# Patient Record
Sex: Female | Born: 1990 | Smoking: Current every day smoker
Health system: Southern US, Academic
[De-identification: ages and names within clinical notes are randomized; demographics above are authoritative.]

## PROBLEM LIST (undated history)

## (undated) DIAGNOSIS — K219 Gastro-esophageal reflux disease without esophagitis: Secondary | ICD-10-CM

## (undated) DIAGNOSIS — F1110 Opioid abuse, uncomplicated: Secondary | ICD-10-CM

## (undated) DIAGNOSIS — D509 Iron deficiency anemia, unspecified: Secondary | ICD-10-CM

## (undated) DIAGNOSIS — F429 Obsessive-compulsive disorder, unspecified: Secondary | ICD-10-CM

## (undated) DIAGNOSIS — F32A Depression, unspecified: Secondary | ICD-10-CM

## (undated) DIAGNOSIS — F1990 Other psychoactive substance use, unspecified, uncomplicated: Secondary | ICD-10-CM

## (undated) DIAGNOSIS — F329 Major depressive disorder, single episode, unspecified: Secondary | ICD-10-CM

## (undated) DIAGNOSIS — B1920 Unspecified viral hepatitis C without hepatic coma: Secondary | ICD-10-CM

## (undated) DIAGNOSIS — E109 Type 1 diabetes mellitus without complications: Secondary | ICD-10-CM

## (undated) DIAGNOSIS — R011 Cardiac murmur, unspecified: Secondary | ICD-10-CM

## (undated) HISTORY — DX: Type 1 diabetes mellitus without complications (CMS HCC): E10.9

## (undated) HISTORY — DX: Cardiac murmur, unspecified: R01.1

---
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
## 2008-01-05 ENCOUNTER — Ambulatory Visit: Payer: Self-pay | Admitting: "Endocrinology

## 2008-04-11 ENCOUNTER — Ambulatory Visit: Payer: Self-pay | Admitting: "Endocrinology

## 2008-08-11 ENCOUNTER — Ambulatory Visit: Payer: Self-pay | Admitting: "Endocrinology

## 2008-08-15 ENCOUNTER — Emergency Department (HOSPITAL_COMMUNITY): Admission: EM | Admit: 2008-08-15 | Discharge: 2008-08-15 | Payer: Self-pay | Admitting: Emergency Medicine

## 2009-03-14 ENCOUNTER — Ambulatory Visit: Payer: Self-pay | Admitting: Occupational Medicine

## 2009-03-14 DIAGNOSIS — E1065 Type 1 diabetes mellitus with hyperglycemia: Secondary | ICD-10-CM | POA: Insufficient documentation

## 2009-03-14 DIAGNOSIS — E1069 Type 1 diabetes mellitus with other specified complication: Secondary | ICD-10-CM

## 2009-03-14 DIAGNOSIS — F32A Depression, unspecified: Secondary | ICD-10-CM | POA: Insufficient documentation

## 2009-03-14 DIAGNOSIS — F329 Major depressive disorder, single episode, unspecified: Secondary | ICD-10-CM | POA: Insufficient documentation

## 2009-03-14 DIAGNOSIS — R209 Unspecified disturbances of skin sensation: Secondary | ICD-10-CM

## 2009-03-14 DIAGNOSIS — I10 Essential (primary) hypertension: Secondary | ICD-10-CM | POA: Insufficient documentation

## 2009-03-14 DIAGNOSIS — S93409A Sprain of unspecified ligament of unspecified ankle, initial encounter: Secondary | ICD-10-CM | POA: Insufficient documentation

## 2010-01-13 ENCOUNTER — Emergency Department (HOSPITAL_COMMUNITY)
Admission: EM | Admit: 2010-01-13 | Discharge: 2010-01-13 | Payer: Self-pay | Source: Home / Self Care | Admitting: Emergency Medicine

## 2010-01-15 ENCOUNTER — Emergency Department (HOSPITAL_COMMUNITY)
Admission: EM | Admit: 2010-01-15 | Discharge: 2010-01-15 | Payer: Self-pay | Source: Home / Self Care | Admitting: Emergency Medicine

## 2010-01-18 ENCOUNTER — Ambulatory Visit: Payer: Self-pay | Admitting: "Endocrinology

## 2010-02-27 NOTE — Assessment & Plan Note (Signed)
Summary: LEFT FOOT NUMB/KH   Vital Signs:  Patient Profile:   20 Years Old Female CC:      right ankle injury and left foor numbness Height:     67 inches Weight:      178 pounds O2 Sat:      100 % O2 treatment:    Room Air Temp:     97.3 degrees F oral Pulse rate:   92 / minute Pulse rhythm:   regular Resp:     18 per minute BP sitting:   133 / 84  (right arm)  Pt. in pain?   yes    Location:   right ankle    Intensity:   4    Type:       sharp  Vitals Entered By: Lajean Saver, RN                   Updated Prior Medication List: LEXAPRO 20 MG TABS (ESCITALOPRAM OXALATE) once daily ABILIFY 2 MG TABS (ARIPIPRAZOLE) once daily ORTHO TRI-CYCLEN (28) 0.18/0.215/0.25 MG-35 MCG TABS (NORGESTIM-ETH ESTRAD TRIPHASIC) once daily VYVANSE 60 MG CAPS (LISDEXAMFETAMINE DIMESYLATE) once daily OMEPRAZOLE 40 MG CPDR (OMEPRAZOLE) once daily LISINOPRIL 20 MG TABS (LISINOPRIL) once daily APIDRA 100 UNIT/ML SOLN (INSULIN GLULISINE)   Current Allergies: No known allergies History of Present Illness Chief Complaint: right ankle injury and left foor numbness History of Present Illness: Presents with complaints of numbness on the plantar surface of her left loot for three days.  No complaints of pain.   She does have a tingling feeling on the dorsum of her left foot though.    Says that she was sitting for a couple of hours with her legs crossed for a couple of hours and later noted the left foot numbness.   Initally the entire foor was numb, but the feeling the the dorsum of the foot has returned.   As a result of the left foot numbness, she accidentally sprained her right ankle.   Right ankle is mildly sore and swollen.  She is able to walk.   She has not tried any ibuprofen and has not wrapped her ankle.    REVIEW OF SYSTEMS Constitutional Symptoms      Denies fever, chills, night sweats, weight loss, weight gain, and fatigue.  Eyes       Denies change in vision, eye pain, eye  discharge, glasses, contact lenses, and eye surgery. Ear/Nose/Throat/Mouth       Denies hearing loss/aids, change in hearing, ear pain, ear discharge, dizziness, frequent runny nose, frequent nose bleeds, sinus problems, sore throat, hoarseness, and tooth pain or bleeding.  Respiratory       Denies dry cough, productive cough, wheezing, shortness of breath, asthma, bronchitis, and emphysema/COPD.  Cardiovascular       Denies murmurs, chest pain, and tires easily with exhertion.    Gastrointestinal       Denies stomach pain, nausea/vomiting, diarrhea, constipation, blood in bowel movements, and indigestion. Genitourniary       Denies painful urination, kidney stones, and loss of urinary control. Neurological       Complains of numbness and tingling.      Denies paralysis, seizures, and fainting/blackouts.      Comments: left foot Musculoskeletal       Complains of muscle pain, joint pain, decreased range of motion, and swelling.      Denies joint stiffness, redness, muscle weakness, and gout.      Comments: right ankle Skin  Denies bruising, unusual mles/lumps or sores, and hair/skin or nail changes.  Psych       Denies mood changes, temper/anger issues, anxiety/stress, speech problems, depression, and sleep problems.  Past History:  Past Medical History: ADD OCD Depression Diabetes mellitus, type I Hypertension  Past Surgical History: Denies surgical history  Social History: Current Smoker- few cigs/day Alcohol use-yes Drug use-no Smoking Status:  current Drug Use:  no Physical Exam General appearance: well developed, well nourished, no acute distress Chest/Lungs: no rales, wheezes, or rhonchi bilateral, breath sounds equal without effort Heart: regular rate and  rhythm, no murmur Right ankle: mildly swollen.  Minimal tenderness in lateral malloelus.  Stable.  Able to walk withou a limp Left foot.    decreased sensation on the plantar surface.  Intact sensation  dorsally   Good peripheral pulses.  Good cap refill.   Assessment New Problems: PARESTHESIA (ICD-782.0) ANKLE SPRAIN, RIGHT (ICD-845.00) HYPERTENSION (ICD-401.9) DIABETES MELLITUS, TYPE I (ICD-250.01) DEPRESSION (ICD-311)   Plan New Orders: Est. Patient Level III [99213] Planning Comments:   I think the pressure of prolonged sitting gave her a temporary compression neuropathy tot he left foot Advised waiting and watching.    Recommend follow up with PCP if sensation does not return in 2 weeks ACE wrap to right ankle Ibuprfen as needed.   The patient and/or caregiver has been counseled thoroughly with regard to medications prescribed including dosage, schedule, interactions, rationale for use, and possible side effects and they verbalize understanding.  Diagnoses and expected course of recovery discussed and will return if not improved as expected or if the condition worsens. Patient and/or caregiver verbalized understanding.

## 2010-04-09 LAB — WOUND CULTURE

## 2010-05-02 ENCOUNTER — Emergency Department (HOSPITAL_COMMUNITY)
Admission: EM | Admit: 2010-05-02 | Discharge: 2010-05-03 | Disposition: A | Discharge status: Home / Self Care | Payer: Self-pay | Attending: Emergency Medicine | Admitting: Emergency Medicine

## 2010-05-02 DIAGNOSIS — R509 Fever, unspecified: Secondary | ICD-10-CM | POA: Insufficient documentation

## 2010-05-02 DIAGNOSIS — M542 Cervicalgia: Secondary | ICD-10-CM | POA: Insufficient documentation

## 2010-05-02 DIAGNOSIS — D72819 Decreased white blood cell count, unspecified: Secondary | ICD-10-CM | POA: Insufficient documentation

## 2010-05-02 DIAGNOSIS — R000 Tachycardia, unspecified: Secondary | ICD-10-CM | POA: Insufficient documentation

## 2010-05-02 DIAGNOSIS — B9789 Other viral agents as the cause of diseases classified elsewhere: Secondary | ICD-10-CM | POA: Insufficient documentation

## 2010-05-02 DIAGNOSIS — IMO0001 Reserved for inherently not codable concepts without codable children: Secondary | ICD-10-CM | POA: Insufficient documentation

## 2010-05-02 DIAGNOSIS — R51 Headache: Secondary | ICD-10-CM | POA: Insufficient documentation

## 2010-05-02 DIAGNOSIS — R112 Nausea with vomiting, unspecified: Secondary | ICD-10-CM | POA: Insufficient documentation

## 2010-05-02 DIAGNOSIS — Z8619 Personal history of other infectious and parasitic diseases: Secondary | ICD-10-CM | POA: Insufficient documentation

## 2010-05-02 DIAGNOSIS — E119 Type 2 diabetes mellitus without complications: Secondary | ICD-10-CM | POA: Insufficient documentation

## 2010-05-02 DIAGNOSIS — R42 Dizziness and giddiness: Secondary | ICD-10-CM | POA: Insufficient documentation

## 2010-05-02 DIAGNOSIS — R6883 Chills (without fever): Secondary | ICD-10-CM | POA: Insufficient documentation

## 2010-05-02 DIAGNOSIS — Z794 Long term (current) use of insulin: Secondary | ICD-10-CM | POA: Insufficient documentation

## 2010-05-02 LAB — CBC
MCH: 30 pg (ref 26.0–34.0)
WBC: 2.1 10*3/uL — ABNORMAL LOW (ref 4.0–10.5)

## 2010-05-03 ENCOUNTER — Emergency Department (HOSPITAL_COMMUNITY): Payer: Self-pay

## 2010-05-03 LAB — CSF CELL COUNT WITH DIFFERENTIAL
RBC Count, CSF: 22 /mm3 — ABNORMAL HIGH
Tube #: 4
WBC, CSF: 1 /mm3 (ref 0–5)
WBC, CSF: 3 /mm3 (ref 0–5)

## 2010-05-03 LAB — DIFFERENTIAL
Basophils Relative: 0 % (ref 0–1)
Eosinophils Absolute: 0 10*3/uL (ref 0.0–0.7)
Eosinophils Relative: 1 % (ref 0–5)
Lymphocytes Relative: 64 % — ABNORMAL HIGH (ref 12–46)
Lymphs Abs: 1.4 10*3/uL (ref 0.7–4.0)
Monocytes Absolute: 0 10*3/uL — ABNORMAL LOW (ref 0.1–1.0)
Monocytes Relative: 0 % — ABNORMAL LOW (ref 3–12)
Neutro Abs: 0.7 10*3/uL — ABNORMAL LOW (ref 1.7–7.7)
Neutrophils Relative %: 35 % — ABNORMAL LOW (ref 43–77)
Smear Review: ADEQUATE

## 2010-05-03 LAB — URINALYSIS, ROUTINE W REFLEX MICROSCOPIC
Ketones, ur: 15 mg/dL — AB
Urobilinogen, UA: 0.2 mg/dL (ref 0.0–1.0)
pH: 6 (ref 5.0–8.0)

## 2010-05-03 LAB — BASIC METABOLIC PANEL
BUN: 14 mg/dL (ref 6–23)
CO2: 25 mEq/L (ref 19–32)
GFR calc non Af Amer: >60 mL/min (ref >60)
Potassium: 3.4 mEq/L — ABNORMAL LOW (ref 3.5–5.1)
Sodium: 136 mEq/L (ref 135–145)

## 2010-05-03 LAB — GLUCOSE, CAPILLARY
Glucose-Capillary: 101 mg/dL — ABNORMAL HIGH (ref 70–99)
Glucose-Capillary: 89 mg/dL (ref 70–99)

## 2010-05-03 LAB — GRAM STAIN

## 2010-05-05 ENCOUNTER — Emergency Department (HOSPITAL_COMMUNITY): Payer: Self-pay

## 2010-05-05 ENCOUNTER — Emergency Department (HOSPITAL_COMMUNITY)
Admission: EM | Admit: 2010-05-05 | Discharge: 2010-05-05 | Disposition: A | Discharge status: Home / Self Care | Payer: Self-pay | Attending: Emergency Medicine | Admitting: Emergency Medicine

## 2010-05-05 DIAGNOSIS — E119 Type 2 diabetes mellitus without complications: Secondary | ICD-10-CM | POA: Insufficient documentation

## 2010-05-05 DIAGNOSIS — G971 Other reaction to spinal and lumbar puncture: Secondary | ICD-10-CM | POA: Insufficient documentation

## 2010-05-05 DIAGNOSIS — R079 Chest pain, unspecified: Secondary | ICD-10-CM | POA: Insufficient documentation

## 2010-05-05 DIAGNOSIS — Y929 Unspecified place or not applicable: Secondary | ICD-10-CM | POA: Insufficient documentation

## 2010-05-05 DIAGNOSIS — Y844 Aspiration of fluid as the cause of abnormal reaction of the patient, or of later complication, without mention of misadventure at the time of the procedure: Secondary | ICD-10-CM | POA: Insufficient documentation

## 2010-05-06 LAB — POCT I-STAT 3, VENOUS BLOOD GAS (G3P V)
Acid-base deficit: 10 mmol/L — ABNORMAL HIGH (ref 0.0–2.0)
Bicarbonate: 16.1 meq/L — ABNORMAL LOW (ref 20.0–24.0)
O2 Saturation: 82 %
TCO2: 17 mmol/L (ref 0–100)
pCO2, Ven: 36.7 mmHg — ABNORMAL LOW (ref 45.0–50.0)
pH, Ven: 7.251 (ref 7.250–7.300)
pO2, Ven: 53 mmHg — ABNORMAL HIGH (ref 30.0–45.0)

## 2010-05-06 LAB — CSF CULTURE W GRAM STAIN

## 2010-05-06 LAB — GLUCOSE, CAPILLARY
Glucose-Capillary: 250 mg/dL — ABNORMAL HIGH (ref 70–99)
Glucose-Capillary: >600 mg/dL (ref 70–99)

## 2010-05-06 LAB — POCT I-STAT, CHEM 8
BUN: 22 mg/dL (ref 6–23)
Calcium, Ion: 1.15 mmol/L (ref 1.12–1.32)
Chloride: 100 mEq/L (ref 96–112)
Creatinine, Ser: 0.9 mg/dL (ref 0.4–1.2)
Glucose, Bld: >700 mg/dL (ref 70–99)
HCT: 41 % (ref 36.0–46.0)
Hemoglobin: 13.9 g/dL (ref 12.0–15.0)
Potassium: 4.8 mEq/L (ref 3.5–5.1)
Sodium: 129 meq/L — ABNORMAL LOW (ref 135–145)
TCO2: 14 mmol/L (ref 0–100)

## 2010-05-06 LAB — POCT PREGNANCY, URINE: Preg Test, Ur: NEGATIVE

## 2010-05-06 LAB — URINALYSIS, ROUTINE W REFLEX MICROSCOPIC
Hgb urine dipstick: NEGATIVE
Leukocytes, UA: NEGATIVE
Nitrite: NEGATIVE
Urobilinogen, UA: 0.2 mg/dL (ref 0.0–1.0)

## 2010-05-06 LAB — URINE MICROSCOPIC-ADD ON

## 2010-05-06 LAB — GLUCOSE, RANDOM
Glucose, Bld: 579 mg/dL (ref 70–99)
Glucose, Bld: 809 mg/dL (ref 70–99)

## 2010-05-09 LAB — CULTURE, BLOOD (ROUTINE X 2)
Culture  Setup Time: 201204050508
Culture: NO GROWTH
Culture: NO GROWTH

## 2010-05-22 ENCOUNTER — Ambulatory Visit (INDEPENDENT_AMBULATORY_CARE_PROVIDER_SITE_OTHER): Payer: Self-pay | Admitting: "Endocrinology

## 2010-05-22 DIAGNOSIS — E049 Nontoxic goiter, unspecified: Secondary | ICD-10-CM

## 2010-05-22 DIAGNOSIS — E1065 Type 1 diabetes mellitus with hyperglycemia: Secondary | ICD-10-CM

## 2010-05-22 DIAGNOSIS — E1142 Type 2 diabetes mellitus with diabetic polyneuropathy: Secondary | ICD-10-CM

## 2010-05-22 DIAGNOSIS — E1069 Type 1 diabetes mellitus with other specified complication: Secondary | ICD-10-CM

## 2010-05-27 ENCOUNTER — Emergency Department (HOSPITAL_COMMUNITY)
Admission: EM | Admit: 2010-05-27 | Discharge: 2010-05-28 | Disposition: A | Discharge status: Home / Self Care | Payer: Self-pay | Attending: Emergency Medicine | Admitting: Emergency Medicine

## 2010-05-27 DIAGNOSIS — Z760 Encounter for issue of repeat prescription: Secondary | ICD-10-CM | POA: Insufficient documentation

## 2010-05-27 DIAGNOSIS — Z794 Long term (current) use of insulin: Secondary | ICD-10-CM | POA: Insufficient documentation

## 2010-05-27 DIAGNOSIS — E119 Type 2 diabetes mellitus without complications: Secondary | ICD-10-CM | POA: Insufficient documentation
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
## 2010-06-15 NOTE — Discharge Summary (Signed)
NAME:  Anna Dennis, Anna Dennis NO.:  [id number]   MEDICAL RECORD NO.:  [id number]          PATIENT TYPE:  INP   LOCATION:  6126                         FACILITY:  [facility]   PHYSICIAN:  Rolm Gala, M.D.    DATE OF BIRTH:  07-27-1990   DATE OF ADMISSION:  06/16/2005  DATE OF DISCHARGE:  06/18/2005                                 DISCHARGE SUMMARY   DISCHARGE DIAGNOSES:  Insulin dependent diabetes.   MEDICATIONS:  1.  Lantus 38 units q.h.s.  2.  NovoLog sliding scale insulin.   OPERATIVE PROCEDURES:  Insulin drip.   HOSPITAL COURSE:  The patient is a 20 year old female, who was in good  control of her diabetes, but had stopped taking her insulin and went into  DKA.  She was initially put on an insulin drip, given IV fluids, and then  was switched to her Lantus.  The patient came out of the PICU on 06/17/2005.  She was ketones-negative and her potassium and sodium were within normal  limits.  The patient was given sliding scale insulin and Dr. Fransico Michael was  following her and her Lantus was continued at 38 units q.h.s.  The patient  was discharged on her home insulin medication after diabetic teaching.   FOLLOWUP:  The patient will follow up with Dr. Fransico Michael and her doctor at  [facility].  She will also follow up with family services at [facility] weekly and at  the [facility].   DISCHARGE LABS:  Sodium 140, potassium 3.2, chloride 115, bicarb 19, BUN 14,  creatinine 0.7, glucose 197, calcium 8.2, anion gap 6.      Rolm Gala, M.D.     Anna Dennis  D:  06/18/2005  T:  06/18/2005  Job:  [id number]

## 2010-06-15 NOTE — Consult Note (Signed)
NAME:  Anna Dennis, Anna Dennis NO.:  [id number]   MEDICAL RECORD NO.:  [id number]          PATIENT TYPE:  INP   LOCATION:  6126                         FACILITY:  [facility]   PHYSICIAN:  David Stall, M.D.DATE OF BIRTH:  May 07, 1990   DATE OF CONSULTATION:  06/17/2005  DATE OF DISCHARGE:                                   CONSULTATION   PEDIATRIC ENDOCRINE CONSULTATION:   CHIEF COMPLAINT:  Please assist in the management of this patient, known to  you with type 1 diabetes and new episode of diabetic ketoacidosis.   Dictation ended at this point.           ______________________________  David Stall, M.D.     MJB/MEDQ  D:  06/18/2005  T:  06/18/2005  Job:  [id number]

## 2010-07-13 ENCOUNTER — Encounter: Payer: Self-pay | Admitting: Pediatrics

## 2010-09-19 ENCOUNTER — Ambulatory Visit: Payer: Self-pay | Admitting: "Endocrinology

## 2010-10-22 LAB — HEPATIC FUNCTION PANEL
Albumin: 3.5
Bilirubin, Direct: <0.1
Total Bilirubin: 0.9

## 2010-10-22 LAB — LIPID PANEL
HDL: 39
LDL Cholesterol: 45
Total CHOL/HDL Ratio: 2.5
Triglycerides: 64
VLDL: 13

## 2010-10-22 LAB — URINALYSIS, ROUTINE W REFLEX MICROSCOPIC
Nitrite: NEGATIVE
Protein, ur: NEGATIVE
Urobilinogen, UA: 0.2

## 2010-10-22 LAB — BENZODIAZEPINE, QUANTITATIVE, URINE: Alprazolam (GC/LC/MS), ur confirm: 220 ng/mL

## 2010-10-22 LAB — CBC
MCHC: 34.1
MCV: 87.3
RBC: 3.95

## 2010-10-22 LAB — BASIC METABOLIC PANEL
BUN: 10
CO2: 28
Chloride: 105
Creatinine, Ser: 0.7
GFR calc Af Amer: >60

## 2010-10-22 LAB — DRUGS OF ABUSE SCREEN W/O ALC, ROUTINE URINE
Amphetamine Screen, Ur: NEGATIVE
Benzodiazepines.: POSITIVE — AB
Creatinine,U: 230.9
Methadone: NEGATIVE
Phencyclidine (PCP): NEGATIVE

## 2010-10-22 LAB — DIFFERENTIAL
Basophils Relative: 0
Eosinophils Absolute: 0.1
Eosinophils Relative: 2
Monocytes Relative: 6
Neutrophils Relative %: 53

## 2010-10-22 LAB — GAMMA GT: GGT: 16

## 2010-10-22 LAB — GC/CHLAMYDIA PROBE AMP, URINE: Chlamydia, Swab/Urine, PCR: NEGATIVE

## 2010-12-03 ENCOUNTER — Emergency Department (HOSPITAL_COMMUNITY)
Admission: EM | Admit: 2010-12-03 | Discharge: 2010-12-03 | Discharge status: Against Medical Advice | Payer: Self-pay | Attending: Emergency Medicine | Admitting: Emergency Medicine

## 2010-12-03 ENCOUNTER — Encounter (HOSPITAL_COMMUNITY): Payer: Self-pay

## 2010-12-03 ENCOUNTER — Emergency Department (HOSPITAL_COMMUNITY): Payer: Self-pay

## 2010-12-03 ENCOUNTER — Emergency Department (HOSPITAL_COMMUNITY)
Admission: EM | Admit: 2010-12-03 | Discharge: 2010-12-04 | Disposition: A | Discharge status: Home / Self Care | Payer: Self-pay | Attending: Emergency Medicine | Admitting: Emergency Medicine

## 2010-12-03 ENCOUNTER — Encounter: Payer: Self-pay | Admitting: *Deleted

## 2010-12-03 DIAGNOSIS — E119 Type 2 diabetes mellitus without complications: Secondary | ICD-10-CM | POA: Insufficient documentation

## 2010-12-03 DIAGNOSIS — R112 Nausea with vomiting, unspecified: Secondary | ICD-10-CM | POA: Insufficient documentation

## 2010-12-03 DIAGNOSIS — B1920 Unspecified viral hepatitis C without hepatic coma: Secondary | ICD-10-CM | POA: Insufficient documentation

## 2010-12-03 DIAGNOSIS — E109 Type 1 diabetes mellitus without complications: Secondary | ICD-10-CM

## 2010-12-03 DIAGNOSIS — M545 Low back pain, unspecified: Secondary | ICD-10-CM | POA: Insufficient documentation

## 2010-12-03 DIAGNOSIS — Z794 Long term (current) use of insulin: Secondary | ICD-10-CM | POA: Insufficient documentation

## 2010-12-03 DIAGNOSIS — R109 Unspecified abdominal pain: Secondary | ICD-10-CM | POA: Insufficient documentation

## 2010-12-03 DIAGNOSIS — N201 Calculus of ureter: Secondary | ICD-10-CM | POA: Insufficient documentation

## 2010-12-03 DIAGNOSIS — Z9641 Presence of insulin pump (external) (internal): Secondary | ICD-10-CM | POA: Insufficient documentation

## 2010-12-03 DIAGNOSIS — Z8619 Personal history of other infectious and parasitic diseases: Secondary | ICD-10-CM | POA: Insufficient documentation

## 2010-12-03 DIAGNOSIS — N200 Calculus of kidney: Secondary | ICD-10-CM

## 2010-12-03 HISTORY — DX: Unspecified viral hepatitis C without hepatic coma: B19.20

## 2010-12-03 LAB — DIFFERENTIAL
Basophils Absolute: 0 K/uL (ref 0.0–0.1)
Basophils Relative: 0 % (ref 0–1)
Eosinophils Absolute: 0 K/uL (ref 0.0–0.7)
Eosinophils Relative: 0 % (ref 0–5)
Lymphocytes Relative: 6 % — ABNORMAL LOW (ref 12–46)
Lymphs Abs: 1 K/uL (ref 0.7–4.0)
Monocytes Absolute: 0.9 K/uL (ref 0.1–1.0)
Monocytes Relative: 5 % (ref 3–12)
Neutro Abs: 16.7 K/uL — ABNORMAL HIGH (ref 1.7–7.7)
Neutrophils Relative %: 90 % — ABNORMAL HIGH (ref 43–77)

## 2010-12-03 LAB — URINE MICROSCOPIC-ADD ON

## 2010-12-03 LAB — BASIC METABOLIC PANEL WITH GFR
BUN: 12 mg/dL (ref 6–23)
CO2: 20 meq/L (ref 19–32)
Calcium: 9.4 mg/dL (ref 8.4–10.5)
Chloride: 96 meq/L (ref 96–112)
Creatinine, Ser: 0.81 mg/dL (ref 0.50–1.10)
GFR calc Af Amer: >90 mL/min
GFR calc non Af Amer: >90 mL/min
Glucose, Bld: 488 mg/dL — ABNORMAL HIGH (ref 70–99)
Potassium: 4.1 meq/L (ref 3.5–5.1)
Sodium: 133 meq/L — ABNORMAL LOW (ref 135–145)

## 2010-12-03 LAB — WET PREP, GENITAL
Trich, Wet Prep: NONE SEEN
Yeast Wet Prep HPF POC: NONE SEEN

## 2010-12-03 LAB — CBC
HCT: 35.5 % — ABNORMAL LOW (ref 36.0–46.0)
Hemoglobin: 12.1 g/dL (ref 12.0–15.0)
MCH: 29.4 pg (ref 26.0–34.0)
MCHC: 34.1 g/dL (ref 30.0–36.0)
MCV: 86.4 fL (ref 78.0–100.0)
Platelets: 304 K/uL (ref 150–400)
RBC: 4.11 MIL/uL (ref 3.87–5.11)
RDW: 13 % (ref 11.5–15.5)
WBC: 18.6 K/uL — ABNORMAL HIGH (ref 4.0–10.5)

## 2010-12-03 LAB — URINALYSIS, ROUTINE W REFLEX MICROSCOPIC
Bilirubin Urine: NEGATIVE
Hgb urine dipstick: NEGATIVE
Protein, ur: NEGATIVE mg/dL
Urobilinogen, UA: 0.2 mg/dL (ref 0.0–1.0)

## 2010-12-03 MED ORDER — ONDANSETRON HCL 4 MG/2ML IJ SOLN
4.0000 mg | Freq: Once | INTRAMUSCULAR | Status: AC
  Administered 2010-12-03: 4 mg via INTRAVENOUS
  Filled 2010-12-03: qty 2

## 2010-12-03 MED ORDER — SODIUM CHLORIDE 0.9 % IV SOLN
Freq: Once | INTRAVENOUS | Status: AC
  Administered 2010-12-03: 21:00:00 via INTRAVENOUS

## 2010-12-03 MED ORDER — OXYCODONE-ACETAMINOPHEN 5-325 MG PO TABS
2.0000 | ORAL_TABLET | Freq: Once | ORAL | Status: AC
  Administered 2010-12-03: 2 via ORAL
  Filled 2010-12-03: qty 2

## 2010-12-03 MED ORDER — INSULIN REGULAR HUMAN 100 UNIT/ML IJ SOLN
10.0000 [IU] | Freq: Once | INTRAMUSCULAR | Status: AC
  Administered 2010-12-03: 10 [IU] via INTRAVENOUS

## 2010-12-03 MED ORDER — KETOROLAC TROMETHAMINE 30 MG/ML IJ SOLN
30.0000 mg | Freq: Once | INTRAMUSCULAR | Status: AC
  Administered 2010-12-03: 30 mg via INTRAVENOUS
  Filled 2010-12-03: qty 1

## 2010-12-03 MED ORDER — INSULIN ASPART 100 UNIT/ML ~~LOC~~ SOLN
SUBCUTANEOUS | Status: AC
  Filled 2010-12-03: qty 3

## 2010-12-03 MED ORDER — HYDROMORPHONE HCL PF 1 MG/ML IJ SOLN
0.5000 mg | Freq: Once | INTRAMUSCULAR | Status: AC
  Administered 2010-12-03: 0.5 mg via INTRAVENOUS
  Filled 2010-12-03: qty 1

## 2010-12-03 MED ORDER — ONDANSETRON HCL 4 MG/2ML IJ SOLN
INTRAMUSCULAR | Status: AC
  Administered 2010-12-03: 21:00:00
  Filled 2010-12-03: qty 2

## 2010-12-03 NOTE — ED Notes (Signed)
Pt was at [facility], left due to wait time. Came to [facility] inquired about wait time then left, went to [facility] and called EMS.

## 2010-12-03 NOTE — ED Notes (Signed)
Pt with c/o right flank pain radiating to RLQ abdomen x 4 hours. Pt reports n/v, denies diarrhea.

## 2010-12-03 NOTE — ED Notes (Signed)
C/o rt flank pain since this am.  She also has nv.  C/o severe pain at present

## 2010-12-03 NOTE — ED Notes (Signed)
Pt requesting something to drink. Informed pt that she can not have anything to eat or drink until her CT has resulted.

## 2010-12-03 NOTE — ED Notes (Signed)
Patient is resting comfortably. No distress noted. ABC's intact.

## 2010-12-03 NOTE — ED Provider Notes (Signed)
History     CSN: [id number] Arrival date & time: 12/03/2010  8:08 PM   None     Chief Complaint  Patient presents with  . Abdominal Pain    accompained by n/v  . Flank Pain    Started at 1600    (Consider location/radiation/quality/duration/timing/severity/associated sxs/prior treatment) HPI Comments: Patient presents with complaints of right lower back pain that has progressively gotten worse and now is radiating to right side of her abdomen.  Is a diabetic, reports one previous episode of DKA but states that the pain is not like this.  She reports no previous history of kidney stone, ovarian cyst, pregnancy and states has never had pain like this in the past.  Patient is a 20 y.o. female presenting with abdominal pain and flank pain. The history is provided by the patient. No language interpreter was used.  Abdominal Pain The primary symptoms of the illness include abdominal pain, nausea and vomiting. The primary symptoms of the illness do not include fever, shortness of breath, diarrhea, hematemesis, hematochezia, dysuria, vaginal discharge or vaginal bleeding. The current episode started 13 to 24 hours ago. The onset of the illness was sudden. The problem has been rapidly worsening.  The patient states that she believes she is currently not pregnant. The patient has not had a change in bowel habit. Additional symptoms associated with the illness include anorexia and back pain. Symptoms associated with the illness do not include chills, heartburn, constipation, urgency, hematuria or frequency. Significant associated medical issues do not include PUD, GERD, inflammatory bowel disease, gallstones, substance abuse, diverticulitis or HIV.  Flank Pain Associated symptoms include abdominal pain, anorexia, nausea and vomiting. Pertinent negatives include no arthralgias, chills or fever.    Past Medical History  Diagnosis Date  . Diabetes mellitus     Insulin pump  . Hepatitis C     No  past surgical history on file.  No family history on file.  History  Substance Use Topics  . Smoking status: Current Everyday Smoker  . Smokeless tobacco: Not on file  . Alcohol Use: No    OB History    Grav Para Term Preterm Abortions TAB SAB Ect Mult Living                  Review of Systems  Constitutional: Negative.  Negative for fever and chills.  HENT: Negative.   Eyes: Negative.   Respiratory: Negative.  Negative for shortness of breath.   Cardiovascular: Negative.   Gastrointestinal: Positive for nausea, vomiting, abdominal pain and anorexia. Negative for heartburn, diarrhea, constipation, blood in stool, hematochezia, abdominal distention, rectal pain and hematemesis.  Genitourinary: Positive for flank pain. Negative for dysuria, urgency, frequency, hematuria, vaginal bleeding, vaginal discharge, difficulty urinating, menstrual problem and pelvic pain.  Musculoskeletal: Positive for back pain. Negative for arthralgias.  Neurological: Negative.   Hematological: Negative.   Psychiatric/Behavioral: Negative.     Allergies  Review of patient's allergies indicates no known allergies.  Home Medications   Current Outpatient Rx  Name Route Sig Dispense Refill  . LEXAPRO PO Oral Take by mouth.      . INSULIN GLULISINE 100 UNIT/ML IJ SOLN Subcutaneous Inject into the skin 3 (three) times daily before meals.        BP 149/94  Pulse 77  Temp(Src) 97.9 F (36.6 C) (Oral)  Resp 20  SpO2 98%  Physical Exam  Nursing note and vitals reviewed. Constitutional: She is oriented to person, place, and time. She appears  well-developed and well-nourished.  HENT:  Head: Normocephalic and atraumatic.  Eyes: Pupils are equal, round, and reactive to light.  Neck: Normal range of motion. Neck supple.  Cardiovascular: Normal rate, regular rhythm and normal heart sounds.   Pulmonary/Chest: Effort normal and breath sounds normal.  Abdominal: Soft. She exhibits no distension and no  mass. There is tenderness. There is CVA tenderness. There is no rebound and no guarding.  Musculoskeletal: Normal range of motion.  Neurological: She is alert and oriented to person, place, and time.  Skin: Skin is warm and dry.  Psychiatric: Her behavior is normal. Judgment and thought content normal. Her mood appears anxious.    ED Course  Procedures (including critical care time)   Labs Reviewed  POCT PREGNANCY, URINE  BASIC METABOLIC PANEL  CBC  DIFFERENTIAL  URINALYSIS, ROUTINE W REFLEX MICROSCOPIC  WET PREP, GENITAL  GC/CHLAMYDIA PROBE AMP, GENITAL   No results found.   Right ureteral stone   MDM  Patient initially slightly acidotic with a gap of 19.  She does not look sick and I doubt this to be DKA related, I have repeated the BMP and the gap has now normalized at 16.  She reports good pain control and CT notes 3mm right UVJ stone.  No sign of infection in the urine.  Plan to send this patient home with pain control and follow up with Dr. Annabell Howells with urology.        Izola Price Boody, [place] 12/04/10 [phone]

## 2010-12-04 LAB — BASIC METABOLIC PANEL
BUN: 13 mg/dL (ref 6–23)
CO2: 21 mEq/L (ref 19–32)
GFR calc non Af Amer: >90 mL/min (ref >90)
Glucose, Bld: 334 mg/dL — ABNORMAL HIGH (ref 70–99)
Potassium: 3.9 mEq/L (ref 3.5–5.1)

## 2010-12-04 LAB — GC/CHLAMYDIA PROBE AMP, GENITAL
Chlamydia, DNA Probe: NEGATIVE
GC Probe Amp, Genital: NEGATIVE

## 2010-12-04 MED ORDER — OXYCODONE-ACETAMINOPHEN 5-325 MG PO TABS: 2.0000 | ORAL_TABLET | ORAL | Status: AC | PRN

## 2010-12-04 MED ORDER — ONDANSETRON HCL 4 MG PO TABS: 4.0000 mg | ORAL_TABLET | Freq: Four times a day (QID) | ORAL | Status: AC

## 2010-12-06 NOTE — ED Provider Notes (Signed)
Medical screening examination/treatment/procedure(s) were performed by non-physician practitioner and as supervising physician I was immediately available for consultation/collaboration.   Forbes Cellar, MD 12/06/10 [phone]

## 2012-01-18 ENCOUNTER — Inpatient Hospital Stay (HOSPITAL_COMMUNITY)
Admission: EM | Admit: 2012-01-18 | Discharge: 2012-01-20 | DRG: 639 | Disposition: A | Discharge status: Home / Self Care | Payer: MEDICAID | Attending: Internal Medicine | Admitting: Internal Medicine

## 2012-01-18 ENCOUNTER — Encounter (HOSPITAL_COMMUNITY): Payer: Self-pay | Admitting: Emergency Medicine

## 2012-01-18 DIAGNOSIS — Z794 Long term (current) use of insulin: Secondary | ICD-10-CM

## 2012-01-18 DIAGNOSIS — Z87891 Personal history of nicotine dependence: Secondary | ICD-10-CM

## 2012-01-18 DIAGNOSIS — F32A Depression, unspecified: Secondary | ICD-10-CM | POA: Diagnosis present

## 2012-01-18 DIAGNOSIS — B1920 Unspecified viral hepatitis C without hepatic coma: Secondary | ICD-10-CM | POA: Diagnosis present

## 2012-01-18 DIAGNOSIS — K219 Gastro-esophageal reflux disease without esophagitis: Secondary | ICD-10-CM | POA: Diagnosis present

## 2012-01-18 DIAGNOSIS — I10 Essential (primary) hypertension: Secondary | ICD-10-CM | POA: Diagnosis present

## 2012-01-18 DIAGNOSIS — F329 Major depressive disorder, single episode, unspecified: Secondary | ICD-10-CM | POA: Diagnosis present

## 2012-01-18 DIAGNOSIS — Z9641 Presence of insulin pump (external) (internal): Secondary | ICD-10-CM

## 2012-01-18 DIAGNOSIS — E1110 Type 2 diabetes mellitus with ketoacidosis without coma: Secondary | ICD-10-CM | POA: Diagnosis present

## 2012-01-18 DIAGNOSIS — F429 Obsessive-compulsive disorder, unspecified: Secondary | ICD-10-CM | POA: Diagnosis present

## 2012-01-18 DIAGNOSIS — F3289 Other specified depressive episodes: Secondary | ICD-10-CM | POA: Diagnosis present

## 2012-01-18 DIAGNOSIS — E1010 Type 1 diabetes mellitus with ketoacidosis without coma: Principal | ICD-10-CM | POA: Diagnosis present

## 2012-01-18 HISTORY — DX: Depression, unspecified: F32.A

## 2012-01-18 HISTORY — DX: Iron deficiency anemia, unspecified: D50.9

## 2012-01-18 HISTORY — DX: Gastro-esophageal reflux disease without esophagitis: K21.9

## 2012-01-18 HISTORY — DX: Major depressive disorder, single episode, unspecified: F32.9

## 2012-01-18 HISTORY — DX: Obsessive-compulsive disorder, unspecified: F42.9

## 2012-01-18 LAB — GLUCOSE, CAPILLARY

## 2012-01-18 MED ORDER — SODIUM CHLORIDE 0.9 % IV SOLN
1000.0000 mL | Freq: Once | INTRAVENOUS | Status: DC
  Administered 2012-01-19: 1000 mL via INTRAVENOUS

## 2012-01-18 MED ORDER — SODIUM CHLORIDE 0.9 % IV SOLN
1000.0000 mL | INTRAVENOUS | Status: DC
  Administered 2012-01-19: 1000 mL via INTRAVENOUS

## 2012-01-18 NOTE — ED Notes (Signed)
CBG 561 

## 2012-01-18 NOTE — ED Notes (Signed)
Pt states she is out of insulin. Pt states she feels BS is high because she feels nauseated and thirty. Pt has not been able to see MD or physician for script.

## 2012-01-18 NOTE — ED Notes (Signed)
Pt A & O,PWD. Pt states she has empty insulin pump. Pt usually gets insulin from health department but needs script

## 2012-01-18 NOTE — ED Notes (Signed)
Called for patient multiple time in [place], no answer

## 2012-01-19 DIAGNOSIS — F329 Major depressive disorder, single episode, unspecified: Secondary | ICD-10-CM

## 2012-01-19 DIAGNOSIS — E1110 Type 2 diabetes mellitus with ketoacidosis without coma: Secondary | ICD-10-CM

## 2012-01-19 DIAGNOSIS — F3289 Other specified depressive episodes: Secondary | ICD-10-CM

## 2012-01-19 LAB — URINE MICROSCOPIC-ADD ON

## 2012-01-19 LAB — URINALYSIS, ROUTINE W REFLEX MICROSCOPIC
Glucose, UA: >1000 mg/dL — AB
Leukocytes, UA: NEGATIVE
Nitrite: NEGATIVE
Specific Gravity, Urine: 1.036 — ABNORMAL HIGH (ref 1.005–1.030)
pH: 5.5 (ref 5.0–8.0)

## 2012-01-19 LAB — CBC
Hemoglobin: 11.7 g/dL — ABNORMAL LOW (ref 12.0–15.0)
Hemoglobin: 11.8 g/dL — ABNORMAL LOW (ref 12.0–15.0)
MCH: 29.5 pg (ref 26.0–34.0)
MCV: 88.9 fL (ref 78.0–100.0)
Platelets: 258 10*3/uL (ref 150–400)
RBC: 3.96 MIL/uL (ref 3.87–5.11)
RBC: 4.05 MIL/uL (ref 3.87–5.11)
WBC: 11 10*3/uL — ABNORMAL HIGH (ref 4.0–10.5)
WBC: 15.9 10*3/uL — ABNORMAL HIGH (ref 4.0–10.5)

## 2012-01-19 LAB — BASIC METABOLIC PANEL
BUN: 10 mg/dL (ref 6–23)
BUN: 9 mg/dL (ref 6–23)
CO2: 15 mEq/L — ABNORMAL LOW (ref 19–32)
CO2: 18 mEq/L — ABNORMAL LOW (ref 19–32)
CO2: 20 mEq/L (ref 19–32)
CO2: 23 mEq/L (ref 19–32)
Calcium: 9 mg/dL (ref 8.4–10.5)
Chloride: 103 mEq/L (ref 96–112)
Chloride: 92 mEq/L — ABNORMAL LOW (ref 96–112)
Chloride: 99 mEq/L (ref 96–112)
Chloride: 99 mEq/L (ref 96–112)
Creatinine, Ser: 0.75 mg/dL (ref 0.50–1.10)
Creatinine, Ser: 0.65 mg/dL (ref 0.50–1.10)
Glucose, Bld: 146 mg/dL — ABNORMAL HIGH (ref 70–99)
Glucose, Bld: 244 mg/dL — ABNORMAL HIGH (ref 70–99)
Glucose, Bld: 621 mg/dL (ref 70–99)
Potassium: 4.4 mEq/L (ref 3.5–5.1)
Potassium: 4.4 mEq/L (ref 3.5–5.1)
Potassium: 4.8 mEq/L (ref 3.5–5.1)
Sodium: 129 mEq/L — ABNORMAL LOW (ref 135–145)
Sodium: 132 mEq/L — ABNORMAL LOW (ref 135–145)
Sodium: 133 mEq/L — ABNORMAL LOW (ref 135–145)

## 2012-01-19 LAB — GLUCOSE, CAPILLARY
Glucose-Capillary: 527 mg/dL — ABNORMAL HIGH (ref 70–99)
Glucose-Capillary: 381 mg/dL — ABNORMAL HIGH (ref 70–99)
Glucose-Capillary: 266 mg/dL — ABNORMAL HIGH (ref 70–99)
Glucose-Capillary: 231 mg/dL — ABNORMAL HIGH (ref 70–99)
Glucose-Capillary: 157 mg/dL — ABNORMAL HIGH (ref 70–99)

## 2012-01-19 LAB — PREGNANCY, URINE: Preg Test, Ur: NEGATIVE

## 2012-01-19 MED ORDER — SODIUM CHLORIDE 0.9 % IV BOLUS (SEPSIS)
500.0000 mL | Freq: Once | INTRAVENOUS | Status: AC
  Administered 2012-01-19: 04:00:00 via INTRAVENOUS

## 2012-01-19 MED ORDER — ALUM & MAG HYDROXIDE-SIMETH 200-200-20 MG/5ML PO SUSP: 30.0000 mL | Freq: Four times a day (QID) | ORAL | Status: DC | PRN

## 2012-01-19 MED ORDER — DEXTROSE-NACL 5-0.45 % IV SOLN
INTRAVENOUS | Status: DC
  Administered 2012-01-19: 1000 mL via INTRAVENOUS

## 2012-01-19 MED ORDER — INSULIN ASPART 100 UNIT/ML ~~LOC~~ SOLN: 0.0000 [IU] | Freq: Every day | SUBCUTANEOUS | Status: DC

## 2012-01-19 MED ORDER — INSULIN GLULISINE 100 UNIT/ML IJ SOLN: 32.0000 [IU] | INTRAMUSCULAR | Status: DC

## 2012-01-19 MED ORDER — SODIUM CHLORIDE 0.9 % IV SOLN
INTRAVENOUS | Status: DC
  Administered 2012-01-19 – 2012-01-20 (×3): via INTRAVENOUS

## 2012-01-19 MED ORDER — HYDROCODONE-ACETAMINOPHEN 5-325 MG PO TABS: 1.0000 | ORAL_TABLET | ORAL | Status: DC | PRN

## 2012-01-19 MED ORDER — ACETAMINOPHEN 650 MG RE SUPP: 650.0000 mg | Freq: Four times a day (QID) | RECTAL | Status: DC | PRN

## 2012-01-19 MED ORDER — ENOXAPARIN SODIUM 40 MG/0.4ML ~~LOC~~ SOLN
40.0000 mg | SUBCUTANEOUS | Status: DC
  Administered 2012-01-19 – 2012-01-20 (×2): 40 mg via SUBCUTANEOUS
  Filled 2012-01-19 (×2): qty 0.4

## 2012-01-19 MED ORDER — ACETAMINOPHEN 325 MG PO TABS: 650.0000 mg | ORAL_TABLET | Freq: Four times a day (QID) | ORAL | Status: DC | PRN

## 2012-01-19 MED ORDER — PANTOPRAZOLE SODIUM 20 MG PO TBEC
20.0000 mg | DELAYED_RELEASE_TABLET | Freq: Every day | ORAL | Status: DC
  Administered 2012-01-19 – 2012-01-20 (×2): 20 mg via ORAL
  Filled 2012-01-19 (×2): qty 1

## 2012-01-19 MED ORDER — ONDANSETRON HCL 4 MG/2ML IJ SOLN: 4.0000 mg | Freq: Four times a day (QID) | INTRAMUSCULAR | Status: DC | PRN

## 2012-01-19 MED ORDER — INSULIN ASPART 100 UNIT/ML ~~LOC~~ SOLN
0.0000 [IU] | Freq: Three times a day (TID) | SUBCUTANEOUS | Status: DC
  Administered 2012-01-19: 15 [IU] via SUBCUTANEOUS

## 2012-01-19 MED ORDER — CITALOPRAM HYDROBROMIDE 10 MG PO TABS
10.0000 mg | ORAL_TABLET | Freq: Every day | ORAL | Status: DC
  Administered 2012-01-19 – 2012-01-20 (×2): 10 mg via ORAL
  Filled 2012-01-19 (×2): qty 1

## 2012-01-19 MED ORDER — INSULIN ASPART 100 UNIT/ML ~~LOC~~ SOLN
10.0000 [IU] | Freq: Three times a day (TID) | SUBCUTANEOUS | Status: DC
  Administered 2012-01-19 – 2012-01-20 (×2): 10 [IU] via SUBCUTANEOUS

## 2012-01-19 MED ORDER — HYDROCODONE-ACETAMINOPHEN 5-325 MG PO TABS
1.0000 | ORAL_TABLET | ORAL | Status: DC | PRN
  Administered 2012-01-19: 1 via ORAL
  Filled 2012-01-19: qty 1

## 2012-01-19 MED ORDER — DEXTROSE 50 % IV SOLN: 25.0000 mL | INTRAVENOUS | Status: DC | PRN

## 2012-01-19 MED ORDER — SODIUM CHLORIDE 0.9 % IV BOLUS (SEPSIS): 500.0000 mL | Freq: Once | INTRAVENOUS | Status: DC

## 2012-01-19 MED ORDER — SENNOSIDES-DOCUSATE SODIUM 8.6-50 MG PO TABS
1.0000 | ORAL_TABLET | Freq: Every evening | ORAL | Status: DC | PRN
  Filled 2012-01-19: qty 1

## 2012-01-19 MED ORDER — ONDANSETRON HCL 4 MG/2ML IJ SOLN
4.0000 mg | Freq: Once | INTRAMUSCULAR | Status: AC
  Administered 2012-01-19: 4 mg via INTRAVENOUS
  Filled 2012-01-19: qty 2

## 2012-01-19 MED ORDER — INSULIN ASPART 100 UNIT/ML ~~LOC~~ SOLN
0.0000 [IU] | Freq: Three times a day (TID) | SUBCUTANEOUS | Status: DC
  Administered 2012-01-19: 15 [IU] via SUBCUTANEOUS
  Administered 2012-01-19: 3 [IU] via SUBCUTANEOUS
  Administered 2012-01-20: 11 [IU] via SUBCUTANEOUS

## 2012-01-19 MED ORDER — POTASSIUM CHLORIDE 10 MEQ/100ML IV SOLN
10.0000 meq | INTRAVENOUS | Status: AC
  Administered 2012-01-19 (×2): 10 meq via INTRAVENOUS
  Filled 2012-01-19 (×2): qty 100

## 2012-01-19 MED ORDER — INSULIN GLARGINE 100 UNIT/ML ~~LOC~~ SOLN
10.0000 [IU] | Freq: Once | SUBCUTANEOUS | Status: AC
  Administered 2012-01-19: 10 [IU] via SUBCUTANEOUS

## 2012-01-19 MED ORDER — ONDANSETRON HCL 4 MG PO TABS: 4.0000 mg | ORAL_TABLET | Freq: Four times a day (QID) | ORAL | Status: DC | PRN

## 2012-01-19 MED ORDER — SODIUM CHLORIDE 0.9 % IV SOLN
INTRAVENOUS | Status: DC
  Administered 2012-01-19: 4.7 [IU]/h via INTRAVENOUS
  Filled 2012-01-19: qty 1

## 2012-01-19 MED ORDER — NORGESTIM-ETH ESTRAD TRIPHASIC 0.18/0.215/0.25 MG-35 MCG PO TABS: 1.0000 | ORAL_TABLET | Freq: Every day | ORAL | Status: DC

## 2012-01-19 MED ORDER — INSULIN GLARGINE 100 UNIT/ML ~~LOC~~ SOLN
30.0000 [IU] | Freq: Every day | SUBCUTANEOUS | Status: DC
  Administered 2012-01-19: 30 [IU] via SUBCUTANEOUS

## 2012-01-19 MED ORDER — SODIUM CHLORIDE 0.9 % IV SOLN: INTRAVENOUS | Status: DC

## 2012-01-19 NOTE — Progress Notes (Signed)
UR Completed.  Doak Mah Jane [phone] 01/19/2012  

## 2012-01-19 NOTE — Progress Notes (Addendum)
Dr. Elisabeth Pigeon contacted for pt.'s CBG of 430. Order per phone was to d/c'd dextrose/1/2 NS fluid and replace with NS at 153ml/hr and to give 15 Units of insulin SQ per SS. Pt. C/o fatigue, but is A&O x4. Pt. Was aware of BS being high before fingerstick due to the fatigue. This Clinical research associate to remain at bedside until 2nd CBG is obtained 15 min. After insulin is given.

## 2012-01-19 NOTE — ED Provider Notes (Signed)
History     CSN: [id number]  Arrival date & time 01/18/12  2321   First MD Initiated Contact with Patient 01/18/12 2336      Chief Complaint  Patient presents with  . Hyperglycemia    (Consider location/radiation/quality/duration/timing/severity/associated sxs/prior treatment) The history is provided by the patient.   the patient reports a history of diabetes.  She normally controls her diabetes with an insulin pump but ran out of insulin several days ago for her insulin pump and has been using when necessary insulin subcutaneously.  She now has run out of that and reports feeling nauseated and thirsty.  She does not have a primary care physician and does not have a prescription for insulin.  She denies dysuria but does have urinary frequency.  She is polyuria.  No abdominal pain.  No cough or congestion or shortness of breath.  Her symptoms are mild to moderate in severity.  Nothing worsens or improves her symptoms.  Her symptoms are constant and worsening.  Past Medical History  Diagnosis Date  . Diabetes mellitus     Insulin pump  . Hepatitis C   . Depression   . GERD (gastroesophageal reflux disease)   . OCD (obsessive compulsive disorder)   . Iron deficiency anemia     History reviewed. No pertinent past surgical history.  No family history on file.  History  Substance Use Topics  . Smoking status: Former Games developer  . Smokeless tobacco: Not on file  . Alcohol Use: No    OB History    Grav Para Term Preterm Abortions TAB SAB Ect Mult Living                  Review of Systems  All other systems reviewed and are negative.    Allergies  Review of patient's allergies indicates no known allergies.  Home Medications   Current Outpatient Rx  Name  Route  Sig  Dispense  Refill  . CITALOPRAM HYDROBROMIDE 10 MG PO TABS   Oral   Take 10 mg by mouth daily.         . INSULIN GLULISINE 100 UNIT/ML IJ SOLN   Subcutaneous   Inject 32 Units into the skin continuous.  Sliding scale.Through pump.         . IRON PO   Oral   Take 1 tablet by mouth daily.         Marland Kitchen LANSOPRAZOLE 15 MG PO CPDR   Oral   Take 15 mg by mouth daily.         Marland Kitchen NORGESTIM-ETH ESTRAD TRIPHASIC 0.18/0.215/0.25 MG-35 MCG PO TABS   Oral   Take 1 tablet by mouth daily.           BP 111/48  Pulse 124  Temp 98.3 F (36.8 C) (Oral)  Resp 17  Ht 5\' 7"  (1.702 m)  Wt 172 lb (78.019 kg)  BMI 26.94 kg/m2  SpO2 94%  LMP 01/14/2012  Physical Exam  Nursing note and vitals reviewed. Constitutional: She is oriented to person, place, and time. She appears well-developed and well-nourished. No distress.  HENT:  Head: Normocephalic and atraumatic.  Eyes: EOM are normal.  Neck: Normal range of motion.  Cardiovascular: Regular rhythm and normal heart sounds.        Tachycardia  Pulmonary/Chest: Effort normal and breath sounds normal.  Abdominal: Soft. She exhibits no distension. There is no tenderness.  Musculoskeletal: Normal range of motion.  Neurological: She is alert and oriented to person,  place, and time.  Skin: Skin is warm and dry.  Psychiatric: She has a normal mood and affect. Judgment normal.    ED Course  Procedures (including critical care time)  CRITICAL CARE Performed by: Lyanne Co Total critical care time: 32 Critical care time was exclusive of separately billable procedures and treating other patients. Critical care was necessary to treat or prevent imminent or life-threatening deterioration. Critical care was time spent personally by me on the following activities: development of treatment plan with patient and/or surrogate as well as nursing, discussions with consultants, evaluation of patient's response to treatment, examination of patient, obtaining history from patient or surrogate, ordering and performing treatments and interventions, ordering and review of laboratory studies, ordering and review of radiographic studies, pulse oximetry and  re-evaluation of patient's condition.   Labs Reviewed  GLUCOSE, CAPILLARY - Abnormal; Notable for the following:    Glucose-Capillary 561 (*)     All other components within normal limits  CBC - Abnormal; Notable for the following:    WBC 11.0 (*)     Hemoglobin 11.7 (*)     HCT 35.5 (*)     All other components within normal limits  BASIC METABOLIC PANEL - Abnormal; Notable for the following:    Sodium 129 (*)     Chloride 92 (*)     CO2 18 (*)     Glucose, Bld 621 (*)     All other components within normal limits  URINALYSIS, ROUTINE W REFLEX MICROSCOPIC - Abnormal; Notable for the following:    Specific Gravity, Urine 1.036 (*)     Glucose, UA >1000 (*)     Ketones, ur >80 (*)     All other components within normal limits  PREGNANCY, URINE  URINE MICROSCOPIC-ADD ON   No results found.   1. DKA (diabetic ketoacidoses)       MDM    The patient has evidence of diabetic ketoacidosis.  Her anion gap is 19.  Her heart rate was 133 on arrival.  This is correcting with IV fluids.  The patient be started on an insulin drip.  The patient be admitted to the hospital.      Lyanne Co, MD 01/19/12 [phone]

## 2012-01-19 NOTE — Progress Notes (Signed)
CBG is 378. Dr. Elisabeth Pigeon aware. Pt. Is A&Ox4, steady gait. No c/o discomfort.

## 2012-01-19 NOTE — Progress Notes (Signed)
15-min. CBG was 451. Pt. States she usually waits one hour. Pt. Is A&Ox4, normal skin color, still c/o fatigue. Pt. Informed CBG would be repeated in 45 min. And will leave door open to facilitate frequent checks by staff.

## 2012-01-19 NOTE — ED Notes (Signed)
Pt is an insulin dependent diabetic who uses an insulin pump; pt reports that she does not have a PCP and that she [facility] but that she no longer has a prescription and does not have one to write a prescription for her; pt states that her insulin pump ran out of insulin at 5:30 this pm and that she knew her blood sugar was elevated this pm and wanted to come in for insulin.

## 2012-01-19 NOTE — Progress Notes (Signed)
[facility] PROGRESS NOTE  Anna Dennis ZOX:[id number] DOB: 06-07-1990 DOA: 01/18/2012 PCP: No primary provider on file.  Brief narrative: 21 year old female with history of diabetes on insulin pump who presented with nausea and vomiting 1 day prior to this admission. In ED, lab work up revealed the patient is in DKA. Patient was admitted to SDU for further monitoring and insulin drip. This morning her AG closed and bicarb normalized so we have stopped insulin drip and transitioned to sub Q insulin.  Assessment/Plan:  Principal Problem: *DKA  Patient ran out of insulin for insulin pump and was administering insulin injections based on sliding scale  Now off of insulin drip and transitioned to sub Q insulin  Diet advanced as tolerated  Continue IV fluids  Transfer to med-surg floor   Code Status: full code Family Communication: family not at bedside Disposition Plan: home in am  Manson Passey, MD  [facility] Pager [phone]  If 7PM-7AM, please contact night-coverage [email or web address] Password [email or web address] 01/19/2012, 8:52 AM   LOS: 1 day   Consultants:  Diabetic coordinator  Procedures:  None  Antibiotics:  None   HPI/Subjective: No acute events since admission.  Objective: Filed Vitals:   01/19/12 0400 01/19/12 0500 01/19/12 0600 01/19/12 0800  BP: 113/66   122/64  Pulse: 119 104 98 94  Temp: 98.6 F (37 C)   98.1 F (36.7 C)  TempSrc: Oral   Oral  Resp: 28 16 13 23   Height:      Weight:      SpO2: 98% 96% 96% 100%    Intake/Output Summary (Last 24 hours) at 01/19/12 0852 Last data filed at 01/19/12 0800  Gross per 24 hour  Intake  773.2 ml  Output    590 ml  Net  183.2 ml    Exam:   General:  Pt is alert, follows commands appropriately, not in acute distress  Cardiovascular: Regular rate and rhythm, S1/S2, no murmurs, no rubs, no gallops  Respiratory: Clear to auscultation bilaterally, no wheezing, no crackles, no rhonchi  Abdomen: Soft, non  tender, non distended, bowel sounds present, no guarding  Extremities: No edema, pulses DP and PT palpable bilaterally  Neuro: Grossly nonfocal  Data Reviewed: Basic Metabolic Panel:  Lab 01/19/12 8295 01/19/12 0507 01/19/12 0345 01/19/12 0018  NA 132* 133* 132* 129*  K 4.4 4.4 3.9 4.8  CL 103 102 99 92*  CO2 22 20 15* 18*  GLUCOSE 146* 244* 361* 621*  BUN 10 11 12 14   CREATININE 0.67 0.73 0.75 0.77  CALCIUM 8.5 8.7 8.5 9.0   CBC:  Lab 01/19/12 0507 01/19/12 0018  WBC 15.9* 11.0*  HGB 11.8* 11.7*  HCT 36.0 35.5*  MCV 88.9 89.6  PLT 258 240   CBG:  Lab 01/19/12 0728 01/19/12 0630 01/19/12 0526 01/19/12 0420 01/19/12 0323  GLUCAP 123* 157* 231* 266* 381*    Recent Results (from the past 240 hour(s))  MRSA PCR SCREENING     Status: Normal   Collection Time   01/19/12  3:10 AM      Component Value Range Status Comment   MRSA by PCR NEGATIVE  NEGATIVE Final      Studies: No results found.  Scheduled Meds:   . citalopram  10 mg Oral Daily  . enoxaparin (LOVENOX)   40 mg Subcutaneous Q24H  . insulin aspart  0-15 Units Subcutaneous TID WC  . insulin aspart  0-5 Units Subcutaneous QHS  . insulin glargine  10 Units Subcutaneous Once  .  Norgestimate-Ethinyl Estradiol Triphasic  1 tablet Oral Daily  . pantoprazole  20 mg Oral Daily

## 2012-01-19 NOTE — H&P (Signed)
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 She developed sees a pediatric endocrinologist and has had trouble finding a primary physician as she has no health insurance. Her prescription are filled by the health Department for free once she presents with a prescription. However, she has run out of her prescription. Also her insulin pump and a slight malfunction 2 days ago, therefore, she started giving herself the Apidra shots from her insulin pump, she was checking her fingerstick blood sugars every 2-3 hours trying to keep her sugars under control with the shots. This a.m. she woke up and had some nausea and vomiting. She has no abdominal pain, no burning on urination, no fever, no chills, no diarrhea. Her nausea and vomiting persisted all day she came to ER. History provided by the patient.  Review of Systems:  The patient denies anorexia, fever, weight loss,, vision loss, decreased hearing, hoarseness, chest pain, syncope, dyspnea on exertion, peripheral edema, balance deficits, hemoptysis, abdominal pain, melena, hematochezia, severe indigestion/heartburn, hematuria, incontinence, genital sores, muscle weakness, suspicious skin lesions, transient blindness, difficulty walking, depression, unusual weight change, abnormal bleeding, enlarged lymph nodes, angioedema, and breast masses.  Past Medical History: Past Medical History  Diagnosis Date  . Diabetes mellitus     Insulin pump  . Hepatitis C   . Depression   . GERD (gastroesophageal reflux disease)   . OCD (obsessive compulsive disorder)   . Iron deficiency anemia    History reviewed. No pertinent past surgical history.  Medications: Prior to Admission medications   Medication Sig Start Date End Date Taking? Authorizing Provider  citalopram (CELEXA) 10 MG tablet Take 10 mg by mouth daily.   Yes Historical Provider, MD  insulin  glulisine (APIDRA) 100 UNIT/ML injection Inject 32 Units into the skin continuous. Sliding scale.Through pump.   Yes Historical Provider, MD  IRON PO Take 1 tablet by mouth daily.   Yes Historical Provider, MD  lansoprazole (PREVACID) 15 MG capsule Take 15 mg by mouth daily.   Yes Historical Provider, MD  Norgestimate-Ethinyl Estradiol Triphasic (ORTHO TRI-CYCLEN, 28,) 0.18/0.215/0.25 MG-35 MCG tablet Take 1 tablet by mouth daily.   Yes Historical Provider, MD    Allergies:  No Known Allergies  Social History:  reports that she has quit smoking. She does not have any smokeless tobacco history on file. She reports that she does not drink alcohol or use illicit drugs.  Family History: No diabetes, no CAD  Physical Exam: Filed Vitals:   01/18/12 2331 01/18/12 2346 01/19/12 0000 01/19/12 0100  BP: 125/65 111/48 109/49 104/43  Pulse: 133 124 120 117  Temp: 98.3 F (36.8 C)     TempSrc: Oral     Resp: 18 17 18 19   Height: 5\' 7"  (1.702 m)     Weight: 78.019 kg (172 lb)     SpO2: 97% 94% 95% 98%    General:  Alert and oriented times three, well developed and nourished, no acute distress Eyes: PERRLA, pink conjunctiva, no scleral icterus ENT: Moist oral mucosa, neck supple, no thyromegaly Lungs: clear to ascultation, no wheeze, no crackles, no use of accessory muscles Cardiovascular: regular rate and rhythm, no regurgitation, no gallops, no murmurs. No carotid bruits, no JVD Abdomen: soft, positive BS, non-tender, non-distended, no organomegaly, not an acute abdomen GU: not examined Neuro: CN II - XII grossly intact, sensation intact Musculoskeletal: strength 5/5 all  extremities, no clubbing, cyanosis or edema Skin: no rash, no subcutaneous crepitation, no decubitus Psych: appropriate patient   Labs on Admission:   [facility] 01/19/12 0018  NA 129*  K 4.8  CL 92*  CO2 18*  GLUCOSE 621*  BUN 14  CREATININE 0.77  CALCIUM 9.0  MG --  PHOS --    Basename 01/19/12 0018  WBC  11.0*  NEUTROABS --  HGB 11.7*  HCT 35.5*  MCV 89.6  PLT 240    Micro Results:  Results for MALVIKA, TUNG (MRN [id number]) as of 01/19/2012 02:01  Ref. Range 01/19/2012 00:45  Color, Urine Latest Range: YELLOW  YELLOW  APPearance Latest Range: CLEAR  CLEAR  Specific Gravity, Urine Latest Range: 1.005-1.030  1.036 (H)  pH Latest Range: 5.0-8.0  5.5  Glucose Latest Range: NEGATIVE mg/dL >7829 (A)  Bilirubin Urine Latest Range: NEGATIVE  NEGATIVE  Ketones, ur Latest Range: NEGATIVE mg/dL >56 (A)  Protein Latest Range: NEGATIVE mg/dL NEGATIVE  Urobilinogen, UA Latest Range: 0.0-1.0 mg/dL 0.2  Nitrite Latest Range: NEGATIVE  NEGATIVE  Leukocytes, UA Latest Range: NEGATIVE  NEGATIVE  Hgb urine dipstick Latest Range: NEGATIVE  NEGATIVE  Squamous Epithelial / LPF Latest Range: RARE  RARE  Preg Test, Ur Latest Range: NEGATIVE  NEGATIVE    Radiological Exams on Admission: No results found.  Assessment/Plan Present on Admission:  . DKA (diabetic ketoacidoses) Admit to step down DKA protocol Discharge and followup at [facility] clinic . hepatitis C   Full code DVT prophylaxis  Zade Falkner 01/19/2012, 2:01 AM

## 2012-01-20 LAB — GLUCOSE, CAPILLARY: Glucose-Capillary: 265 mg/dL — ABNORMAL HIGH (ref 70–99)

## 2012-01-20 NOTE — Discharge Summary (Addendum)
Physician Discharge Summary  Anna Dennis IHK:[id number] DOB: 12/28/1990 DOA: 01/18/2012  PCP: No primary provider on file.  Admit date: 01/18/2012 Discharge date: 01/20/2012  Discharge Diagnoses:  Principal Problem:  *DKA (diabetic ketoacidoses) Active Problems:  DEPRESSION   Discharge Condition: medically stable for discharge home todfay  Diet recommendation: as tolerated, carb modified diet  History of present illness:  21 year old female with history of diabetes on insulin pump who presented with nausea and vomiting 1 day prior to this admission. In ED, lab work up revealed the patient is in DKA. Patient was admitted to SDU for further monitoring and insulin drip. Patient is now off of insulin drip and was transitioned to sub Q insulin. Please note that the patient prefers to be on insulin pump so this will be her regimen on discharge. Patient was encouraged to find PCP.  Assessment/Plan:   Principal Problem:  *DKA  Patient ran out of insulin for insulin pump and was administering insulin injections based on sliding scale  Now off of insulin drip and transitioned to sub Q insulin  Diet advanced as tolerated  Patient will be discharged home and advised to use her insulin pump; prescription provided for Apidra with 4 refills   Code Status: full code  Family Communication: family not at bedside  Disposition Plan: home today  Manson Passey, MD  [facility]  Pager [phone]   Consultants:  Diabetic coordinator Procedures:  None Antibiotics:  None   Discharge Exam: Filed Vitals:   01/20/12 0512  BP: 123/79  Pulse: 86  Temp: 98.5 F (36.9 C)  Resp: 16   Filed Vitals:   01/19/12 1022 01/19/12 1400 01/19/12 2032 01/20/12 0512  BP: 114/74 115/59 138/90 123/79  Pulse: 83 85 93 86  Temp: 97.5 F (36.4 C) 98.7 F (37.1 C) 98.8 F (37.1 C) 98.5 F (36.9 C)  TempSrc: Oral Oral Oral Oral  Resp: 16 16 16 16   Height: 5\' 7"  (1.702 m)     Weight: 80.6 kg (177 lb 11.1 oz)      SpO2: 100% 99% 100% 100%    General: Pt is alert, follows commands appropriately, not in acute distress Cardiovascular: Regular rate and rhythm, S1/S2 +, no murmurs, no rubs, no gallops Respiratory: Clear to auscultation bilaterally, no wheezing, no crackles, no rhonchi Abdominal: Soft, non tender, non distended, bowel sounds +, no guarding Extremities: no edema, no cyanosis, pulses palpable bilaterally DP and PT Neuro: Grossly nonfocal  Discharge Instructions     Medication List     As of 01/20/2012  7:31 AM    TAKE these medications         citalopram 10 MG tablet   Commonly known as: CELEXA   Take 10 mg by mouth daily.      HYDROcodone-acetaminophen 5-325 MG per tablet   Commonly known as: NORCO/VICODIN   Take 1-2 tablets by mouth every 4 (four) hours as needed.      insulin glulisine 100 UNIT/ML injection   Commonly known as: APIDRA   Inject 32 Units into the skin continuous. Sliding scale.Through pump.      IRON PO   Take 1 tablet by mouth daily.      lansoprazole 15 MG capsule   Commonly known as: PREVACID   Take 15 mg by mouth daily.      ORTHO TRI-CYCLEN (28) 0.18/0.215/0.25 MG-35 MCG tablet   Generic drug: Norgestimate-Ethinyl Estradiol Triphasic   Take 1 tablet by mouth daily.  The results of significant diagnostics from this hospitalization (including imaging, microbiology, ancillary and laboratory) are listed below for reference.    Significant Diagnostic Studies: No results found.  Microbiology: Recent Results (from the past 240 hour(s))  MRSA PCR SCREENING     Status: Normal   Collection Time   01/19/12  3:10 AM      Component Value Range Status Comment   MRSA by PCR NEGATIVE  NEGATIVE Final      Labs: Basic Metabolic Panel:  Lab 01/19/12 0454 01/19/12 0716 01/19/12 0507 01/19/12 0345 01/19/12 0018  NA 131* 132* 133* 132* 129*  K 4.7 4.4 4.4 3.9 4.8  CL 99 103 102 99 92*  CO2 23 22 20  15* 18*  GLUCOSE 254* 146* 244* 361*  621*  BUN 9 10 11 12 14   CREATININE 0.65 0.67 0.73 0.75 0.77  CALCIUM 8.6 8.5 8.7 8.5 9.0  MG -- -- -- -- --  PHOS -- -- -- -- --   Liver Function Tests: No results found for this basename: AST:5,ALT:5,ALKPHOS:5,BILITOT:5,PROT:5,ALBUMIN:5 in the last 168 hours No results found for this basename: LIPASE:5,AMYLASE:5 in the last 168 hours No results found for this basename: AMMONIA:5 in the last 168 hours CBC:  Lab 01/19/12 0507 01/19/12 0018  WBC 15.9* 11.0*  NEUTROABS -- --  HGB 11.8* 11.7*  HCT 36.0 35.5*  MCV 88.9 89.6  PLT 258 240   Cardiac Enzymes: No results found for this basename: CKTOTAL:5,CKMB:5,CKMBINDEX:5,TROPONINI:5 in the last 168 hours BNP: BNP (last 3 results) No results found for this basename: PROBNP:3 in the last 8760 hours CBG:  Lab 01/19/12 2127 01/19/12 1650 01/19/12 1323 01/19/12 1228 01/19/12 1158  GLUCAP 124* 137* 378* 451* 430*    Time coordinating discharge: Over 30 minutes  Signed:  Manson Passey, MD  [facility] 01/20/2012, 7:31 AM  Pager #: [phone]

## 2012-01-20 NOTE — Progress Notes (Signed)
A call was made to confirm that pt can get Insulin at [facility], [phone]. However, the pharmacist stated, that they had been asking the pt since July 2013 to find a PCP.  Pt was given [place] [phone] number to call for PCP, she is waiting for a call back.  Instructions all given to pt to use [facility] on [address]/between finding a PCP.

## 2012-01-20 NOTE — Progress Notes (Signed)
Inpatient Diabetes Program Recommendations  AACE/ADA: New Consensus Statement on Inpatient Glycemic Control (2013)  Target Ranges:  Prepandial:   less than 140 mg/dL      Peak postprandial:   less than 180 mg/dL (1-2 hours)      Critically ill patients:  140 - 180 mg/dL   Reason for Visit: Consult for Diabetes  21 year old female with history of diabetes on insulin pump who presented with nausea and vomiting 1 day prior to this admission. In ED, lab work up revealed the patient is in DKA. Patient was admitted to SDU for further monitoring and insulin drip. Patient is now off of insulin drip and was transitioned to sub Q insulin. Please note that the patient prefers to be on insulin pump so this will be her regimen on discharge.  Patient was encouraged to find PCP. Pt states she ran out of insulin and has not seen MD in 2 years since her Medicaid ran out when she turned 19.  "I don't want to have to pay for a doctor and my prescription ran out."  Came into ED in DKA.  Anion gap closed and pt transitioned to SQ.  To restart pump at discharge.  [facility]  Pt has had DM since she was 21 years old.  Understands she must have insulin to live and she verbalized understanding.  Instructed to obtain PCP immediately.  Plans to return to school in January.    Results for Anna Dennis, Anna Dennis (MRN [id number]) as of 01/20/2012 13:03  Ref. Range 01/19/2012 12:28 01/19/2012 13:23 01/19/2012 16:50 01/19/2012 21:27 01/20/2012 07:23  Glucose-Capillary Latest Range: 70-99 mg/dL 469 (H) 629 (H) 528 (H) 124 (H) 265 (H)   Results for Anna Dennis, Anna Dennis (MRN [id number]) as of 01/20/2012 13:03  Ref. Range 01/19/2012 00:18 01/19/2012 03:45 01/19/2012 05:07 01/19/2012 07:16 01/19/2012 09:13  Sodium Latest Range: 135-145 mEq/L 129 (L) 132 (L) 133 (L) 132 (L) 131 (L)  Potassium Latest Range: 3.5-5.1 mEq/L 4.8 3.9 4.4 4.4 4.7  Chloride Latest Range: 96-112 mEq/L 92 (L) 99 102  103 99  CO2 Latest Range: 19-32 mEq/L 18 (L) 15 (L) 20 22 23   BUN Latest Range: 6-23 mg/dL 14 12 11 10 9   Creatinine Latest Range: 0.50-1.10 mg/dL 2.72 5.36 6.44 0.34 7.42  Calcium Latest Range: 8.4-10.5 mg/dL 9.0 8.5 8.7 8.5 8.6  GFR calc non Af Amer Latest Range: >90 mL/min >90 >90 >90 >90 >90  GFR calc Af Amer Latest Range: >90 mL/min >90 >90 >90 >90 >90  Glucose Latest Range: 70-99 mg/dL 595 (HH) 638 (H) 756 (H) 146 (H) 254 (H)       Note: Discussed above with RN and Case Manager.  Will follow.  Thank you. Ailene Ards, RD, LDN, CDE Inpatient Diabetes Coordinator [phone]

## 2012-01-23 LAB — GLUCOSE, CAPILLARY: Glucose-Capillary: 67 mg/dL — ABNORMAL LOW (ref 70–99)

## 2012-02-19 ENCOUNTER — Encounter (HOSPITAL_COMMUNITY): Payer: Self-pay | Admitting: *Deleted

## 2012-02-19 ENCOUNTER — Emergency Department (INDEPENDENT_AMBULATORY_CARE_PROVIDER_SITE_OTHER)
Admission: EM | Admit: 2012-02-19 | Discharge: 2012-02-19 | Disposition: A | Discharge status: Nursing Home (Includes ICF) | Payer: Self-pay | Source: Home / Self Care | Attending: Emergency Medicine | Admitting: Emergency Medicine

## 2012-02-19 ENCOUNTER — Emergency Department (HOSPITAL_COMMUNITY): Payer: Self-pay

## 2012-02-19 ENCOUNTER — Inpatient Hospital Stay (HOSPITAL_COMMUNITY)
Admission: EM | Admit: 2012-02-19 | Discharge: 2012-02-22 | DRG: 639 | Disposition: A | Discharge status: Home / Self Care | Payer: Self-pay | Attending: Internal Medicine | Admitting: Internal Medicine

## 2012-02-19 DIAGNOSIS — E1110 Type 2 diabetes mellitus with ketoacidosis without coma: Secondary | ICD-10-CM | POA: Diagnosis present

## 2012-02-19 DIAGNOSIS — K219 Gastro-esophageal reflux disease without esophagitis: Secondary | ICD-10-CM | POA: Diagnosis present

## 2012-02-19 DIAGNOSIS — F329 Major depressive disorder, single episode, unspecified: Secondary | ICD-10-CM

## 2012-02-19 DIAGNOSIS — I498 Other specified cardiac arrhythmias: Secondary | ICD-10-CM | POA: Diagnosis present

## 2012-02-19 DIAGNOSIS — D509 Iron deficiency anemia, unspecified: Secondary | ICD-10-CM | POA: Diagnosis present

## 2012-02-19 DIAGNOSIS — Z79899 Other long term (current) drug therapy: Secondary | ICD-10-CM

## 2012-02-19 DIAGNOSIS — R000 Tachycardia, unspecified: Secondary | ICD-10-CM | POA: Diagnosis present

## 2012-02-19 DIAGNOSIS — Z9641 Presence of insulin pump (external) (internal): Secondary | ICD-10-CM

## 2012-02-19 DIAGNOSIS — F32A Depression, unspecified: Secondary | ICD-10-CM | POA: Diagnosis present

## 2012-02-19 DIAGNOSIS — F429 Obsessive-compulsive disorder, unspecified: Secondary | ICD-10-CM | POA: Diagnosis present

## 2012-02-19 DIAGNOSIS — E1065 Type 1 diabetes mellitus with hyperglycemia: Secondary | ICD-10-CM | POA: Diagnosis present

## 2012-02-19 DIAGNOSIS — B1920 Unspecified viral hepatitis C without hepatic coma: Secondary | ICD-10-CM | POA: Diagnosis present

## 2012-02-19 DIAGNOSIS — E109 Type 1 diabetes mellitus without complications: Secondary | ICD-10-CM

## 2012-02-19 DIAGNOSIS — D72829 Elevated white blood cell count, unspecified: Secondary | ICD-10-CM | POA: Diagnosis present

## 2012-02-19 DIAGNOSIS — F3289 Other specified depressive episodes: Secondary | ICD-10-CM | POA: Diagnosis present

## 2012-02-19 DIAGNOSIS — E860 Dehydration: Secondary | ICD-10-CM | POA: Diagnosis present

## 2012-02-19 DIAGNOSIS — K3184 Gastroparesis: Secondary | ICD-10-CM | POA: Diagnosis present

## 2012-02-19 DIAGNOSIS — E1010 Type 1 diabetes mellitus with ketoacidosis without coma: Principal | ICD-10-CM | POA: Diagnosis present

## 2012-02-19 LAB — POCT I-STAT 3, VENOUS BLOOD GAS (G3P V)
Acid-base deficit: 20 mmol/L — ABNORMAL HIGH (ref 0.0–2.0)
Bicarbonate: 8.7 mEq/L — ABNORMAL LOW (ref 20.0–24.0)
O2 Saturation: 90 %
TCO2: 9 mmol/L (ref 0–100)
pO2, Ven: 79 mmHg — ABNORMAL HIGH (ref 30.0–45.0)

## 2012-02-19 LAB — CBC WITH DIFFERENTIAL/PLATELET
Basophils Relative: 0 % (ref 0–1)
Eosinophils Absolute: 0 10*3/uL (ref 0.0–0.7)
Eosinophils Relative: 0 % (ref 0–5)
Hemoglobin: 12.7 g/dL (ref 12.0–15.0)
Lymphs Abs: 2.4 10*3/uL (ref 0.7–4.0)
MCH: 30.5 pg (ref 26.0–34.0)
MCHC: 32 g/dL (ref 30.0–36.0)
MCV: 95.2 fL (ref 78.0–100.0)
Monocytes Absolute: 2.4 10*3/uL — ABNORMAL HIGH (ref 0.1–1.0)
RBC: 4.17 MIL/uL (ref 3.87–5.11)

## 2012-02-19 LAB — COMPREHENSIVE METABOLIC PANEL
ALT: 89 U/L — ABNORMAL HIGH (ref 0–35)
Alkaline Phosphatase: 103 U/L (ref 39–117)
BUN: 27 mg/dL — ABNORMAL HIGH (ref 6–23)
Chloride: 91 mEq/L — ABNORMAL LOW (ref 96–112)
GFR calc Af Amer: 71 mL/min — ABNORMAL LOW (ref >90)
Glucose, Bld: 797 mg/dL (ref 70–99)
Potassium: 4.8 mEq/L (ref 3.5–5.1)
Sodium: 136 mEq/L (ref 135–145)
Total Bilirubin: 0.2 mg/dL — ABNORMAL LOW (ref 0.3–1.2)
Total Protein: 8.1 g/dL (ref 6.0–8.3)

## 2012-02-19 LAB — BASIC METABOLIC PANEL
CO2: 15 mEq/L — ABNORMAL LOW (ref 19–32)
CO2: 19 mEq/L (ref 19–32)
Calcium: 8.4 mg/dL (ref 8.4–10.5)
Chloride: 100 mEq/L (ref 96–112)
Creatinine, Ser: 0.89 mg/dL (ref 0.50–1.10)
Glucose, Bld: 332 mg/dL — ABNORMAL HIGH (ref 70–99)
Potassium: 4.4 mEq/L (ref 3.5–5.1)
Sodium: 134 mEq/L — ABNORMAL LOW (ref 135–145)

## 2012-02-19 LAB — GLUCOSE, CAPILLARY

## 2012-02-19 LAB — URINALYSIS, ROUTINE W REFLEX MICROSCOPIC
Bilirubin Urine: NEGATIVE
Glucose, UA: >1000 mg/dL — AB
Ketones, ur: 40 mg/dL — AB
Nitrite: NEGATIVE
Protein, ur: NEGATIVE mg/dL
pH: 5 (ref 5.0–8.0)

## 2012-02-19 LAB — URINE MICROSCOPIC-ADD ON

## 2012-02-19 MED ORDER — CITALOPRAM HYDROBROMIDE 10 MG PO TABS
10.0000 mg | ORAL_TABLET | Freq: Every day | ORAL | Status: DC
  Administered 2012-02-20 – 2012-02-22 (×3): 10 mg via ORAL
  Filled 2012-02-19 (×3): qty 1

## 2012-02-19 MED ORDER — INSULIN ASPART 100 UNIT/ML ~~LOC~~ SOLN
10.0000 [IU] | Freq: Once | SUBCUTANEOUS | Status: AC
  Administered 2012-02-19: 10 [IU] via SUBCUTANEOUS

## 2012-02-19 MED ORDER — SODIUM CHLORIDE 0.9 % IV BOLUS (SEPSIS)
1000.0000 mL | Freq: Once | INTRAVENOUS | Status: AC
  Administered 2012-02-19: 1000 mL via INTRAVENOUS

## 2012-02-19 MED ORDER — PROMETHAZINE HCL 25 MG RE SUPP
12.5000 mg | Freq: Four times a day (QID) | RECTAL | Status: DC | PRN
  Filled 2012-02-19: qty 1

## 2012-02-19 MED ORDER — SODIUM CHLORIDE 0.9 % IV SOLN
INTRAVENOUS | Status: DC
  Administered 2012-02-19: 5.4 [IU]/h via INTRAVENOUS
  Filled 2012-02-19: qty 1

## 2012-02-19 MED ORDER — SODIUM CHLORIDE 0.9 % IV SOLN
INTRAVENOUS | Status: AC
  Administered 2012-02-19: 21:00:00 via INTRAVENOUS

## 2012-02-19 MED ORDER — ONDANSETRON HCL 4 MG/2ML IJ SOLN
4.0000 mg | Freq: Once | INTRAMUSCULAR | Status: DC
  Filled 2012-02-19: qty 2

## 2012-02-19 MED ORDER — DEXTROSE-NACL 5-0.45 % IV SOLN
INTRAVENOUS | Status: DC
  Administered 2012-02-20: via INTRAVENOUS

## 2012-02-19 MED ORDER — PROMETHAZINE HCL 25 MG PO TABS
12.5000 mg | ORAL_TABLET | Freq: Four times a day (QID) | ORAL | Status: DC | PRN
  Administered 2012-02-20: 12.5 mg via ORAL
  Filled 2012-02-19 (×2): qty 1

## 2012-02-19 MED ORDER — DEXTROSE 50 % IV SOLN: 25.0000 mL | INTRAVENOUS | Status: DC | PRN

## 2012-02-19 MED ORDER — PANTOPRAZOLE SODIUM 20 MG PO TBEC
20.0000 mg | DELAYED_RELEASE_TABLET | Freq: Every day | ORAL | Status: DC
  Administered 2012-02-20 – 2012-02-22 (×3): 20 mg via ORAL
  Filled 2012-02-19 (×3): qty 1

## 2012-02-19 MED ORDER — MORPHINE SULFATE 4 MG/ML IJ SOLN
4.0000 mg | Freq: Once | INTRAMUSCULAR | Status: AC
  Administered 2012-02-19: 4 mg via INTRAVENOUS
  Filled 2012-02-19: qty 1

## 2012-02-19 MED ORDER — MORPHINE SULFATE 2 MG/ML IJ SOLN
1.0000 mg | INTRAMUSCULAR | Status: DC | PRN
  Administered 2012-02-20 – 2012-02-21 (×5): 1 mg via INTRAVENOUS
  Filled 2012-02-19 (×6): qty 1

## 2012-02-19 MED ORDER — ACETAMINOPHEN 325 MG PO TABS
650.0000 mg | ORAL_TABLET | Freq: Four times a day (QID) | ORAL | Status: DC | PRN
  Administered 2012-02-19: 650 mg via ORAL
  Filled 2012-02-19: qty 2

## 2012-02-19 MED ORDER — SODIUM CHLORIDE 0.9 % IV SOLN
INTRAVENOUS | Status: DC
  Filled 2012-02-19 (×2): qty 1

## 2012-02-19 MED ORDER — ENOXAPARIN SODIUM 40 MG/0.4ML ~~LOC~~ SOLN
40.0000 mg | SUBCUTANEOUS | Status: DC
  Administered 2012-02-19 – 2012-02-21 (×3): 40 mg via SUBCUTANEOUS
  Filled 2012-02-19 (×4): qty 0.4

## 2012-02-19 MED ORDER — INSULIN ASPART 100 UNIT/ML ~~LOC~~ SOLN
SUBCUTANEOUS | Status: AC
  Filled 2012-02-19: qty 10

## 2012-02-19 MED ORDER — PROMETHAZINE HCL 25 MG/ML IJ SOLN
12.5000 mg | Freq: Four times a day (QID) | INTRAMUSCULAR | Status: DC | PRN
  Filled 2012-02-19: qty 1

## 2012-02-19 MED ORDER — ONDANSETRON HCL 4 MG/2ML IJ SOLN
4.0000 mg | Freq: Four times a day (QID) | INTRAMUSCULAR | Status: DC | PRN
  Administered 2012-02-19 – 2012-02-20 (×2): 4 mg via INTRAVENOUS
  Filled 2012-02-19: qty 2

## 2012-02-19 MED ORDER — POTASSIUM CHLORIDE 10 MEQ/100ML IV SOLN
10.0000 meq | INTRAVENOUS | Status: AC
  Administered 2012-02-19 – 2012-02-20 (×4): 10 meq via INTRAVENOUS
  Filled 2012-02-19 (×3): qty 100

## 2012-02-19 MED ORDER — SODIUM CHLORIDE 0.9 % IV SOLN
INTRAVENOUS | Status: DC
  Administered 2012-02-19: 20:00:00 via INTRAVENOUS

## 2012-02-19 MED ORDER — SODIUM CHLORIDE 0.9 % IV SOLN
INTRAVENOUS | Status: DC
  Administered 2012-02-19: 23:00:00 via INTRAVENOUS

## 2012-02-19 MED ORDER — FERROUS SULFATE 325 (65 FE) MG PO TABS
325.0000 mg | ORAL_TABLET | Freq: Two times a day (BID) | ORAL | Status: DC
Start: 2012-02-20 — End: 2012-02-22
  Administered 2012-02-20 – 2012-02-22 (×4): 325 mg via ORAL
  Filled 2012-02-19 (×7): qty 1

## 2012-02-19 NOTE — ED Provider Notes (Signed)
22 year old female diabetic on an insulin pump states that the pump stopped working and she has not had any insulin since yesterday. There's been some nausea and vomiting and she also describes some myalgias. Denies fever or chills. On exam, she does appear dehydrated. Heart rate is over 130. Lungs are clear and heart is tachycardic without murmur. Abdomen is soft and nontender with bowel sounds decreased. Capillary blood glucose was over 600. Venous blood gas shows pH of 7.1. She clearly is in ketoacidosis and will be treated with IV fluids and IV insulin and will need hospital admission. Old records are reviewed and she has several hospitalizations for ketoacidosis.  Blood sugars come back nearly 800, the CBC is almost 40,000, there is been worsening of her baseline renal function with creatinine going from 0.65-1.25. She has been started on an insulin drip following IV fluid bolus. Case is discussed with Dr. Betti Cruz of [facility] who agrees to admit the patient. She will be admitted to the step down unit.  CRITICAL CARE Performed by: Dione Booze   Total critical care time: 45 minutes  Critical care time was exclusive of separately billable procedures and treating other patients.  Critical care was necessary to treat or prevent imminent or life-threatening deterioration.  Critical care was time spent personally by me on the following activities: development of treatment plan with patient and/or surrogate as well as nursing, discussions with consultants, evaluation of patient's response to treatment, examination of patient, obtaining history from patient or surrogate, ordering and performing treatments and interventions, ordering and review of laboratory studies, ordering and review of radiographic studies, pulse oximetry and re-evaluation of patient's condition.   I saw and evaluated the patient, reviewed the resident's note and I agree with the findings and plan.   Dione Booze,  MD 02/19/12 [phone]

## 2012-02-19 NOTE — ED Notes (Signed)
[facility] here for transport. 

## 2012-02-19 NOTE — ED Notes (Signed)
Report given to [facility] charge nurse

## 2012-02-19 NOTE — ED Notes (Signed)
Pt. Vomiting yellow-clear liquids appx 200ccs MD made aware

## 2012-02-19 NOTE — ED Provider Notes (Signed)
History     CSN: [id number]  Arrival date & time 02/19/12  1511   First MD Initiated Contact with Patient 02/19/12 1528      Chief Complaint  Patient presents with  . Diabetes    (Consider location/radiation/quality/duration/timing/severity/associated sxs/prior treatment) HPI Comments: Patient presents urgent care nauseous, feeling dizzy tired fatigued for several days. Have ran out of her insulin- currently with no primary care Dr. Using ulcer provides A. Insulin pump with a Apidra, she has ran out of her insulin. Patient went to the [facility] Department where she was decided to come here for medicine refills. Patient feels drowsy, nauseous unable to walk as she's feeling extreme tiredness and fatigue.  The history is provided by the patient.    Past Medical History  Diagnosis Date  . Diabetes mellitus     Insulin pump  . Hepatitis C   . Depression   . GERD (gastroesophageal reflux disease)   . OCD (obsessive compulsive disorder)   . Iron deficiency anemia     No past surgical history on file.  No family history on file.  History  Substance Use Topics  . Smoking status: Former Games developer  . Smokeless tobacco: Not on file  . Alcohol Use: No    OB History    Grav Para Term Preterm Abortions TAB SAB Ect Mult Living                  Review of Systems  Constitutional: Positive for diaphoresis, activity change, appetite change and fatigue. Negative for fever and unexpected weight change.  Skin: Negative for color change.  Neurological: Positive for dizziness. Negative for light-headedness, numbness and headaches.    Allergies  Review of patient's allergies indicates no known allergies.  Home Medications   Current Outpatient Rx  Name  Route  Sig  Dispense  Refill  . CITALOPRAM HYDROBROMIDE 10 MG PO TABS   Oral   Take 10 mg by mouth daily.         Marland Kitchen HYDROCODONE-ACETAMINOPHEN 5-325 MG PO TABS   Oral   Take 1-2 tablets by mouth every 4 (four) hours as  needed.   30 tablet   0   . INSULIN GLULISINE 100 UNIT/ML IJ SOLN   Subcutaneous   Inject 32 Units into the skin continuous. Sliding scale.Through pump.   10 mL   4   . IRON PO   Oral   Take 1 tablet by mouth daily.         Marland Kitchen LANSOPRAZOLE 15 MG PO CPDR   Oral   Take 15 mg by mouth daily.         Marland Kitchen NORGESTIM-ETH ESTRAD TRIPHASIC 0.18/0.215/0.25 MG-35 MCG PO TABS   Oral   Take 1 tablet by mouth daily.           BP 116/59  Pulse 129  Temp 97.8 F (36.6 C) (Oral)  Resp 18  Ht 5\' 11"  (1.803 m)  Wt 165 lb (74.844 kg)  BMI 23.01 kg/m2  SpO2 97%  Physical Exam  Nursing note and vitals reviewed. Constitutional: Vital signs are normal. She appears well-developed and well-nourished.  Non-toxic appearance. She does not have a sickly appearance.  HENT:  Head: Normocephalic.  Eyes: Conjunctivae normal are normal. No foreign bodies found. Right eye exhibits no chemosis. No scleral icterus.  Neck: Neck supple.  Pulmonary/Chest: Effort normal and breath sounds normal. She has no decreased breath sounds.  Neurological: She is alert.  Skin: No rash noted. She  is diaphoretic. No erythema.    ED Course  Procedures (including critical care time)  Labs Reviewed  GLUCOSE, CAPILLARY - Abnormal; Notable for the following:    Glucose-Capillary >600 (*)     All other components within normal limits   No results found.   1. DKA (diabetic ketoacidoses)       MDM  Insulin-dependent diabetic, presents multi-symptomatic,  moderate dehydration- and drowsy. Clinical suspicion of ketoacidosis. At this time trying to obtain IV access.       Jimmie Molly, MD 02/19/12 [phone]

## 2012-02-19 NOTE — ED Provider Notes (Signed)
History     CSN: [id number]  Arrival date & time 02/19/12  1630   First MD Initiated Contact with Patient 02/19/12 1636      Chief Complaint  Patient presents with  . Hyperglycemia    (Consider location/radiation/quality/duration/timing/severity/associated sxs/prior treatment) Patient is a 22 y.o. female presenting with general illness. The history is provided by the patient.  Illness  The current episode started 2 days ago. The problem occurs frequently. The problem has been unchanged. The problem is moderate. Nothing relieves the symptoms. Nothing aggravates the symptoms. Pertinent negatives include no fever, no photophobia, no abdominal pain, no diarrhea, no nausea, no vomiting, no congestion, no ear pain, no headaches, no rhinorrhea, no sore throat, no stridor, no muscle aches, no cough, no URI, no wheezing and no rash. She has been less active. She has been drinking less than usual and eating less than usual. Urine output has decreased. Recently, medical care has been given at another facility.    Past Medical History  Diagnosis Date  . Diabetes mellitus     Insulin pump  . Hepatitis C   . Depression   . GERD (gastroesophageal reflux disease)   . OCD (obsessive compulsive disorder)   . Iron deficiency anemia     History reviewed. No pertinent past surgical history.  Family History  Problem Relation Age of Onset  . Bipolar disorder Mother   . Schizophrenia Sister     History  Substance Use Topics  . Smoking status: Former Games developer  . Smokeless tobacco: Not on file  . Alcohol Use: No    OB History    Grav Para Term Preterm Abortions TAB SAB Ect Mult Living                  Review of Systems  Constitutional: Negative for fever and fatigue.  HENT: Negative for ear pain, congestion, sore throat, rhinorrhea and postnasal drip.   Eyes: Negative for photophobia and visual disturbance.  Respiratory: Negative for cough, chest tightness, shortness of breath, wheezing  and stridor.   Cardiovascular: Negative for chest pain, palpitations and leg swelling.  Gastrointestinal: Negative for nausea, vomiting, abdominal pain and diarrhea.  Genitourinary: Negative for urgency, frequency and difficulty urinating.  Musculoskeletal: Negative for back pain and arthralgias.  Skin: Negative for rash and wound.  Neurological: Negative for weakness and headaches.  Psychiatric/Behavioral: Positive for confusion. Negative for agitation.    Allergies  Review of patient's allergies indicates no known allergies.  Home Medications   No current outpatient prescriptions on file.  BP 101/50  Pulse 116  Temp 99 F (37.2 C) (Oral)  Resp 19  Ht 5\' 7"  (1.702 m)  Wt 165 lb 5.5 oz (75 kg)  BMI 25.90 kg/m2  SpO2 99%  LMP 02/04/2012  Physical Exam  Nursing note and vitals reviewed. Constitutional: She is oriented to person, place, and time. She appears well-developed and well-nourished. No distress.  HENT:  Head: Normocephalic and atraumatic.  Mouth/Throat: Oropharynx is clear and moist.  Eyes: EOM are normal. Pupils are equal, round, and reactive to light.  Neck: Normal range of motion. Neck supple.  Cardiovascular: Regular rhythm, normal heart sounds and intact distal pulses.  Tachycardia present.   Pulmonary/Chest: Effort normal and breath sounds normal. She has no wheezes. She has no rales.  Abdominal: Soft. Bowel sounds are normal. She exhibits no distension. There is no tenderness. There is no rebound and no guarding.  Musculoskeletal: Normal range of motion. She exhibits no edema and no  tenderness.  Lymphadenopathy:    She has no cervical adenopathy.  Neurological: She is alert and oriented to person, place, and time. She displays normal reflexes. No cranial nerve deficit. She exhibits normal muscle tone. Coordination normal.  Skin: Skin is warm and dry. No rash noted.  Psychiatric: She has a normal mood and affect. Her behavior is normal.    ED Course    Procedures (including critical care time)  Labs Reviewed  COMPREHENSIVE METABOLIC PANEL - Abnormal; Notable for the following:    Chloride 91 (*)     CO2 7 (*)     Glucose, Bld 797 (*)     BUN 27 (*)     Creatinine, Ser 1.23 (*)     ALT 89 (*)     Total Bilirubin 0.2 (*)     GFR calc non Af Amer 61 (*)     GFR calc Af Amer 71 (*)     All other components within normal limits  URINALYSIS, ROUTINE W REFLEX MICROSCOPIC - Abnormal; Notable for the following:    Glucose, UA >1000 (*)     Hgb urine dipstick SMALL (*)     Ketones, ur 40 (*)     All other components within normal limits  GLUCOSE, CAPILLARY - Abnormal; Notable for the following:    Glucose-Capillary >600 (*)     All other components within normal limits  CBC WITH DIFFERENTIAL - Abnormal; Notable for the following:    WBC 39.4 (*)     Platelets 406 (*)     Neutrophils Relative 88 (*)     Lymphocytes Relative 6 (*)     Neutro Abs 34.6 (*)     Monocytes Absolute 2.4 (*)     All other components within normal limits  POCT I-STAT 3, BLOOD GAS (G3P V) - Abnormal; Notable for the following:    pH, Ven 7.101 (*)     pCO2, Ven 27.8 (*)     pO2, Ven 79.0 (*)     Bicarbonate 8.7 (*)     Acid-base deficit 20.0 (*)     All other components within normal limits  URINE MICROSCOPIC-ADD ON - Abnormal; Notable for the following:    Squamous Epithelial / LPF MANY (*)     All other components within normal limits  GLUCOSE, CAPILLARY - Abnormal; Notable for the following:    Glucose-Capillary 520 (*)     All other components within normal limits  BASIC METABOLIC PANEL - Abnormal; Notable for the following:    Sodium 134 (*)     CO2 15 (*)     Glucose, Bld 452 (*)     All other components within normal limits  BASIC METABOLIC PANEL - Abnormal; Notable for the following:    Sodium 134 (*)     Glucose, Bld 332 (*)     All other components within normal limits  TROPONIN I  MRSA PCR SCREENING  BASIC METABOLIC PANEL  BASIC  METABOLIC PANEL  BASIC METABOLIC PANEL  BASIC METABOLIC PANEL  CBC  HEMOGLOBIN A1C  TROPONIN I   Dg Chest 2 View  02/19/2012  *RADIOLOGY REPORT*  Clinical Data: Chest pain and nausea.  CHEST - 2 VIEW  Comparison: PA and lateral chest 05/05/2010.  Findings: Lungs clear.  Heart size normal.  No pneumothorax or pleural fluid.  IMPRESSION: Negative chest.   Original Report Authenticated By: Holley Dexter, M.D.      1. DKA (diabetic ketoacidoses)   2. Leukocytosis  3. Depressive disorder, not elsewhere classified   4. GERD (gastroesophageal reflux disease)       MDM  14F with type I diabetes who ran out of her insulin 2 days ago here with what appears to be DKA. Blood glucose >600. Tachycardic 120's. Normotensive. Slightly confused. Rest of exam benign. No fevers. Lungs clear. Labs reveal Ph 7.1. Bicarb 7. Glucose 797. Acute renal failure 1.23. Leukocytosis of 39,000. Unsure etiology of leukocytosis. Will obtain screening CXR. Urine not c/w infection. Given 2L IVF. Started on insulin gtt and will call for admission.        Johnnette Gourd, MD 02/20/12 [phone]

## 2012-02-19 NOTE — ED Notes (Signed)
Report given to [facility] by phone. 

## 2012-02-19 NOTE — ED Notes (Signed)
Pt ambulated to the bathroom by herself.

## 2012-02-19 NOTE — ED Notes (Signed)
Pt has an insulin pump   She started feeling tired yesterday.  Today she c/o fatigue and weakness   Her last insulin was last night    Pt is mumbling sentences  Skin warm and dry, color good

## 2012-02-19 NOTE — H&P (Signed)
Patient's PCP: No PCP, currently goes to the health Department.  Chief Complaint: Hyperglycemia  History of Present Illness: Anna Dennis is a 22 y.o. female with history of diabetes on insulin pump, hepatitis C, depression, GERD, iron deficiency anemia, and history of DKA who presents with the above complaints.  Patient was recently hospitalized in 01/18/2012 to 01/20/2012 with DKA.  Patient reports that she is on an insulin pop and has been getting samples at the health department.  She noted that on 02/17/2012 she was running out of insulin and went to the health department which unfortunately was closed due to [employer] holiday.  She went back to the health department on 02/18/2012, yesterday, health department did not have Apidra insulin vials, however did have Apidra insulin pens but a pharmacist was not available to dispense the pens.  She ran out of insulin yesterday evening.  She has been feeling nauseated and has vomited multiple times at home.  She has had some muscle weakness and muscle aches.  Also has chest pain.  This morning she went to the health department again but was referred to the urgent care however given her hyperglycemia she was instructed to come to the emergency department for further evaluation.  Patient was found to be in DKA and hospitalist service was asked to admit the patient for further care and management.  Patient denies any recent fevers or chills.  Started having nausea, vomiting, chest pain, and muscle aches since yesterday with hypoglycemia while off of insulin.  Denies any abdominal pain or diarrhea.  Complaining of the headache.  Denies any vision changes.  Review of Systems: All systems reviewed with the patient and positive as per history of present illness, otherwise all other systems are negative.  Past Medical History  Diagnosis Date  . Diabetes mellitus     Insulin pump  . Hepatitis C   . Depression   . GERD (gastroesophageal reflux  disease)   . OCD (obsessive compulsive disorder)   . Iron deficiency anemia    History reviewed. No pertinent past surgical history. Family History  Problem Relation Age of Onset  . Bipolar disorder Mother   . Schizophrenia Sister    History   Social History  . Marital Status: Single    Spouse Name: N/A    Number of Children: N/A  . Years of Education: N/A   Occupational History  . Not on file.   Social History Main Topics  . Smoking status: Former Games developer  . Smokeless tobacco: Not on file  . Alcohol Use: No  . Drug Use: No  . Sexually Active:    Other Topics Concern  . Not on file   Social History Narrative  . No narrative on file   Allergies: Review of patient's allergies indicates no known allergies.  Home Meds: Prior to Admission medications   Medication Sig Start Date End Date Taking? Authorizing Provider  citalopram (CELEXA) 10 MG tablet Take 10 mg by mouth daily.   Yes Historical Provider, MD  insulin glulisine (APIDRA) 100 UNIT/ML injection Inject 32 Units into the skin continuous. Sliding scale.Through pump. 01/19/12  Yes Alison Murray, MD  IRON PO Take 1 tablet by mouth daily.   Yes Historical Provider, MD  lansoprazole (PREVACID) 15 MG capsule Take 15 mg by mouth daily.   Yes Historical Provider, MD  Norgestimate-Ethinyl Estradiol Triphasic (ORTHO TRI-CYCLEN, 28,) 0.18/0.215/0.25 MG-35 MCG tablet Take 1 tablet by mouth daily.   Yes Historical Provider, MD  Physical Exam: Blood pressure 103/50, pulse 126, temperature 98.1 F (36.7 C), temperature source Oral, resp. rate 14, last menstrual period 02/04/2012, SpO2 99.00%. General: Awake, Oriented x3, No acute distress. HEENT: EOMI, dry mucous membranes Neck: Supple CV: S1 and S2 Lungs: Clear to ascultation bilaterally Abdomen: Soft, Nontender, Nondistended, +bowel sounds. Ext: Good pulses. Trace edema. No clubbing or cyanosis noted. Neuro: Cranial Nerves II-XII grossly intact. Has 5/5 motor strength  in upper and lower extremities.  Lab results:  [facility] 02/19/12 1644  NA 136  K 4.8  CL 91*  CO2 7*  GLUCOSE 797*  BUN 27*  CREATININE 1.23*  CALCIUM 9.7  MG --  PHOS --    Basename 02/19/12 1644  AST 34  ALT 89*  ALKPHOS 103  BILITOT 0.2*  PROT 8.1  ALBUMIN 4.5   No results found for this basename: LIPASE:2,AMYLASE:2 in the last 72 hours  Basename 02/19/12 1644  WBC 39.4*  NEUTROABS 34.6*  HGB 12.7  HCT 39.7  MCV 95.2  PLT 406*   No results found for this basename: CKTOTAL:3,CKMB:3,CKMBINDEX:3,TROPONINI:3 in the last 72 hours No components found with this basename: POCBNP:3 No results found for this basename: DDIMER in the last 72 hours No results found for this basename: HGBA1C:2 in the last 72 hours No results found for this basename: CHOL:2,HDL:2,LDLCALC:2,TRIG:2,CHOLHDL:2,LDLDIRECT:2 in the last 72 hours No results found for this basename: TSH,T4TOTAL,FREET3,T3FREE,THYROIDAB in the last 72 hours No results found for this basename: VITAMINB12:2,FOLATE:2,FERRITIN:2,TIBC:2,IRON:2,RETICCTPCT:2 in the last 72 hours Imaging results:  Dg Chest 2 View  02/19/2012  *RADIOLOGY REPORT*  Clinical Data: Chest pain and nausea.  CHEST - 2 VIEW  Comparison: PA and lateral chest 05/05/2010.  Findings: Lungs clear.  Heart size normal.  No pneumothorax or pleural fluid.  IMPRESSION: Negative chest.   Original Report Authenticated By: Holley Dexter, M.D.    Other results: EKG: Sinus tachycardia with heart rate of 112, with PVCs.  Assessment & Plan by Problem: Diabetic ketoacidosis/uncontrolled type 1 diabetes Likely due to patient running out of insulin.  Infectious workup negative with urinalysis and chest x-ray.  Start patient on insulin drip.  Currently anion gap of 38.  Once anion gap closes can be transitioned to subcutaneous insulin.  Will have the patient n.p.o. until diabetic ketoacidosis corrects.  Continue aggressive fluid resuscitation.  Will check a hemoglobin A1c  to assess degree of control at home.  Dehydration/mild renal insufficiency Due to diabetic ketoacidosis, continue aggressive fluid resuscitation.  Leukocytosis Likely due to diabetic ketoacidosis, infectious workup negative.  Metabolic acidosis Due to DKA.  GERD Continue PPI.  Depression Continue home medications.  Iron deficiency anemia Hemoglobin stable, patient likely hemoconcentrated due to dehydration.  Continue supplemental iron.  Tachycardia/Chest pain Likely due to DKA and dehydration. Cycle cardiac enzymes x2. Continue fluids.  Prophylaxis Lovenox.  CODE STATUS Full code.  Disposition Admit the patient as inpatient to step down.  Time spent on admission, talking to the patient, and coordinating care was: 60 mins.  Avneet Ashmore A, MD 02/19/2012, 7:03 PM

## 2012-02-19 NOTE — ED Notes (Addendum)
[facility]- pt with hx of type 1 diabetes, pt with insulin pump, stopped working at some point yesterday. CBG >600. 10units of novolog given at urgent care. 22g(R)wrist.

## 2012-02-20 DIAGNOSIS — E860 Dehydration: Secondary | ICD-10-CM | POA: Diagnosis present

## 2012-02-20 DIAGNOSIS — R000 Tachycardia, unspecified: Secondary | ICD-10-CM | POA: Diagnosis present

## 2012-02-20 DIAGNOSIS — E1110 Type 2 diabetes mellitus with ketoacidosis without coma: Secondary | ICD-10-CM

## 2012-02-20 LAB — GLUCOSE, CAPILLARY
Glucose-Capillary: 312 mg/dL — ABNORMAL HIGH (ref 70–99)
Glucose-Capillary: 248 mg/dL — ABNORMAL HIGH (ref 70–99)
Glucose-Capillary: 196 mg/dL — ABNORMAL HIGH (ref 70–99)
Glucose-Capillary: 140 mg/dL — ABNORMAL HIGH (ref 70–99)
Glucose-Capillary: 125 mg/dL — ABNORMAL HIGH (ref 70–99)
Glucose-Capillary: 118 mg/dL — ABNORMAL HIGH (ref 70–99)
Glucose-Capillary: 111 mg/dL — ABNORMAL HIGH (ref 70–99)
Glucose-Capillary: 263 mg/dL — ABNORMAL HIGH (ref 70–99)
Glucose-Capillary: 135 mg/dL — ABNORMAL HIGH (ref 70–99)
Glucose-Capillary: 316 mg/dL — ABNORMAL HIGH (ref 70–99)
Glucose-Capillary: 292 mg/dL — ABNORMAL HIGH (ref 70–99)

## 2012-02-20 LAB — CBC
MCH: 30.5 pg (ref 26.0–34.0)
MCV: 87.5 fL (ref 78.0–100.0)
Platelets: 301 10*3/uL (ref 150–400)
RDW: 13.1 % (ref 11.5–15.5)

## 2012-02-20 LAB — POCT I-STAT, CHEM 8
Calcium, Ion: 1.15 mmol/L (ref 1.12–1.23)
Chloride: 102 mEq/L (ref 96–112)
Glucose, Bld: >700 mg/dL (ref 70–99)
HCT: 46 % (ref 36.0–46.0)
Hemoglobin: 15.6 g/dL — ABNORMAL HIGH (ref 12.0–15.0)

## 2012-02-20 LAB — TROPONIN I: Troponin I: <0.3 ng/mL (ref <0.30)

## 2012-02-20 LAB — BASIC METABOLIC PANEL
BUN: 16 mg/dL (ref 6–23)
BUN: 14 mg/dL (ref 6–23)
CO2: 21 mEq/L (ref 19–32)
CO2: 21 mEq/L (ref 19–32)
Calcium: 8.9 mg/dL (ref 8.4–10.5)
Chloride: 103 mEq/L (ref 96–112)
Chloride: 105 mEq/L (ref 96–112)
Creatinine, Ser: 0.75 mg/dL (ref 0.50–1.10)
Creatinine, Ser: 0.71 mg/dL (ref 0.50–1.10)
Glucose, Bld: 284 mg/dL — ABNORMAL HIGH (ref 70–99)
Glucose, Bld: 170 mg/dL — ABNORMAL HIGH (ref 70–99)
Glucose, Bld: 143 mg/dL — ABNORMAL HIGH (ref 70–99)
Potassium: 4.2 mEq/L (ref 3.5–5.1)

## 2012-02-20 MED ORDER — INSULIN ASPART 100 UNIT/ML ~~LOC~~ SOLN
0.0000 [IU] | Freq: Three times a day (TID) | SUBCUTANEOUS | Status: DC
  Administered 2012-02-21: 5 [IU] via SUBCUTANEOUS
  Administered 2012-02-21: 6 [IU] via SUBCUTANEOUS
  Administered 2012-02-21: 5 [IU] via SUBCUTANEOUS
  Administered 2012-02-22: 1 [IU] via SUBCUTANEOUS

## 2012-02-20 MED ORDER — MORPHINE SULFATE 2 MG/ML IJ SOLN
1.0000 mg | Freq: Once | INTRAMUSCULAR | Status: AC
  Administered 2012-02-20: 1 mg via INTRAVENOUS

## 2012-02-20 MED ORDER — INSULIN ASPART 100 UNIT/ML ~~LOC~~ SOLN
0.0000 [IU] | Freq: Every day | SUBCUTANEOUS | Status: DC
  Administered 2012-02-20 – 2012-02-21 (×2): 4 [IU] via SUBCUTANEOUS

## 2012-02-20 MED ORDER — SODIUM CHLORIDE 0.9 % IV SOLN: INTRAVENOUS | Status: DC

## 2012-02-20 MED ORDER — DEXTROSE 50 % IV SOLN: 50.0000 mL | Freq: Once | INTRAVENOUS | Status: AC | PRN

## 2012-02-20 MED ORDER — INSULIN GLARGINE 100 UNIT/ML ~~LOC~~ SOLN: 10.0000 [IU] | Freq: Every day | SUBCUTANEOUS | Status: DC

## 2012-02-20 MED ORDER — INSULIN ASPART 100 UNIT/ML ~~LOC~~ SOLN
0.0000 [IU] | Freq: Every day | SUBCUTANEOUS | Status: DC
Start: 2012-02-20 — End: 2012-02-20

## 2012-02-20 MED ORDER — SODIUM CHLORIDE 0.9 % IV BOLUS (SEPSIS)
500.0000 mL | Freq: Once | INTRAVENOUS | Status: AC
  Administered 2012-02-20: 500 mL via INTRAVENOUS

## 2012-02-20 MED ORDER — INSULIN GLARGINE 100 UNIT/ML ~~LOC~~ SOLN
23.0000 [IU] | SUBCUTANEOUS | Status: AC
  Administered 2012-02-20: 23 [IU] via SUBCUTANEOUS

## 2012-02-20 MED ORDER — INSULIN GLARGINE 100 UNIT/ML ~~LOC~~ SOLN
10.0000 [IU] | SUBCUTANEOUS | Status: AC
  Administered 2012-02-20: 10 [IU] via SUBCUTANEOUS

## 2012-02-20 MED ORDER — DEXTROSE 50 % IV SOLN: 25.0000 mL | Freq: Once | INTRAVENOUS | Status: AC | PRN

## 2012-02-20 MED ORDER — SODIUM CHLORIDE 0.9 % IV BOLUS (SEPSIS): 500.0000 mL | Freq: Once | INTRAVENOUS | Status: DC

## 2012-02-20 MED ORDER — INSULIN GLARGINE 100 UNIT/ML ~~LOC~~ SOLN: 33.0000 [IU] | Freq: Every day | SUBCUTANEOUS | Status: DC

## 2012-02-20 MED ORDER — SODIUM CHLORIDE 0.9 % IV SOLN
INTRAVENOUS | Status: DC
  Administered 2012-02-20 – 2012-02-22 (×4): via INTRAVENOUS

## 2012-02-20 MED ORDER — INSULIN ASPART 100 UNIT/ML ~~LOC~~ SOLN: 4.0000 [IU] | Freq: Three times a day (TID) | SUBCUTANEOUS | Status: DC

## 2012-02-20 MED ORDER — INSULIN ASPART 100 UNIT/ML ~~LOC~~ SOLN
0.0000 [IU] | Freq: Three times a day (TID) | SUBCUTANEOUS | Status: DC
  Administered 2012-02-20 (×2): 8 [IU] via SUBCUTANEOUS

## 2012-02-20 MED ORDER — INSULIN ASPART 100 UNIT/ML ~~LOC~~ SOLN
6.0000 [IU] | Freq: Three times a day (TID) | SUBCUTANEOUS | Status: DC
  Administered 2012-02-21 – 2012-02-22 (×3): 6 [IU] via SUBCUTANEOUS

## 2012-02-20 MED ORDER — INSULIN ASPART 100 UNIT/ML ~~LOC~~ SOLN: 0.0000 [IU] | Freq: Three times a day (TID) | SUBCUTANEOUS | Status: DC

## 2012-02-20 MED ORDER — GLUCOSE 40 % PO GEL: 1.0000 | ORAL | Status: DC | PRN

## 2012-02-20 NOTE — Care Management Note (Signed)
    Page 1 of 1   02/20/2012     11:44:11 AM   CARE MANAGEMENT NOTE 02/20/2012  Patient:  Anna Dennis,Anna Dennis   Account Number:  [id number]  Date Initiated:  02/20/2012  Documentation initiated by:  Donn Pierini  Subjective/Objective Assessment:   pt admitted with DKA     Action/Plan:   PTA pt lived at home- independent   Anticipated DC Date:  02/21/2012   Anticipated DC Plan:  HOME/SELF CARE      DC Planning Services  CM consult  Medication Assistance      Choice offered to / List presented to:             Status of service:  In process, will continue to follow Medicare Important Message given?   (If response is "NO", the following Medicare IM given date fields will be blank) Date Medicare IM given:   Date Additional Medicare IM given:    Discharge Disposition:    Per UR Regulation:  Reviewed for med. necessity/level of care/duration of stay  If discussed at Long Length of Stay Meetings, dates discussed:    Comments:  02/19/12- 1115- Donn Pierini RN, BSN [phone] Referral for PCP and medication assistance- spoke with pt at bedside- per conversation pt has been going to the Health Dept. for free insulin she states that either they give her free samples or if she has a prescription they give her the insulin free. She has not had a PCP in a while and has not been able to get a prescription for her insulin. Diabetes educator to see pt to offer assistance in insulin regimen- pt would be eligible for medication assistance under the [facility] program at discharge- Discussed the [facility] program with pt regarding $3 per prescription cost. Pt also has information on [facility] and plans to follow up with paperwork on enrolling with them. Discussed [facility] for PCP follow up and pt agreeable to this- call made to clinic for f/u appointment- Feb. 3 at 4:30 with Dr. Laural Benes.- info placed on d/c instructions. NCM to cont. to follow for d/c needs

## 2012-02-20 NOTE — Progress Notes (Signed)
Inpatient Diabetes Program Recommendations  AACE/ADA: New Consensus Statement on Inpatient Glycemic Control (2013)  Target Ranges:  Prepandial:   less than 140 mg/dL      Peak postprandial:   less than 180 mg/dL (1-2 hours)      Critically ill patients:  140 - 180 mg/dL    Inpatient Diabetes Program Recommendations Insulin - Basal: Lantus 33 units daily (she will need only 23 units today bc she got 10 units this morning) Correction (SSI): decrease to SENSITIVE Insulin - Meal Coverage: add Novolog 6 with meals Outpatient Referral: Recommend patient see Dr. Standley Dakins at the [facility]  Note: Diabetes Coordinator spoke with patient concerning DKA admission.  Patient would like to continue using her insulin pump and Apidra if she can continue to get it from the Health Dept.  Informed patient that she can get Regular insulin from any pharmacy in a crisis situation without a prescription.  Instructed her to give Regular insulin 30 minutes before meals if she needs to use it.  It's onset is not as rapid as Novolog, Humalog or Apidra.  The patient did not have any further questions/concerns at this time.  Will follow. Thank you  Piedad Climes BSN, RN,CDE Inpatient Diabetes Coordinator [phone] (team pager)

## 2012-02-20 NOTE — Progress Notes (Signed)
Utilization review completed.  

## 2012-02-20 NOTE — Progress Notes (Signed)
[facility] Progress Note [place] TEAM 1 - Stepdown/ICU TEAM   [name] WUJ:[id number] DOB: July 12, 1990 DOA: 02/19/2012 PCP: Default, Provider, MD  Brief narrative: 22 year old female patient with 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 She was recently hospitalized at this facility in December 2013 with DKA. She usually obtains her insulin from the health department-apparently obtained samples. Unfortunately this past week she had difficulty obtaining her insulin therefore went without insulin for a prolonged period of time and developed DKA. She also developed associated hyperglycemic related gastroparesis (nausea and vomiting) prior to admission. She also had diffuse myalgias and dehydration and chest discomfort. When she presented to the emergency department her CBG was greater than 600. She was subsequently started on an insulin infusion and admitted to the step down unit  Assessment/Plan: Principal Problem:  *DKA (diabetic ketoacidoses) -Anion gap closed - transitioned to Lantus  -Per d/w diabetes coordinator: add'l Lantus 23 units NOW to equal home basal needs- increase meal coverage and decrease SSI -Pt interested in Urgent Care Clinic to be followed by diabetologist Dr. Laural Benes- diabetes coordinator will try to arrange appointment 1/24 -Pt aware if runs out of Apidra can obtain REGULAR insulin at ANY pharmacy w/o a prescription -Hemoglobin A1c 8.2 c/w a mean plasma glucose of 189  Active Problems:  Dehydration/Sinus tachycardia -Clinically dehydrated 2/2 to recent DKA -normal saline bolus x1 then normal saline at 100 cc per hour   Leukocytosis -No signs of infection- leukocytosis 2/2 DKA -Check CBC in a.m.   GERD (gastroesophageal reflux disease) - ? 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 No abnormal pain. No shortness of breath or chest pain or dizziness.   Objective: Blood pressure 120/65, pulse 106, temperature 98.3 F (36.8 C), temperature source Oral, resp. rate 14, height 5\' 7"  (1.702 m), weight 75 kg (165 lb 5.5 oz), last menstrual period 02/04/2012, SpO2 99.00%.  Intake/Output Summary (Last 24 hours) at 02/20/12 1318 Last data filed at 02/20/12 1212  Gross per 24 hour  Intake 1674.7 ml  Output    502 ml  Net 1172.7 ml     Exam: General: No acute respiratory distress Lungs: Clear to auscultation bilaterally without wheezes or crackles Cardiovascular: Regular rate and rhythm without murmur gallop or rub normal S1 and S2, sinus tachycardia Abdomen: Nontender, nondistended, soft, bowel sounds positive, no rebound, no ascites, no appreciable mass Extremities: No significant cyanosis, clubbing, or edema bilateral lower extremities  Data Reviewed: Basic Metabolic Panel:  Lab 02/20/12 9562 02/20/12 0253 02/20/12 0100 02/19/12 2305 02/19/12 2134  NA 139 136 135 134* 134*  K 4.4 4.2 4.2 4.4 4.7  CL 107 105 103 103 100  CO2 21 21 20 19  15*  GLUCOSE 143* 170* 284* 332* 452*  BUN 12 14 16 18 22   CREATININE 0.71 0.75 0.73 0.80 0.89  CALCIUM 8.9 8.6 8.4 8.4 8.5  MG -- -- -- -- --  PHOS -- -- -- -- --   Liver Function Tests:  Lab 02/19/12 1644  AST 34  ALT 89*  ALKPHOS 103  BILITOT 0.2*  PROT 8.1  ALBUMIN 4.5   No results found for this basename: LIPASE:5,AMYLASE:5 in the last 168 hours No results found for this basename: AMMONIA:5 in the last 168 hours CBC:  Lab 02/20/12 0253 02/19/12 1644 02/19/12 1612  WBC 27.9* 39.4* --  NEUTROABS -- 34.6* --  HGB 11.5* 12.7 15.6*  HCT 33.0* 39.7 46.0  MCV 87.5 95.2 --  PLT 301 406* --   Cardiac Enzymes:  Lab 02/20/12 0253 02/19/12 2134  CKTOTAL  -- --  CKMB -- --  CKMBINDEX -- --  TROPONINI <0.30 <0.30   BNP (last 3 results) No results found for this basename: PROBNP:3 in the last 8760 hours CBG:  Lab 02/20/12 1209 02/20/12 0956 02/20/12 0750 02/20/12 0646 02/20/12 0542  GLUCAP 263* 285* 111* 118* 125*    Recent Results (from the past 240 hour(s))  MRSA PCR SCREENING     Status: Normal   Collection Time   02/19/12  9:26 PM      Component Value Range Status Comment   MRSA by PCR NEGATIVE  NEGATIVE Final      Studies:  Recent x-ray studies have been reviewed in detail by the Attending Physician  Scheduled Meds:  Reviewed in detail by the Attending Physician   Junious Silk, ANP [facility] Office  [phone] Pager [phone]  On-Call/Text Page:      [email or web address]      password [email or web address]  If 7PM-7AM, please contact night-coverage [email or web address] Password [email or web address] 02/20/2012, 1:18 PM   LOS: 1 day   I have examined the patient, reviewed the chart and modified the above note which I agree with.   Magenta Schmiesing,MD [phone] 02/20/2012, 5:24 PM

## 2012-02-21 LAB — BASIC METABOLIC PANEL
BUN: 9 mg/dL (ref 6–23)
Calcium: 8.7 mg/dL (ref 8.4–10.5)
GFR calc Af Amer: >90 mL/min (ref >90)
GFR calc non Af Amer: >90 mL/min (ref >90)
Potassium: 4.1 mEq/L (ref 3.5–5.1)

## 2012-02-21 LAB — CBC
Hemoglobin: 11.6 g/dL — ABNORMAL LOW (ref 12.0–15.0)
MCHC: 33.3 g/dL (ref 30.0–36.0)
Platelets: 262 10*3/uL (ref 150–400)
RDW: 13.5 % (ref 11.5–15.5)

## 2012-02-21 LAB — GLUCOSE, CAPILLARY: Glucose-Capillary: 274 mg/dL — ABNORMAL HIGH (ref 70–99)

## 2012-02-21 MED ORDER — INSULIN GLARGINE 100 UNIT/ML ~~LOC~~ SOLN
8.0000 [IU] | Freq: Once | SUBCUTANEOUS | Status: AC
  Administered 2012-02-21: 8 [IU] via SUBCUTANEOUS

## 2012-02-21 MED ORDER — INSULIN GLARGINE 100 UNIT/ML ~~LOC~~ SOLN
40.0000 [IU] | Freq: Every day | SUBCUTANEOUS | Status: DC
  Administered 2012-02-21: 40 [IU] via SUBCUTANEOUS

## 2012-02-21 NOTE — Progress Notes (Signed)
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 She was recently hospitalized at this facility in December 2013 with DKA. She usually obtains her insulin from the health department-apparently obtained samples. Unfortunately this past week she had difficulty obtaining her insulin therefore went without insulin for a prolonged period of time and developed DKA. She also developed associated hyperglycemic related gastroparesis (nausea and vomiting) prior to admission. She also had diffuse myalgias and dehydration and chest discomfort. When she presented to the emergency department her CBG was greater than 600. She was subsequently started on an insulin infusion and admitted to the step down unit  Assessment/Plan:  Principal Problem:  *DKA (diabetic ketoacidoses)  -Anion gap closed - transitioned to Lantus  -Per d/w diabetes coordinator: add'l Lantus 23 units equals home basal needs-her CBGs were still elevated therefore her Lantus has been increased to 40 units at bedtime will give an additional 8 units now -Pt interested in [facility]gent Care Clinic to be followed by diabetologist Dr. Laural Benes- diabetes coordinator will try to arrange appointment 1/24  -Pt aware if runs out of Apidra can obtain REGULAR insulin at ANY pharmacy w/o a prescription  -Hemoglobin A1c 8.2 c/w a mean plasma glucose of 189 . This appointment has been set up     Dehydration/Sinus tachycardia  -Clinically dehydrated 2/2 to recent DKA  -normal saline bolus x1 then normal saline at 100 cc per hour   Leukocytosis  -No signs of infection- leukocytosis 2/2 DKA    -Check CBC in a.m.  GERD (gastroesophageal reflux disease)  - ? related to undiagnosed diabetic gastroparesis- control sugars  DEPRESSION  -Continue Celexa    DVT prophylaxis: Lovenox  Code Status: Full  Family Communication: Spoke with patient  Disposition Plan: Possible discharge tomorrow Consultants:  None  Procedures:  None  Antibiotics:  None  HPI/Subjective:  Patient complained of nausea and anorexia this morning. No abnormal pain. No shortness of breath or chest pain or dizziness  Objective: Filed Vitals:   02/20/12 2015 02/20/12 2309 02/21/12 0510 02/21/12 1032  BP: 121/73 124/85 142/88 125/74  Pulse: 100 94 80 64  Temp: 99.3 F (37.4 C) 98.7 F (37.1 C) 98.5 F (36.9 C) 98.7 F (37.1 C)  TempSrc: Oral Oral Oral   Resp: 20 20 20 18   Height: 5\' 7"  (1.702 m)     Weight: 74.999 kg (165 lb 5.5 oz)     SpO2: 99% 100% 100% 99%    Intake/Output Summary (Last 24 hours) at 02/21/12 1246 Last data filed at 02/21/12 1032  Gross per 24 hour  Intake 3223.33 ml  Output      0 ml  Net 3223.33 ml    Exam:  HENT:  Head: Atraumatic.  Nose: Nose normal.  Mouth/Throat: Oropharynx is clear and moist.  Eyes: Conjunctivae are normal. Pupils are equal, round, and reactive to light. No scleral icterus.  Neck: Neck supple. No tracheal deviation present.  Cardiovascular: Normal rate, regular rhythm, normal heart sounds and intact distal pulses.  Pulmonary/Chest: Effort normal and breath sounds normal. No respiratory distress.  Abdominal: Soft. Normal appearance and bowel sounds are normal. She exhibits no distension. There is no tenderness.  Musculoskeletal: She exhibits no edema and no tenderness.  Neurological: She is alert. No cranial nerve deficit.    Data Reviewed: Basic Metabolic Panel:  Lab 02/21/12 1610 02/20/12 0500 02/20/12 0253 02/20/12 0100 02/19/12 2305  NA 135 139 136 135 134*  K 4.1 4.4 4.2 4.2 4.4  CL 102 107 105 103 103  CO2 19 21 21 20 19   GLUCOSE  310* 143* 170* 284* 332*  BUN 9 12 14 16 18   CREATININE 0.70 0.71 0.75 0.73 0.80  CALCIUM 8.7 8.9 8.6 8.4 8.4  MG -- -- -- -- --  PHOS -- -- -- -- --    Liver Function Tests:  Lab 02/19/12 1644  AST 34  ALT 89*  ALKPHOS 103  BILITOT 0.2*  PROT 8.1  ALBUMIN 4.5   No results found for this basename: LIPASE:5,AMYLASE:5 in the last 168 hours No results found for this basename: AMMONIA:5 in the last 168 hours  CBC:  Lab 02/21/12 0522 02/20/12 0253 02/19/12 1644 02/19/12 1612  WBC 13.3* 27.9* 39.4* --  NEUTROABS -- -- 34.6* --  HGB 11.6* 11.5* 12.7 15.6*  HCT 34.8* 33.0* 39.7 46.0  MCV 87.7 87.5 95.2 --  PLT 262 301 406* --    Cardiac Enzymes:  Lab 02/20/12 2321 02/20/12 0253 02/19/12 2134  CKTOTAL -- -- --  CKMB -- -- --  CKMBINDEX -- -- --  TROPONINI <0.30 <0.30 <0.30   BNP (last 3 results) No results found for this basename: PROBNP:3 in the last 8760 hours   CBG:  Lab 02/21/12 1146 02/21/12 0734 02/20/12 2257 02/20/12 2215 02/20/12 1626  GLUCAP 274* 326* 292* 316* 135*    Recent Results (from the past 240 hour(s))  MRSA PCR SCREENING     Status: Normal   Collection Time   02/19/12  9:26 PM      Component Value Range Status Comment   MRSA by PCR NEGATIVE  NEGATIVE Final      Studies: Dg Chest 2 View  02/19/2012  *RADIOLOGY REPORT*  Clinical Data: Chest pain and nausea.  CHEST - 2 VIEW  Comparison: PA and lateral chest 05/05/2010.  Findings: Lungs clear.  Heart size normal.  No pneumothorax or pleural fluid.  IMPRESSION: Negative chest.   Original Report Authenticated By: Holley Dexter, M.D.     Scheduled Meds:   . citalopram  10 mg Oral Daily  . enoxaparin (LOVENOX) injection  40 mg Subcutaneous Q24H  . ferrous sulfate  325 mg Oral BID WC  . insulin aspart  0-5 Units Subcutaneous QHS  . insulin aspart  0-9 Units Subcutaneous TID WC  . insulin aspart  6 Units Subcutaneous TID WC  . insulin glargine  40 Units Subcutaneous QHS  . insulin glargine  8  Units Subcutaneous Once  . ondansetron (ZOFRAN) IV  4 mg Intravenous Once  . pantoprazole  20 mg Oral Daily   Continuous Infusions:   . sodium chloride 100 mL/hr at 02/21/12 9604    Principal Problem:  *DKA (diabetic ketoacidoses) Active Problems:  DEPRESSION  Leukocytosis  GERD (gastroesophageal reflux disease)  Dehydration  Sinus tachycardia    Time spent: 40 minutes   [facility]  [facility] Pager [phone]. If 8PM-8AM, please contact night-coverage at [email or web address], password [facility] 02/21/2012, 12:46 PM  LOS: 2 days

## 2012-02-21 NOTE — Progress Notes (Addendum)
Inpatient Diabetes Program Recommendations  AACE/ADA: New Consensus Statement on Inpatient Glycemic Control (2013)  Target Ranges:  Prepandial:   less than 140 mg/dL      Peak postprandial:   less than 180 mg/dL (1-2 hours)      Critically ill patients:  140 - 180 mg/dL   Inpatient Diabetes Program Recommendations Insulin - Basal: Increase Lantus to 39 units  Correction (SSI): . Insulin - Meal Coverage: . Outpatient Referral: Recommend patient see Dr. Standley Dakins at the [facility] Appointment made with Dr. Laural Benes for 02/28/12 at 12:00 pm. Cost will be $20 for visit.  Information given to patient.   Thank you  Piedad Climes BSN, RN,CDE Inpatient Diabetes Coordinator [phone] (team pager)

## 2012-02-21 NOTE — Care Management Note (Signed)
Noted that pt needed assistance with medications, no insurance. MATCH letter prepared for pt to take with prescriptions at time of d/c, pt RN , Harriett Sine B ware of letter. Letter placed in shadow chart, to be given to pt at time of  D/c.  Johny Shock RN MPH Case Manager [phone]

## 2012-02-22 DIAGNOSIS — E1110 Type 2 diabetes mellitus with ketoacidosis without coma: Secondary | ICD-10-CM

## 2012-02-22 LAB — GLUCOSE, CAPILLARY
Glucose-Capillary: 259 mg/dL — ABNORMAL HIGH (ref 70–99)
Glucose-Capillary: 130 mg/dL — ABNORMAL HIGH (ref 70–99)
Glucose-Capillary: 55 mg/dL — ABNORMAL LOW (ref 70–99)
Glucose-Capillary: 71 mg/dL (ref 70–99)

## 2012-02-22 MED ORDER — INSULIN GLARGINE 100 UNIT/ML ~~LOC~~ SOLN: 45.0000 [IU] | Freq: Every day | SUBCUTANEOUS | Status: DC

## 2012-02-22 MED ORDER — INSULIN ASPART 100 UNIT/ML ~~LOC~~ SOLN: 6.0000 [IU] | Freq: Three times a day (TID) | SUBCUTANEOUS | Status: DC

## 2012-02-22 MED ORDER — INFLUENZA VIRUS VACC SPLIT PF IM SUSP
0.5000 mL | Freq: Once | INTRAMUSCULAR | Status: AC
  Administered 2012-02-22: 0.5 mL via INTRAMUSCULAR
  Filled 2012-02-22: qty 0.5

## 2012-02-22 NOTE — Progress Notes (Signed)
Patient stable without complaints.  Discharge instructions reviewed and prescriptions delivered to patient.  She verbalized her understanding of the next medical appointment, what to do with the application for her [facility] card, and the changes being made to her insulin regimen.  Her peripheral IV was DC'd, catheter intact.  She was transported to the exit by hospital staff and left with her mother by car.  Anna Dennis

## 2012-02-22 NOTE — Discharge Summary (Signed)
Physician Discharge Summary  Anna Dennis MRN: [id number] DOB/AGE: 07-02-1990 21 y.o.  PCP: Default, Provider, MD   Admit date: 02/19/2012 Discharge date: 02/22/2012  Discharge Diagnoses:       *DKA (diabetic ketoacidoses) Active Problems:  DEPRESSION  Leukocytosis  GERD (gastroesophageal reflux disease)  Dehydration  Sinus tachycardia     Medication List     As of 02/22/2012 10:24 AM    STOP taking these medications         insulin glulisine 100 UNIT/ML injection   Commonly known as: APIDRA      TAKE these medications         citalopram 10 MG tablet   Commonly known as: CELEXA   Take 10 mg by mouth daily.      insulin aspart 100 UNIT/ML injection   Commonly known as: novoLOG   Inject 6 Units into the skin 3 (three) times daily with meals.      insulin glargine 100 UNIT/ML injection   Commonly known as: LANTUS   Inject 45 Units into the skin at bedtime.      IRON PO   Take 1 tablet by mouth daily.      lansoprazole 15 MG capsule   Commonly known as: PREVACID   Take 15 mg by mouth daily.      ORTHO TRI-CYCLEN (28) 0.18/0.215/0.25 MG-35 MCG tablet   Generic drug: Norgestimate-Ethinyl Estradiol Triphasic   Take 1 tablet by mouth daily.        Discharge Condition: Stable  Disposition: 04-Intermediate Care Facility   Consults: None   Significant Diagnostic Studies: Dg Chest 2 View  02/19/2012  *RADIOLOGY REPORT*  Clinical Data: Chest pain and nausea.  CHEST - 2 VIEW  Comparison: PA and lateral chest 05/05/2010.  Findings: Lungs clear.  Heart size normal.  No pneumothorax or pleural fluid.  IMPRESSION: Negative chest.   Original Report Authenticated By: Holley Dexter, M.D.           Microbiology: Recent Results (from the past 240 hour(s))  MRSA PCR SCREENING     Status: Normal   Collection Time   02/19/12  9:26 PM      Component Value Range Status Comment   MRSA by PCR NEGATIVE  NEGATIVE Final      Labs: Results for orders  placed during the hospital encounter of 02/19/12 (from the past 48 hour(s))  GLUCOSE, CAPILLARY     Status: Abnormal   Collection Time   02/20/12 12:09 PM      Component Value Range Comment   Glucose-Capillary 263 (*) 70 - 99 mg/dL    Comment 1 Documented in Chart      Comment 2 Notify RN     GLUCOSE, CAPILLARY     Status: Abnormal   Collection Time   02/20/12  4:26 PM      Component Value Range Comment   Glucose-Capillary 135 (*) 70 - 99 mg/dL    Comment 1 Documented in Chart      Comment 2 Notify RN     GLUCOSE, CAPILLARY     Status: Abnormal   Collection Time   02/20/12 10:15 PM      Component Value Range Comment   Glucose-Capillary 316 (*) 70 - 99 mg/dL   GLUCOSE, CAPILLARY     Status: Abnormal   Collection Time   02/20/12 10:57 PM      Component Value Range Comment   Glucose-Capillary 292 (*) 70 - 99 mg/dL   TROPONIN I  Status: Normal   Collection Time   02/20/12 11:21 PM      Component Value Range Comment   Troponin I <0.30  <0.30 ng/mL   BASIC METABOLIC PANEL     Status: Abnormal   Collection Time   02/21/12  5:22 AM      Component Value Range Comment   Sodium 135  135 - 145 mEq/L    Potassium 4.1  3.5 - 5.1 mEq/L    Chloride 102  96 - 112 mEq/L    CO2 19  19 - 32 mEq/L    Glucose, Bld 310 (*) 70 - 99 mg/dL    BUN 9  6 - 23 mg/dL    Creatinine, Ser 1.61  0.50 - 1.10 mg/dL    Calcium 8.7  8.4 - 09.6 mg/dL    GFR calc non Af Amer >90  >90 mL/min    GFR calc Af Amer >90  >90 mL/min   CBC     Status: Abnormal   Collection Time   02/21/12  5:22 AM      Component Value Range Comment   WBC 13.3 (*) 4.0 - 10.5 K/uL    RBC 3.97  3.87 - 5.11 MIL/uL    Hemoglobin 11.6 (*) 12.0 - 15.0 g/dL    HCT 04.5 (*) 40.9 - 46.0 %    MCV 87.7  78.0 - 100.0 fL    MCH 29.2  26.0 - 34.0 pg    MCHC 33.3  30.0 - 36.0 g/dL    RDW 81.1  91.4 - 78.2 %    Platelets 262  150 - 400 K/uL   GLUCOSE, CAPILLARY     Status: Abnormal   Collection Time   02/21/12  7:34 AM      Component Value  Range Comment   Glucose-Capillary 326 (*) 70 - 99 mg/dL   GLUCOSE, CAPILLARY     Status: Abnormal   Collection Time   02/21/12 11:46 AM      Component Value Range Comment   Glucose-Capillary 274 (*) 70 - 99 mg/dL   GLUCOSE, CAPILLARY     Status: Abnormal   Collection Time   02/21/12  2:29 PM      Component Value Range Comment   Glucose-Capillary 371 (*) 70 - 99 mg/dL   GLUCOSE, CAPILLARY     Status: Abnormal   Collection Time   02/21/12  5:09 PM      Component Value Range Comment   Glucose-Capillary 277 (*) 70 - 99 mg/dL   GLUCOSE, CAPILLARY     Status: Abnormal   Collection Time   02/21/12  9:37 PM      Component Value Range Comment   Glucose-Capillary 340 (*) 70 - 99 mg/dL    Comment 1 Notify RN     GLUCOSE, CAPILLARY     Status: Abnormal   Collection Time   02/22/12  1:59 AM      Component Value Range Comment   Glucose-Capillary 259 (*) 70 - 99 mg/dL   GLUCOSE, CAPILLARY     Status: Abnormal   Collection Time   02/22/12  7:26 AM      Component Value Range Comment   Glucose-Capillary 130 (*) 70 - 99 mg/dL    Comment 1 Documented in Chart      Comment 2 Notify RN        Brief narrative:  22 year old female patient with 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 She was recently hospitalized at this facility in  December 2013 with DKA. She usually obtains her insulin from the health department-apparently obtained samples. Unfortunately this past week she had difficulty obtaining her insulin therefore went without insulin for a prolonged period of time and developed DKA. She also developed associated hyperglycemic related gastroparesis (nausea and vomiting) prior to admission. She also had diffuse myalgias and dehydration and chest discomfort. When she presented to the emergency department her CBG was greater than 600. She was subsequently started on an insulin infusion and admitted to the step down unit  Assessment/Plan:  Principal Problem:  *DKA (diabetic ketoacidoses)   -Anion gap closed - transitioned to Lantus , will require 45 units at bedtime -Per d/w diabetes coordinator: add'l Lantus 32 units equals home basal needs-her CBGs were still elevated therefore her Lantus has been increased to 45 units at bedtime with 6 units of pre-meal insulin -Pt interested in Urgent Care Clinic to be followed by diabetologist Dr. Laural Benes- diabetes coordinator will try to arrange appointment 1/24  -Pt aware if runs out of Apidra can obtain REGULAR insulin at ANY pharmacy w/o a prescription  -Hemoglobin A1c 8.2 c/w a mean plasma glucose of 189 . This appointment has been set up   Dehydration/Sinus tachycardia  Clinically resolved  Leukocytosis  -No signs of infection- leukocytosis 2/2 DKA  -Check CBC in a.m.  GERD (gastroesophageal reflux disease)  - ? related to undiagnosed diabetic gastroparesis- control sugars  DEPRESSION  -Continue Celexa        Discharge Exam:   Blood pressure 140/96, pulse 82, temperature 98.5 F (36.9 C), temperature source Oral, resp. rate 16, height 5\' 7"  (1.702 m), weight 74.999 kg (165 lb 5.5 oz), last menstrual period 02/04/2012, SpO2 100.00%.  General: Awake, Oriented x3, No acute distress.  HEENT: EOMI, dry mucous membranes  Neck: Supple  CV: S1 and S2  Lungs: Clear to ascultation bilaterally  Abdomen: Soft, Nontender, Nondistended, +bowel sounds.  Ext: Good pulses. Trace edema. No clubbing or cyanosis noted.  Neuro: Cranial Nerves II-XII grossly intact. Has 5/5 motor strength in upper and lower extremities.             Follow-up Information    Follow up with [facility]. On 02/28/2012. (appointment to see PCP- Dr. Laural Benes- at 12:00- [facility])    Contact information:   [address] [place] [address] [phone]         Signed: Richarda Overlie 02/22/2012, 10:24 AM

## 2012-02-28 ENCOUNTER — Emergency Department (INDEPENDENT_AMBULATORY_CARE_PROVIDER_SITE_OTHER)
Admission: EM | Admit: 2012-02-28 | Discharge: 2012-02-28 | Disposition: A | Discharge status: Home / Self Care | Payer: Self-pay | Source: Home / Self Care | Attending: Family Medicine | Admitting: Family Medicine

## 2012-02-28 DIAGNOSIS — E109 Type 1 diabetes mellitus without complications: Secondary | ICD-10-CM

## 2012-02-28 MED ORDER — OMEPRAZOLE 20 MG PO TBEC: 1.0000 | DELAYED_RELEASE_TABLET | Freq: Every day | ORAL | Status: DC

## 2012-02-28 MED ORDER — INSULIN GLULISINE 100 UNIT/ML ~~LOC~~ SOLN: SUBCUTANEOUS | Status: DC

## 2012-02-28 NOTE — ED Provider Notes (Signed)
History    CSN: [id number]  Arrival date & time 02/28/12  1301   First MD Initiated Contact with Patient 02/28/12 1344     CC:  Diabetes followup   HPI Patient presents today as a followup from her recent hospitalization where she was diagnosed with diabetic ketoacidosis.  The patient has had a difficult time without having medical insurance.  She is difficult time eating able to afford the insulin that she takes.  She had been on an insulin pump.  She reports that she had had suboptimal diabetes control using the insulin pump but she was able to stay out of DKA for the most part.  In addition, the patient reports that she had been out of her Lantus and NovoLog and had a difficult time getting the medication because the pharmacy was closed where she was getting samples of her insulin from the health department.  The patient was seen at the urgent care and her blood sugar was so elevated that they sent her to the emergency department where she was found to be in diabetic ketoacidosis.  The patient was treated successfully in the hospital and then transitioned to subcutaneous insulin.  The patient is currently taking about 33 units of Lantus and she is carbohydrate counting 1:5 with NovoLog and correcting over 110, 1:25.    Past Medical History  Diagnosis Date  . Diabetes mellitus     Insulin pump  . Hepatitis C   . Depression   . GERD (gastroesophageal reflux disease)   . OCD (obsessive compulsive disorder)   . Iron deficiency anemia     No past surgical history on file.  Family History  Problem Relation Age of Onset  . Bipolar disorder Mother   . Schizophrenia Sister     History  Substance Use Topics  . Smoking status: Former Games developer  . Smokeless tobacco: Not on file  . Alcohol Use: No    OB History    Grav Para Term Preterm Abortions TAB SAB Ect Mult Living                 Review of Systems  Constitutional: Positive for fatigue.  HENT: Positive for congestion.    Genitourinary: Positive for frequency.  All other systems reviewed and are negative.    Allergies  Review of patient's allergies indicates no known allergies.  Home Medications   Current Outpatient Rx  Name  Route  Sig  Dispense  Refill  . CITALOPRAM HYDROBROMIDE 10 MG PO TABS   Oral   Take 10 mg by mouth daily.         . INSULIN ASPART 100 UNIT/ML [place] SOLN   Subcutaneous   Inject 6 Units into the skin 3 (three) times daily with meals.   2 vial   5   . INSULIN GLARGINE 100 UNIT/ML [place] SOLN   Subcutaneous   Inject 45 Units into the skin at bedtime.   10 mL   2   . IRON PO   Oral   Take 1 tablet by mouth daily.         Marland Kitchen LANSOPRAZOLE 15 MG PO CPDR   Oral   Take 15 mg by mouth daily.         Marland Kitchen NORGESTIM-ETH ESTRAD TRIPHASIC 0.18/0.215/0.25 MG-35 MCG PO TABS   Oral   Take 1 tablet by mouth daily.           LMP 02/04/2012  Physical Exam  Nursing note and vitals reviewed. Constitutional:  She is oriented to person, place, and time. She appears well-developed and well-nourished. No distress.  HENT:  Head: Normocephalic and atraumatic.  Eyes: Conjunctivae normal and EOM are normal. Pupils are equal, round, and reactive to light.  Neck: Normal range of motion. Neck supple. No JVD present. No thyromegaly present.  Cardiovascular: Normal rate, regular rhythm and normal heart sounds.   Abdominal: Soft. Bowel sounds are normal.  Musculoskeletal: Normal range of motion. She exhibits no edema and no tenderness.  Lymphadenopathy:    She has no cervical adenopathy.  Neurological: She is alert and oriented to person, place, and time.  Skin: Skin is warm and dry. No rash noted. No erythema. No pallor.  Psychiatric: She has a normal mood and affect. Her behavior is normal. Judgment and thought content normal.    ED Course  Procedures (including critical care time)  Labs Reviewed - No data to display No results found.  No diagnosis found.  MDM   IMPRESSION  Poorly controlled type 1 DM  Depression  GERD  OCD  RECOMMENDATIONS / PLAN The patient is going to continue lantus and novolog until she can get restarted on her insulin pump.  She uses apidra for the insulin pump.  She has plenty of pump supplies left at home per pt.  She needs refills of her apidra and she says she will be able to get it from the health department.  Her A1c is 8.5% which is better than it usually runs.  She was encouraged to test more frequently.  She should be testing BS 5x per day.  She verbalized understanding.  Hypoglycemia precautions reviewed with patient today.   Counseled on diet choices and to avoid excess snacks without covering.  She has been having late night snacks and not covering and waking with high BS readings.  I reviewed her BS readings and most readings 130-200.  Will maintain close follow up with this patient to offer encouragement and support and to help push for better glycemic control.    FOLLOW UP 6 weeks  The patient was given clear instructions to go to ER or return to medical center if symptoms don't improve, worsen or new problems develop.  The patient verbalized understanding.  The patient was told to call to get lab results if they haven't heard anything in the next week.            Cleora Fleet, MD 02/28/12 [phone]

## 2012-03-04 MED ORDER — ESOMEPRAZOLE MAGNESIUM 40 MG PO CPDR: 40.0000 mg | DELAYED_RELEASE_CAPSULE | Freq: Every day | ORAL | Status: DC

## 2012-03-31 MED ORDER — INSULIN GLULISINE 100 UNIT/ML ~~LOC~~ SOLN: SUBCUTANEOUS | Status: DC

## 2013-05-06 ENCOUNTER — Emergency Department (INDEPENDENT_AMBULATORY_CARE_PROVIDER_SITE_OTHER)
Admission: EM | Admit: 2013-05-06 | Discharge: 2013-05-06 | Disposition: A | Discharge status: Home / Self Care | Payer: Self-pay | Source: Home / Self Care | Attending: Family Medicine | Admitting: Family Medicine

## 2013-05-06 ENCOUNTER — Encounter (HOSPITAL_COMMUNITY): Payer: Self-pay | Admitting: Emergency Medicine

## 2013-05-06 DIAGNOSIS — J302 Other seasonal allergic rhinitis: Secondary | ICD-10-CM

## 2013-05-06 DIAGNOSIS — H6691 Otitis media, unspecified, right ear: Secondary | ICD-10-CM

## 2013-05-06 DIAGNOSIS — H6690 Otitis media, unspecified, unspecified ear: Secondary | ICD-10-CM

## 2013-05-06 DIAGNOSIS — J309 Allergic rhinitis, unspecified: Secondary | ICD-10-CM

## 2013-05-06 MED ORDER — AMOXICILLIN 500 MG PO CAPS: 500.0000 mg | ORAL_CAPSULE | Freq: Three times a day (TID) | ORAL | Status: DC

## 2013-05-06 MED ORDER — CETIRIZINE HCL 10 MG PO TABS: 10.0000 mg | ORAL_TABLET | Freq: Every day | ORAL | Status: DC

## 2013-05-06 MED ORDER — FLUTICASONE PROPIONATE 50 MCG/ACT NA SUSP: 1.0000 | Freq: Two times a day (BID) | NASAL | Status: DC

## 2013-05-06 NOTE — Discharge Instructions (Signed)
Take all of medicine , use tylenol or advil for pain and fever as needed, see your doctor in 10 - 14 days for ear recheck  °

## 2013-05-06 NOTE — ED Notes (Signed)
Patient complains of congestion Stuffy nose,headache for almost ten days Has pain to her right ear

## 2013-05-06 NOTE — ED Provider Notes (Signed)
CSN: [id number]     Arrival date & time 05/06/13  1625 History   First MD Initiated Contact with Patient 05/06/13 1745     Chief Complaint  Patient presents with  . Nasal Congestion   (Consider location/radiation/quality/duration/timing/severity/associated sxs/prior Treatment) Patient is a 23 y.o. female presenting with URI. The history is provided by the patient.  URI Presenting symptoms: congestion, cough, ear pain, fever and rhinorrhea   Presenting symptoms: no sore throat   Severity:  Mild Onset quality:  Gradual Duration:  10 days Progression:  Worsening Chronicity:  New Associated symptoms: sinus pain   Risk factors: sick contacts     Past Medical History  Diagnosis Date  . Diabetes mellitus     Insulin pump  . Hepatitis C   . Depression   . GERD (gastroesophageal reflux disease)   . OCD (obsessive compulsive disorder)   . Iron deficiency anemia    History reviewed. No pertinent past surgical history. Family History  Problem Relation Age of Onset  . Bipolar disorder Mother   . Schizophrenia Sister    History  Substance Use Topics  . Smoking status: Former Games developer  . Smokeless tobacco: Not on file  . Alcohol Use: No   OB History   Grav Para Term Preterm Abortions TAB SAB Ect Mult Living                 Review of Systems  Constitutional: Positive for fever.  HENT: Positive for congestion, ear pain, postnasal drip and rhinorrhea. Negative for sore throat.   Respiratory: Positive for cough.   Cardiovascular: Negative.   Gastrointestinal: Negative.     Allergies  Review of patient's allergies indicates no known allergies.  Home Medications   Current Outpatient Rx  Name  Route  Sig  Dispense  Refill  . amoxicillin (AMOXIL) 500 MG capsule   Oral   Take 1 capsule (500 mg total) by mouth 3 (three) times daily.   30 capsule   0   . cetirizine (ZYRTEC) 10 MG tablet   Oral   Take 1 tablet (10 mg total) by mouth daily. One tab daily for allergies   30  tablet   1   . citalopram (CELEXA) 10 MG tablet   Oral   Take 10 mg by mouth daily.         Marland Kitchen esomeprazole (NEXIUM) 40 MG capsule   Oral   Take 1 capsule (40 mg total) by mouth daily before breakfast.   30 capsule   3   . fluticasone (FLONASE) 50 MCG/ACT nasal spray   Each Nare   Place 1 spray into both nostrils 2 (two) times daily.   1 g   2   . insulin glargine (LANTUS) 100 UNIT/ML injection   Subcutaneous   Inject 45 Units into the skin at bedtime.   10 mL   2   . insulin glulisine (APIDRA) 100 UNIT/ML injection      Use as directed   1 cartridge   12     6 units 3 times a day before meals   . IRON PO   Oral   Take 1 tablet by mouth daily.         . Norgestimate-Ethinyl Estradiol Triphasic (ORTHO TRI-CYCLEN, 28,) 0.18/0.215/0.25 MG-35 MCG tablet   Oral   Take 1 tablet by mouth daily.          BP 135/90  Pulse 90  Temp(Src) 98.4 F (36.9 C) (Oral)  Resp 12  SpO2 100% Physical Exam  Nursing note and vitals reviewed. Constitutional: She is oriented to person, place, and time.  HENT:  Right Ear: Ear canal normal. Tympanic membrane is injected and erythematous. Tympanic membrane mobility is abnormal.  Left Ear: Tympanic membrane and ear canal normal.  Nose: Mucosal edema and rhinorrhea present.  Mouth/Throat: Oropharynx is clear and moist.  Eyes: Pupils are equal, round, and reactive to light.  Neck: Normal range of motion. Neck supple.  Cardiovascular: Normal heart sounds.   Pulmonary/Chest: Breath sounds normal.  Lymphadenopathy:    She has no cervical adenopathy.  Neurological: She is alert and oriented to person, place, and time.  Skin: Skin is warm and dry.    ED Course  Procedures (including critical care time) Labs Review Labs Reviewed - No data to display Imaging Review No results found.   MDM   1. Seasonal allergic rhinitis   2. Otitis media of right ear       Linna HoffJames D Katalyna Socarras, MD 05/06/13 [phone]

## 2014-07-05 ENCOUNTER — Other Ambulatory Visit: Payer: No Typology Code available for payment source

## 2014-07-05 DIAGNOSIS — B182 Chronic viral hepatitis C: Secondary | ICD-10-CM

## 2014-07-05 LAB — IRON: Iron: 87 ug/dL (ref 42–145)

## 2014-07-06 LAB — ANA: ANA: NEGATIVE

## 2014-07-06 LAB — PROTIME-INR
INR: 0.95 (ref <1.50)
Prothrombin Time: 12.7 seconds (ref 11.6–15.2)

## 2014-07-06 LAB — HEPATITIS B SURFACE ANTIGEN: HEP B S AG: NEGATIVE

## 2014-07-06 LAB — HEPATITIS B CORE ANTIBODY, TOTAL: HEP B C TOTAL AB: NONREACTIVE

## 2014-07-06 LAB — HEPATITIS A ANTIBODY, TOTAL: HEP A TOTAL AB: REACTIVE — AB

## 2014-07-06 LAB — HEPATITIS B SURFACE ANTIBODY,QUALITATIVE: HEP B S AB: NEGATIVE

## 2014-07-21 ENCOUNTER — Telehealth: Payer: Self-pay | Admitting: *Deleted

## 2014-07-21 NOTE — Telephone Encounter (Signed)
error 

## 2014-08-10 ENCOUNTER — Encounter: Payer: Self-pay | Admitting: Internal Medicine

## 2014-08-10 ENCOUNTER — Ambulatory Visit (INDEPENDENT_AMBULATORY_CARE_PROVIDER_SITE_OTHER): Payer: No Typology Code available for payment source | Admitting: Internal Medicine

## 2014-08-10 ENCOUNTER — Other Ambulatory Visit: Payer: Self-pay | Admitting: Internal Medicine

## 2014-08-10 VITALS — BP 118/73 | HR 87 | Temp 98.5°F | Ht 67.0 in | Wt 189.0 lb

## 2014-08-10 DIAGNOSIS — B182 Chronic viral hepatitis C: Secondary | ICD-10-CM

## 2014-08-10 MED ORDER — ELBASVIR-GRAZOPREVIR 50-100 MG PO TABS: 1.0000 | ORAL_TABLET | Freq: Every day | ORAL | Status: DC

## 2014-08-10 NOTE — Progress Notes (Signed)
+Anna Dennis is a 24 y.o. female who presents for initial evaluation and management of a positive Hepatitis C antibody test.  Patient tested positive earlier this year. 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 Patient denies multiple sexual partners, renal dialysis, sexual contact with person with liver disease, tattoos. Patient has had other studies performed. Results: hepatitis C RNA by PCR, result: positive. Patient has not had prior treatment for Hepatitis C. Patient does not have a past history of liver disease. Patient does not have a family history of liver disease.   HPI: She is doing well off of drugs and does not have any intention of using IV drugs again.  She understands that she would be at risk of reinfection if she used again and would not likely get treatment again.   Patient does have documented immunity to Hepatitis A. Patient does not have documented immunity to Hepatitis B.     Review of Systems A comprehensive review of systems was negative.   Past Medical History  Diagnosis Date  . Diabetes mellitus     Insulin pump  . Hepatitis C   . Depression   . GERD (gastroesophageal reflux disease)   . OCD (obsessive compulsive disorder)   . Iron deficiency anemia     Prior to Admission medications   Medication Sig Start Date End Date Taking? Authorizing Provider  citalopram (CELEXA) 10 MG tablet Take 10 mg by mouth daily.   Yes Historical Provider, MD  insulin glargine (LANTUS) 100 UNIT/ML injection Inject 45 Units into the skin at bedtime. 02/22/12  Yes Richarda OverlieNayana Abrol, MD  insulin glulisine (APIDRA) 100 UNIT/ML injection Use as directed 03/31/12  Yes Richarda OverlieNayana Abrol, MD  Norgestimate-Ethinyl Estradiol Triphasic (ORTHO TRI-CYCLEN, 28,) 0.18/0.215/0.25 MG-35 MCG tablet Take 1 tablet by mouth daily.   Yes Historical Provider, MD  Elbasvir-Grazoprevir (ZEPATIER) 50-100 MG TABS Take 1 tablet by mouth  daily. 08/10/14   Gardiner Barefootobert W Lamaya Hyneman, MD    No Known Allergies  History  Substance Use Topics  . Smoking status: Former Games developermoker  . Smokeless tobacco: Never Used  . Alcohol Use: 0.0 oz/week    0 Standard drinks or equivalent per week     Comment: occasional    Family History  Problem Relation Age of Onset  . Bipolar disorder Mother   . Schizophrenia Sister       Objective:   Filed Vitals:   08/10/14 1425  BP: 118/73  Pulse: 87  Temp: 98.5 F (36.9 C)   in no apparent distress and alert HEENT: anicteric Cor RRR and No murmurs clear Bowel sounds are normal, liver is not enlarged, spleen is not enlarged peripheral pulses normal, no pedal edema, no clubbing or cyanosis negative for - jaundice, spider hemangioma, telangiectasia, palmar erythema, ecchymosis and atrophy  Laboratory Genotype: No results found for: HCVGENOTYPE HCV viral load: No results found for: HCVQUANT Lab Results  Component Value Date   WBC 13.3* 02/21/2012   HGB 11.6* 02/21/2012   HCT 34.8* 02/21/2012   MCV 87.7 02/21/2012   PLT 262 02/21/2012    Lab Results  Component Value Date   CREATININE 0.70 02/21/2012   BUN 9 02/21/2012   NA 135 02/21/2012   K 4.1 02/21/2012   CL 102 02/21/2012   CO2 19 02/21/2012    Lab Results  Component Value Date   ALT 89* 02/19/2012   AST 34 02/19/2012   ALKPHOS 103 02/19/2012  BILITOT 0.2* 02/19/2012   INR 0.95 07/05/2014      Assessment: Chronic Hepatitis C genotype 1, 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 2) Transmission discussed with patient including sexual transmission, sharing razors and toothbrush.   3) Will need referral to gastroenterology if concern for cirrhosis 4) Will need referral for substance abuse counseling: No.  Already getting counseling.  5) Will prescribe Zepatier through [facility], NS5A testing today, will do through lab  assistance program 6) Hepatitis A vaccine No. 7) Hepatitis B vaccine Yes.  , will start next visit 8) Pneumovax vaccine if concern for cirrhosis 9) will follow up after elastography

## 2014-08-15 LAB — HCV RNA NS5A DRUG RESISTANCE: HCV NS5A Subtype: NOT DETECTED

## 2014-08-25 LAB — HEPATITIS C RNA QUANTITATIVE
HCV QUANT LOG: 5.35 {Log} — AB (ref <1.18)
HCV QUANT: 223076 [IU]/mL — AB (ref <15)

## 2014-08-31 ENCOUNTER — Ambulatory Visit (HOSPITAL_COMMUNITY): Payer: No Typology Code available for payment source

## 2014-09-06 ENCOUNTER — Other Ambulatory Visit: Payer: No Typology Code available for payment source

## 2014-09-06 ENCOUNTER — Ambulatory Visit (HOSPITAL_COMMUNITY)
Admission: RE | Admit: 2014-09-06 | Discharge: 2014-09-06 | Disposition: A | Discharge status: Home / Self Care | Payer: No Typology Code available for payment source | Source: Ambulatory Visit | Attending: Internal Medicine | Admitting: Internal Medicine

## 2014-09-06 DIAGNOSIS — B182 Chronic viral hepatitis C: Secondary | ICD-10-CM

## 2014-09-12 LAB — HCV RNA NS5A DRUG RESISTANCE

## 2014-09-15 ENCOUNTER — Encounter (HOSPITAL_COMMUNITY): Payer: Self-pay | Admitting: Pharmacy Technician

## 2014-09-30 ENCOUNTER — Telehealth: Payer: Self-pay | Admitting: Lab

## 2014-09-30 NOTE — Telephone Encounter (Signed)
LM for pt to call to make appmts to see Dr Luciana Axe 6 wks after med pick up ,09/18/14, and 4 wks from med pick, the pt need a lab appmt

## 2014-10-12 ENCOUNTER — Other Ambulatory Visit: Payer: Self-pay | Admitting: *Deleted

## 2014-10-12 ENCOUNTER — Other Ambulatory Visit: Payer: No Typology Code available for payment source

## 2014-10-12 DIAGNOSIS — B1710 Acute hepatitis C without hepatic coma: Secondary | ICD-10-CM

## 2014-10-12 DIAGNOSIS — K7030 Alcoholic cirrhosis of liver without ascites: Secondary | ICD-10-CM

## 2014-10-14 LAB — HEPATITIS C RNA QUANTITATIVE: HCV Quantitative: NOT DETECTED IU/mL (ref <15)

## 2014-10-25 ENCOUNTER — Encounter: Payer: Self-pay | Admitting: Internal Medicine

## 2014-10-25 ENCOUNTER — Ambulatory Visit (INDEPENDENT_AMBULATORY_CARE_PROVIDER_SITE_OTHER): Payer: No Typology Code available for payment source | Admitting: Internal Medicine

## 2014-10-25 VITALS — BP 129/85 | HR 76 | Temp 98.2°F | Wt 183.0 lb

## 2014-10-25 DIAGNOSIS — K7400 Hepatic fibrosis, unspecified: Secondary | ICD-10-CM

## 2014-10-25 DIAGNOSIS — Z23 Encounter for immunization: Secondary | ICD-10-CM

## 2014-10-25 DIAGNOSIS — B182 Chronic viral hepatitis C: Secondary | ICD-10-CM

## 2014-10-25 NOTE — Assessment & Plan Note (Addendum)
Doing well on treatment.  No issues and will complete 12 weeks and get follow up viral load and rtc after treatment and lab.   Will start hep B series today.

## 2014-10-25 NOTE — Assessment & Plan Note (Addendum)
Discussed results.  Minimal fibrosis, will not need follow up ultrasounds.

## 2014-10-25 NOTE — Addendum Note (Signed)
Addended by: Wendall Mola A on: 10/25/2014 02:00 PM   Modules accepted: Orders

## 2014-10-25 NOTE — Progress Notes (Signed)
   Subjective:    Patient ID: [name], female    DOB: 07-31-1990, 24 y.o.   MRN: [id number]  HPI Here for follow up of HCV.  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 Started on Zepatier via [facility] and now in her second month of treatment.  No issues and viral load now undetectable.  No fatigue, no headache.  No missed doses.  Hep B non immune, hep A immune.     Review of Systems  Constitutional: Negative for fatigue.  Gastrointestinal: Negative for nausea and diarrhea.  Skin: Negative for rash.  Neurological: Negative for dizziness and headaches.       Objective:   Physical Exam  Constitutional: She appears well-developed and well-nourished. No distress.  HENT:  Mouth/Throat: No oropharyngeal exudate.  Eyes: No scleral icterus.  Cardiovascular: Normal rate, regular rhythm and normal heart sounds.   No murmur heard. Pulmonary/Chest: Effort normal and breath sounds normal. No respiratory distress.  Skin: No rash noted.          Assessment & Plan:

## 2014-11-24 ENCOUNTER — Ambulatory Visit: Payer: No Typology Code available for payment source

## 2014-12-05 ENCOUNTER — Ambulatory Visit: Payer: No Typology Code available for payment source

## 2014-12-29 ENCOUNTER — Other Ambulatory Visit: Payer: No Typology Code available for payment source

## 2014-12-29 ENCOUNTER — Ambulatory Visit (INDEPENDENT_AMBULATORY_CARE_PROVIDER_SITE_OTHER): Payer: No Typology Code available for payment source | Admitting: *Deleted

## 2014-12-29 DIAGNOSIS — Z23 Encounter for immunization: Secondary | ICD-10-CM

## 2014-12-29 DIAGNOSIS — B182 Chronic viral hepatitis C: Secondary | ICD-10-CM

## 2014-12-29 LAB — COMPLETE METABOLIC PANEL WITH GFR
ALT: 9 U/L (ref 6–29)
AST: 9 U/L — AB (ref 10–30)
Albumin: 3.7 g/dL (ref 3.6–5.1)
Alkaline Phosphatase: 50 U/L (ref 33–115)
BUN: 18 mg/dL (ref 7–25)
CALCIUM: 8.9 mg/dL (ref 8.6–10.2)
CHLORIDE: 98 mmol/L (ref 98–110)
CO2: 22 mmol/L (ref 20–31)
CREATININE: 0.77 mg/dL (ref 0.50–1.10)
GFR, Est African American: >89 mL/min (ref >=60)
GFR, Est Non African American: >89 mL/min (ref >=60)
GLUCOSE: 405 mg/dL — AB (ref 65–99)
Potassium: 4.8 mmol/L (ref 3.5–5.3)
SODIUM: 131 mmol/L — AB (ref 135–146)
Total Bilirubin: 0.5 mg/dL (ref 0.2–1.2)
Total Protein: 6.7 g/dL (ref 6.1–8.1)

## 2014-12-30 ENCOUNTER — Telehealth: Payer: Self-pay | Admitting: *Deleted

## 2014-12-30 NOTE — Telephone Encounter (Signed)
Call from [facility] stating patient has an alert glucose of 405. Result faxed and placed in Dr. Ephriam Knucklesomer's inbox. Called and informed patient and advised her to call her PCP for a possible adjustment to her insulin. She said she is waiting on a referral to the endocrinologist to get better control of her glucose and she would call her PCP. Wendall MolaJacqueline Cockerham

## 2015-01-01 LAB — HEPATITIS C RNA QUANTITATIVE: HCV QUANT: NOT DETECTED [IU]/mL (ref <15)

## 2015-01-12 ENCOUNTER — Ambulatory Visit: Payer: No Typology Code available for payment source | Admitting: Internal Medicine

## 2015-02-06 ENCOUNTER — Ambulatory Visit: Payer: No Typology Code available for payment source | Admitting: Internal Medicine

## 2015-02-08 ENCOUNTER — Telehealth: Payer: Self-pay | Admitting: *Deleted

## 2015-02-08 NOTE — Telephone Encounter (Signed)
Patient scheduled for follow up 4/3 for lab and final HBV#3 injection. Andree CossHowell, Trinty Marken M, RN

## 2015-02-08 NOTE — Telephone Encounter (Signed)
-----   Message from Gardiner Barefootobert W Comer, MD sent at 02/08/2015 10:10 AM EST ----- She was scheduled Monday. Let her know her HCV viral load after treatment is negative and I would like to check it one last time in 3 months.  thanks

## 2015-03-16 ENCOUNTER — Encounter (HOSPITAL_COMMUNITY): Payer: Self-pay | Admitting: *Deleted

## 2015-03-16 ENCOUNTER — Emergency Department (INDEPENDENT_AMBULATORY_CARE_PROVIDER_SITE_OTHER)
Admission: EM | Admit: 2015-03-16 | Discharge: 2015-03-16 | Disposition: A | Discharge status: Nursing Home (Includes ICF) | Payer: No Typology Code available for payment source | Source: Home / Self Care | Attending: Family Medicine | Admitting: Family Medicine

## 2015-03-16 DIAGNOSIS — R601 Generalized edema: Secondary | ICD-10-CM

## 2015-03-16 DIAGNOSIS — R2243 Localized swelling, mass and lump, lower limb, bilateral: Secondary | ICD-10-CM | POA: Insufficient documentation

## 2015-03-16 DIAGNOSIS — E119 Type 2 diabetes mellitus without complications: Secondary | ICD-10-CM | POA: Insufficient documentation

## 2015-03-16 LAB — URINALYSIS, ROUTINE W REFLEX MICROSCOPIC
Bilirubin Urine: NEGATIVE
Hgb urine dipstick: NEGATIVE
Ketones, ur: NEGATIVE mg/dL
LEUKOCYTES UA: NEGATIVE
NITRITE: NEGATIVE
PH: 7.5 (ref 5.0–8.0)
Protein, ur: NEGATIVE mg/dL
SPECIFIC GRAVITY, URINE: 1.018 (ref 1.005–1.030)

## 2015-03-16 LAB — POCT I-STAT, CHEM 8
BUN: 13 mg/dL (ref 6–20)
CHLORIDE: 99 mmol/L — AB (ref 101–111)
Calcium, Ion: 0.99 mmol/L — ABNORMAL LOW (ref 1.12–1.23)
Creatinine, Ser: 0.5 mg/dL (ref 0.44–1.00)
GLUCOSE: 343 mg/dL — AB (ref 65–99)
HEMATOCRIT: 40 % (ref 36.0–46.0)
Hemoglobin: 13.6 g/dL (ref 12.0–15.0)
POTASSIUM: 4.3 mmol/L (ref 3.5–5.1)
SODIUM: 136 mmol/L (ref 135–145)
TCO2: 29 mmol/L (ref 0–100)

## 2015-03-16 LAB — BASIC METABOLIC PANEL
ANION GAP: 10 (ref 5–15)
BUN: 9 mg/dL (ref 6–20)
CHLORIDE: 101 mmol/L (ref 101–111)
CO2: 27 mmol/L (ref 22–32)
Calcium: 9.4 mg/dL (ref 8.9–10.3)
Creatinine, Ser: 0.61 mg/dL (ref 0.44–1.00)
GFR calc Af Amer: >60 mL/min (ref >60)
GLUCOSE: 91 mg/dL (ref 65–99)
POTASSIUM: 4 mmol/L (ref 3.5–5.1)
SODIUM: 138 mmol/L (ref 135–145)

## 2015-03-16 LAB — POCT URINALYSIS DIP (DEVICE)
Bilirubin Urine: NEGATIVE
Glucose, UA: 500 mg/dL — AB
Hgb urine dipstick: NEGATIVE
Ketones, ur: NEGATIVE mg/dL
Leukocytes, UA: NEGATIVE
Nitrite: NEGATIVE
Protein, ur: NEGATIVE mg/dL
Specific Gravity, Urine: 1.015 (ref 1.005–1.030)
Urobilinogen, UA: 1 mg/dL (ref 0.0–1.0)
pH: 7.5 (ref 5.0–8.0)

## 2015-03-16 LAB — CBC
HEMATOCRIT: 38.6 % (ref 36.0–46.0)
HEMOGLOBIN: 12.1 g/dL (ref 12.0–15.0)
MCH: 28 pg (ref 26.0–34.0)
MCHC: 31.3 g/dL (ref 30.0–36.0)
MCV: 89.4 fL (ref 78.0–100.0)
Platelets: 281 10*3/uL (ref 150–400)
RBC: 4.32 MIL/uL (ref 3.87–5.11)
RDW: 13.2 % (ref 11.5–15.5)
WBC: 6.8 10*3/uL (ref 4.0–10.5)

## 2015-03-16 LAB — URINE MICROSCOPIC-ADD ON: RBC / HPF: NONE SEEN RBC/hpf (ref 0–5)

## 2015-03-16 NOTE — ED Notes (Signed)
Called for patient in waiting room, no answer.

## 2015-03-16 NOTE — ED Notes (Signed)
MD at bedside. 

## 2015-03-16 NOTE — ED Notes (Signed)
Patient is resting comfortably. 

## 2015-03-16 NOTE — ED Notes (Signed)
Patient to ED for eval of bilateral leg swelling x 1 week. Patient states had only felt like it had reached knees but today felt like went into thighs. Denies sob. Type 1 diabetic, sugar elevated today in 360s. On exam legs are very tight and patient reports pain.

## 2015-03-16 NOTE — ED Notes (Signed)
No answer in lobby x 2.

## 2015-03-16 NOTE — ED Notes (Signed)
Blank note 

## 2015-03-16 NOTE — ED Notes (Signed)
Pt  Has   Swelling  Of  Both  Legs      X  1   Week     Reports         Pt    Reports        Some  Pain         She reports       She has  Been  Elevating  Her  Legs

## 2015-03-16 NOTE — ED Provider Notes (Addendum)
CSN: [id number]     Arrival date & time 03/16/15  1431 History   First MD Initiated Contact with Patient 03/16/15 1646     Chief Complaint  Patient presents with  . Leg Swelling   (Consider location/radiation/quality/duration/timing/severity/associated sxs/prior Treatment) Patient is a 25 y.o. female presenting with general illness.  Illness Severity:  Moderate Onset quality:  Gradual Duration:  1 week Progression:  Worsening Chronicity:  New Context:  H/o hep c and diabetes, no prior h/o edema. swelling is bilat. Associated symptoms: no chest pain, no fever, no nausea, no shortness of breath, no vomiting and no wheezing     Past Medical History  Diagnosis Date  . Diabetes mellitus     Insulin pump  . Hepatitis C   . Depression   . GERD (gastroesophageal reflux disease)   . OCD (obsessive compulsive disorder)   . Iron deficiency anemia    History reviewed. No pertinent past surgical history. Family History  Problem Relation Age of Onset  . Bipolar disorder Mother   . Schizophrenia Sister    Social History  Substance Use Topics  . Smoking status: Former Games developer  . Smokeless tobacco: Never Used  . Alcohol Use: 0.0 oz/week    0 Standard drinks or equivalent per week     Comment: occasional   OB History    No data available     Review of Systems  Constitutional: Negative.  Negative for fever.  HENT: Negative.   Respiratory: Negative for chest tightness, shortness of breath and wheezing.   Cardiovascular: Positive for leg swelling. Negative for chest pain and palpitations.  Gastrointestinal: Negative for nausea and vomiting.  All other systems reviewed and are negative.   Allergies  Review of patient's allergies indicates no known allergies.  Home Medications   Prior to Admission medications   Medication Sig Start Date End Date Taking? Authorizing Provider  citalopram (CELEXA) 10 MG tablet Take 10 mg by mouth daily.    Historical Provider, MD   Elbasvir-Grazoprevir (ZEPATIER) 50-100 MG TABS Take 1 tablet by mouth daily. 08/10/14   Gardiner Barefoot, MD  insulin glargine (LANTUS) 100 UNIT/ML injection Inject 45 Units into the skin at bedtime. 02/22/12   Richarda Overlie, MD  insulin glulisine (APIDRA) 100 UNIT/ML injection Use as directed 03/31/12   Richarda Overlie, MD  Norgestimate-Ethinyl Estradiol Triphasic (ORTHO TRI-CYCLEN, 28,) 0.18/0.215/0.25 MG-35 MCG tablet Take 1 tablet by mouth daily.    Historical Provider, MD   Meds Ordered and Administered this Visit  Medications - No data to display  BP 154/99 mmHg  Pulse 84  Temp(Src) 98.1 F (36.7 C) (Oral)  Resp 16  SpO2 97%  LMP 03/10/2015 No data found.   Physical Exam  Constitutional: She is oriented to person, place, and time. She appears well-developed and well-nourished. She appears distressed.  Neck: Normal range of motion. Neck supple.  Cardiovascular: Normal rate, regular rhythm, normal heart sounds and intact distal pulses.   Pulmonary/Chest: Effort normal and breath sounds normal.  Abdominal: Soft. Normal appearance and bowel sounds are normal. She exhibits no mass. There is no hepatosplenomegaly. There is no tenderness. There is no CVA tenderness.  Musculoskeletal: She exhibits edema.  bilat 2+lower ext pitting edema, no cellulitis.  Lymphadenopathy:    She has no cervical adenopathy.  Neurological: She is alert and oriented to person, place, and time.  Skin: Skin is warm and dry.  Nursing note and vitals reviewed.   ED Course  Procedures (including critical care time)  Labs Review Labs Reviewed  POCT URINALYSIS DIP (DEVICE) - Abnormal; Notable for the following:    Glucose, UA 500 (*)    All other components within normal limits  POCT I-STAT, CHEM 8 - Abnormal; Notable for the following:    Chloride 99 (*)    Glucose, Bld 343 (*)    Calcium, Ion 0.99 (*)    All other components within normal limits   i-stat - bs 343,   U/a neg x for glucose  Imaging  Review No results found.   Visual Acuity Review  Right Eye Distance:   Left Eye Distance:   Bilateral Distance:    Right Eye Near:   Left Eye Near:    Bilateral Near:         MDM   1. Generalized edema    Sent for eval of worsening bilat leg edema, poor diabetes control.    Linna Hoff, MD 03/16/15 1658  Linna Hoff, MD 03/16/15 [phone]

## 2015-03-16 NOTE — ED Notes (Signed)
Pt       Advised    To  Be  Transferred     As      Advised    But  She   Stated      That      She   Had  Something  To  Do     And  Would  Return to      [facility]  Later   Tonight

## 2015-03-17 ENCOUNTER — Emergency Department (HOSPITAL_COMMUNITY)
Admission: EM | Admit: 2015-03-17 | Discharge: 2015-03-17 | Disposition: A | Discharge status: Home / Self Care | Payer: No Typology Code available for payment source | Attending: Emergency Medicine | Admitting: Emergency Medicine

## 2015-03-17 NOTE — ED Notes (Signed)
Pts name called for a room no answer 

## 2015-03-29 ENCOUNTER — Ambulatory Visit: Payer: No Typology Code available for payment source

## 2015-03-29 ENCOUNTER — Other Ambulatory Visit: Payer: No Typology Code available for payment source

## 2015-05-01 ENCOUNTER — Ambulatory Visit (INDEPENDENT_AMBULATORY_CARE_PROVIDER_SITE_OTHER): Payer: Self-pay | Admitting: *Deleted

## 2015-05-01 ENCOUNTER — Other Ambulatory Visit: Payer: Self-pay

## 2015-05-01 DIAGNOSIS — Z23 Encounter for immunization: Secondary | ICD-10-CM

## 2015-05-01 DIAGNOSIS — B182 Chronic viral hepatitis C: Secondary | ICD-10-CM

## 2015-05-02 LAB — HEPATITIS C RNA QUANTITATIVE: HCV Quantitative: NOT DETECTED IU/mL (ref <15)

## 2015-05-05 ENCOUNTER — Encounter (HOSPITAL_COMMUNITY): Payer: Self-pay | Admitting: Emergency Medicine

## 2015-05-05 ENCOUNTER — Ambulatory Visit (HOSPITAL_COMMUNITY)
Admission: EM | Admit: 2015-05-05 | Discharge: 2015-05-05 | Disposition: A | Discharge status: Home / Self Care | Payer: Self-pay | Attending: Emergency Medicine | Admitting: Emergency Medicine

## 2015-05-05 DIAGNOSIS — S01111A Laceration without foreign body of right eyelid and periocular area, initial encounter: Secondary | ICD-10-CM

## 2015-05-05 MED ORDER — HYDROCODONE-ACETAMINOPHEN 5-325 MG PO TABS: 1.0000 | ORAL_TABLET | ORAL | Status: DC | PRN

## 2015-05-05 MED ORDER — LIDOCAINE-EPINEPHRINE (PF) 2 %-1:200000 IJ SOLN
INTRAMUSCULAR | Status: AC
  Filled 2015-05-05: qty 20

## 2015-05-05 NOTE — Discharge Instructions (Signed)
Laceration Care, Adult °A laceration is a cut that goes through all layers of the skin. The cut also goes into the tissue that is right under the skin. Some cuts heal on their own. Others need to be closed with stitches (sutures), staples, skin adhesive strips, or wound glue. Taking care of your cut lowers your risk of infection and helps your cut to heal better. °HOW TO TAKE CARE OF YOUR CUT °For stitches or staples: °· Keep the wound clean and dry. °· If you were given a bandage (dressing), you should change it at least one time per day or as told by your doctor. You should also change it if it gets wet or dirty. °· Keep the wound completely dry for the first 24 hours or as told by your doctor. After that time, you may take a shower or a bath. However, make sure that the wound is not soaked in water until after the stitches or staples have been removed. °· Clean the wound one time each day or as told by your doctor: °· Wash the wound with soap and water. °· Rinse the wound with water until all of the soap comes off. °· Pat the wound dry with a clean towel. Do not rub the wound. °· After you clean the wound, put a thin layer of antibiotic ointment on it as told by your doctor. This ointment: °· Helps to prevent infection. °· Keeps the bandage from sticking to the wound. °· Have your stitches or staples removed as told by your doctor. °If your doctor used skin adhesive strips:  °· Keep the wound clean and dry. °· If you were given a bandage, you should change it at least one time per day or as told by your doctor. You should also change it if it gets dirty or wet. °· Do not get the skin adhesive strips wet. You can take a shower or a bath, but be careful to keep the wound dry. °· If the wound gets wet, pat it dry with a clean towel. Do not rub the wound. °· Skin adhesive strips fall off on their own. You can trim the strips as the wound heals. Do not remove any strips that are still stuck to the wound. They will  fall off after a while. °If your doctor used wound glue: °· Try to keep your wound dry, but you may briefly wet it in the shower or bath. Do not soak the wound in water, such as by swimming. °· After you take a shower or a bath, gently pat the wound dry with a clean towel. Do not rub the wound. °· Do not do any activities that will make you really sweaty until the skin glue has fallen off on its own. °· Do not apply liquid, cream, or ointment medicine to your wound while the skin glue is still on. °· If you were given a bandage, you should change it at least one time per day or as told by your doctor. You should also change it if it gets dirty or wet. °· If a bandage is placed over the wound, do not let the tape for the bandage touch the skin glue. °· Do not pick at the glue. The skin glue usually stays on for 5-10 days. Then, it falls off of the skin. °General Instructions  °· To help prevent scarring, make sure to cover your wound with sunscreen whenever you are outside after stitches are removed, after adhesive strips are removed,   or when wound glue stays in place and the wound is healed. Make sure to wear a sunscreen of at least 30 SPF.  Take over-the-counter and prescription medicines only as told by your doctor.  If you were given antibiotic medicine or ointment, take or apply it as told by your doctor. Do not stop using the antibiotic even if your wound is getting better.  Do not scratch or pick at the wound.  Keep all follow-up visits as told by your doctor. This is important.  Check your wound every day for signs of infection. Watch for:  Redness, swelling, or pain.  Fluid, blood, or pus.  Raise (elevate) the injured area above the level of your heart while you are sitting or lying down, if possible. GET HELP IF:  You got a tetanus shot and you have any of these problems at the injection site:  Swelling.  Very bad pain.  Redness.  Bleeding.  You have a fever.  A wound that was  closed breaks open.  You notice a bad smell coming from your wound or your bandage.  You notice something coming out of the wound, such as wood or glass.  Medicine does not help your pain.  You have more redness, swelling, or pain at the site of your wound.  You have fluid, blood, or pus coming from your wound.  You notice a change in the color of your skin near your wound.  You need to change the bandage often because fluid, blood, or pus is coming from the wound.  You start to have a new rash.  You start to have numbness around the wound. GET HELP RIGHT AWAY IF:  You have very bad swelling around the wound.  Your pain suddenly gets worse and is very bad.  You notice painful lumps near the wound or on skin that is anywhere on your body.  You have a red streak going away from your wound.  The wound is on your hand or foot and you cannot move a finger or toe like you usually can.  The wound is on your hand or foot and you notice that your fingers or toes look pale or bluish.   This information is not intended to replace advice given to you by your health care provider. Make sure you discuss any questions you have with your health care provider.   Document Released: 07/03/2007 Document Revised: 05/31/2014 Document Reviewed: 01/10/2014 Elsevier Interactive Patient Education 2016 [employer]  Facial Laceration A facial laceration is a cut on the face. These injuries can be painful and cause bleeding. Some cuts may need to be closed with stitches (sutures), skin adhesive strips, or wound glue. Cuts usually heal quickly but can leave a scar. It can take 1-2 years for the scar to go away completely. HOME CARE   Only take medicines as told by your doctor.  Follow your doctor's instructions for wound care. For Stitches:  Keep the cut clean and dry.  If you have a bandage (dressing), change it at least once a day. Change the bandage if it gets wet or dirty, or as told by  your doctor.  Wash the cut with soap and water 2 times a day. Rinse the cut with water. Pat it dry with a clean towel.  Put a thin layer of medicated cream on the cut as told by your doctor.  You may shower after the first 24 hours. Do not soak the cut in water until the  stitches are removed. °· Have your stitches removed as told by your doctor. °· Do not wear any makeup until a few days after your stitches are removed. °For Skin Adhesive Strips: °· Keep the cut clean and dry. °· Do not get the strips wet. You may take a bath, but be careful to keep the cut dry. °· If the cut gets wet, pat it dry with a clean towel. °· The strips will fall off on their own. Do not remove the strips that are still stuck to the cut. °For Wound Glue: °· You may shower or take baths. Do not soak or scrub the cut. Do not swim. Avoid heavy sweating until the glue falls off on its own. After a shower or bath, pat the cut dry with a clean towel. °· Do not put medicine or makeup on your cut until the glue falls off. °· If you have a bandage, do not put tape over the glue. °· Avoid lots of sunlight or tanning lamps until the glue falls off. °· The glue will fall off on its own in 5-10 days. Do not pick at the glue. °After Healing: °· Put sunscreen on the cut for the first year to reduce your scar. °GET HELP IF: °· You have a fever. °GET HELP RIGHT AWAY IF:  °· Your cut area gets red, painful, or puffy (swollen). °· You see a yellowish-white fluid (pus) coming from the cut. °  °This information is not intended to replace advice given to you by your health care provider. Make sure you discuss any questions you have with your health care provider. °  °Document Released: 07/03/2007 Document Revised: 02/04/2014 Document Reviewed: 08/27/2012 °Elsevier Interactive Patient Education ©2016 [employer] Inc. ° °

## 2015-05-05 NOTE — ED Notes (Signed)
Pt reports she tripped and fell and hit counter top this am around 0700... Has a 2 cm LAC to her forehead/right eyebrow... Bleeding controlled... Denies LOC... A&O x4... No acute distress.

## 2015-05-05 NOTE — ED Provider Notes (Signed)
CSN: [id number]     Arrival date & time 05/05/15  1800 History   First MD Initiated Contact with Patient 05/05/15 1939     Chief Complaint  Patient presents with  . Facial Laceration   (Consider location/radiation/quality/duration/timing/severity/associated sxs/prior Treatment) HPI About 7 am this morning. Pt slipped in the kitchen striking edge of counter. Laceration to right eyebrow. No LOC> did not realize how deep wound was until friend saw it this afternoon. No bleeding.  Past Medical History  Diagnosis Date  . Diabetes mellitus     Insulin pump  . Hepatitis C   . Depression   . GERD (gastroesophageal reflux disease)   . OCD (obsessive compulsive disorder)   . Iron deficiency anemia    History reviewed. No pertinent past surgical history. Family History  Problem Relation Age of Onset  . Bipolar disorder Mother   . Schizophrenia Sister    Social History  Substance Use Topics  . Smoking status: Former Games developer  . Smokeless tobacco: Never Used  . Alcohol Use: 0.0 oz/week    0 Standard drinks or equivalent per week     Comment: occasional   OB History    No data available     Review of Systems Right eyebrow laceration Allergies  Review of patient's allergies indicates no known allergies.  Home Medications   Prior to Admission medications   Medication Sig Start Date End Date Taking? Authorizing Provider  citalopram (CELEXA) 10 MG tablet Take 10 mg by mouth daily.   Yes Historical Provider, MD  insulin glargine (LANTUS) 100 UNIT/ML injection Inject 45 Units into the skin at bedtime. 02/22/12  Yes Richarda Overlie, MD  insulin glulisine (APIDRA) 100 UNIT/ML injection Use as directed 03/31/12  Yes Richarda Overlie, MD  Norgestimate-Ethinyl Estradiol Triphasic (ORTHO TRI-CYCLEN, 28,) 0.18/0.215/0.25 MG-35 MCG tablet Take 1 tablet by mouth daily.   Yes Historical Provider, MD  Elbasvir-Grazoprevir (ZEPATIER) 50-100 MG TABS Take 1 tablet by mouth daily. 08/10/14   Gardiner Barefoot, MD    Meds Ordered and Administered this Visit  Medications - No data to display  BP 158/99 mmHg  Pulse 81  Temp(Src) 98.1 F (36.7 C) (Oral)  SpO2 98% No data found.   Physical Exam NURSES NOTES AND VITAL SIGNS REVIEWED. CONSTITUTIONAL: Well developed, well nourished, no acute distress HEENT: normocephalic, right eyebrow 2 cm vertical laceration. Minimal gapping. No active bleeding. Tender to palpation.  EYES: Conjunctiva normal, EOM-I NECK:normal ROM, supple, no adenopathy PULMONARY:No respiratory distress, normal effort MUSCULOSKELETAL: Normal ROM of all extremities,  SKIN: warm and dry without rash PSYCHIATRIC: Mood and affect, behavior are normal  ED Course  .Marland KitchenLaceration Repair Date/Time: 05/05/2015 8:05 PM Performed by: Tharon Aquas Authorized by: Charm Rings Consent: Verbal consent obtained. Risks and benefits: risks, benefits and alternatives were discussed Consent given by: patient Patient identity confirmed: verbally with patient Time out: Immediately prior to procedure a "time out" was called to verify the correct patient, procedure, equipment, support staff and site/side marked as required. Body area: head/neck Location details: right eyebrow Laceration length: 2 cm Foreign bodies: no foreign bodies Tendon involvement: none Nerve involvement: none Vascular damage: no Anesthesia: local infiltration Local anesthetic: lidocaine 1% with epinephrine Anesthetic total: 3 ml Patient sedated: no Preparation: Patient was prepped and draped in the usual sterile fashion. Irrigation solution: saline Irrigation method: tap Amount of cleaning: standard Debridement: none Skin closure: 5-0 nylon Number of sutures: 5 Technique: simple Approximation: close Approximation difficulty: simple Dressing: antibiotic ointment Patient tolerance:  Patient tolerated the procedure well with no immediate complications   (including critical care time)  Labs Review Labs Reviewed  - No data to display  Imaging Review No results found.   Visual Acuity Review  Right Eye Distance:   Left Eye Distance:   Bilateral Distance:    Right Eye Near:   Left Eye Near:    Bilateral Near:       Sutures out i 5 days antibx ointment norco for pain.   MDM   1. Laceration of right eyebrow, initial encounter     Patient is reassured that there are no issues that require transfer to higher level of care at this time or additional tests. Patient is advised to continue home symptomatic treatment. Patient is advised that if there are new or worsening symptoms to attend the emergency department, contact primary care provider, or return to UC. Instructions of care provided discharged home in stable condition.    THIS NOTE WAS GENERATED USING A VOICE RECOGNITION SOFTWARE PROGRAM. ALL REASONABLE EFFORTS  WERE MADE TO PROOFREAD THIS DOCUMENT FOR ACCURACY.  I have verbally reviewed the discharge instructions with the patient. A printed AVS was given to the patient.  All questions were answered prior to discharge.      Tharon AquasFrank C Christoffer Currier, PA 05/05/15 2048

## 2015-05-10 ENCOUNTER — Encounter (HOSPITAL_COMMUNITY): Payer: Self-pay | Admitting: Emergency Medicine

## 2015-05-10 ENCOUNTER — Ambulatory Visit (HOSPITAL_COMMUNITY)
Admission: EM | Admit: 2015-05-10 | Discharge: 2015-05-10 | Disposition: A | Discharge status: Home / Self Care | Payer: Self-pay | Attending: Family Medicine | Admitting: Family Medicine

## 2015-05-10 DIAGNOSIS — Z4802 Encounter for removal of sutures: Secondary | ICD-10-CM

## 2015-05-10 NOTE — ED Notes (Signed)
[facility] in [facility] facial laceration [facility] and removed. The patient had stitches placed ion her right eyebrow on 05/05/2015 secondary to a fall.

## 2015-05-10 NOTE — ED Provider Notes (Signed)
CSN: [id number]     Arrival date & time 05/10/15  1754 History   First MD Initiated Contact with Patient 05/10/15 1912     Chief Complaint  Patient presents with  . Suture / Staple Removal   (Consider location/radiation/quality/duration/timing/severity/associated sxs/prior Treatment) HPI Suture removal. No complaints.  Past Medical History  Diagnosis Date  . Diabetes mellitus     Insulin pump  . Hepatitis C   . Depression   . GERD (gastroesophageal reflux disease)   . OCD (obsessive compulsive disorder)   . Iron deficiency anemia    History reviewed. No pertinent past surgical history. Family History  Problem Relation Age of Onset  . Bipolar disorder Mother   . Schizophrenia Sister    Social History  Substance Use Topics  . Smoking status: Former Games developermoker  . Smokeless tobacco: Never Used  . Alcohol Use: 0.0 oz/week    0 Standard drinks or equivalent per week     Comment: occasional   OB History    No data available     Review of Systems Suture removal Allergies  Review of patient's allergies indicates no known allergies.  Home Medications   Prior to Admission medications   Medication Sig Start Date End Date Taking? Authorizing Provider  citalopram (CELEXA) 10 MG tablet Take 10 mg by mouth daily.   Yes Historical Provider, MD  Elbasvir-Grazoprevir (ZEPATIER) 50-100 MG TABS Take 1 tablet by mouth daily. 08/10/14  Yes Gardiner Barefootobert W Comer, MD  HYDROcodone-acetaminophen (NORCO/VICODIN) 5-325 MG tablet Take 1 tablet by mouth every 4 (four) hours as needed. 05/05/15  Yes Tharon AquasFrank C Patrick, PA  insulin glargine (LANTUS) 100 UNIT/ML injection Inject 45 Units into the skin at bedtime. 02/22/12  Yes Richarda OverlieNayana Abrol, MD  insulin glulisine (APIDRA) 100 UNIT/ML injection Use as directed 03/31/12  Yes Richarda OverlieNayana Abrol, MD  Norgestimate-Ethinyl Estradiol Triphasic (ORTHO TRI-CYCLEN, 28,) 0.18/0.215/0.25 MG-35 MCG tablet Take 1 tablet by mouth daily.   Yes Historical Provider, MD   Meds Ordered and  Administered this Visit  Medications - No data to display  BP 139/94 mmHg  Pulse 84  Temp(Src) 97.7 F (36.5 C) (Oral)  Resp 16  SpO2 100% No data found.   Physical Exam NURSES NOTES AND VITAL SIGNS REVIEWED. CONSTITUTIONAL: Well developed, well nourished, no acute distress HEENT: normocephalic, atraumatic Right eyebrow, healing wound, no redness, not tender.  EYES: Conjunctiva normal NECK:normal ROM, supple, no adenopathy PULMONARY:No respiratory distress, normal effort MUSCULOSKELETAL: Normal ROM of all extremities,  SKIN: warm and dry without rash PSYCHIATRIC: Mood and affect, behavior are normal  ED Course  .Suture Removal Date/Time: 05/10/2015 7:30 PM Performed by: Tharon AquasPATRICK, FRANK C Authorized by: Bradd CanaryKINDL, JAMES D Consent: Verbal consent obtained. Consent given by: patient Patient identity confirmed: verbally with patient Body area: head/neck Location details: right eyebrow Wound Appearance: clean Sutures Removed: 5 Facility: sutures placed in this facility Patient tolerance: Patient tolerated the procedure well with no immediate complications   (including critical care time)  Labs Review Labs Reviewed - No data to display  Imaging Review No results found.   Visual Acuity Review  Right Eye Distance:   Left Eye Distance:   Bilateral Distance:    Right Eye Near:   Left Eye Near:    Bilateral Near:      Sutures removed without difficulty   MDM   1. Visit for suture removal     Patient is reassured that there are no issues that require transfer to higher level of care at this  time or additional tests. Patient is advised to continue home symptomatic treatment. Patient is advised that if there are new or worsening symptoms to attend the emergency department, contact primary care provider, or return to UC. Instructions of care provided discharged home in stable condition.    THIS NOTE WAS GENERATED USING A VOICE RECOGNITION SOFTWARE PROGRAM. ALL  REASONABLE EFFORTS  WERE MADE TO PROOFREAD THIS DOCUMENT FOR ACCURACY.  I have verbally reviewed the discharge instructions with the patient. A printed AVS was given to the patient.  All questions were answered prior to discharge.      Tharon Aquas, PA 05/10/15 1930  Tharon Aquas, PA 05/10/15 1931

## 2015-05-10 NOTE — Discharge Instructions (Signed)

## 2015-06-08 ENCOUNTER — Other Ambulatory Visit: Payer: Self-pay | Admitting: Pharmacist Clinician (PhC)/ Clinical Pharmacy Specialist

## 2015-06-08 ENCOUNTER — Telehealth: Payer: Self-pay | Admitting: Pharmacist Clinician (PhC)/ Clinical Pharmacy Specialist

## 2015-06-08 NOTE — Telephone Encounter (Signed)
[facility]'s SVR12 is neg. Scheduled her to see Dr. Luciana Axeomer in July as the last visit. She had some question about transmitting hep C sexually with someone she is dating that she said was hep C neg. Told her that if that is true then there is no chance of transmission. She is not using drugs any further.

## 2015-08-09 ENCOUNTER — Ambulatory Visit (INDEPENDENT_AMBULATORY_CARE_PROVIDER_SITE_OTHER): Payer: Self-pay | Admitting: Internal Medicine

## 2015-08-09 ENCOUNTER — Encounter: Payer: Self-pay | Admitting: Internal Medicine

## 2015-08-09 VITALS — BP 136/83 | HR 92 | Temp 98.5°F | Wt 143.0 lb

## 2015-08-09 DIAGNOSIS — B182 Chronic viral hepatitis C: Secondary | ICD-10-CM

## 2015-08-09 DIAGNOSIS — K7400 Hepatic fibrosis, unspecified: Secondary | ICD-10-CM

## 2015-08-09 NOTE — Assessment & Plan Note (Signed)
Now considered cured.  

## 2015-08-09 NOTE — Progress Notes (Signed)
   Subjective:    Patient ID: Anna Dennis, female    DOB: 03/29/1990, 25 y.o.   MRN: [id number]  HPI Here for follow up of HCV.   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 Started on Zepatier via [facility] and completed treatment with SVR 12 undetectable, confirming cure. Completed hepatitis A and B series.     Review of Systems  Constitutional: Negative for fatigue.  Gastrointestinal: Negative for nausea and diarrhea.  Skin: Negative for rash.  Neurological: Negative for dizziness and headaches.       Objective:   Physical Exam  Constitutional: She appears well-developed and well-nourished. No distress.  HENT:  Mouth/Throat: No oropharyngeal exudate.  Eyes: No scleral icterus.  Cardiovascular: Normal rate, regular rhythm and normal heart sounds.   No murmur heard. Pulmonary/Chest: Effort normal and breath sounds normal. No respiratory distress.  Skin: No rash noted.          Assessment & Plan:

## 2015-08-09 NOTE — Assessment & Plan Note (Signed)
Minimal.  No indication for HCC screening.  No follow up needed.

## 2015-09-04 ENCOUNTER — Observation Stay (HOSPITAL_COMMUNITY)
Admission: EM | Admit: 2015-09-04 | Discharge: 2015-09-05 | Disposition: A | Discharge status: Home / Self Care | Payer: Self-pay | Attending: Internal Medicine | Admitting: Internal Medicine

## 2015-09-04 ENCOUNTER — Encounter (HOSPITAL_COMMUNITY): Payer: Self-pay | Admitting: Emergency Medicine

## 2015-09-04 DIAGNOSIS — R55 Syncope and collapse: Secondary | ICD-10-CM | POA: Diagnosis present

## 2015-09-04 DIAGNOSIS — E870 Hyperosmolality and hypernatremia: Secondary | ICD-10-CM | POA: Diagnosis present

## 2015-09-04 DIAGNOSIS — Z87891 Personal history of nicotine dependence: Secondary | ICD-10-CM | POA: Insufficient documentation

## 2015-09-04 DIAGNOSIS — E1065 Type 1 diabetes mellitus with hyperglycemia: Secondary | ICD-10-CM | POA: Diagnosis present

## 2015-09-04 DIAGNOSIS — E860 Dehydration: Secondary | ICD-10-CM | POA: Diagnosis present

## 2015-09-04 DIAGNOSIS — D72829 Elevated white blood cell count, unspecified: Secondary | ICD-10-CM | POA: Diagnosis present

## 2015-09-04 DIAGNOSIS — F429 Obsessive-compulsive disorder, unspecified: Secondary | ICD-10-CM | POA: Insufficient documentation

## 2015-09-04 DIAGNOSIS — E1069 Type 1 diabetes mellitus with other specified complication: Secondary | ICD-10-CM

## 2015-09-04 DIAGNOSIS — E1010 Type 1 diabetes mellitus with ketoacidosis without coma: Principal | ICD-10-CM | POA: Insufficient documentation

## 2015-09-04 DIAGNOSIS — F32A Depression, unspecified: Secondary | ICD-10-CM | POA: Diagnosis present

## 2015-09-04 DIAGNOSIS — E875 Hyperkalemia: Secondary | ICD-10-CM | POA: Diagnosis present

## 2015-09-04 DIAGNOSIS — N179 Acute kidney failure, unspecified: Secondary | ICD-10-CM | POA: Diagnosis present

## 2015-09-04 DIAGNOSIS — F329 Major depressive disorder, single episode, unspecified: Secondary | ICD-10-CM | POA: Insufficient documentation

## 2015-09-04 DIAGNOSIS — Z794 Long term (current) use of insulin: Secondary | ICD-10-CM | POA: Insufficient documentation

## 2015-09-04 DIAGNOSIS — F1310 Sedative, hypnotic or anxiolytic abuse, uncomplicated: Secondary | ICD-10-CM | POA: Insufficient documentation

## 2015-09-04 DIAGNOSIS — F1210 Cannabis abuse, uncomplicated: Secondary | ICD-10-CM | POA: Insufficient documentation

## 2015-09-04 DIAGNOSIS — B182 Chronic viral hepatitis C: Secondary | ICD-10-CM | POA: Diagnosis present

## 2015-09-04 DIAGNOSIS — F1910 Other psychoactive substance abuse, uncomplicated: Secondary | ICD-10-CM | POA: Insufficient documentation

## 2015-09-04 DIAGNOSIS — F1110 Opioid abuse, uncomplicated: Secondary | ICD-10-CM | POA: Diagnosis present

## 2015-09-04 HISTORY — DX: Other psychoactive substance use, unspecified, uncomplicated: F19.90

## 2015-09-04 HISTORY — DX: Opioid abuse, uncomplicated: F11.10

## 2015-09-04 LAB — CBC
HCT: 40.1 % (ref 36.0–46.0)
Hemoglobin: 12.7 g/dL (ref 12.0–15.0)
MCH: 28.3 pg (ref 26.0–34.0)
MCHC: 31.7 g/dL (ref 30.0–36.0)
MCV: 89.3 fL (ref 78.0–100.0)
PLATELETS: 419 10*3/uL — AB (ref 150–400)
RBC: 4.49 MIL/uL (ref 3.87–5.11)
RDW: 14 % (ref 11.5–15.5)
WBC: 23.5 10*3/uL — AB (ref 4.0–10.5)

## 2015-09-04 LAB — BASIC METABOLIC PANEL
Anion gap: 12 (ref 5–15)
BUN: 22 mg/dL — AB (ref 6–20)
CALCIUM: 8.5 mg/dL — AB (ref 8.9–10.3)
CHLORIDE: 100 mmol/L — AB (ref 101–111)
CO2: 20 mmol/L — AB (ref 22–32)
CREATININE: 1.54 mg/dL — AB (ref 0.44–1.00)
GFR calc Af Amer: 53 mL/min — ABNORMAL LOW (ref >60)
GFR calc non Af Amer: 46 mL/min — ABNORMAL LOW (ref >60)
Glucose, Bld: 852 mg/dL (ref 65–99)
Potassium: 5.2 mmol/L — ABNORMAL HIGH (ref 3.5–5.1)
SODIUM: 132 mmol/L — AB (ref 135–145)

## 2015-09-04 NOTE — ED Triage Notes (Signed)
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 Pts family could not get a hold of pt and found her passed out. When EMS arrived pt was AxO and talking. Pt states she sees her PCP for her diabetes and has been having trouble managing her blood sugar. She is only on a long acting insulin. EMS CBG read HIGH. 500 CC bolus given en route.

## 2015-09-04 NOTE — ED Notes (Signed)
Pt states she had 20 units of 70/30 around 7:30 tonight.

## 2015-09-04 NOTE — ED Notes (Signed)
Bed: NW29WA15 Expected date:  Expected time:  Means of arrival:  Comments: [age] F hyperglycemia

## 2015-09-05 ENCOUNTER — Encounter (HOSPITAL_COMMUNITY): Payer: Self-pay | Admitting: Internal Medicine

## 2015-09-05 DIAGNOSIS — D72829 Elevated white blood cell count, unspecified: Secondary | ICD-10-CM

## 2015-09-05 DIAGNOSIS — R55 Syncope and collapse: Secondary | ICD-10-CM | POA: Diagnosis present

## 2015-09-05 DIAGNOSIS — F1110 Opioid abuse, uncomplicated: Secondary | ICD-10-CM | POA: Diagnosis present

## 2015-09-05 DIAGNOSIS — F329 Major depressive disorder, single episode, unspecified: Secondary | ICD-10-CM

## 2015-09-05 DIAGNOSIS — E1065 Type 1 diabetes mellitus with hyperglycemia: Secondary | ICD-10-CM

## 2015-09-05 DIAGNOSIS — E875 Hyperkalemia: Secondary | ICD-10-CM | POA: Diagnosis present

## 2015-09-05 DIAGNOSIS — N179 Acute kidney failure, unspecified: Secondary | ICD-10-CM | POA: Diagnosis present

## 2015-09-05 DIAGNOSIS — E1069 Type 1 diabetes mellitus with other specified complication: Secondary | ICD-10-CM

## 2015-09-05 LAB — LIPASE, BLOOD: Lipase: 27 U/L (ref 11–51)

## 2015-09-05 LAB — DIFFERENTIAL
BASOS PCT: 0 %
Basophils Absolute: 0 10*3/uL (ref 0.0–0.1)
Eosinophils Absolute: 0 10*3/uL (ref 0.0–0.7)
Eosinophils Relative: 0 %
LYMPHS ABS: 1.4 10*3/uL (ref 0.7–4.0)
LYMPHS PCT: 6 %
MONO ABS: 1.4 10*3/uL — AB (ref 0.1–1.0)
MONOS PCT: 6 %
NEUTROS ABS: 20.5 10*3/uL — AB (ref 1.7–7.7)
Neutrophils Relative %: 88 %

## 2015-09-05 LAB — BASIC METABOLIC PANEL
Anion gap: 7 (ref 5–15)
Anion gap: 5 (ref 5–15)
BUN: 17 mg/dL (ref 6–20)
BUN: 16 mg/dL (ref 6–20)
CALCIUM: 8.2 mg/dL — AB (ref 8.9–10.3)
CALCIUM: 8.1 mg/dL — AB (ref 8.9–10.3)
CHLORIDE: 109 mmol/L (ref 101–111)
CO2: 23 mmol/L (ref 22–32)
CO2: 23 mmol/L (ref 22–32)
CREATININE: 0.86 mg/dL (ref 0.44–1.00)
CREATININE: 0.68 mg/dL (ref 0.44–1.00)
Chloride: 109 mmol/L (ref 101–111)
GFR calc Af Amer: >60 mL/min (ref >60)
GFR calc non Af Amer: >60 mL/min (ref >60)
Glucose, Bld: 149 mg/dL — ABNORMAL HIGH (ref 65–99)
Glucose, Bld: 164 mg/dL — ABNORMAL HIGH (ref 65–99)
Potassium: 3.8 mmol/L (ref 3.5–5.1)
Potassium: 3.8 mmol/L (ref 3.5–5.1)
SODIUM: 139 mmol/L (ref 135–145)
SODIUM: 137 mmol/L (ref 135–145)

## 2015-09-05 LAB — HEPATIC FUNCTION PANEL
ALT: 12 U/L — AB (ref 14–54)
AST: 20 U/L (ref 15–41)
Albumin: 3.9 g/dL (ref 3.5–5.0)
Alkaline Phosphatase: 84 U/L (ref 38–126)
Bilirubin, Direct: <0.1 mg/dL — ABNORMAL LOW (ref 0.1–0.5)
TOTAL PROTEIN: 7.7 g/dL (ref 6.5–8.1)
Total Bilirubin: 0.5 mg/dL (ref 0.3–1.2)

## 2015-09-05 LAB — RAPID URINE DRUG SCREEN, HOSP PERFORMED
Amphetamines: NOT DETECTED
Barbiturates: POSITIVE — AB
Benzodiazepines: NOT DETECTED
COCAINE: NOT DETECTED
OPIATES: POSITIVE — AB
TETRAHYDROCANNABINOL: POSITIVE — AB

## 2015-09-05 LAB — URINALYSIS, ROUTINE W REFLEX MICROSCOPIC
Bilirubin Urine: NEGATIVE
Glucose, UA: >1000 mg/dL — AB
KETONES UR: NEGATIVE mg/dL
LEUKOCYTES UA: NEGATIVE
NITRITE: NEGATIVE
PH: 6.5 (ref 5.0–8.0)
Protein, ur: 30 mg/dL — AB
SPECIFIC GRAVITY, URINE: 1.019 (ref 1.005–1.030)

## 2015-09-05 LAB — GLUCOSE, CAPILLARY
GLUCOSE-CAPILLARY: 209 mg/dL — AB (ref 65–99)
GLUCOSE-CAPILLARY: 112 mg/dL — AB (ref 65–99)
GLUCOSE-CAPILLARY: 130 mg/dL — AB (ref 65–99)
GLUCOSE-CAPILLARY: 142 mg/dL — AB (ref 65–99)
GLUCOSE-CAPILLARY: 245 mg/dL — AB (ref 65–99)
Glucose-Capillary: 424 mg/dL — ABNORMAL HIGH (ref 65–99)
Glucose-Capillary: 302 mg/dL — ABNORMAL HIGH (ref 65–99)
Glucose-Capillary: 52 mg/dL — ABNORMAL LOW (ref 65–99)
Glucose-Capillary: 182 mg/dL — ABNORMAL HIGH (ref 65–99)
Glucose-Capillary: 238 mg/dL — ABNORMAL HIGH (ref 65–99)

## 2015-09-05 LAB — CBG MONITORING, ED
Glucose-Capillary: >600 mg/dL (ref 65–99)
Glucose-Capillary: 465 mg/dL — ABNORMAL HIGH (ref 65–99)

## 2015-09-05 LAB — CBC
HCT: 31.8 % — ABNORMAL LOW (ref 36.0–46.0)
Hemoglobin: 10.4 g/dL — ABNORMAL LOW (ref 12.0–15.0)
MCH: 28.6 pg (ref 26.0–34.0)
MCHC: 32.7 g/dL (ref 30.0–36.0)
MCV: 87.4 fL (ref 78.0–100.0)
PLATELETS: 348 10*3/uL (ref 150–400)
RBC: 3.64 MIL/uL — ABNORMAL LOW (ref 3.87–5.11)
RDW: 14.1 % (ref 11.5–15.5)
WBC: 13.5 10*3/uL — ABNORMAL HIGH (ref 4.0–10.5)

## 2015-09-05 LAB — SODIUM, URINE, RANDOM: Sodium, Ur: 43 mmol/L

## 2015-09-05 LAB — URINE MICROSCOPIC-ADD ON

## 2015-09-05 LAB — BLOOD GAS, VENOUS
Acid-base deficit: 8.5 mmol/L — ABNORMAL HIGH (ref 0.0–2.0)
Bicarbonate: 19.2 mEq/L — ABNORMAL LOW (ref 20.0–24.0)
FIO2: 0.21
O2 Saturation: 97.6 %
PATIENT TEMPERATURE: 98.6
PH VEN: 7.211 — AB (ref 7.250–7.300)
TCO2: 17.9 mmol/L (ref 0–100)
pCO2, Ven: 49.7 mmHg (ref 45.0–50.0)
pO2, Ven: 135 mmHg — ABNORMAL HIGH (ref 31.0–45.0)

## 2015-09-05 LAB — ETHANOL: Alcohol, Ethyl (B): <5 mg/dL (ref <5)

## 2015-09-05 LAB — CREATININE, URINE, RANDOM: Creatinine, Urine: 17.41 mg/dL

## 2015-09-05 LAB — MRSA PCR SCREENING: MRSA BY PCR: NEGATIVE

## 2015-09-05 MED ORDER — DEXTROSE 50 % IV SOLN
INTRAVENOUS | Status: AC
  Filled 2015-09-05: qty 50

## 2015-09-05 MED ORDER — DEXTROSE 5 % IV SOLN
INTRAVENOUS | Status: DC
  Administered 2015-09-05: 09:00:00 via INTRAVENOUS

## 2015-09-05 MED ORDER — SODIUM CHLORIDE 0.9 % IV SOLN
INTRAVENOUS | Status: DC
  Administered 2015-09-05: 5.4 [IU]/h via INTRAVENOUS
  Administered 2015-09-05: 5.5 [IU]/h via INTRAVENOUS
  Administered 2015-09-05: 7.1 [IU]/h via INTRAVENOUS
  Filled 2015-09-05: qty 2.5

## 2015-09-05 MED ORDER — LISDEXAMFETAMINE DIMESYLATE 20 MG PO CAPS
20.0000 mg | ORAL_CAPSULE | Freq: Every day | ORAL | Status: DC
  Administered 2015-09-05: 20 mg via ORAL
  Filled 2015-09-05: qty 1

## 2015-09-05 MED ORDER — ENOXAPARIN SODIUM 40 MG/0.4ML ~~LOC~~ SOLN
40.0000 mg | SUBCUTANEOUS | Status: DC
  Filled 2015-09-05: qty 0.4

## 2015-09-05 MED ORDER — INSULIN NPH ISOPHANE & REGULAR (70-30) 100 UNIT/ML ~~LOC~~ SUSP: 20.0000 [IU] | Freq: Every day | SUBCUTANEOUS | 0 refills | Status: DC

## 2015-09-05 MED ORDER — DEXTROSE 50 % IV SOLN
25.0000 mL | Freq: Once | INTRAVENOUS | Status: AC
  Administered 2015-09-05: 25 mL via INTRAVENOUS

## 2015-09-05 MED ORDER — CITALOPRAM HYDROBROMIDE 20 MG PO TABS
20.0000 mg | ORAL_TABLET | Freq: Every day | ORAL | Status: DC
  Administered 2015-09-05: 20 mg via ORAL
  Filled 2015-09-05: qty 1

## 2015-09-05 MED ORDER — INSULIN ASPART 100 UNIT/ML IV SOLN
10.0000 [IU] | Freq: Once | INTRAVENOUS | Status: AC
  Administered 2015-09-05: 10 [IU] via INTRAVENOUS
  Filled 2015-09-05: qty 0.1

## 2015-09-05 MED ORDER — SODIUM CHLORIDE 0.9% FLUSH
3.0000 mL | Freq: Two times a day (BID) | INTRAVENOUS | Status: DC
  Administered 2015-09-05: 3 mL via INTRAVENOUS

## 2015-09-05 MED ORDER — INSULIN DETEMIR 100 UNIT/ML ~~LOC~~ SOLN: 32.0000 [IU] | Freq: Every day | SUBCUTANEOUS | 1 refills | Status: DC

## 2015-09-05 MED ORDER — INSULIN LISPRO 100 UNIT/ML ~~LOC~~ SOLN: SUBCUTANEOUS | 1 refills | Status: DC

## 2015-09-05 MED ORDER — INSULIN ASPART 100 UNIT/ML ~~LOC~~ SOLN
0.0000 [IU] | Freq: Three times a day (TID) | SUBCUTANEOUS | Status: DC
  Administered 2015-09-05: 3 [IU] via SUBCUTANEOUS

## 2015-09-05 MED ORDER — SODIUM CHLORIDE 0.9 % IV BOLUS (SEPSIS)
2000.0000 mL | Freq: Once | INTRAVENOUS | Status: AC
Start: 2015-09-05 — End: 2015-09-05
  Administered 2015-09-05: 2000 mL via INTRAVENOUS

## 2015-09-05 MED ORDER — SODIUM CHLORIDE 0.9 % IV SOLN
1000.0000 mL | INTRAVENOUS | Status: DC
  Administered 2015-09-05: 1000 mL via INTRAVENOUS

## 2015-09-05 MED ORDER — INSULIN GLARGINE 100 UNIT/ML ~~LOC~~ SOLN
5.0000 [IU] | Freq: Once | SUBCUTANEOUS | Status: DC
  Filled 2015-09-05: qty 0.05

## 2015-09-05 MED ORDER — SODIUM CHLORIDE 0.9 % IV SOLN: INTRAVENOUS | Status: AC

## 2015-09-05 MED ORDER — ONDANSETRON HCL 4 MG/2ML IJ SOLN: 4.0000 mg | Freq: Four times a day (QID) | INTRAMUSCULAR | Status: DC | PRN

## 2015-09-05 MED ORDER — SODIUM CHLORIDE 0.9 % IV SOLN
1000.0000 mL | Freq: Once | INTRAVENOUS | Status: AC
  Administered 2015-09-05: 1000 mL via INTRAVENOUS

## 2015-09-05 MED ORDER — INSULIN GLARGINE 100 UNIT/ML ~~LOC~~ SOLN
20.0000 [IU] | Freq: Every day | SUBCUTANEOUS | Status: DC
  Administered 2015-09-05: 20 [IU] via SUBCUTANEOUS
  Filled 2015-09-05: qty 0.2

## 2015-09-05 MED ORDER — ONDANSETRON HCL 4 MG PO TABS: 4.0000 mg | ORAL_TABLET | Freq: Four times a day (QID) | ORAL | Status: DC | PRN

## 2015-09-05 MED ORDER — DEXTROSE-NACL 5-0.45 % IV SOLN: INTRAVENOUS | Status: DC

## 2015-09-05 NOTE — Progress Notes (Addendum)
Inpatient Diabetes Program Recommendations  AACE/ADA: New Consensus Statement on Inpatient Glycemic Control (2015)  Target Ranges:  Prepandial:   less than 140 mg/dL      Peak postprandial:   less than 180 mg/dL (1-2 hours)      Critically ill patients:  140 - 180 mg/dL   Lab Results  Component Value Date   GLUCAP 142 (H) 09/05/2015   HGBA1C 8.2 (H) 02/19/2012    Review of Glycemic Control  Diabetes history: DM (1) Outpatient Diabetes medications: novolin 70/30 24 units in the am and 20 units in the pm Current orders for Inpatient glycemic control: IV insulin drip per glucostabilizer  Inpatient Diabetes Program Recommendations:     Referral received for uncontrolled dm type 1. Have spoken extensively with patient regarding her diabetes control and use of insulin. Patient states she use to go to Dr Fransico MichaelBrennan who put her on an insulin pump. Patient was well educated and continues to be very aware of what her insulin needs are.While she was on the insulin pump, she states her basal needs totalled 32-34 units per 24 hrs, her bolus correction dose was 1 unit for every 25 mg/dL over 841150 mg/dL. Her meal coverage is 1 unit for every 5 grams carbohydrate. Patient states she was prescribed 70/30 insulin rather than levemir [facility] there was not comfortable with using basal/bolus.  Patient ordered 5 units lantus prior to discontinuation of IV insulin. According to patient's weight of 145 lbs/ 64 kg. At the least, patient will need 25 units lantus or levemir and 25 units basal novolo/bolus. Her correction dose calculates to be 1 unit per 25 mg/dL over goal which is 324150 mg/dL  (exactly the doses she use to be taking). Her meal coverage at least 6 units  tidwc (although she will most likely need more)  Recommend lantus 25 units 2 hrs prior to discontinuation of IV insulin drip, sensitive correction tidwc to start at the time the drip is discontinued  simultaneously. (Patient has ordered her breakfast which she states she would normally give herself 9 units for 45 grams carbohydrate.) Calculations done based on weight, the doses she has quoted  based basal/bolus are per Dr Fransico MichaelBrennan (prior to her losing her insurance and seeing [facility]) Paged Dr Elisabeth Pigeonevine with these recommendations who requested I enter the orders per above.  Thank you Lenor CoffinAnn Ledger Heindl, RN, MSN, CDE  Diabetes Inpatient Program Office: [phone] Pager: [phone] 8:00 am to 5:00 pm

## 2015-09-05 NOTE — ED Provider Notes (Signed)
[facility]-EMERGENCY DEPT Provider Note   CSN: [id number] Arrival date & time: 09/04/15  2243  By signing my name below, I, Phillis Haggis, attest that this documentation has been prepared under the direction and in the presence of Dione Booze, MD. Electronically Signed: Phillis Haggis, ED Scribe. 09/05/15. 12:12 AM.  First Provider Contact:  First MD Initiated Contact with Patient 09/04/15 2358     History   Chief Complaint Chief Complaint  Patient presents with  . Hyperglycemia   The history is provided by the patient. No language interpreter was used.  HPI Comments: Anna Dennis is a 25 y.o. Female with a hx of Type I DM, Hepatitis C, liver fibrosis, and DKA brought in by EMS who presents to the Emergency Department complaining of syncope onset PTA. Pt states that her mother and sister called EMS because pt did heroin intravenously tonight and was found unresponsive. Pt was awake upon EMS arrival and not given Narcan. Pt is on long acting insulin only because her insulin pump broke. She states that she is not on the right medication and has not been able to see an endocrinologist due to insurance. Her CBG en route read high. She is reporting associated polydipsia and polyuria due to her elevated CBG. She was given 500 CC bolus en route. Pt states that she is attempting to detox from heroin and get into a methadone clinic. She denies use of other drugs. She denies nausea or vomiting.   Past Medical History:  Diagnosis Date  . Depression   . Diabetes mellitus    Insulin pump  . GERD (gastroesophageal reflux disease)   . Hepatitis C   . Iron deficiency anemia   . OCD (obsessive compulsive disorder)     Patient Active Problem List   Diagnosis Date Noted  . Liver fibrosis (HCC) 10/25/2014  . 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 Dehydration 02/20/2012  . Sinus tachycardia (HCC) 02/20/2012  . Leukocytosis 02/19/2012  . GERD (gastroesophageal reflux disease)  02/19/2012  . DKA (diabetic ketoacidoses) (HCC) 01/19/2012  . DEPRESSION 03/14/2009    History reviewed. No pertinent surgical history.  OB History    No data available       Home Medications    Prior to Admission medications   Medication Sig Start Date End Date Taking? Authorizing Provider  citalopram (CELEXA) 20 MG tablet Take 20 mg by mouth daily.   Yes Historical Provider, MD  insulin NPH-regular Human (NOVOLIN 70/30) (70-30) 100 UNIT/ML injection Inject 20-24 Units into the skin daily. 24 units in the morning and 20 units at night   Yes Historical Provider, MD  lisdexamfetamine (VYVANSE) 20 MG capsule Take 20 mg by mouth daily.   Yes Historical Provider, MD  insulin glargine (LANTUS) 100 UNIT/ML injection Inject 45 Units into the skin at bedtime. Patient not taking: Reported on 09/04/2015 02/22/12   Richarda Overlie, MD  insulin glulisine (APIDRA) 100 UNIT/ML injection Use as directed Patient not taking: Reported on 09/04/2015 03/31/12   Richarda Overlie, MD    Family History Family History  Problem Relation Age of Onset  . Bipolar disorder Mother   . Schizophrenia Sister     Social History Social History  Substance Use Topics  . Smoking status: Former Games developer  . Smokeless tobacco: Never Used  . Alcohol use 0.0 oz/week     Comment: occasional     Allergies   Review of patient's allergies indicates no known allergies.   Review of Systems Review of  Systems  Gastrointestinal: Negative for nausea and vomiting.  Endocrine: Positive for polydipsia and polyuria.  Neurological: Positive for syncope.  All other systems reviewed and are negative.    Physical Exam Updated Vital Signs BP 131/90 (BP Location: Left Arm)   Pulse 104   Temp 97.7 F (36.5 C) (Oral)   Resp 14   SpO2 94%   Physical Exam  Constitutional: She is oriented to person, place, and time. She appears well-developed and well-nourished.  HENT:  Head: Normocephalic and atraumatic.  Eyes: EOM are normal.  Pupils are equal, round, and reactive to light.  Neck: Normal range of motion. Neck supple. No JVD present.  Cardiovascular: Normal rate, regular rhythm and normal heart sounds.   No murmur heard. Pulmonary/Chest: Effort normal and breath sounds normal. She has no wheezes. She has no rales. She exhibits no tenderness.  Abdominal: Soft. She exhibits no distension and no mass. There is no tenderness.  Musculoskeletal: Normal range of motion. She exhibits no edema.  Lymphadenopathy:    She has no cervical adenopathy.  Neurological: She is alert and oriented to person, place, and time. No cranial nerve deficit. She exhibits normal muscle tone. Coordination normal.  Skin: Skin is warm and dry. No rash noted.  Psychiatric: She has a normal mood and affect. Her behavior is normal. Judgment and thought content normal.  Nursing note and vitals reviewed.    ED Treatments / Results  DIAGNOSTIC STUDIES: Oxygen Saturation is 94% on RA, adequate by my interpretation.    COORDINATION OF CARE: 12:08 AM-Discussed treatment plan which includes IV fluids, insulin, and labs with pt at bedside and pt agreed to plan.    Labs (all labs ordered are listed, but only abnormal results are displayed) Labs Reviewed  BASIC METABOLIC PANEL - Abnormal; Notable for the following:       Result Value   Sodium 132 (*)    Potassium 5.2 (*)    Chloride 100 (*)    CO2 20 (*)    Glucose, Bld 852 (*)    BUN 22 (*)    Creatinine, Ser 1.54 (*)    Calcium 8.5 (*)    GFR calc non Af Amer 46 (*)    GFR calc Af Amer 53 (*)    All other components within normal limits  CBC - Abnormal; Notable for the following:    WBC 23.5 (*)    Platelets 419 (*)    All other components within normal limits  URINALYSIS, ROUTINE W REFLEX MICROSCOPIC (NOT AT [facility]) - Abnormal; Notable for the following:    Glucose, UA >1000 (*)    Hgb urine dipstick MODERATE (*)    Protein, ur 30 (*)    All other components within normal limits    BLOOD GAS, VENOUS - Abnormal; Notable for the following:    pH, Ven 7.211 (*)    pO2, Ven 135.0 (*)    Bicarbonate 19.2 (*)    Acid-base deficit 8.5 (*)    All other components within normal limits  HEPATIC FUNCTION PANEL - Abnormal; Notable for the following:    ALT 12 (*)    Bilirubin, Direct <0.1 (*)    All other components within normal limits  URINE RAPID DRUG SCREEN, HOSP PERFORMED - Abnormal; Notable for the following:    Opiates POSITIVE (*)    Tetrahydrocannabinol POSITIVE (*)    Barbiturates POSITIVE (*)    All other components within normal limits  DIFFERENTIAL - Abnormal; Notable for the following:  Neutro Abs 20.5 (*)    Monocytes Absolute 1.4 (*)    All other components within normal limits  URINE MICROSCOPIC-ADD ON - Abnormal; Notable for the following:    Squamous Epithelial / LPF 0-5 (*)    Bacteria, UA RARE (*)    All other components within normal limits  HEMOGLOBIN A1C - Abnormal; Notable for the following:    Hgb A1c MFr Bld 13.7 (*)    All other components within normal limits  BASIC METABOLIC PANEL - Abnormal; Notable for the following:    Glucose, Bld 149 (*)    Calcium 8.2 (*)    All other components within normal limits  BASIC METABOLIC PANEL - Abnormal; Notable for the following:    Glucose, Bld 164 (*)    Calcium 8.1 (*)    All other components within normal limits  GLUCOSE, CAPILLARY - Abnormal; Notable for the following:    Glucose-Capillary 424 (*)    All other components within normal limits  GLUCOSE, CAPILLARY - Abnormal; Notable for the following:    Glucose-Capillary 302 (*)    All other components within normal limits  GLUCOSE, CAPILLARY - Abnormal; Notable for the following:    Glucose-Capillary 209 (*)    All other components within normal limits  GLUCOSE, CAPILLARY - Abnormal; Notable for the following:    Glucose-Capillary 52 (*)    All other components within normal limits  GLUCOSE, CAPILLARY - Abnormal; Notable for the  following:    Glucose-Capillary 112 (*)    All other components within normal limits  GLUCOSE, CAPILLARY - Abnormal; Notable for the following:    Glucose-Capillary 130 (*)    All other components within normal limits  GLUCOSE, CAPILLARY - Abnormal; Notable for the following:    Glucose-Capillary 142 (*)    All other components within normal limits  GLUCOSE, CAPILLARY - Abnormal; Notable for the following:    Glucose-Capillary 182 (*)    All other components within normal limits  CBC - Abnormal; Notable for the following:    WBC 13.5 (*)    RBC 3.64 (*)    Hemoglobin 10.4 (*)    HCT 31.8 (*)    All other components within normal limits  GLUCOSE, CAPILLARY - Abnormal; Notable for the following:    Glucose-Capillary 245 (*)    All other components within normal limits  GLUCOSE, CAPILLARY - Abnormal; Notable for the following:    Glucose-Capillary 238 (*)    All other components within normal limits  CBG MONITORING, ED - Abnormal; Notable for the following:    Glucose-Capillary >600 (*)    All other components within normal limits  CBG MONITORING, ED - Abnormal; Notable for the following:    Glucose-Capillary 465 (*)    All other components within normal limits  MRSA PCR SCREENING  LIPASE, BLOOD  ETHANOL  CREATININE, URINE, RANDOM  SODIUM, URINE, RANDOM  HIV ANTIBODY (ROUTINE TESTING)  CBG MONITORING, ED     Procedures Procedures (including critical care time) CRITICAL CARE Performed by: ZOXWR,UEAVW Total critical care time: 60 minutes Critical care time was exclusive of separately billable procedures and treating other patients. Critical care was necessary to treat or prevent imminent or life-threatening deterioration. Critical care was time spent personally by me on the following activities: development of treatment plan with patient and/or surrogate as well as nursing, discussions with consultants, evaluation of patient's response to treatment, examination of patient,  obtaining history from patient or surrogate, ordering and performing treatments and interventions, ordering  and review of laboratory studies, ordering and review of radiographic studies, pulse oximetry and re-evaluation of patient's condition.  Medications Ordered in ED Medications  0.9 %  sodium chloride infusion (0 mLs Intravenous Stopped 09/05/15 0212)  insulin aspart (novoLOG) injection 10 Units (10 Units Intravenous Given 09/05/15 0029)  0.9 %  sodium chloride infusion ( Intravenous Duplicate 09/05/15 0253)  sodium chloride 0.9 % bolus 2,000 mL (2,000 mLs Intravenous Transfusing/Transfer 09/05/15 0230)  dextrose 50 % solution 25 mL (25 mLs Intravenous Given 09/05/15 0743)   Initial Impression / Assessment and Plan / ED Course  I have reviewed the triage vital signs and the nursing notes.  Pertinent labs & imaging results that were available during my care of the patient were reviewed by me and considered in my medical decision making (see chart for details).  Clinical Course    Probable heroin overdose which has resolved spontaneously. Incidental finding of severe hyperglycemia. Laboratory evaluation shows increased creatinine over baseline and low CO2 compare with baseline. Although anion gap is normal, suspect early DKA. In his blood gas was obtained showing pH of 7.21 consistent with ketoacidosis. She started on treatment with IV normal saline and intravenous insulin. Old records reviewed showing prior hospitalizations for ketoacidosis. Case is discussed with Dr. Clyde LundborgNiu of [facility] who agrees to admit the patient.  Final Clinical Impressions(s) / ED Diagnoses   Final diagnoses:  Diabetic ketoacidosis without coma associated with type 1 diabetes mellitus (HCC)  Acute kidney injury (nontraumatic) (HCC)  Opioid abuse  I personally performed the services described in this documentation, which was scribed in my presence. The recorded information has been reviewed and is accurate.       New Prescriptions New Prescriptions   No medications on file     Dione Boozeavid Dotty Gonzalo, MD 09/06/15 [phone]

## 2015-09-05 NOTE — H&P (Addendum)
History and Physical    Anna Dennis RUE:[id number] DOB: 05/14/1990 DOA: 09/04/2015  Referring MD/NP/PA:   PCP: [facility]   Patient coming from:  The patient is coming from home.  At baseline, pt is independent for most of ADL.   Chief Complaint: syncope   HPI: Anna Dennis is a 25 y.o. female with medical history significant of type 1 diabetes, GERD, polysubstance abuse, IV drug user, OCD, HCV, depression, who presents with syncope.  Per report, pt's mother and sister called EMS because pt did heroin intravenously and passed out tonight. She was found unresponsive. Not sure for how long.  Pt was awake upon EMS arrival and not given Narcan. Per EMS, pt's blood sugar was significantly elevated. Pt states that she used to use of insulin pump which broke. She is currently taking mixed insulin 70/30 (24 units in the morning and 20 units in evening).She states that she has not been able to see an endocrinologist due to insurance. She is reporting associated polydipsia and polyuria due to her elevated CBG. Pt states that she is attempting to detox from heroin and get into a methadone clinic. Pt denies chest pain, shortness of breath, cough, fever, chills, nausea, vomiting, abdominal pain, diarrhea or unilateral weakness.  ED Course: pt was found to have blood sugar 852, anion gap 12, bicarbonate 20, WBC 23.5, lipase 27, temperature normal, tachycardia, no tachypnea, potassium 5.2, AKI with creatinine 1.54, BUN 22. UDS is positive for barbiturates, opiates and THC. Insulin gtt was started in ED. Patient is placed on stepdown bed for observation.  Review of Systems:   General: no fevers, chills, no changes in body weight, has fatigue HEENT: no blurry vision, hearing changes or sore throat Pulm: no dyspnea, coughing, wheezing CV: no chest pain, no palpitations Abd: no nausea, vomiting, abdominal pain, diarrhea, constipation GU: no dysuria, burning on urination, increased  urinary frequency, hematuria  Ext: no leg edema Neuro: no unilateral weakness, numbness, or tingling, no vision change or hearing loss Skin: no rash MSK: No muscle spasm, no deformity, no limitation of range of movement in spin Heme: No easy bruising.  Travel history: No recent long distant travel.  Allergy: No Known Allergies  Past Medical History:  Diagnosis Date  . Depression   . Diabetes mellitus    Insulin pump  . GERD (gastroesophageal reflux disease)   . Hepatitis C   . Heroin abuse   . Iron deficiency anemia   . IVDU (intravenous drug user)   . OCD (obsessive compulsive disorder)     History reviewed. No pertinent surgical history.  Social History:  reports that she has quit smoking. She has never used smokeless tobacco. She reports that she drinks alcohol. She reports that she does not use drugs.  Family History:  Family History  Problem Relation Age of Onset  . Bipolar disorder Mother   . Schizophrenia Sister      Prior to Admission medications   Medication Sig Start Date End Date Taking? Authorizing Provider  citalopram (CELEXA) 20 MG tablet Take 20 mg by mouth daily.   Yes Historical Provider, MD  insulin NPH-regular Human (NOVOLIN 70/30) (70-30) 100 UNIT/ML injection Inject 20-24 Units into the skin daily. 24 units in the morning and 20 units at night   Yes Historical Provider, MD  lisdexamfetamine (VYVANSE) 20 MG capsule Take 20 mg by mouth daily.   Yes Historical Provider, MD  insulin glargine (LANTUS) 100 UNIT/ML injection Inject 45 Units into the skin  at bedtime. Patient not taking: Reported on 09/04/2015 02/22/12   Richarda Overlie, MD  insulin glulisine (APIDRA) 100 UNIT/ML injection Use as directed Patient not taking: Reported on 09/04/2015 03/31/12   Richarda Overlie, MD    Physical Exam: Vitals:   09/05/15 0515 09/05/15 0516 09/05/15 0519 09/05/15 0529  BP: (!) 94/53 (!) 103/52 (!) 105/56   Pulse: 73 75 76   Resp: 10 10 17    Temp:    97.7 F (36.5 C)    TempSrc:    Oral  SpO2: 99% 99% 99%   Weight:      Height:       General: Not in acute distress HEENT:       Eyes: PERRL, EOMI, no scleral icterus.       ENT: No discharge from the ears and nose, no pharynx injection, no tonsillar enlargement.        Neck: No JVD, no bruit, no mass felt. Heme: No neck lymph node enlargement. Cardiac: S1/S2, RRR, No murmurs, No gallops or rubs. Pulm: No rales, wheezing, rhonchi or rubs. Abd: Soft, nondistended, nontender, no rebound pain, no organomegaly, BS present. GU: No hematuria Ext: No pitting leg edema bilaterally. 2+DP/PT pulse bilaterally. Musculoskeletal: No joint deformities, No joint redness or warmth, no limitation of ROM in spin. Skin: No rashes.  Neuro: Alert, oriented X3, cranial nerves II-XII grossly intact, moves all extremities normally. Muscle strength 5/5 in all extremities, sensation to light touch intact. Brachial reflex 2+ bilaterally.  Negative Babinski's sign. Normal finger to nose test. Psych: Patient is not psychotic, no suicidal or hemocidal ideation.  Labs on Admission: I have personally reviewed following labs and imaging studies  CBC:  Recent Labs Lab 09/04/15 2324  WBC 23.5*  NEUTROABS 20.5*  HGB 12.7  HCT 40.1  MCV 89.3  PLT 419*   Basic Metabolic Panel:  Recent Labs Lab 09/04/15 2324  NA 132*  K 5.2*  CL 100*  CO2 20*  GLUCOSE 852*  BUN 22*  CREATININE 1.54*  CALCIUM 8.5*   GFR: Estimated Creatinine Clearance: 53.8 mL/min (by C-G formula based on SCr of 1.54 mg/dL). Liver Function Tests:  Recent Labs Lab 09/04/15 2324  AST 20  ALT 12*  ALKPHOS 84  BILITOT 0.5  PROT 7.7  ALBUMIN 3.9    Recent Labs Lab 09/04/15 2324  LIPASE 27   No results for input(s): AMMONIA in the last 168 hours. Coagulation Profile: No results for input(s): INR, PROTIME in the last 168 hours. Cardiac Enzymes: No results for input(s): CKTOTAL, CKMB, CKMBINDEX, TROPONINI in the last 168 hours. BNP (last 3  results) No results for input(s): PROBNP in the last 8760 hours. HbA1C: No results for input(s): HGBA1C in the last 72 hours. CBG:  Recent Labs Lab 09/05/15 0119 09/05/15 0223 09/05/15 0323 09/05/15 0428 09/05/15 0523  GLUCAP >600* 465* 424* 302* 209*   Lipid Profile: No results for input(s): CHOL, HDL, LDLCALC, TRIG, CHOLHDL, LDLDIRECT in the last 72 hours. Thyroid Function Tests: No results for input(s): TSH, T4TOTAL, FREET4, T3FREE, THYROIDAB in the last 72 hours. Anemia Panel: No results for input(s): VITAMINB12, FOLATE, FERRITIN, TIBC, IRON, RETICCTPCT in the last 72 hours. Urine analysis:    Component Value Date/Time   COLORURINE YELLOW 09/04/2015 0027   APPEARANCEUR CLEAR 09/04/2015 0027   LABSPEC 1.019 09/04/2015 0027   PHURINE 6.5 09/04/2015 0027   GLUCOSEU >1000 (A) 09/04/2015 0027   HGBUR MODERATE (A) 09/04/2015 0027   BILIRUBINUR NEGATIVE 09/04/2015 0027   KETONESUR NEGATIVE  09/04/2015 0027   PROTEINUR 30 (A) 09/04/2015 0027   UROBILINOGEN 1.0 03/16/2015 1713   NITRITE NEGATIVE 09/04/2015 0027   LEUKOCYTESUR NEGATIVE 09/04/2015 0027   Sepsis Labs: (procalcitonin:4,lacticidven:4) ) Recent Results (from the past 240 hour(s))  MRSA PCR Screening     Status: None   Collection Time: 09/05/15  3:02 AM  Result Value Ref Range Status   MRSA by PCR NEGATIVE NEGATIVE Final    Comment:        The GeneXpert MRSA Assay (FDA approved for NASAL specimens only), is one component of a comprehensive MRSA colonization surveillance program. It is not intended to diagnose MRSA infection nor to guide or monitor treatment for MRSA infections.      Radiological Exams on Admission: No results found.   EKG:  Not done in ED, will get one.   Assessment/Plan Principal Problem:   Type 1 diabetes mellitus with hyperosmolarity without nonketotic hyperglycemic hyperosmolar coma (HCC) Active Problems:   Depression   Leukocytosis   Dehydration   Chronic  hepatitis C 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)   Hyperkalemia   Syncope   Acute kidney injury (nontraumatic) (HCC)   Heroin abuse   Type 1 diabetes mellitus with hyperosmolarity without nonketotic hyperglycemic hyperosmolar coma: Blood sugar 852 with normal anion gap, but bicarbonate is 20, indicating patient may be in early stage of DKA. Mental status currently normal. A1c was 8.2 on 02/19/12, poorly controlled diabetes-I.  -will place on SDU -continue Insulin gtt -cbg q1h -IVF: 3L of NS, then 125 cc/h -BMP q4h -consult to diabetic educator  Syncope: Etiology is not clear, but likely due to multifactorial etiology, including orthostatic status secondary to dehydration due to severe hyperglycemia and drug abuse. Currently mental status is normal. No focal neurologic findings on physical examination. -Frequent neuro check -IV fluid for hydration as above -Check orthostatic vital sign  Leukocytosis: no fever or other signs of infection. Likely due to stress induced to demargination, but pt is IVDU, at risk of getting serious infection. -will get blood culture if develops fevere -follow up by CBC  Depression and OCD: no suicidal or homicidal ideations. -Continue home medications: Celexa and VYVANSE  Mild Hyperkalemia: K=5.2 -will check EKG -expect correction with INV and Insulin gtt  AKI: Likely due to prerenal secondary to dehydration - IVF as above - Check FeNa  - Follow up renal function by BMP  Polysubstance abuse: Including hearing, marijuana, barbiturate -Did counseling about importance of quittingsubstance -Consult to social work   DVT ppx: SQ Lovenox Code Status: Full code Family Communication: None at bed side.  Disposition Plan:  Anticipate discharge back to previous home environment Consults called:  none Admission status: SDU/obs  Date of Service 09/05/2015    Lorretta Harp [facility] Pager [phone]  If 7PM-7AM, please contact  night-coverage [email or web address] Password [facility] 09/05/2015, 5:49 AM

## 2015-09-05 NOTE — Progress Notes (Addendum)
  Lvemir 32 units daily and humalog 1-15 units coverage on discharge   Manson Passeylma Abeer Iversen [facility] [phone]

## 2015-09-05 NOTE — Progress Notes (Signed)
Hypoglycemic Event  CBG: 52  Treatment: D50 IV 25 mL  Symptoms: None  Follow-up CBG: Time:0813 CBG Result:130  Possible Reasons for Event: Unknown  Comments/MD notified:Devine, MD    Lucetta Baehr D

## 2015-09-05 NOTE — Progress Notes (Signed)
Inpatient Diabetes Program Recommendations  AACE/ADA: New Consensus Statement on Inpatient Glycemic Control (2015)  Target Ranges:  Prepandial:   less than 140 mg/dL      Peak postprandial:   less than 180 mg/dL (1-2 hours)      Critically ill patients:  140 - 180 mg/dL   Lab Results  Component Value Date   GLUCAP 245 (H) 09/05/2015   HGBA1C 8.2 (H) 02/19/2012    Review of Glycemic Control Inpatient Diabetes Program Recommendations:    Noted patient remains on insulin drip at this time.  After talking with patient, she is requesting that she get the insulins that work best for and she can [facility]. The MD there told patient that she was not familiar with ordering insulin other than 70/30. Patient then had to purchase Regular insulin to correct at home which was very difficult.  For discharge, please order levemir (vial) at 30 units (use to take 32-40 units/day) (vial since she has syringes)  And novolog  (vial) 1 unit per 5 gms carbohydrate and 1 units per 25 mg/dL over 161120 mg/dL. (cbg goal). If vials are not available, patient can use the levemir insulin pen and insulin pen and pen needles.  (Patient has been on the novolog 70/30 mix, not the previously thought novolin 70/30 as documented on discharge orders.)  Patient can get her levemir and humalog at the [facility] if she has a prescription.  Thank you Lenor CoffinAnn Stori Royse, RN, MSN, CDE  Diabetes Inpatient Program Office: [phone] Pager: [phone] 8:00 am to 5:00 pm

## 2015-09-05 NOTE — Progress Notes (Signed)
Noted patient has order for levemir at discharge.  However this is only basal insulin and will not cover her meals and correction tidwc. Please add humalog rapid acting insulin for correction and meals tidwc. Pt is well educated on use and dosage. If not ordered, [facility] which is extremely difficult to mange cbg's due to timing of R.  Thank you Lenor CoffinAnn Franki Alcaide, RN, MSN, CDE  Diabetes Inpatient Program Office: [phone] Pager: [phone] 8:00 am to 5:00 pm

## 2015-09-05 NOTE — Discharge Instructions (Signed)
Blood Glucose Monitoring, Adult ° °Monitoring your blood glucose (also know as blood sugar) helps you to manage your diabetes. It also helps you and your health care provider monitor your diabetes and determine how well your treatment plan is working. °WHY SHOULD YOU MONITOR YOUR BLOOD GLUCOSE? °· It can help you understand how food, exercise, and medicine affect your blood glucose. °· It allows you to know what your blood glucose is at any given moment. You can quickly tell if you are having low blood glucose (hypoglycemia) or high blood glucose (hyperglycemia). °· It can help you and your health care provider know how to adjust your medicines. °· It can help you understand how to manage an illness or adjust medicine for exercise. °WHEN SHOULD YOU TEST? °Your health care provider will help you decide how often you should check your blood glucose. This may depend on the type of diabetes you have, your diabetes control, or the types of medicines you are taking. Be sure to write down all of your blood glucose readings so that this information can be reviewed with your health care provider. See below for examples of testing times that your health care provider may suggest. °Type 1 Diabetes °· Test at least 2 times per day if your diabetes is well controlled, if you are using an insulin pump, or if you perform multiple daily injections. °· If your diabetes is not well controlled or if you are sick, you may need to test more often. °· It is a good idea to also test: °¨ Before every insulin injection. °¨ Before and after exercise. °¨ Between meals and 2 hours after a meal. °¨ Occasionally between 2:00 a.m. and 3:00 a.m. °Type 2 Diabetes °· If you are taking insulin, test at least 2 times per day. However, it is best to test before every insulin injection. °· If you take medicines by mouth (orally), test 2 times a day. °· If you are on a controlled diet, test once a day. °· If your diabetes is not well controlled or if you  are sick, you may need to monitor more often. °HOW TO MONITOR YOUR BLOOD GLUCOSE °Supplies Needed °· Blood glucose meter. °· Test strips for your meter. Each meter has its own strips. You must use the strips that go with your own meter. °· A pricking needle (lancet). °· A device that holds the lancet (lancing device). °· A journal or log book to write down your results. °Procedure °· Wash your hands with soap and water. Alcohol is not preferred. °· Prick the side of your finger (not the tip) with the lancet. °· Gently milk the finger until a small drop of blood appears. °· Follow the instructions that come with your meter for inserting the test strip, applying blood to the strip, and using your blood glucose meter. °Other Areas to Get Blood for Testing °Some meters allow you to use other areas of your body (other than your finger) to test your blood. These areas are called alternative sites. The most common alternative sites are: °· The forearm. °· The thigh. °· The back area of the lower leg. °· The palm of the hand. °The blood flow in these areas is slower. Therefore, the blood glucose values you get may be delayed, and the numbers are different from what you would get from your fingers. Do not use alternative sites if you think you are having hypoglycemia. Your reading will not be accurate. Always use a finger if you   are having hypoglycemia. Also, if you cannot feel your lows (hypoglycemia unawareness), always use your fingers for your blood glucose checks. °ADDITIONAL TIPS FOR GLUCOSE MONITORING °· Do not reuse lancets. °· Always carry your supplies with you. °· All blood glucose meters have a 24-hour "hotline" number to call if you have questions or need help. °· Adjust (calibrate) your blood glucose meter with a control solution after finishing a few boxes of strips. °BLOOD GLUCOSE RECORD KEEPING °It is a good idea to keep a daily record or log of your blood glucose readings. Most glucose meters, if not all,  keep your glucose records stored in the meter. Some meters come with the ability to download your records to your home computer. Keeping a record of your blood glucose readings is especially helpful if you are wanting to look for patterns. Make notes to go along with the blood glucose readings because you might forget what happened at that exact time. Keeping good records helps you and your health care provider to work together to achieve good diabetes management.  °  °This information is not intended to replace advice given to you by your health care provider. Make sure you discuss any questions you have with your health care provider. °  °Document Released: 01/17/2003 Document Revised: 02/04/2014 Document Reviewed: 06/08/2012 °Elsevier Interactive Patient Education ©2016 [employer] ° °

## 2015-09-05 NOTE — Discharge Summary (Signed)
Physician Discharge Summary  Anna Dennis ZOX:[id number] DOB: 1990/11/03 DOA: 09/04/2015  PCP: [facility]  Admit date: 09/04/2015 Discharge date: 09/05/2015  Recommendations for Outpatient Follow-up:  1. 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 female with medical history significant of type 1 diabetes, GERD, polysubstance abuse, IV drug user, OCD, HCV, depression, who presents with syncope. Per report, pt's mother and sister called EMS because pt did heroin intravenously and passed out tonight. She was found unresponsive. Not sure for how long.  Pt was awake upon EMS arrival and not given Narcan. Per EMS, pt's blood sugar was significantly elevated. Pt states that she used to use of insulin pump which broke. She is currently taking mixed insulin 70/30 (24 units in the morning and 20 units in evening).She states that she has not been able to see an endocrinologist due to insurance. She is reporting associated polydipsia and polyuria due to her elevated CBG. Pt states that she is attempting to detox from heroin and get into a methadone clinic. Pt denies chest pain, shortness of breath, cough, fever, chills, nausea, vomiting, abdominal pain, diarrhea or unilateral weakness. ED Course: pt was found to have blood sugar 852, anion gap 12, bicarbonate 20, WBC 23.5, lipase 27, temperature normal, tachycardia, no tachypnea, potassium 5.2, AKI with creatinine 1.54, BUN 22. UDS is positive  for barbiturates, opiates and THC. Insulin gtt was started in ED. 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 On BMP anion gap 12. - Started on insulin drip - CBG in past 12 hours: 142, 182, 245 - May be discharged home today with NPH regular insulin as per prior to this admission  Active Problems:   Hyperkalemia - Due to hyperglycemia - Normalized with insulin    Depression - Continue celexa    Leukocytosis - Likely reactive - WBC count 23.5 on admission and spontaneously trended down to 13.5 - No evidence of acute infectious process    Syncope / Heroin abuse - Due to drug abuse - Counseled on cassation - Stable gait    Acute kidney injury (nontraumatic) (HCC) - Due to dehydration from hyperglycemia - Cr now WNL - Normalized with IV fluids    Signed:  Manson Passey, MD  [facility] 09/05/2015, 12:26 PM  Pager #: [phone]  Time spent in minutes: more than 30 minutes  Procedures:  None   Consultations:  DM coordinator   Discharge Exam: Vitals:   09/05/15 1100 09/05/15 1200  BP: 128/78   Pulse: 97   Resp: 16   Temp:  99 F (37.2 C)   Vitals:   09/05/15 0900 09/05/15 1000 09/05/15 1100 09/05/15 1200  BP: 120/72 (!) 135/98 128/78   Pulse: 80 85 97   Resp: Temp:    99 F (37.2  C)  TempSrc:    Oral  SpO2: 98% 99% 100%   Weight:      Height:        General: Pt is alert, follows commands appropriately, not in acute distress Cardiovascular: Regular rate and rhythm, S1/S2 +, no murmurs Respiratory: Clear to auscultation bilaterally, no wheezing, no crackles, no rhonchi Abdominal: Soft, non tender, non distended, bowel sounds +, no guarding Extremities: no edema, no cyanosis, pulses palpable bilaterally DP and PT Neuro:  Grossly nonfocal  Discharge Instructions  Discharge Instructions    Call MD for:  persistant nausea and vomiting    Complete by:  As directed   Call MD for:  severe uncontrolled pain    Complete by:  As directed   Diet - low sodium heart healthy    Complete by:  As directed   Increase activity slowly    Complete by:  As directed       Medication List    STOP taking these medications   insulin glargine 100 UNIT/ML injection Commonly known as:  LANTUS   insulin glulisine 100 UNIT/ML injection Commonly known as:  APIDRA     TAKE these medications   citalopram 20 MG tablet Commonly known as:  CELEXA Take 20 mg by mouth daily.   insulin NPH-regular Human (70-30) 100 UNIT/ML injection Commonly known as:  NOVOLIN 70/30 Inject 20-24 Units into the skin daily. 24 units in the morning and 20 units at night   lisdexamfetamine 20 MG capsule Commonly known as:  VYVANSE Take 20 mg by mouth daily.      Follow-up Information    [facility]. Schedule an appointment as soon as possible for a visit in 1 week(s).   Contact information: [address] [place] [address] [phone]            The results of significant diagnostics from this hospitalization (including imaging, microbiology, ancillary and laboratory) are listed below for reference.    Significant Diagnostic Studies: No results found.  Microbiology: Recent Results (from the past 240 hour(s))  MRSA PCR Screening     Status: None   Collection Time: 09/05/15  3:02 AM  Result Value Ref Range Status   MRSA by PCR NEGATIVE NEGATIVE Final     Labs: Basic Metabolic Panel:  Recent Labs Lab 09/04/15 2324 09/05/15 0545 09/05/15 0941  NA 132* 139 137  K 5.2* 3.8 3.8  CL 100* 109 109  CO2 20* 23 23  GLUCOSE 852* 149* 164*  BUN 22* 17 16  CREATININE 1.54* 0.86 0.68  CALCIUM 8.5* 8.2* 8.1*   Liver Function Tests:  Recent Labs Lab 09/04/15 2324  AST 20  ALT 12*  ALKPHOS 84   BILITOT 0.5  PROT 7.7  ALBUMIN 3.9    Recent Labs Lab 09/04/15 2324  LIPASE 27   No results for input(s): AMMONIA in the last 168 hours. CBC:  Recent Labs Lab 09/04/15 2324 09/05/15 0941  WBC 23.5* 13.5*  NEUTROABS 20.5*  --   HGB 12.7 10.4*  HCT 40.1 31.8*  MCV 89.3 87.4  PLT 419* 348   Cardiac Enzymes: No results for input(s): CKTOTAL, CKMB, CKMBINDEX, TROPONINI in the last 168 hours. BNP: BNP (last 3 results) No results for input(s): BNP in the last 8760 hours.  ProBNP (last 3 results) No results for input(s): PROBNP in the last 8760 hours.  CBG:  Recent Labs Lab 09/05/15 0733 09/05/15 0813 09/05/15 0905 09/05/15 1009 09/05/15 1146  GLUCAP 52*  130* 142* 182* 245*       

## 2015-09-06 LAB — HEMOGLOBIN A1C
Hgb A1c MFr Bld: 13.7 % — ABNORMAL HIGH (ref 4.8–5.6)
Mean Plasma Glucose: 346 mg/dL

## 2015-09-06 LAB — HIV ANTIBODY (ROUTINE TESTING W REFLEX): HIV SCREEN 4TH GENERATION: NONREACTIVE

## 2016-07-07 ENCOUNTER — Encounter (HOSPITAL_COMMUNITY): Payer: Self-pay | Admitting: Emergency Medicine

## 2016-07-07 DIAGNOSIS — Y929 Unspecified place or not applicable: Secondary | ICD-10-CM | POA: Insufficient documentation

## 2016-07-07 DIAGNOSIS — E1300 Other specified diabetes mellitus with hyperosmolarity without nonketotic hyperglycemic-hyperosmolar coma (NKHHC): Secondary | ICD-10-CM | POA: Insufficient documentation

## 2016-07-07 DIAGNOSIS — F17210 Nicotine dependence, cigarettes, uncomplicated: Secondary | ICD-10-CM | POA: Insufficient documentation

## 2016-07-07 DIAGNOSIS — K732 Chronic active hepatitis, not elsewhere classified: Secondary | ICD-10-CM | POA: Insufficient documentation

## 2016-07-07 DIAGNOSIS — Y9389 Activity, other specified: Secondary | ICD-10-CM | POA: Insufficient documentation

## 2016-07-07 DIAGNOSIS — Y999 Unspecified external cause status: Secondary | ICD-10-CM | POA: Insufficient documentation

## 2016-07-07 DIAGNOSIS — Z794 Long term (current) use of insulin: Secondary | ICD-10-CM | POA: Insufficient documentation

## 2016-07-07 DIAGNOSIS — S61215A Laceration without foreign body of left ring finger without damage to nail, initial encounter: Secondary | ICD-10-CM | POA: Insufficient documentation

## 2016-07-07 DIAGNOSIS — W25XXXA Contact with sharp glass, initial encounter: Secondary | ICD-10-CM | POA: Insufficient documentation

## 2016-07-07 DIAGNOSIS — Z79899 Other long term (current) drug therapy: Secondary | ICD-10-CM | POA: Insufficient documentation

## 2016-07-07 NOTE — ED Triage Notes (Signed)
Pt states while washing dishes cut L ring finger on broken vase. Heavy bleeding requiring director pressure in triage. Pressure dressing applied. CMS intact

## 2016-07-08 ENCOUNTER — Emergency Department (HOSPITAL_COMMUNITY)
Admission: EM | Admit: 2016-07-08 | Discharge: 2016-07-08 | Disposition: A | Discharge status: Home / Self Care | Payer: Self-pay | Attending: Physician Assistant | Admitting: Physician Assistant

## 2016-07-08 DIAGNOSIS — S61215A Laceration without foreign body of left ring finger without damage to nail, initial encounter: Secondary | ICD-10-CM

## 2016-07-08 MED ORDER — CEPHALEXIN 500 MG PO CAPS: 500.0000 mg | ORAL_CAPSULE | Freq: Four times a day (QID) | ORAL | 0 refills | Status: DC

## 2016-07-08 MED ORDER — IBUPROFEN 600 MG PO TABS: 600.0000 mg | ORAL_TABLET | Freq: Four times a day (QID) | ORAL | 0 refills | Status: DC | PRN

## 2016-07-08 MED ORDER — LIDOCAINE HCL 2 % IJ SOLN
10.0000 mL | Freq: Once | INTRAMUSCULAR | Status: AC
  Administered 2016-07-08: 200 mg
  Filled 2016-07-08: qty 20

## 2016-07-08 NOTE — ED Provider Notes (Signed)
[facility]-EMERGENCY DEPT Provider Note   CSN: [id number] Arrival date & time: 07/07/16  2308     History   Chief Complaint Chief Complaint  Patient presents with  . Finger Injury    HPI Anna Dennis is a 26 y.o. female.  HPI   Anna Dennis is a 26 y.o. female, with a history of Diabetes, presenting to the ED with laceration to left ring finger that occurred this evening. Patient states she cut her finger on a broken vase. Direct pressure controlling bleeding. Denies numbness, tingling, weakness, or any other complaints or injuries.      Past Medical History:  Diagnosis Date  . Depression   . Diabetes mellitus    Insulin pump  . GERD (gastroesophageal reflux disease)   . Hepatitis C   . Heroin abuse   . Iron deficiency anemia   . IVDU (intravenous drug user)   . OCD (obsessive compulsive disorder)     Patient Active Problem List   Diagnosis Date Noted  . Hyperkalemia 09/05/2015  . Syncope 09/05/2015  . Acute kidney injury (nontraumatic) (HCC)   . Heroin abuse   . 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 Dehydration 02/20/2012  . Sinus tachycardia 02/20/2012  . Leukocytosis 02/19/2012  . GERD (gastroesophageal reflux disease) 02/19/2012  . DKA (diabetic ketoacidoses) (HCC) 01/19/2012  . 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 Depression 03/14/2009    History reviewed. No pertinent surgical history.  OB History    No data available       Home Medications    Prior to Admission medications   Medication Sig Start Date End Date Taking? Authorizing Provider  cephALEXin (KEFLEX) 500 MG capsule Take 1 capsule (500 mg total) by mouth 4 (four) times daily. 07/08/16   Kimaria Struthers C, PA-C  citalopram (CELEXA) 20 MG tablet Take 20 mg by mouth daily.    [provider]  ibuprofen (ADVIL,MOTRIN) 600 MG tablet Take 1 tablet (600 mg  total) by mouth every 6 (six) hours as needed. 07/08/16   Zuriah Bordas C, PA-C  insulin detemir (LEVEMIR) 100 UNIT/ML injection Inject 0.32 mLs (32 Units total) into the skin at bedtime. 09/05/15   Alison Murray, MD  insulin lispro (HUMALOG) 100 UNIT/ML injection Use 1-15 TID AC as needed 09/05/15   Alison Murray, MD  lisdexamfetamine (VYVANSE) 20 MG capsule Take 20 mg by mouth daily.    [provider]    Family History Family History  Problem Relation Age of Onset  . Bipolar disorder Mother   . Schizophrenia Sister     Social History Social History  Substance Use Topics  . Smoking status: Current Every Day Smoker    Types: Cigarettes  . Smokeless tobacco: Never Used  . Alcohol use 0.0 oz/week     Comment: occasional     Allergies   Patient has no known allergies.   Review of Systems Review of Systems  Skin: Positive for wound.  Neurological: Negative for weakness and numbness.     Physical Exam Updated Vital Signs Ht 5\' 7"  (1.702 m)   Wt 88.5 kg (195 lb)   LMP 06/06/2016   BMI 30.54 kg/m   Physical Exam  Constitutional: She appears well-developed and well-nourished. No distress.  HENT:  Head: Normocephalic and atraumatic.  Eyes: Conjunctivae are normal.  Neck: Neck supple.  Cardiovascular: Normal rate and regular rhythm.   Pulmonary/Chest: Effort  normal.  Musculoskeletal: Normal range of motion.  Full range of motion at the DIP, PIP, and MCP joints of the left ring finger.   Neurological: She is alert.  No sensory deficits to the distal tip of the left ring finger. Strength 5 out of 5 at the DIP, PIP, and MCP joints.  Skin: Skin is warm and dry. Capillary refill takes less than 2 seconds. She is not diaphoretic.  1.5 cm laceration to the palmar left ring finger just proximal to the PIP joint. Depth of wound visualized with no noted tendon damage or foreign body.  Psychiatric: She has a normal mood and affect. Her behavior is normal.  Nursing note and  vitals reviewed.    ED Treatments / Results  Labs (all labs ordered are listed, but only abnormal results are displayed) Labs Reviewed - No data to display  EKG  EKG Interpretation None       Radiology No results found.  Procedures .Nerve Block Date/Time: 07/08/2016 1:45 AM Performed by: Anselm PancoastJOY, Nolyn Swab C Authorized by: Anselm PancoastJOY, Nichole Neyer C   Consent:    Consent obtained:  Verbal   Consent given by:  Patient Indications:    Indications:  Procedural anesthesia Location:    Body area:  Upper extremity   Upper extremity nerve blocked: Digital.   Laterality:  Left Pre-procedure details:    Skin preparation:  Povidone-iodine Procedure details (see MAR for exact dosages):    Block needle gauge:  25 G   Anesthetic injected:  Lidocaine 2% w/o epi   Injection procedure:  Anatomic landmarks identified, anatomic landmarks palpated, introduced needle, incremental injection and negative aspiration for blood Post-procedure details:    Outcome:  Anesthesia achieved   Patient tolerance of procedure:  Tolerated well, no immediate complications .Marland Kitchen.Laceration Repair Date/Time: 07/08/2016 2:00 AM Performed by: Anselm PancoastJOY, Vasilis Luhman C Authorized by: Anselm PancoastJOY, Merced Brougham C   Consent:    Consent obtained:  Verbal   Consent given by:  Patient Anesthesia (see MAR for exact dosages):    Anesthesia method:  Nerve block   Block location:  Digital   Block needle gauge:  25 G   Block anesthetic:  Lidocaine 2% w/o epi   Block injection procedure:  Anatomic landmarks identified, anatomic landmarks palpated, negative aspiration for blood, introduced needle and incremental injection   Block outcome:  Anesthesia achieved Laceration details:    Location:  Finger   Finger location:  L ring finger   Length (cm):  1.5 Repair type:    Repair type:  Simple Pre-procedure details:    Preparation:  Patient was prepped and draped in usual sterile fashion Exploration:    Hemostasis achieved with:  Direct pressure   Wound  exploration: wound explored through full range of motion and entire depth of wound probed and visualized   Treatment:    Area cleansed with:  Betadine and saline   Amount of cleaning:  Extensive   Irrigation solution:  Sterile saline   Irrigation method:  Syringe Skin repair:    Repair method:  Sutures   Suture size:  5-0   Suture material:  Prolene   Suture technique:  Simple interrupted   Number of sutures:  4 Approximation:    Approximation:  Close Post-procedure details:    Dressing:  Sterile dressing and splint for protection   Patient tolerance of procedure:  Tolerated well, no immediate complications   (including critical care time)  Medications Ordered in ED Medications  lidocaine (XYLOCAINE) 2 % (with pres) injection 200 mg (  200 mg Infiltration Given 07/08/16 0140)     Initial Impression / Assessment and Plan / ED Course  I have reviewed the triage vital signs and the nursing notes.  Pertinent labs & imaging results that were available during my care of the patient were reviewed by me and considered in my medical decision making (see chart for details).     Patient presents with finger laceration. No noted neuro or functional deficits. Placed on antibiotics due to being a diabetic. Wound care and return precautions discussed. Patient voices understanding of all instructions and is comfortable with discharge.    Final Clinical Impressions(s) / ED Diagnoses   Final diagnoses:  Laceration of left ring finger without foreign body without damage to nail, initial encounter    New Prescriptions Discharge Medication List as of 07/08/2016  2:30 AM    START taking these medications   Details  cephALEXin (KEFLEX) 500 MG capsule Take 1 capsule (500 mg total) by mouth 4 (four) times daily., Starting Mon 07/08/2016, Print    ibuprofen (ADVIL,MOTRIN) 600 MG tablet Take 1 tablet (600 mg total) by mouth every 6 (six) hours as needed., Starting Mon 07/08/2016, Print           Cecilee Rosner, [place] C, PA-C 07/09/16 0703    Abelino Derrick, MD 07/11/16 [phone]

## 2016-07-08 NOTE — Discharge Instructions (Signed)
Remove the bandage after 24 hours. You must wait at least 8 hours after the wound repair to wash the wound. Clean the wound and surrounding area gently with tap water and mild soap. Rinse well and blot dry. Do not scrub the wound, as this may cause the wound edges to come apart. You may shower, but avoid submerging the wound, such as with a bath or swimming. Clean the wound daily to prevent infection. Do not use cleaners such as hydrogen peroxide or alcohol.   Scar reduction: Application of a topical antibiotic ointment, such as Neosporin, after the wound has begun to close and heal well can decrease scab formation and reduce scarring. After the wound has healed and wound closures have been removed, application of ointments such as Aquaphor can also reduce scar formation.  Pain: You may use Tylenol, naproxen, or ibuprofen for pain.  Please take all of your antibiotics until finished!   You may develop abdominal discomfort or diarrhea from the antibiotic.  You may help offset this with probiotics which you can buy or get in yogurt. Do not eat or take the probiotics until 2 hours after your antibiotic.   Suture/staple removal: Return to the ED in 10-12 days for suture removal.  Return to the ED sooner should the wound edges come apart or signs of infection arise, such as spreading redness, puffiness/swelling, pus draining from the wound, severe increase in pain, or any other major issues.

## 2016-07-25 ENCOUNTER — Encounter (HOSPITAL_COMMUNITY): Payer: Self-pay

## 2016-07-25 ENCOUNTER — Emergency Department (HOSPITAL_COMMUNITY)
Admission: EM | Admit: 2016-07-25 | Discharge: 2016-07-25 | Disposition: A | Discharge status: Home / Self Care | Payer: Self-pay | Attending: Emergency Medicine | Admitting: Emergency Medicine

## 2016-07-25 DIAGNOSIS — R739 Hyperglycemia, unspecified: Secondary | ICD-10-CM

## 2016-07-25 DIAGNOSIS — E1065 Type 1 diabetes mellitus with hyperglycemia: Secondary | ICD-10-CM | POA: Insufficient documentation

## 2016-07-25 DIAGNOSIS — F17210 Nicotine dependence, cigarettes, uncomplicated: Secondary | ICD-10-CM | POA: Insufficient documentation

## 2016-07-25 LAB — URINALYSIS, ROUTINE W REFLEX MICROSCOPIC
BACTERIA UA: NONE SEEN
Bilirubin Urine: NEGATIVE
Glucose, UA: >=500 mg/dL — AB
Hgb urine dipstick: NEGATIVE
Ketones, ur: 80 mg/dL — AB
Leukocytes, UA: NEGATIVE
NITRITE: NEGATIVE
PH: 6 (ref 5.0–8.0)
Protein, ur: NEGATIVE mg/dL
RBC / HPF: NONE SEEN RBC/hpf (ref 0–5)
SPECIFIC GRAVITY, URINE: 1.025 (ref 1.005–1.030)
WBC, UA: NONE SEEN WBC/hpf (ref 0–5)

## 2016-07-25 LAB — CBG MONITORING, ED
GLUCOSE-CAPILLARY: 424 mg/dL — AB (ref 65–99)
GLUCOSE-CAPILLARY: 471 mg/dL — AB (ref 65–99)
Glucose-Capillary: >600 mg/dL (ref 65–99)
Glucose-Capillary: >600 mg/dL (ref 65–99)
Glucose-Capillary: 403 mg/dL — ABNORMAL HIGH (ref 65–99)

## 2016-07-25 LAB — BASIC METABOLIC PANEL
ANION GAP: 13 (ref 5–15)
Anion gap: 14 (ref 5–15)
BUN: 15 mg/dL (ref 6–20)
BUN: 16 mg/dL (ref 6–20)
CO2: 19 mmol/L — ABNORMAL LOW (ref 22–32)
CO2: 16 mmol/L — ABNORMAL LOW (ref 22–32)
Calcium: 8.7 mg/dL — ABNORMAL LOW (ref 8.9–10.3)
Calcium: 8.4 mg/dL — ABNORMAL LOW (ref 8.9–10.3)
Chloride: 94 mmol/L — ABNORMAL LOW (ref 101–111)
Chloride: 99 mmol/L — ABNORMAL LOW (ref 101–111)
Creatinine, Ser: 1.09 mg/dL — ABNORMAL HIGH (ref 0.44–1.00)
Creatinine, Ser: 1.29 mg/dL — ABNORMAL HIGH (ref 0.44–1.00)
GFR calc Af Amer: >60 mL/min (ref >60)
GFR, EST NON AFRICAN AMERICAN: 56 mL/min — AB (ref >60)
GLUCOSE: 665 mg/dL — AB (ref 65–99)
Glucose, Bld: 496 mg/dL — ABNORMAL HIGH (ref 65–99)
POTASSIUM: 4.9 mmol/L (ref 3.5–5.1)
Potassium: 4 mmol/L (ref 3.5–5.1)
SODIUM: 128 mmol/L — AB (ref 135–145)
Sodium: 127 mmol/L — ABNORMAL LOW (ref 135–145)

## 2016-07-25 LAB — CBC
HEMATOCRIT: 37.2 % (ref 36.0–46.0)
Hemoglobin: 12 g/dL (ref 12.0–15.0)
MCH: 27.3 pg (ref 26.0–34.0)
MCHC: 32.3 g/dL (ref 30.0–36.0)
MCV: 84.7 fL (ref 78.0–100.0)
Platelets: 287 10*3/uL (ref 150–400)
RBC: 4.39 MIL/uL (ref 3.87–5.11)
RDW: 13.9 % (ref 11.5–15.5)
WBC: 6.7 10*3/uL (ref 4.0–10.5)

## 2016-07-25 LAB — I-STAT VENOUS BLOOD GAS, ED
Acid-base deficit: 9 mmol/L — ABNORMAL HIGH (ref 0.0–2.0)
BICARBONATE: 15 mmol/L — AB (ref 20.0–28.0)
O2 Saturation: 98 %
PH VEN: 7.359 (ref 7.250–7.430)
PO2 VEN: 115 mmHg — AB (ref 32.0–45.0)
TCO2: 16 mmol/L (ref 0–100)
pCO2, Ven: 26.6 mmHg — ABNORMAL LOW (ref 44.0–60.0)

## 2016-07-25 LAB — HCG, SERUM, QUALITATIVE: Preg, Serum: NEGATIVE

## 2016-07-25 MED ORDER — INSULIN ASPART 100 UNIT/ML ~~LOC~~ SOLN
10.0000 [IU] | Freq: Once | SUBCUTANEOUS | Status: AC
  Administered 2016-07-25: 10 [IU] via INTRAVENOUS
  Filled 2016-07-25: qty 1

## 2016-07-25 MED ORDER — POTASSIUM CHLORIDE CRYS ER 20 MEQ PO TBCR
40.0000 meq | EXTENDED_RELEASE_TABLET | Freq: Once | ORAL | Status: AC
  Administered 2016-07-25: 40 meq via ORAL
  Filled 2016-07-25: qty 2

## 2016-07-25 MED ORDER — SODIUM CHLORIDE 0.9 % IV BOLUS (SEPSIS)
1000.0000 mL | Freq: Once | INTRAVENOUS | Status: AC
  Administered 2016-07-25: 1000 mL via INTRAVENOUS

## 2016-07-25 MED ORDER — INSULIN ASPART 100 UNIT/ML ~~LOC~~ SOLN
12.0000 [IU] | Freq: Once | SUBCUTANEOUS | Status: AC
  Administered 2016-07-25: 12 [IU] via INTRAVENOUS
  Filled 2016-07-25: qty 1

## 2016-07-25 NOTE — ED Notes (Signed)
Pt in gown and on monitor 

## 2016-07-25 NOTE — ED Provider Notes (Signed)
[facility]-EMERGENCY DEPT Provider Note   CSN: [id number] Arrival date & time: 07/25/16  [id number]     History   Chief Complaint Chief Complaint  Patient presents with  . Hyperglycemia    HPI Anna Dennis is a 26 y.o. female.  The history is provided by the patient and the spouse.  Hyperglycemia  Blood sugar level PTA:  High! Severity:  Severe Onset quality:  Sudden Duration:  1 day Timing:  Constant Progression:  Unchanged Chronicity:  Recurrent Diabetes status:  Controlled with insulin Time since last antidiabetic medication:  1 day Context: noncompliance (pt rna out of insulin today)   Relieved by:  Nothing Ineffective treatments:  None tried Associated symptoms: dehydration, fatigue, increased thirst, nausea and polyuria   Associated symptoms: no abdominal pain, no altered mental status, no blurred vision, no chest pain, no confusion, no diaphoresis, no dysuria, no fever, no increased appetite, no shortness of breath, no vomiting and no weakness   Risk factors: hx of DKA     Past Medical History:  Diagnosis Date  . Depression   . Diabetes mellitus    Insulin pump  . GERD (gastroesophageal reflux disease)   . Hepatitis C   . Heroin abuse   . Iron deficiency anemia   . IVDU (intravenous drug user)   . OCD (obsessive compulsive disorder)     Patient Active Problem List   Diagnosis Date Noted  . Hyperkalemia 09/05/2015  . Syncope 09/05/2015  . Acute kidney injury (nontraumatic) (HCC)   . Heroin abuse   . 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 Dehydration 02/20/2012  . Sinus tachycardia 02/20/2012  . Leukocytosis 02/19/2012  . GERD (gastroesophageal reflux disease) 02/19/2012  . DKA (diabetic ketoacidoses) (HCC) 01/19/2012  . 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 Depression 03/14/2009    History reviewed. No pertinent surgical  history.  OB History    No data available       Home Medications    Prior to Admission medications   Medication Sig Start Date End Date Taking? Authorizing Provider  cephALEXin (KEFLEX) 500 MG capsule Take 1 capsule (500 mg total) by mouth 4 (four) times daily. 07/08/16   Joy, Shawn C, PA-C  citalopram (CELEXA) 20 MG tablet Take 20 mg by mouth daily.    [provider]  ibuprofen (ADVIL,MOTRIN) 600 MG tablet Take 1 tablet (600 mg total) by mouth every 6 (six) hours as needed. 07/08/16   Joy, Shawn C, PA-C  insulin detemir (LEVEMIR) 100 UNIT/ML injection Inject 0.32 mLs (32 Units total) into the skin at bedtime. 09/05/15   Alison Murrayevine, Alma M, MD  insulin lispro (HUMALOG) 100 UNIT/ML injection Use 1-15 TID AC as needed 09/05/15   Alison Murrayevine, Alma M, MD  lisdexamfetamine (VYVANSE) 20 MG capsule Take 20 mg by mouth daily.    [provider]    Family History Family History  Problem Relation Age of Onset  . Bipolar disorder Mother   . Schizophrenia Sister     Social History Social History  Substance Use Topics  . Smoking status: Current Every Day Smoker    Types: Cigarettes  . Smokeless tobacco: Never Used  . Alcohol use 0.0 oz/week     Comment: occasional     Allergies   Patient has no known allergies.   Review of Systems Review of Systems  Constitutional: Positive for fatigue. Negative for appetite change, chills, diaphoresis and  fever.  HENT: Negative for dental problem.   Eyes: Negative for blurred vision and visual disturbance.  Respiratory: Negative for chest tightness, shortness of breath and stridor.   Cardiovascular: Negative for chest pain.  Gastrointestinal: Positive for nausea. Negative for abdominal pain, constipation, diarrhea and vomiting.  Endocrine: Positive for polydipsia and polyuria.  Genitourinary: Negative for dysuria and flank pain.  Musculoskeletal: Negative for back pain.  Skin: Negative for rash and wound.  Neurological: Negative for  weakness, light-headedness, numbness and headaches.  Psychiatric/Behavioral: Negative for agitation and confusion.  All other systems reviewed and are negative.    Physical Exam Updated Vital Signs BP (!) 120/57   Pulse (!) 113   Temp 98.5 F (36.9 C) (Oral)   Resp 11   Ht 5\' 7"  (1.702 m)   Wt 95.3 kg (210 lb)   SpO2 96%   BMI 32.89 kg/m   Physical Exam  Constitutional: She is oriented to person, place, and time. She appears well-developed and well-nourished. No distress.  HENT:  Head: Normocephalic and atraumatic.  Right Ear: External ear normal.  Left Ear: External ear normal.  Nose: Nose normal.  Mouth/Throat: Oropharynx is clear and moist. No oropharyngeal exudate.  Eyes: Conjunctivae and EOM are normal. Pupils are equal, round, and reactive to light.  Neck: Normal range of motion. Neck supple.  Cardiovascular: Regular rhythm, normal heart sounds and intact distal pulses.  Tachycardia present.   No murmur heard. Pulmonary/Chest: Effort normal and breath sounds normal. No stridor. No respiratory distress. She has no wheezes.  Abdominal: Soft. Normal appearance. She exhibits no distension. There is no tenderness. There is no rebound.  Neurological: She is alert and oriented to person, place, and time. She has normal reflexes. She is not disoriented. She displays no tremor. No cranial nerve deficit or sensory deficit. She exhibits normal muscle tone. Coordination and gait normal. GCS eye subscore is 4. GCS verbal subscore is 5. GCS motor subscore is 6.  Skin: Skin is warm. Capillary refill takes less than 2 seconds. No rash noted. She is not diaphoretic. No erythema.  Psychiatric: She has a normal mood and affect.  Nursing note and vitals reviewed.    ED Treatments / Results  Labs (all labs ordered are listed, but only abnormal results are displayed) Labs Reviewed  BASIC METABOLIC PANEL - Abnormal; Notable for the following:       Result Value   Sodium 127 (*)     Chloride 94 (*)    CO2 19 (*)    Glucose, Bld 665 (*)    Creatinine, Ser 1.09 (*)    Calcium 8.7 (*)    All other components within normal limits  URINALYSIS, ROUTINE W REFLEX MICROSCOPIC - Abnormal; Notable for the following:    Color, Urine COLORLESS (*)    Glucose, UA >=500 (*)    Ketones, ur 80 (*)    Squamous Epithelial / LPF 0-5 (*)    All other components within normal limits  BASIC METABOLIC PANEL - Abnormal; Notable for the following:    Sodium 128 (*)    Chloride 99 (*)    CO2 16 (*)    Glucose, Bld 496 (*)    Creatinine, Ser 1.29 (*)    Calcium 8.4 (*)    GFR calc non Af Amer 56 (*)    All other components within normal limits  CBG MONITORING, ED - Abnormal; Notable for the following:    Glucose-Capillary >600 (*)    All other components within  normal limits  I-STAT VENOUS BLOOD GAS, ED - Abnormal; Notable for the following:    pCO2, Ven 26.6 (*)    pO2, Ven 115.0 (*)    Bicarbonate 15.0 (*)    Acid-base deficit 9.0 (*)    All other components within normal limits  CBG MONITORING, ED - Abnormal; Notable for the following:    Glucose-Capillary >600 (*)    All other components within normal limits  CBG MONITORING, ED - Abnormal; Notable for the following:    Glucose-Capillary 424 (*)    All other components within normal limits  CBG MONITORING, ED - Abnormal; Notable for the following:    Glucose-Capillary 471 (*)    All other components within normal limits  CBG MONITORING, ED - Abnormal; Notable for the following:    Glucose-Capillary 403 (*)    All other components within normal limits  CBC  HCG, SERUM, QUALITATIVE  BLOOD GAS, VENOUS    EKG  EKG Interpretation None       Radiology No results found.  Procedures Procedures (including critical care time)  Medications Ordered in ED Medications  sodium chloride 0.9 % bolus 1,000 mL (0 mLs Intravenous Stopped 07/25/16 0954)  insulin aspart (novoLOG) injection 10 Units (10 Units Intravenous Given  07/25/16 0959)  sodium chloride 0.9 % bolus 1,000 mL (0 mLs Intravenous Stopped 07/25/16 1341)  insulin aspart (novoLOG) injection 10 Units (10 Units Intravenous Given 07/25/16 1248)  potassium chloride SA (K-DUR,KLOR-CON) CR tablet 40 mEq (40 mEq Oral Given 07/25/16 1247)  insulin aspart (novoLOG) injection 12 Units (12 Units Intravenous Given 07/25/16 1605)     Initial Impression / Assessment and Plan / ED Course  I have reviewed the triage vital signs and the nursing notes.  Pertinent labs & imaging results that were available during my care of the patient were reviewed by me and considered in my medical decision making (see chart for details).     Tressie Ragin is a 26 y.o. female past medical history significant for type 1 diabetes with a history of DKA who presents with multiple complaints including nausea, fatigue, elevated glucose, thirst, and sensation of tachycardia. Patient reports that this is how she felt when her glucose was severely elevated in the past. She says that she was feeling normally until yesterday when she ran out of her insulin. He says that she was unable to get to the pharmacy until today and has not taken insulin overnight. Patient presents because she did not think she could wait that long.  Patient denies fevers, chills, chest pain, shortness of breath, vomiting, confusion, diarrhea, or dysuria. She does report some nausea. She reports fatigue. She denies any pain at this time.  On arrival, patient's glucose was elevated. One of care glucose read greater than 600. Initial BMP showed that glucose was 665. Tones were present in the urine however, anion gap was only 14. CO2 slightly decreased at 19. Patient will have a VBG to look for acidosis. CBC was grossly unremarkable.  Patient tachycardic on initial evaluation. Suspect dehydration.   On exam, patient's lungs are clear. Abdomen nontender. No focal neurologic deficits.  Patient was given fluids. Patient says  that when her sugars this high, she would normally take approximate 7 units of insulin. Patient will be given 10 and reassessed after rehydration. Patient's potassium is 4.9. As we are not doing an insulin drip, will recheck to see if potassium repletion as necessary. Patient will also have a pretty test.  Anticipate reassessment following  medications and fluids.  Patient reassessed and glucose was improving. Second glucose was 496. Patient given more fluids and insulin with improvement in glucose. Patient began feeling much better and rehydrated.  Patient's glucose continued to decline and was a 403. Patient was feeling that her glucose was within a more normal range now and she could manage at home. Patient continued to have no nausea vomiting or other complaints. Agent once to be discharged to go pick up her outpatient insulin management at the pharmacy and go home.   Shared decision-making conversation held with patient that we could continue watching her, rehydrated her, and providing more insulin or we could let her go home. Due to patient's wishes and well appearance, patient felt stable for discharge.  Overall, laboratory testing showed no evidence of DKA. Patient appeared well and was discharged in good condition. Patient understood strict return precautions for any new or worsened symptoms.   Final Clinical Impressions(s) / ED Diagnoses   Final diagnoses:  Hyperglycemia    New Prescriptions Discharge Medication List as of 07/25/2016  4:54 PM      Clinical Impression: 1. Hyperglycemia     Disposition: Discharge  Condition: Good  I have discussed the results, Dx and Tx plan with the pt(& family if present). He/she/they expressed understanding and agree(s) with the plan. Discharge instructions discussed at great length. Strict return precautions discussed and pt &/or family have verbalized understanding of the instructions. No further questions at time of discharge.     Discharge Medication List as of 07/25/2016  4:54 PM      Follow Up: [facility] [address] [place] [address] [phone]  Schedule an appointment as soon as possible for a visit    [facility] EMERGENCY DEPARTMENT [address] [id number] mc [place] [address] [phone]  If symptoms worsen     Yaritzi Craun, Canary Brim, MD 07/26/16 0800

## 2016-07-25 NOTE — ED Notes (Addendum)
ED Provider at bedside. Dr. Rush Landmarkegeler aware of pt CBG.

## 2016-07-25 NOTE — Discharge Instructions (Signed)
Please continue your outpatient insulin management for your diabetes. Your workup today did not show evidence of infection or significant electrolyte abnormalities. You are not in diabetic ketoacidosis. We were able to get your glucose down with fluids and insulin. Please follow-up with your primary care physician for further diabetes management. Please pick up your insulin on the way home. If any symptoms change or worsen, please return immediately to the nearest emergency department.

## 2016-07-25 NOTE — ED Triage Notes (Signed)
Pt states that she is type one diabetic and has not taken her insulin in one day, sugars are reading high.

## 2016-07-25 NOTE — ED Notes (Signed)
CBG: Hi  

## 2016-09-02 ENCOUNTER — Ambulatory Visit: Payer: Self-pay

## 2018-07-21 ENCOUNTER — Other Ambulatory Visit: Payer: Self-pay

## 2018-07-21 ENCOUNTER — Encounter (INDEPENDENT_AMBULATORY_CARE_PROVIDER_SITE_OTHER): Payer: Self-pay | Admitting: Family Medicine

## 2018-07-21 ENCOUNTER — Ambulatory Visit (INDEPENDENT_AMBULATORY_CARE_PROVIDER_SITE_OTHER): Payer: MEDICAID | Admitting: Family Medicine

## 2018-07-21 VITALS — BP 120/72 | HR 115 | Temp 97.9°F | Resp 16 | Wt 191.0 lb

## 2018-07-21 DIAGNOSIS — Z720 Tobacco use: Secondary | ICD-10-CM

## 2018-07-21 DIAGNOSIS — R000 Tachycardia, unspecified: Secondary | ICD-10-CM

## 2018-07-21 DIAGNOSIS — E109 Type 1 diabetes mellitus without complications: Principal | ICD-10-CM

## 2018-07-21 MED ORDER — INSULIN GLARGINE (U-100) 100 UNIT/ML SUBCUTANEOUS SOLUTION
45.00 [IU] | Freq: Every evening | SUBCUTANEOUS | 5 refills | Status: AC
Start: 2018-07-21 — End: ?

## 2018-07-21 MED ORDER — INSULIN ASPART U-100  100 UNIT/ML SUBCUTANEOUS SOLUTION
SUBCUTANEOUS | 5 refills | Status: AC
Start: 2018-07-21 — End: ?

## 2018-07-21 MED ORDER — INSULIN LISPRO (U-100) 100 UNIT/ML SUBCUTANEOUS PEN
PEN_INJECTOR | SUBCUTANEOUS | 11 refills | Status: AC
Start: 2018-07-21 — End: ?

## 2018-07-21 MED ORDER — LANTUS SOLOSTAR U-100 INSULIN 100 UNIT/ML (3 ML) SUBCUTANEOUS PEN
45.00 [IU] | PEN_INJECTOR | Freq: Every evening | SUBCUTANEOUS | 11 refills | Status: AC
Start: 2018-07-21 — End: ?

## 2018-07-21 MED ORDER — INSULIN LISPRO (U-100) 100 UNIT/ML SUBCUTANEOUS SOLUTION
SUBCUTANEOUS | 11 refills | Status: DC
Start: 2018-07-21 — End: 2018-07-21

## 2018-07-21 NOTE — Progress Notes (Signed)
[facility]  [place]  [place] [id number]        Encounter Date: 07/21/2018 11:30 AM EDT      Name: Cynthia Arias  Age: 28 y.o.  DOB: 12-30-1990  Sex: female    Chief Complaint: [place] (type 1 diabetes)    Cynthia Arias is here to establish.  She moved here from [place] to stay with relatives.  She has had DM I since age 39 and now has lost her medical card in [place] and was having to buy her insulin--able to only afford regular insulin. She has been having highs in the mornings since she has been out of the Lantus.  She had an insulin pump in the past but not since she lost her medical card.  She is applying for one in [place].     She was using Lantus 41 units at hs and lispro 1 unit for every 5 gm of carbohydrate. She is overdue for her eye exam but states the last one she had was ok. She is aware of needing a yearly exam to evaluate for retinopathy.  She has not had blood work or a urine A/C for a good while.    Her HR is up today but down to 100 after resting.  She states she walked here today from across town.     She is a smoker but down from 1 ppd to 1/2 ppd.  She states she tried the patch when she lived in [place] and quit--but then started again.          History   Past Medical History  Current Outpatient Medications   Medication Sig   . insulin aspart U-100 (NOVOLOG U-100 INSULIN ASPART) 100 unit/mL Subcutaneous Solution 1 unit per every 5gm carbohydrate   . insulin glargine (LANTUS SOLOSTAR U-100 INSULIN) 100 unit/mL Subcutaneous Insulin Pen 45 Units by Subcutaneous route Every night   . insulin glargine (LANTUS) 100 unit/mL Subcutaneous injection (vial) 45 Units by Subcutaneous route Every night   . insulin lispro (ADMELOG SOLOSTAR U-100 INSULIN) 100 unit/mL Subcutaneous Insulin Pen 1 unit per 5gm carbohydrate   . insulin regular human 100 unit/mL Injection Solution 60 Units by Subcutaneous route Three times daily before meals Sliding scale based on glucose readings     No  Known Allergies  Past Medical History:   Diagnosis Date   . Diabetes mellitus type 1 (CMS HCC)    . Murmur          Past Surgical History was reviewed and is negative for any surgeries      Family Medical History:     Problem Relation (Age of Onset)    Gout Father    Healthy Mother, Sister            Social History     Tobacco Use   . Smoking status: Current Every Day Smoker     Packs/day: 1.00     Years: 9.00     Pack years: 9.00   . Smokeless tobacco: Never Used   Substance and Sexual Activity   . Alcohol use: Not Currently     Comment: on occasion socially    . Drug use: Not Currently     Types: Marijuana         Review of Systems   Constitutional: Negative for activity change, appetite change, chills and fever.   HENT: Negative.    Respiratory: Negative for cough and shortness of breath.  Cardiovascular: Negative for chest pain.   Gastrointestinal: Negative for abdominal pain.   Genitourinary: Negative for menstrual problem.        Has had menses about q 2-3 months off any oral contraception-not heavy   Skin: Negative for rash.   Neurological: Negative.         Examination  Vitals: BP 120/72 (Site: Right, Patient Position: Sitting, Cuff Size: Adult)   Pulse (!) 115   Temp 36.6 C (97.9 F) (Thermal Scan)   Resp 16   Wt 86.6 kg (191 lb)   LMP 06/20/2018 (Approximate)   SpO2 96%   Breastfeeding No       Physical Exam   Constitutional: She is oriented to person, place, and time and well-developed, well-nourished, and in no distress. No distress.   HENT:   Head: Normocephalic and atraumatic.   Right Ear: External ear normal.   Left Ear: External ear normal.   Eyes: Conjunctivae are normal.   Neck: Neck supple.   Cardiovascular: Normal rate and regular rhythm.   No murmur heard.  Pulmonary/Chest: Effort normal and breath sounds normal. No respiratory distress. She has no wheezes. She has no rales.   Abdominal: Soft. She exhibits no mass. There is no abdominal tenderness.   Musculoskeletal: Normal range of  motion.         General: No edema.   Neurological: She is alert and oriented to person, place, and time. Gait normal.   Skin: No rash noted.   Psychiatric: Mood, memory, affect and judgment normal.       Assessment and Plan  (E10.9) Type 1 diabetes mellitus without complication (CMS HCC)  (primary encounter diagnosis)  Plan: CBC, COMPREHENSIVE METABOLIC PNL, FASTING,         HGA1C (HEMOGLOBIN A1C WITH EST AVG GLUCOSE),         LIPID PANEL, MICROALBUMIN/CREATININE RATIO,         URINE, RANDOM, THYROID STIMULATING HORMONE         (SENSITIVE TSH)    (R00.0) Tachycardia  Plan: THYROID STIMULATING HORMONE (SENSITIVE TSH)    (Z72.0) Tobacco use    Will order Lantus and admelog solostar and today will pick up Lantus and Novalog and meter and strips     She states she is not sexually active and does not need birth control but was [facility] the [facility] to [facility] for appt for pap.    Refer to the QUIT line.    Return in about 3 months (around 10/21/2018).    Cynthia BongoMary Bricelyn Freestone, MD

## 2018-07-25 ENCOUNTER — Ambulatory Visit (INDEPENDENT_AMBULATORY_CARE_PROVIDER_SITE_OTHER): Payer: Self-pay

## 2019-04-01 ENCOUNTER — Encounter (HOSPITAL_COMMUNITY): Payer: Self-pay

## 2019-04-01 ENCOUNTER — Emergency Department (HOSPITAL_COMMUNITY)
Admission: EM | Admit: 2019-04-01 | Discharge: 2019-04-01 | Disposition: A | Discharge status: Home / Self Care | Payer: Self-pay | Attending: Emergency Medicine | Admitting: Emergency Medicine

## 2019-04-01 ENCOUNTER — Emergency Department (HOSPITAL_COMMUNITY): Payer: Self-pay

## 2019-04-01 ENCOUNTER — Other Ambulatory Visit: Payer: Self-pay

## 2019-04-01 DIAGNOSIS — F17210 Nicotine dependence, cigarettes, uncomplicated: Secondary | ICD-10-CM | POA: Insufficient documentation

## 2019-04-01 DIAGNOSIS — E109 Type 1 diabetes mellitus without complications: Secondary | ICD-10-CM | POA: Insufficient documentation

## 2019-04-01 DIAGNOSIS — M79675 Pain in left toe(s): Secondary | ICD-10-CM | POA: Insufficient documentation

## 2019-04-01 DIAGNOSIS — R52 Pain, unspecified: Secondary | ICD-10-CM

## 2019-04-01 DIAGNOSIS — Z79899 Other long term (current) drug therapy: Secondary | ICD-10-CM | POA: Insufficient documentation

## 2019-04-01 MED ORDER — NAPROXEN 500 MG PO TABS
500.0000 mg | ORAL_TABLET | Freq: Once | ORAL | Status: AC
  Administered 2019-04-01: 500 mg via ORAL
  Filled 2019-04-01: qty 1

## 2019-04-01 NOTE — ED Triage Notes (Signed)
Pt presents with pain and swelling to her left foot 5th toe. Pt states that she noticed the pain yesterday since yesterday her toe has started bruising and pain has increased. Pt is able to ambulated. Pt does not recall an injury to the toe.

## 2019-04-01 NOTE — ED Provider Notes (Signed)
[facility] DEPT Provider Note   CSN: [id number] Arrival date & time: 04/01/19  0220     History Chief Complaint  Patient presents with  . Toe Pain    Anna Dennis is a 29 y.o. female.   29 year old female with a history of IDDM, hepatitis C, IVDU and heroin abuse, reflux presents to the emergency department for complaints of pain and swelling to her left fifth toe.  States that she had pain yesterday and began to notice increased discoloration and bruising today.  She has taken NSAIDs for symptoms with only slight improvement, though this has made no difference to her color change.  She is unaware of any trauma or injury to the digit.  No fevers, sensation changes, or inability to ambulate.  Denies use of IV drugs on or between her toes.  The history is provided by the patient. No language interpreter was used.  Toe Pain      Past Medical History:  Diagnosis Date  . Depression   . Diabetes mellitus    Insulin pump  . GERD (gastroesophageal reflux disease)   . Hepatitis C   . Heroin abuse ([place])   . Iron deficiency anemia   . IVDU (intravenous drug user)   . OCD (obsessive compulsive disorder)     Patient Active Problem List   Diagnosis Date Noted  . Hyperkalemia 09/05/2015  . Syncope 09/05/2015  . Acute kidney injury (nontraumatic) ([place])   . Heroin abuse ([place])   . 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 Chronic hepatitis C without hepatic coma ([place]) 08/10/2014  . Dehydration 02/20/2012  . Sinus tachycardia 02/20/2012  . Leukocytosis 02/19/2012  . GERD (gastroesophageal reflux disease) 02/19/2012  . DKA (diabetic ketoacidoses) ([place]) 01/19/2012  . Type 1 diabetes mellitus with hyperosmolarity without nonketotic hyperglycemic hyperosmolar coma ([place]) 03/14/2009  . Depression 03/14/2009    History reviewed. No pertinent surgical history.   OB History   No obstetric history on file.     Family History  Problem Relation Age of Onset  .  Bipolar disorder Mother   . Schizophrenia Sister     Social History   Tobacco Use  . Smoking status: Current Every Day Smoker    Types: Cigarettes  . Smokeless tobacco: Never Used  Substance Use Topics  . Alcohol use: Yes    Alcohol/week: 0.0 standard drinks    Comment: occasional  . Drug use: No    Comment: patient states clean since 03/29/14    Home Medications Prior to Admission medications   Medication Sig Start Date End Date Taking? Authorizing Provider  citalopram (CELEXA) 20 MG tablet Take 20 mg by mouth daily.    [provider]  ibuprofen (ADVIL,MOTRIN) 600 MG tablet Take 1 tablet (600 mg total) by mouth every 6 (six) hours as needed. Patient taking differently: Take 600 mg by mouth every 6 (six) hours as needed for mild pain.  07/08/16   Joy, Shawn C, PA-C  insulin detemir (LEVEMIR) 100 UNIT/ML injection Inject 0.32 mLs (32 Units total) into the skin at bedtime. Patient not taking: Reported on 07/25/2016 09/05/15   Robbie Lis, MD  insulin glargine (LANTUS) 100 UNIT/ML injection Inject 32 Units into the skin at bedtime.    [provider]  insulin lispro (HUMALOG) 100 UNIT/ML injection Use 1-15 TID AC as needed Patient taking differently: Inject 1-15 Units into the skin See admin instructions. Give 1 unit for every 5g of carbs, if blood sugar is 125-150=1unit, 151-175=2units, 176-200=3units. Increase  by 1 unit for every 25 increment of blood sugar. 09/05/15   Alison Murray, MD  norgestimate-ethinyl estradiol (ORTHO-CYCLEN,SPRINTEC,PREVIFEM) 0.25-35 MG-MCG tablet Take 1 tablet by mouth daily.    [provider]  insulin aspart (NOVOLOG) 100 UNIT/ML injection Inject 6 Units into the skin 3 (three) times daily with meals. 02/22/12 02/28/12  Richarda Overlie, MD  Omeprazole 20 MG TBEC Take 1 tablet (20 mg total) by mouth daily. 02/28/12 03/04/12  Cleora Fleet, MD    Allergies    Patient has no known allergies.  Review of Systems   Review of Systems    Ten systems reviewed and are negative for acute change, except as noted in the HPI.    Physical Exam Updated Vital Signs BP 133/77 (BP Location: Right Arm)   Pulse 91   Temp 98.1 F (36.7 C) (Oral)   Resp 16   Ht 5\' 7"  (1.702 m)   Wt 78 kg   LMP 02/22/2019   SpO2 98%   BMI 26.94 kg/m   Physical Exam Vitals and nursing note reviewed.  Constitutional:      General: She is not in acute distress.    Appearance: She is well-developed. She is not diaphoretic.     Comments: Nontoxic appearing and in NAD  HENT:     Head: Normocephalic and atraumatic.  Eyes:     General: No scleral icterus.    Conjunctiva/sclera: Conjunctivae normal.  Cardiovascular:     Rate and Rhythm: Normal rate and regular rhythm.     Pulses: Normal pulses.     Comments: DP pulse 2+ in the LLE Pulmonary:     Effort: Pulmonary effort is normal. No respiratory distress.  Musculoskeletal:        General: Normal range of motion.     Cervical back: Normal range of motion.     Comments: Normal ROM of all digits of the L foot. There is ecchymotic discoloration of the L 5th digit which terminates at the base. No significant edema, heat to touch; no lymphangitic streaking. Capillary refill remains brisk in the affected toe.  Skin:    General: Skin is warm and dry.     Coloration: Skin is not pale.     Findings: No erythema or rash.  Neurological:     Mental Status: She is alert and oriented to person, place, and time.     Coordination: Coordination normal.     Comments: Sensation to light touch intact.  Psychiatric:        Behavior: Behavior normal.          ED Results / Procedures / Treatments   Labs (all labs ordered are listed, but only abnormal results are displayed) Labs Reviewed - No data to display  EKG None  Radiology DG Toe 5th Left  Result Date: 04/01/2019 CLINICAL DATA:  Right fifth toe pain common no known injury, initial encounter EXAM: RIGHT FIFTH TOE COMPARISON:  None. FINDINGS:  Mild soft tissue swelling is noted.  No acute fracture is noted. IMPRESSION: No acute abnormality noted. Electronically Signed   By: 06/01/2019 M.D.   On: 04/01/2019 03:29    Procedures Procedures (including critical care time)  Medications Ordered in ED Medications  naproxen (NAPROSYN) tablet 500 mg (500 mg Oral Given 04/01/19 0410)    ED Course  I have reviewed the triage vital signs and the nursing notes.  Pertinent labs & imaging results that were available during my care of the patient were reviewed by  me and considered in my medical decision making (see chart for details).  Clinical Course as of Apr 01 450  Thu Apr 01, 2019  2941 29 year old female with ecchymotic left fifth toe.  No blanching.  Capillary refill remains intact.  No induration or significant edema.  No lymphangitic streaking or heat to touch.  Lower suspicion for infection at this time.  Given history of diabetes and potential for neuropathy, x-ray ordered to assess for traumatic cause of pain.  Will give naproxen.   [KH]  0410 No evidence of fx to digit. Also no remark of subQ air on xray to suggest gas-forming bacterial infection.   [KH]    Clinical Course User Index [KH] Antony Madura, PA-C   MDM Rules/Calculators/A&P                      Patient presents to the emergency department for evaluation of pain to her L 5th toe. Patient neurovascularly intact on exam. Imaging negative for fracture, dislocation, bony deformity. There is ecchymosis to the digit, but preserved capillary refill, no lymphangitic streaking or heat to touch. Suspect contusion. Plan for supportive management; primary care follow up as needed. Return precautions discussed and provided. Patient discharged in stable condition with no unaddressed concerns.   Final Clinical Impression(s) / ED Diagnoses Final diagnoses:  Pain of toe of left foot    Rx / DC Orders ED Discharge Orders    None       Antony Madura, PA-C 04/01/19 0452      Ward, Layla Maw, DO 04/01/19 0501

## 2019-04-01 NOTE — Discharge Instructions (Signed)
We recommend tylenol or ibuprofen for pain. Follow up with your primary care doctor. Return for worsening symptoms such as pallor/paleness of the toe, loss of sensation, worsening redness that streaks up your foot, or fever.

## 2020-05-16 ENCOUNTER — Emergency Department (HOSPITAL_COMMUNITY)
Admission: EM | Admit: 2020-05-16 | Discharge: 2020-05-16 | Disposition: A | Discharge status: Home / Self Care | Payer: Self-pay | Attending: Emergency Medicine | Admitting: Emergency Medicine

## 2020-05-16 ENCOUNTER — Other Ambulatory Visit: Payer: Self-pay

## 2020-05-16 ENCOUNTER — Encounter (HOSPITAL_COMMUNITY): Payer: Self-pay

## 2020-05-16 ENCOUNTER — Emergency Department (HOSPITAL_COMMUNITY): Payer: Self-pay

## 2020-05-16 DIAGNOSIS — L02413 Cutaneous abscess of right upper limb: Secondary | ICD-10-CM | POA: Insufficient documentation

## 2020-05-16 DIAGNOSIS — F1990 Other psychoactive substance use, unspecified, uncomplicated: Secondary | ICD-10-CM | POA: Insufficient documentation

## 2020-05-16 DIAGNOSIS — Z794 Long term (current) use of insulin: Secondary | ICD-10-CM | POA: Insufficient documentation

## 2020-05-16 DIAGNOSIS — F17210 Nicotine dependence, cigarettes, uncomplicated: Secondary | ICD-10-CM | POA: Insufficient documentation

## 2020-05-16 DIAGNOSIS — E1010 Type 1 diabetes mellitus with ketoacidosis without coma: Secondary | ICD-10-CM | POA: Insufficient documentation

## 2020-05-16 DIAGNOSIS — L0291 Cutaneous abscess, unspecified: Secondary | ICD-10-CM

## 2020-05-16 DIAGNOSIS — L02519 Cutaneous abscess of unspecified hand: Secondary | ICD-10-CM

## 2020-05-16 MED ORDER — DOXYCYCLINE HYCLATE 100 MG PO CAPS: 100.0000 mg | ORAL_CAPSULE | Freq: Two times a day (BID) | ORAL | 0 refills | Status: DC

## 2020-05-16 MED ORDER — DOXYCYCLINE HYCLATE 100 MG PO TABS
100.0000 mg | ORAL_TABLET | Freq: Once | ORAL | Status: AC
  Administered 2020-05-16: 100 mg via ORAL
  Filled 2020-05-16: qty 1

## 2020-05-16 MED ORDER — BUPIVACAINE HCL (PF) 0.5 % IJ SOLN
20.0000 mL | Freq: Once | INTRAMUSCULAR | Status: AC
  Administered 2020-05-16: 20 mL
  Filled 2020-05-16: qty 30

## 2020-05-16 MED ORDER — IBUPROFEN 800 MG PO TABS
800.0000 mg | ORAL_TABLET | Freq: Once | ORAL | Status: AC
  Administered 2020-05-16: 800 mg via ORAL
  Filled 2020-05-16: qty 1

## 2020-05-16 NOTE — ED Provider Notes (Signed)
[facility]-EMERGENCY DEPT Provider Note   CSN: [id number] Arrival date & time: 05/16/20  0436     History Chief Complaint  Patient presents with  . Abscess    Anna Dennis is a 30 y.o. female.  HPI Patient 30 year old female past medical history significant for hepatitis C, heroin use, DM, depression  Patient is presented today with right hand pain she states is gotten increasingly worse over the past 7 days.  She states it is achy constant worse with movement and touch.  She states is swollen red and painful.  She states that she injected heroin into her pain in her hand approximately 1 week ago.  She has had similar abscesses in the past however these seem to have resolved with warm compresses sometime this 1 has progressively gotten worse.  She denies any fevers or chills.  No other associate symptoms  She is taking no medications prior to arrival     Past Medical History:  Diagnosis Date  . Depression   . Diabetes mellitus    Insulin pump  . GERD (gastroesophageal reflux disease)   . Hepatitis C   . Heroin abuse ([facility])   . Iron deficiency anemia   . IVDU (intravenous drug user)   . OCD (obsessive compulsive disorder)     Patient Active Problem List   Diagnosis Date Noted  . Hyperkalemia 09/05/2015  . Syncope 09/05/2015  . Acute kidney injury (nontraumatic) ([facility])   . Heroin abuse ([facility])   . 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 Dehydration 02/20/2012  . Sinus tachycardia 02/20/2012  . Leukocytosis 02/19/2012  . GERD (gastroesophageal reflux disease) 02/19/2012  . DKA (diabetic ketoacidoses) ([facility]) 01/19/2012  . 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 Depression 03/14/2009    History reviewed. No pertinent surgical history.   OB History   No obstetric history on file.     Family History  Problem Relation  Age of Onset  . Bipolar disorder Mother   . Schizophrenia Sister     Social History   Tobacco Use  . Smoking status: Current Every Day Smoker    Types: Cigarettes  . Smokeless tobacco: Never Used  Substance Use Topics  . Alcohol use: Yes    Alcohol/week: 0.0 standard drinks    Comment: occasional  . Drug use: Yes    Comment: patient states clean since 03/29/14    Home Medications Prior to Admission medications   Medication Sig Start Date End Date Taking? Authorizing Provider  citalopram (CELEXA) 20 MG tablet Take 20 mg by mouth daily.   Yes [provider]  doxycycline (VIBRAMYCIN) 100 MG capsule Take 1 capsule (100 mg total) by mouth 2 (two) times daily. 05/16/20  Yes Paige Vanderwoude S, PA  ferrous sulfate 324 MG TBEC Take 324 mg by mouth daily with breakfast.   Yes [provider]  ibuprofen (ADVIL,MOTRIN) 600 MG tablet Take 1 tablet (600 mg total) by mouth every 6 (six) hours as needed. Patient taking differently: Take 600 mg by mouth every 6 (six) hours as needed for mild pain. 07/08/16  Yes Joy, Shawn C, PA-C  insulin glargine (LANTUS) 100 UNIT/ML injection Inject 50 Units into the skin at bedtime.   Yes [provider]  insulin lispro (HUMALOG) 100 UNIT/ML injection Use 1-15 TID AC as needed Patient taking differently: Inject 1-15 Units into the skin See admin instructions. Give 1 unit  for every 5g of carbs, if blood sugar is 125-150=1unit, 151-175=2units, 176-200=3units. Increase by 1 unit for every 25 increment of blood sugar. 09/05/15  Yes Alison Murray, MD  medroxyPROGESTERone (DEPO-PROVERA) 150 MG/ML injection Inject 150 mg into the muscle every 3 (three) months.   Yes [provider]  TURMERIC CURCUMIN PO Take 1 capsule by mouth daily.   Yes [provider]  insulin aspart (NOVOLOG) 100 UNIT/ML injection Inject 6 Units into the skin 3 (three) times daily with meals. 02/22/12 02/28/12  Richarda Overlie, MD  Omeprazole 20 MG TBEC Take 1  tablet (20 mg total) by mouth daily. 02/28/12 03/04/12  Cleora Fleet, MD    Allergies    Patient has no known allergies.  Review of Systems   Review of Systems  Constitutional: Negative for fever.  HENT: Negative for congestion.   Respiratory: Negative for shortness of breath.   Cardiovascular: Negative for chest pain.  Gastrointestinal: Negative for abdominal distention.  Skin: Positive for wound.       Abscess  Neurological: Negative for dizziness and headaches.    Physical Exam Updated Vital Signs BP (!) 147/98 (BP Location: Left Arm)   Pulse 95   Temp 98.5 F (36.9 C)   Resp 20   Ht 5\' 7"  (1.702 m)   Wt 83.9 kg   LMP  (LMP Unknown)   SpO2 100%   BMI 28.98 kg/m   Physical Exam        ED Results / Procedures / Treatments   Labs (all labs ordered are listed, but only abnormal results are displayed) Labs Reviewed - No data to display  EKG None  Radiology DG Hand Complete Right  Result Date: 05/16/2020 CLINICAL DATA:  Abscess.  Concern for foreign body (needle). EXAM: RIGHT HAND - COMPLETE 3+ VIEW COMPARISON:  No prior. FINDINGS: Diffuse soft tissue swelling. No radiopaque foreign body. No acute bony or joint abnormality identified. IMPRESSION: Diffuse prominent soft tissue swelling. No radiopaque foreign body. No acute bony abnormality. Electronically Signed   By: 05/18/2020  Register   On: 05/16/2020 05:28    Procedures .04/21/2022Incision and Drainage  Date/Time: 05/16/2020 6:15 AM Performed by: 05/18/2020, PA Authorized by: Gailen Shelter, PA   Consent:    Consent obtained:  Verbal   Consent given by:  Patient   Risks discussed:  Bleeding, incomplete drainage, pain and damage to other organs   Alternatives discussed:  No treatment Universal protocol:    Procedure explained and questions answered to patient or proxy's satisfaction: yes     Relevant documents present and verified: yes     Test results available : yes     Imaging studies available:  yes     Required blood products, implants, devices, and special equipment available: yes     Site/side marked: yes     Immediately prior to procedure, a time out was called: yes     Patient identity confirmed:  Verbally with patient and arm band Location:    Type:  Abscess   Size:  2x2 cm   Location:  Upper extremity   Upper extremity location:  Hand   Hand location:  R hand Pre-procedure details:    Skin preparation:  Chlorhexidine Sedation:    Sedation type:  None Anesthesia:    Anesthesia method:  Local infiltration   Local anesthetic:  Bupivacaine 0.5% w/o epi Procedure type:    Complexity:  Complex Procedure details:    Ultrasound guidance: no  Needle aspiration: no     Incision types:  Single straight   Incision depth:  Subcutaneous   Wound management:  Probed and deloculated, irrigated with saline and extensive cleaning   Drainage:  Purulent   Drainage amount:  Moderate   Wound treatment:  Wound left open   Packing material: one small 2x2 gauze square. Post-procedure details:    Procedure completion:  Tolerated well, no immediate complications     Medications Ordered in ED Medications  bupivacaine (MARCAINE) 0.5 % injection 20 mL (20 mLs Infiltration Given 05/16/20 0557)  ibuprofen (ADVIL) tablet 800 mg (800 mg Oral Given 05/16/20 0556)  doxycycline (VIBRA-TABS) tablet 100 mg (100 mg Oral Given 05/16/20 0556)    ED Course  I have reviewed the triage vital signs and the nursing notes.  Pertinent labs & imaging results that were available during my care of the patient were reviewed by me and considered in my medical decision making (see chart for details).    MDM Rules/Calculators/A&P                          Patient is 29 year old IV drug user presenting today for right hand pain there is obvious abscess on examination patient has good movement of fingers cannot make a complete fist due to the swelling however.  Her abscesses position between the first and  second digit in the interdigital space.  She has no tenderness over the flexor surface of any fingers.  She has good sensation and movement of her fingers.  Good cap refill.  She is afebrile with no tachycardia.  I&D successful.  DNVI after incision and drainage.   Patient given antibiotics doxycycline she will take this and follow-up with PCP as well as hand surgeon.  She will return to ER in 48--72 hours.   Patient is a has importance of following up with hand surgery.  All questions answered to the best my ability.  I did discuss this patient with my attending physician prior to incision and drainage given location of abscess.  Patient also is requesting incision and drainage.  We had a shared decision-making conversation given that this is a sensitive area.  She was offered specialist referral but states that she preferred to have incision and drainage done here in ER.  Patient is dressed with bandage by RN.  Final Clinical Impression(s) / ED Diagnoses Final diagnoses:  Abscess  Hand abscess  IVDU (intravenous drug user)    Rx / DC Orders ED Discharge Orders         Ordered    doxycycline (VIBRAMYCIN) 100 MG capsule  2 times daily        05/16/20 0614           Gailen Shelter, PA 05/16/20 4818    Shon Baton, MD 05/16/20 [phone]

## 2020-05-16 NOTE — ED Notes (Signed)
Hand wrapped in nonadhesive dressing and guaze.

## 2020-05-16 NOTE — ED Notes (Signed)
Materials at bedside.

## 2020-05-16 NOTE — Discharge Instructions (Addendum)
You have an abscess that was incised and drained today.  Please take the antibiotic as prescribed for the next 10d.  Please follow-up with a hand surgeon.  Please call [facility] to schedule appointment with one of our hand surgeons.  This is a crucial step in improving the abscess that you have.  Please keep the area clean and bandaged.  You may also remove the bandage and do warm water soaks.  You may wash with warm soapy water.  I recommend return to the ER in 48-72 hours for wound check.

## 2020-05-16 NOTE — ED Notes (Signed)
Patient was educated on contacting follow up Dr today and advised on wound care dressing. Patient was also advised to come back to the ED on Thursday for a wound check

## 2020-05-16 NOTE — ED Triage Notes (Signed)
Pt reports abscess on right hand from iv drug use. Says hand has been swelling up for the last week.

## 2020-06-15 ENCOUNTER — Telehealth: Payer: Self-pay

## 2020-06-15 NOTE — Telephone Encounter (Signed)
OUD Intake:  Drug of choice: Heroin  How long: "On and off for 10 years."  Other substances: "Weed, sometimes xanax or cocaine, but not often."  Currently on suboxone: No  Interested in treatment: Yes  Around users.: No  Social support: Yes, mother, and boyfriend   Other: Has Type 1 DM and Hep C  Will forward to OUD Attending Pool for consideration of OUD appt. Kinnie Feil, BSN, RN-BC

## 2020-06-15 NOTE — Telephone Encounter (Signed)
Requesting new pt appt for OUD clinic. Please call pt back.

## 2020-06-20 NOTE — Telephone Encounter (Signed)
Sounds appropriate for suboxone. Ok to schedule next available OUD appointment.

## 2020-06-20 NOTE — Telephone Encounter (Signed)
Returned call to patient. No answer. Left message on VM requesting return call.  °

## 2020-07-11 ENCOUNTER — Inpatient Hospital Stay (HOSPITAL_COMMUNITY)
Admission: EM | Admit: 2020-07-11 | Discharge: 2020-07-14 | DRG: 853 | Discharge status: Against Medical Advice | Payer: Self-pay | Attending: Internal Medicine | Admitting: Internal Medicine

## 2020-07-11 ENCOUNTER — Inpatient Hospital Stay (HOSPITAL_COMMUNITY): Payer: Self-pay

## 2020-07-11 ENCOUNTER — Inpatient Hospital Stay (HOSPITAL_COMMUNITY): Payer: Self-pay | Admitting: Anesthesiology

## 2020-07-11 ENCOUNTER — Emergency Department (HOSPITAL_COMMUNITY): Payer: Self-pay

## 2020-07-11 ENCOUNTER — Other Ambulatory Visit (HOSPITAL_COMMUNITY): Payer: Self-pay

## 2020-07-11 ENCOUNTER — Other Ambulatory Visit: Payer: Self-pay

## 2020-07-11 ENCOUNTER — Encounter (HOSPITAL_COMMUNITY)
Admission: EM | Discharge status: Against Medical Advice | Payer: Self-pay | Source: Home / Self Care | Attending: Internal Medicine

## 2020-07-11 DIAGNOSIS — A419 Sepsis, unspecified organism: Principal | ICD-10-CM | POA: Diagnosis present

## 2020-07-11 DIAGNOSIS — G5630 Lesion of radial nerve, unspecified upper limb: Secondary | ICD-10-CM | POA: Diagnosis present

## 2020-07-11 DIAGNOSIS — R0603 Acute respiratory distress: Secondary | ICD-10-CM | POA: Diagnosis present

## 2020-07-11 DIAGNOSIS — R7881 Bacteremia: Secondary | ICD-10-CM

## 2020-07-11 DIAGNOSIS — F1110 Opioid abuse, uncomplicated: Secondary | ICD-10-CM | POA: Diagnosis present

## 2020-07-11 DIAGNOSIS — M726 Necrotizing fasciitis: Secondary | ICD-10-CM | POA: Diagnosis present

## 2020-07-11 DIAGNOSIS — L03114 Cellulitis of left upper limb: Secondary | ICD-10-CM | POA: Diagnosis present

## 2020-07-11 DIAGNOSIS — L02414 Cutaneous abscess of left upper limb: Secondary | ICD-10-CM | POA: Diagnosis present

## 2020-07-11 DIAGNOSIS — Z794 Long term (current) use of insulin: Secondary | ICD-10-CM

## 2020-07-11 DIAGNOSIS — E860 Dehydration: Secondary | ICD-10-CM | POA: Diagnosis present

## 2020-07-11 DIAGNOSIS — X58XXXA Exposure to other specified factors, initial encounter: Secondary | ICD-10-CM | POA: Diagnosis present

## 2020-07-11 DIAGNOSIS — E1010 Type 1 diabetes mellitus with ketoacidosis without coma: Secondary | ICD-10-CM | POA: Diagnosis present

## 2020-07-11 DIAGNOSIS — Z79899 Other long term (current) drug therapy: Secondary | ICD-10-CM

## 2020-07-11 DIAGNOSIS — F17210 Nicotine dependence, cigarettes, uncomplicated: Secondary | ICD-10-CM | POA: Diagnosis present

## 2020-07-11 DIAGNOSIS — T383X6A Underdosing of insulin and oral hypoglycemic [antidiabetic] drugs, initial encounter: Secondary | ICD-10-CM | POA: Diagnosis present

## 2020-07-11 DIAGNOSIS — Z20822 Contact with and (suspected) exposure to covid-19: Secondary | ICD-10-CM | POA: Diagnosis present

## 2020-07-11 DIAGNOSIS — K219 Gastro-esophageal reflux disease without esophagitis: Secondary | ICD-10-CM | POA: Diagnosis present

## 2020-07-11 DIAGNOSIS — D509 Iron deficiency anemia, unspecified: Secondary | ICD-10-CM | POA: Diagnosis present

## 2020-07-11 DIAGNOSIS — E876 Hypokalemia: Secondary | ICD-10-CM | POA: Diagnosis present

## 2020-07-11 DIAGNOSIS — S41142A Puncture wound with foreign body of left upper arm, initial encounter: Secondary | ICD-10-CM | POA: Diagnosis present

## 2020-07-11 DIAGNOSIS — Z91138 Patient's unintentional underdosing of medication regimen for other reason: Secondary | ICD-10-CM

## 2020-07-11 DIAGNOSIS — F32A Depression, unspecified: Secondary | ICD-10-CM | POA: Diagnosis present

## 2020-07-11 HISTORY — PX: I & D EXTREMITY: SHX5045

## 2020-07-11 LAB — CBC
HCT: 30.6 % — ABNORMAL LOW (ref 36.0–46.0)
Hemoglobin: 9.9 g/dL — ABNORMAL LOW (ref 12.0–15.0)
MCH: 28 pg (ref 26.0–34.0)
MCHC: 32.4 g/dL (ref 30.0–36.0)
MCV: 86.4 fL (ref 80.0–100.0)
Platelets: UNDETERMINED 10*3/uL (ref 150–400)
RBC: 3.54 MIL/uL — ABNORMAL LOW (ref 3.87–5.11)
RDW: 14.2 % (ref 11.5–15.5)
WBC: 34 10*3/uL — ABNORMAL HIGH (ref 4.0–10.5)
nRBC: 0 % (ref 0.0–0.2)

## 2020-07-11 LAB — LACTIC ACID, PLASMA
Lactic Acid, Venous: 3.2 mmol/L (ref 0.5–1.9)
Lactic Acid, Venous: 2.5 mmol/L (ref 0.5–1.9)

## 2020-07-11 LAB — I-STAT BETA HCG BLOOD, ED (MC, WL, AP ONLY): I-stat hCG, quantitative: <5 m[IU]/mL (ref <5)

## 2020-07-11 LAB — BASIC METABOLIC PANEL
Anion gap: 15 (ref 5–15)
Anion gap: 7 (ref 5–15)
Anion gap: 8 (ref 5–15)
BUN: 28 mg/dL — ABNORMAL HIGH (ref 6–20)
BUN: 24 mg/dL — ABNORMAL HIGH (ref 6–20)
BUN: 21 mg/dL — ABNORMAL HIGH (ref 6–20)
CO2: 12 mmol/L — ABNORMAL LOW (ref 22–32)
CO2: 17 mmol/L — ABNORMAL LOW (ref 22–32)
CO2: 18 mmol/L — ABNORMAL LOW (ref 22–32)
Calcium: 8.4 mg/dL — ABNORMAL LOW (ref 8.9–10.3)
Calcium: 8.3 mg/dL — ABNORMAL LOW (ref 8.9–10.3)
Calcium: 8 mg/dL — ABNORMAL LOW (ref 8.9–10.3)
Chloride: 105 mmol/L (ref 98–111)
Chloride: 106 mmol/L (ref 98–111)
Chloride: 106 mmol/L (ref 98–111)
Creatinine, Ser: 1.19 mg/dL — ABNORMAL HIGH (ref 0.44–1.00)
Creatinine, Ser: 0.88 mg/dL (ref 0.44–1.00)
Creatinine, Ser: 0.76 mg/dL (ref 0.44–1.00)
GFR, Estimated: >60 mL/min (ref >60)
GFR, Estimated: >60 mL/min (ref >60)
GFR, Estimated: >60 mL/min (ref >60)
Glucose, Bld: 247 mg/dL — ABNORMAL HIGH (ref 70–99)
Glucose, Bld: 199 mg/dL — ABNORMAL HIGH (ref 70–99)
Glucose, Bld: 206 mg/dL — ABNORMAL HIGH (ref 70–99)
Potassium: 2.3 mmol/L — CL (ref 3.5–5.1)
Potassium: 2.9 mmol/L — ABNORMAL LOW (ref 3.5–5.1)
Potassium: 2.9 mmol/L — ABNORMAL LOW (ref 3.5–5.1)
Sodium: 132 mmol/L — ABNORMAL LOW (ref 135–145)
Sodium: 130 mmol/L — ABNORMAL LOW (ref 135–145)
Sodium: 132 mmol/L — ABNORMAL LOW (ref 135–145)

## 2020-07-11 LAB — CBC WITH DIFFERENTIAL/PLATELET
Abs Immature Granulocytes: 2.2 10*3/uL — ABNORMAL HIGH (ref 0.00–0.07)
Basophils Absolute: 0 10*3/uL (ref 0.0–0.1)
Basophils Relative: 0 %
Eosinophils Absolute: 0 10*3/uL (ref 0.0–0.5)
Eosinophils Relative: 0 %
HCT: 35.9 % — ABNORMAL LOW (ref 36.0–46.0)
Hemoglobin: 10.9 g/dL — ABNORMAL LOW (ref 12.0–15.0)
Immature Granulocytes: 4 %
Lymphocytes Relative: 4 %
Lymphs Abs: 2.1 10*3/uL (ref 0.7–4.0)
MCH: 28.2 pg (ref 26.0–34.0)
MCHC: 30.4 g/dL (ref 30.0–36.0)
MCV: 93 fL (ref 80.0–100.0)
Monocytes Absolute: 3.2 10*3/uL — ABNORMAL HIGH (ref 0.1–1.0)
Monocytes Relative: 6 %
Neutro Abs: 46.3 10*3/uL — ABNORMAL HIGH (ref 1.7–7.7)
Neutrophils Relative %: 86 %
Platelets: 579 10*3/uL — ABNORMAL HIGH (ref 150–400)
RBC: 3.86 MIL/uL — ABNORMAL LOW (ref 3.87–5.11)
RDW: 14.6 % (ref 11.5–15.5)
WBC Morphology: INCREASED
WBC: 53.8 10*3/uL (ref 4.0–10.5)
nRBC: 0 % (ref 0.0–0.2)

## 2020-07-11 LAB — POCT I-STAT, CHEM 8
BUN: 27 mg/dL — ABNORMAL HIGH (ref 6–20)
BUN: 26 mg/dL — ABNORMAL HIGH (ref 6–20)
Calcium, Ion: 1.31 mmol/L (ref 1.15–1.40)
Calcium, Ion: 1.34 mmol/L (ref 1.15–1.40)
Chloride: 103 mmol/L (ref 98–111)
Chloride: 104 mmol/L (ref 98–111)
Creatinine, Ser: 0.7 mg/dL (ref 0.44–1.00)
Creatinine, Ser: 0.8 mg/dL (ref 0.44–1.00)
Glucose, Bld: 501 mg/dL (ref 70–99)
Glucose, Bld: 405 mg/dL — ABNORMAL HIGH (ref 70–99)
HCT: 37 % (ref 36.0–46.0)
HCT: 34 % — ABNORMAL LOW (ref 36.0–46.0)
Hemoglobin: 12.6 g/dL (ref 12.0–15.0)
Hemoglobin: 11.6 g/dL — ABNORMAL LOW (ref 12.0–15.0)
Potassium: 3.1 mmol/L — ABNORMAL LOW (ref 3.5–5.1)
Potassium: 2.6 mmol/L — CL (ref 3.5–5.1)
Sodium: 130 mmol/L — ABNORMAL LOW (ref 135–145)
Sodium: 131 mmol/L — ABNORMAL LOW (ref 135–145)
TCO2: 6 mmol/L — ABNORMAL LOW (ref 22–32)
TCO2: 9 mmol/L — ABNORMAL LOW (ref 22–32)

## 2020-07-11 LAB — GLUCOSE, CAPILLARY
Glucose-Capillary: 358 mg/dL — ABNORMAL HIGH (ref 70–99)
Glucose-Capillary: 340 mg/dL — ABNORMAL HIGH (ref 70–99)
Glucose-Capillary: 308 mg/dL — ABNORMAL HIGH (ref 70–99)
Glucose-Capillary: 282 mg/dL — ABNORMAL HIGH (ref 70–99)
Glucose-Capillary: 216 mg/dL — ABNORMAL HIGH (ref 70–99)
Glucose-Capillary: 212 mg/dL — ABNORMAL HIGH (ref 70–99)
Glucose-Capillary: 187 mg/dL — ABNORMAL HIGH (ref 70–99)
Glucose-Capillary: 192 mg/dL — ABNORMAL HIGH (ref 70–99)
Glucose-Capillary: 176 mg/dL — ABNORMAL HIGH (ref 70–99)
Glucose-Capillary: 164 mg/dL — ABNORMAL HIGH (ref 70–99)
Glucose-Capillary: 165 mg/dL — ABNORMAL HIGH (ref 70–99)
Glucose-Capillary: 214 mg/dL — ABNORMAL HIGH (ref 70–99)
Glucose-Capillary: 213 mg/dL — ABNORMAL HIGH (ref 70–99)
Glucose-Capillary: 226 mg/dL — ABNORMAL HIGH (ref 70–99)
Glucose-Capillary: 198 mg/dL — ABNORMAL HIGH (ref 70–99)
Glucose-Capillary: 185 mg/dL — ABNORMAL HIGH (ref 70–99)

## 2020-07-11 LAB — COMPREHENSIVE METABOLIC PANEL
ALT: 12 U/L (ref 0–44)
AST: 14 U/L — ABNORMAL LOW (ref 15–41)
Albumin: 2.6 g/dL — ABNORMAL LOW (ref 3.5–5.0)
Alkaline Phosphatase: 166 U/L — ABNORMAL HIGH (ref 38–126)
BUN: 28 mg/dL — ABNORMAL HIGH (ref 6–20)
CO2: <7 mmol/L — ABNORMAL LOW (ref 22–32)
Calcium: 8.5 mg/dL — ABNORMAL LOW (ref 8.9–10.3)
Chloride: 94 mmol/L — ABNORMAL LOW (ref 98–111)
Creatinine, Ser: 1.68 mg/dL — ABNORMAL HIGH (ref 0.44–1.00)
GFR, Estimated: 41 mL/min — ABNORMAL LOW (ref >60)
Glucose, Bld: 594 mg/dL (ref 70–99)
Potassium: 3.7 mmol/L (ref 3.5–5.1)
Sodium: 126 mmol/L — ABNORMAL LOW (ref 135–145)
Total Bilirubin: 1.8 mg/dL — ABNORMAL HIGH (ref 0.3–1.2)
Total Protein: 6.8 g/dL (ref 6.5–8.1)

## 2020-07-11 LAB — PHOSPHORUS
Phosphorus: 4.1 mg/dL (ref 2.5–4.6)
Phosphorus: <1 mg/dL — CL (ref 2.5–4.6)
Phosphorus: 1.7 mg/dL — ABNORMAL LOW (ref 2.5–4.6)

## 2020-07-11 LAB — APTT: aPTT: 25 seconds (ref 24–36)

## 2020-07-11 LAB — CBG MONITORING, ED
Glucose-Capillary: 598 mg/dL (ref 70–99)
Glucose-Capillary: 539 mg/dL (ref 70–99)

## 2020-07-11 LAB — MRSA NEXT GEN BY PCR, NASAL: MRSA by PCR Next Gen: NOT DETECTED

## 2020-07-11 LAB — ECHOCARDIOGRAM COMPLETE
AR max vel: 1.77 cm2
AV Area VTI: 1.84 cm2
AV Area mean vel: 1.67 cm2
AV Mean grad: 10 mmHg
AV Peak grad: 19.2 mmHg
Ao pk vel: 2.19 m/s
Area-P 1/2: 4.1 cm2
Height: 67 in
S' Lateral: 2.6 cm
Weight: 2962.98 oz

## 2020-07-11 LAB — MAGNESIUM
Magnesium: 1.9 mg/dL (ref 1.7–2.4)
Magnesium: 1.5 mg/dL — ABNORMAL LOW (ref 1.7–2.4)

## 2020-07-11 LAB — RESP PANEL BY RT-PCR (FLU A&B, COVID) ARPGX2
Influenza A by PCR: NEGATIVE
Influenza B by PCR: NEGATIVE
SARS Coronavirus 2 by RT PCR: NEGATIVE

## 2020-07-11 LAB — HIV ANTIBODY (ROUTINE TESTING W REFLEX): HIV Screen 4th Generation wRfx: NONREACTIVE

## 2020-07-11 LAB — PROTIME-INR
INR: 1.4 — ABNORMAL HIGH (ref 0.8–1.2)
Prothrombin Time: 17.1 seconds — ABNORMAL HIGH (ref 11.4–15.2)

## 2020-07-11 LAB — BETA-HYDROXYBUTYRIC ACID
Beta-Hydroxybutyric Acid: >8 mmol/L — ABNORMAL HIGH (ref 0.05–0.27)
Beta-Hydroxybutyric Acid: 3.61 mmol/L — ABNORMAL HIGH (ref 0.05–0.27)

## 2020-07-11 LAB — ETHANOL: Alcohol, Ethyl (B): <10 mg/dL (ref <10)

## 2020-07-11 SURGERY — IRRIGATION AND DEBRIDEMENT EXTREMITY
Anesthesia: General | Site: Arm Upper | Laterality: Left

## 2020-07-11 MED ORDER — VANCOMYCIN HCL 1500 MG/300ML IV SOLN
1500.0000 mg | Freq: Once | INTRAVENOUS | Status: AC
  Administered 2020-07-11: 1500 mg via INTRAVENOUS
  Filled 2020-07-11: qty 300

## 2020-07-11 MED ORDER — INSULIN REGULAR(HUMAN) IN NACL 100-0.9 UT/100ML-% IV SOLN
INTRAVENOUS | Status: DC
  Administered 2020-07-11: 11.5 [IU]/h via INTRAVENOUS
  Administered 2020-07-11: 12 [IU]/h via INTRAVENOUS
  Administered 2020-07-11: 9.5 [IU]/h via INTRAVENOUS
  Administered 2020-07-11: 10.5 [IU]/h via INTRAVENOUS
  Administered 2020-07-12: 4.6 [IU]/h via INTRAVENOUS
  Filled 2020-07-11 (×4): qty 100

## 2020-07-11 MED ORDER — ARTIFICIAL TEARS OPHTHALMIC OINT
TOPICAL_OINTMENT | OPHTHALMIC | Status: AC
  Filled 2020-07-11: qty 3.5

## 2020-07-11 MED ORDER — PIPERACILLIN-TAZOBACTAM 3.375 G IVPB 30 MIN
3.3750 g | Freq: Once | INTRAVENOUS | Status: AC
  Administered 2020-07-11: 3.375 g via INTRAVENOUS
  Filled 2020-07-11: qty 50

## 2020-07-11 MED ORDER — PIPERACILLIN-TAZOBACTAM 3.375 G IVPB
3.3750 g | Freq: Three times a day (TID) | INTRAVENOUS | Status: DC
  Administered 2020-07-11 – 2020-07-14 (×9): 3.375 g via INTRAVENOUS
  Filled 2020-07-11 (×10): qty 50

## 2020-07-11 MED ORDER — VANCOMYCIN HCL 1000 MG IV SOLR
INTRAVENOUS | Status: AC
  Filled 2020-07-11: qty 1000

## 2020-07-11 MED ORDER — MIDAZOLAM HCL 2 MG/2ML IJ SOLN
INTRAMUSCULAR | Status: DC | PRN
  Administered 2020-07-11: 2 mg via INTRAVENOUS

## 2020-07-11 MED ORDER — SODIUM CHLORIDE 0.9 % IR SOLN
Status: DC | PRN
  Administered 2020-07-11: 3000 mL
  Administered 2020-07-11: 6000 mL
  Administered 2020-07-11: 3000 mL

## 2020-07-11 MED ORDER — POLYETHYLENE GLYCOL 3350 17 G PO PACK: 17.0000 g | PACK | Freq: Every day | ORAL | Status: DC | PRN

## 2020-07-11 MED ORDER — CLINDAMYCIN PHOSPHATE 600 MG/50ML IV SOLN
600.0000 mg | Freq: Three times a day (TID) | INTRAVENOUS | Status: DC
  Administered 2020-07-11 – 2020-07-14 (×9): 600 mg via INTRAVENOUS
  Filled 2020-07-11 (×9): qty 50

## 2020-07-11 MED ORDER — POTASSIUM PHOSPHATES 15 MMOLE/5ML IV SOLN
45.0000 mmol | Freq: Once | INTRAVENOUS | Status: AC
  Administered 2020-07-11: 45 mmol via INTRAVENOUS
  Filled 2020-07-11: qty 15

## 2020-07-11 MED ORDER — HYDROCODONE-ACETAMINOPHEN 5-325 MG PO TABS
1.0000 | ORAL_TABLET | Freq: Four times a day (QID) | ORAL | Status: DC | PRN
Start: 2020-07-11 — End: 2020-07-14
  Administered 2020-07-13 – 2020-07-14 (×5): 1 via ORAL
  Filled 2020-07-11 (×5): qty 1

## 2020-07-11 MED ORDER — POTASSIUM CHLORIDE 10 MEQ/100ML IV SOLN
10.0000 meq | INTRAVENOUS | Status: AC
  Administered 2020-07-11 (×3): 10 meq via INTRAVENOUS
  Filled 2020-07-11 (×3): qty 100

## 2020-07-11 MED ORDER — MAGNESIUM SULFATE 2 GM/50ML IV SOLN
2.0000 g | Freq: Once | INTRAVENOUS | Status: AC
  Administered 2020-07-11: 2 g via INTRAVENOUS
  Filled 2020-07-11: qty 50

## 2020-07-11 MED ORDER — LACTATED RINGERS IV SOLN: INTRAVENOUS | Status: DC

## 2020-07-11 MED ORDER — DEXMEDETOMIDINE (PRECEDEX) IN NS 20 MCG/5ML (4 MCG/ML) IV SYRINGE
PREFILLED_SYRINGE | INTRAVENOUS | Status: AC
  Filled 2020-07-11: qty 5

## 2020-07-11 MED ORDER — POTASSIUM CHLORIDE 10 MEQ/100ML IV SOLN
10.0000 meq | INTRAVENOUS | Status: DC
Start: 2020-07-11 — End: 2020-07-11
  Administered 2020-07-11: 10 meq via INTRAVENOUS
  Filled 2020-07-11: qty 100

## 2020-07-11 MED ORDER — LACTATED RINGERS IV BOLUS (SEPSIS)
1000.0000 mL | Freq: Once | INTRAVENOUS | Status: AC
  Administered 2020-07-11: 1000 mL via INTRAVENOUS

## 2020-07-11 MED ORDER — MIDAZOLAM HCL 2 MG/2ML IJ SOLN
INTRAMUSCULAR | Status: AC
  Filled 2020-07-11: qty 2

## 2020-07-11 MED ORDER — VANCOMYCIN HCL 1000 MG IV SOLR
INTRAVENOUS | Status: DC | PRN
  Administered 2020-07-11: 1000 mg

## 2020-07-11 MED ORDER — DOCUSATE SODIUM 100 MG PO CAPS: 100.0000 mg | ORAL_CAPSULE | Freq: Two times a day (BID) | ORAL | Status: DC | PRN

## 2020-07-11 MED ORDER — SUCCINYLCHOLINE CHLORIDE 200 MG/10ML IV SOSY
PREFILLED_SYRINGE | INTRAVENOUS | Status: AC
  Filled 2020-07-11: qty 10

## 2020-07-11 MED ORDER — LACTATED RINGERS IV SOLN: INTRAVENOUS | Status: DC | PRN

## 2020-07-11 MED ORDER — VANCOMYCIN VARIABLE DOSE PER UNSTABLE RENAL FUNCTION (PHARMACIST DOSING): Status: DC

## 2020-07-11 MED ORDER — HEPARIN SODIUM (PORCINE) 5000 UNIT/ML IJ SOLN
5000.0000 [IU] | Freq: Three times a day (TID) | INTRAMUSCULAR | Status: DC
  Administered 2020-07-11 – 2020-07-14 (×9): 5000 [IU] via SUBCUTANEOUS
  Filled 2020-07-11 (×9): qty 1

## 2020-07-11 MED ORDER — PROPOFOL 10 MG/ML IV BOLUS
INTRAVENOUS | Status: AC
  Filled 2020-07-11: qty 40

## 2020-07-11 MED ORDER — PHENYLEPHRINE 40 MCG/ML (10ML) SYRINGE FOR IV PUSH (FOR BLOOD PRESSURE SUPPORT)
PREFILLED_SYRINGE | INTRAVENOUS | Status: AC
  Filled 2020-07-11: qty 10

## 2020-07-11 MED ORDER — ONDANSETRON HCL 4 MG/2ML IJ SOLN
INTRAMUSCULAR | Status: AC
  Filled 2020-07-11: qty 2

## 2020-07-11 MED ORDER — POTASSIUM CHLORIDE CRYS ER 20 MEQ PO TBCR: 40.0000 meq | EXTENDED_RELEASE_TABLET | Freq: Two times a day (BID) | ORAL | Status: DC

## 2020-07-11 MED ORDER — DEXMEDETOMIDINE HCL IN NACL 200 MCG/50ML IV SOLN
INTRAVENOUS | Status: DC | PRN
  Administered 2020-07-11: 4 ug via INTRAVENOUS
  Administered 2020-07-11 (×2): 8 ug via INTRAVENOUS

## 2020-07-11 MED ORDER — SUCCINYLCHOLINE CHLORIDE 200 MG/10ML IV SOSY
PREFILLED_SYRINGE | INTRAVENOUS | Status: DC | PRN
  Administered 2020-07-11: 120 mg via INTRAVENOUS

## 2020-07-11 MED ORDER — LIDOCAINE HCL (PF) 2 % IJ SOLN
INTRAMUSCULAR | Status: AC
  Filled 2020-07-11: qty 5

## 2020-07-11 MED ORDER — EPHEDRINE 5 MG/ML INJ
INTRAVENOUS | Status: AC
  Filled 2020-07-11: qty 10

## 2020-07-11 MED ORDER — POTASSIUM CHLORIDE CRYS ER 20 MEQ PO TBCR
40.0000 meq | EXTENDED_RELEASE_TABLET | Freq: Two times a day (BID) | ORAL | Status: AC
  Administered 2020-07-11 – 2020-07-12 (×2): 40 meq via ORAL
  Filled 2020-07-11 (×2): qty 2

## 2020-07-11 MED ORDER — DEXTROSE 50 % IV SOLN: 0.0000 mL | INTRAVENOUS | Status: DC | PRN

## 2020-07-11 MED ORDER — SODIUM CHLORIDE 0.9 % IV SOLN
2.0000 g | Freq: Once | INTRAVENOUS | Status: AC
  Administered 2020-07-11: 2 g via INTRAVENOUS
  Filled 2020-07-11: qty 20

## 2020-07-11 MED ORDER — VANCOMYCIN VARIABLE DOSE PER UNSTABLE RENAL FUNCTION (PHARMACIST DOSING)
Status: DC
  Filled 2020-07-11: qty 1

## 2020-07-11 MED ORDER — PROPOFOL 10 MG/ML IV BOLUS
INTRAVENOUS | Status: DC | PRN
  Administered 2020-07-11: 160 mg via INTRAVENOUS

## 2020-07-11 MED ORDER — ONDANSETRON HCL 4 MG/2ML IJ SOLN
INTRAMUSCULAR | Status: DC | PRN
  Administered 2020-07-11: 4 mg via INTRAVENOUS

## 2020-07-11 MED ORDER — FENTANYL CITRATE (PF) 250 MCG/5ML IJ SOLN
INTRAMUSCULAR | Status: DC | PRN
  Administered 2020-07-11: 100 ug via INTRAVENOUS
  Administered 2020-07-11: 50 ug via INTRAVENOUS
  Administered 2020-07-11: 100 ug via INTRAVENOUS

## 2020-07-11 MED ORDER — ROCURONIUM BROMIDE 10 MG/ML (PF) SYRINGE
PREFILLED_SYRINGE | INTRAVENOUS | Status: AC
  Filled 2020-07-11: qty 10

## 2020-07-11 MED ORDER — 0.9 % SODIUM CHLORIDE (POUR BTL) OPTIME
TOPICAL | Status: DC | PRN
  Administered 2020-07-11: 1000 mL

## 2020-07-11 MED ORDER — LACTATED RINGERS IV BOLUS (SEPSIS): 1000.0000 mL | Freq: Once | INTRAVENOUS | Status: DC

## 2020-07-11 MED ORDER — VANCOMYCIN HCL 1000 MG IV SOLR
INTRAVENOUS | Status: DC | PRN
  Administered 2020-07-11: 1000 mg via INTRAVENOUS

## 2020-07-11 MED ORDER — DIPHENHYDRAMINE HCL 50 MG/ML IJ SOLN
INTRAMUSCULAR | Status: AC
  Filled 2020-07-11: qty 1

## 2020-07-11 MED ORDER — FENTANYL CITRATE (PF) 250 MCG/5ML IJ SOLN
INTRAMUSCULAR | Status: AC
  Filled 2020-07-11: qty 5

## 2020-07-11 MED ORDER — IOHEXOL 300 MG/ML  SOLN
100.0000 mL | Freq: Once | INTRAMUSCULAR | Status: AC | PRN
  Administered 2020-07-11: 100 mL via INTRAVENOUS

## 2020-07-11 MED ORDER — MORPHINE SULFATE (PF) 2 MG/ML IV SOLN
2.0000 mg | INTRAVENOUS | Status: DC | PRN
Start: 2020-07-11 — End: 2020-07-14
  Administered 2020-07-13 – 2020-07-14 (×3): 2 mg via INTRAVENOUS
  Filled 2020-07-11 (×3): qty 1

## 2020-07-11 MED ORDER — DIPHENHYDRAMINE HCL 50 MG/ML IJ SOLN
INTRAMUSCULAR | Status: DC | PRN
  Administered 2020-07-11: 12.5 mg via INTRAVENOUS

## 2020-07-11 MED ORDER — DEXTROSE IN LACTATED RINGERS 5 % IV SOLN: INTRAVENOUS | Status: DC

## 2020-07-11 MED ORDER — VANCOMYCIN HCL 1250 MG/250ML IV SOLN
1250.0000 mg | Freq: Two times a day (BID) | INTRAVENOUS | Status: DC
  Filled 2020-07-11: qty 250

## 2020-07-11 MED ORDER — CHLORHEXIDINE GLUCONATE CLOTH 2 % EX PADS
6.0000 | MEDICATED_PAD | Freq: Every day | CUTANEOUS | Status: DC
  Administered 2020-07-11 – 2020-07-13 (×3): 6 via TOPICAL

## 2020-07-11 MED ORDER — POTASSIUM CHLORIDE 10 MEQ/100ML IV SOLN
10.0000 meq | INTRAVENOUS | Status: AC
  Administered 2020-07-11 (×2): 10 meq via INTRAVENOUS
  Filled 2020-07-11 (×2): qty 100

## 2020-07-11 MED ORDER — PANTOPRAZOLE SODIUM 40 MG IV SOLR
40.0000 mg | INTRAVENOUS | Status: DC
  Administered 2020-07-12: 40 mg via INTRAVENOUS
  Filled 2020-07-11 (×2): qty 40

## 2020-07-11 SURGICAL SUPPLY — 49 items
BANDAGE ESMARK 6X9 LF (GAUZE/BANDAGES/DRESSINGS) IMPLANT
BNDG COHESIVE 4X5 TAN STRL (GAUZE/BANDAGES/DRESSINGS) ×2 IMPLANT
BNDG ELASTIC 4X5.8 VLCR STR LF (GAUZE/BANDAGES/DRESSINGS) ×2 IMPLANT
BNDG ELASTIC 6X10 VLCR STRL LF (GAUZE/BANDAGES/DRESSINGS) IMPLANT
BNDG ESMARK 4X9 LF (GAUZE/BANDAGES/DRESSINGS) ×2 IMPLANT
BNDG ESMARK 6X9 LF (GAUZE/BANDAGES/DRESSINGS)
BNDG GAUZE ELAST 4 BULKY (GAUZE/BANDAGES/DRESSINGS) IMPLANT
CANISTER WOUNDNEG PRESSURE 500 (CANNISTER) ×2 IMPLANT
CONT SPEC 4OZ CLIKSEAL STRL BL (MISCELLANEOUS) ×2 IMPLANT
COVER SURGICAL LIGHT HANDLE (MISCELLANEOUS) ×4 IMPLANT
COVER WAND RF STERILE (DRAPES) IMPLANT
CUFF TOURN SGL QUICK 18X4 (TOURNIQUET CUFF) ×2 IMPLANT
CUFF TOURN SGL QUICK 34 (TOURNIQUET CUFF)
CUFF TRNQT CYL 34X4.125X (TOURNIQUET CUFF) IMPLANT
DRAPE SURG 17X23 STRL (DRAPES) ×2 IMPLANT
DRAPE U-SHAPE 47X51 STRL (DRAPES) IMPLANT
DRSG PAD ABDOMINAL 8X10 ST (GAUZE/BANDAGES/DRESSINGS) IMPLANT
DRSG VAC ATS MED SENSATRAC (GAUZE/BANDAGES/DRESSINGS) ×2 IMPLANT
DRSG XEROFORM 1X8 (GAUZE/BANDAGES/DRESSINGS) IMPLANT
DURAPREP 26ML APPLICATOR (WOUND CARE) ×2 IMPLANT
ELECT REM PT RETURN 9FT ADLT (ELECTROSURGICAL) ×2
ELECTRODE REM PT RTRN 9FT ADLT (ELECTROSURGICAL) ×1 IMPLANT
GAUZE SPONGE 4X4 12PLY STRL (GAUZE/BANDAGES/DRESSINGS) IMPLANT
GAUZE SPONGE 4X4 12PLY STRL LF (GAUZE/BANDAGES/DRESSINGS) IMPLANT
GAUZE XEROFORM 1X8 LF (GAUZE/BANDAGES/DRESSINGS) IMPLANT
GLOVE BIOGEL M STRL SZ7.5 (GLOVE) ×2 IMPLANT
GLOVE INDICATOR 8.0 STRL GRN (GLOVE) ×2 IMPLANT
GOWN STRL REUS W/ TWL LRG LVL3 (GOWN DISPOSABLE) ×1 IMPLANT
GOWN STRL REUS W/ TWL XL LVL3 (GOWN DISPOSABLE) ×1 IMPLANT
GOWN STRL REUS W/TWL LRG LVL3 (GOWN DISPOSABLE) ×2
GOWN STRL REUS W/TWL XL LVL3 (GOWN DISPOSABLE) ×2
HANDPIECE INTERPULSE COAX TIP (DISPOSABLE) ×2
KIT BASIN OR (CUSTOM PROCEDURE TRAY) ×2 IMPLANT
KIT TURNOVER KIT B (KITS) ×2 IMPLANT
MANIFOLD NEPTUNE II (INSTRUMENTS) ×2 IMPLANT
NEEDLE 22X1 1/2 (OR ONLY) (NEEDLE) IMPLANT
NS IRRIG 1000ML POUR BTL (IV SOLUTION) ×2 IMPLANT
PACK ORTHO EXTREMITY (CUSTOM PROCEDURE TRAY) ×2 IMPLANT
PAD ARMBOARD 7.5X6 YLW CONV (MISCELLANEOUS) ×4 IMPLANT
PAD CAST 4YDX4 CTTN HI CHSV (CAST SUPPLIES) ×1 IMPLANT
PADDING CAST COTTON 4X4 STRL (CAST SUPPLIES) ×2
SET CYSTO W/LG BORE CLAMP LF (SET/KITS/TRAYS/PACK) ×2 IMPLANT
SET HNDPC FAN SPRY TIP SCT (DISPOSABLE) ×1 IMPLANT
SPONGE LAP 18X18 RF (DISPOSABLE) ×4 IMPLANT
STOCKINETTE IMPERVIOUS 9X36 MD (GAUZE/BANDAGES/DRESSINGS) IMPLANT
SUT ETHILON 2 0 FS 18 (SUTURE) IMPLANT
TUBE CONNECTING 12X1/4 (SUCTIONS) ×2 IMPLANT
UNDERPAD 30X36 HEAVY ABSORB (UNDERPADS AND DIAPERS) ×2 IMPLANT
YANKAUER SUCT BULB TIP NO VENT (SUCTIONS) ×2 IMPLANT

## 2020-07-11 NOTE — Progress Notes (Signed)
Notified bedside nurse of need to draw repeat lactic acid. 

## 2020-07-11 NOTE — Progress Notes (Signed)
Initial Nutrition Assessment  DOCUMENTATION CODES:   Not applicable  INTERVENTION:   - RD will monitor for diet advancement and add oral nutrition supplements as appropriate  NUTRITION DIAGNOSIS:   Increased nutrient needs related to acute illness (necrotizing fasciitis) as evidenced by estimated needs.  GOAL:   Patient will meet greater than or equal to 90% of their needs  MONITOR:   Diet advancement, Labs, Weight trends, Skin, I & O's  REASON FOR ASSESSMENT:   Diagnosis    ASSESSMENT:   30 year old female who presented to the ED on 6/14 with LUE infection due to a needle breaking off into her arm a week ago. PMH of T1DM, heroin abuse, hepatitis C, depression. Pt admitted with necrotizing fasciitis of LUE and DKA.  6/14 - s/p I&D of LUE abscess, excisional debridement of LUE, removal of foreign body, placement of wound VAC  Discussed pt with RN and during ICU rounds. Pt currently NPO and on DKA protocol.  Met with pt at bedside. Pt extremely lethargic. Pt unable to provide any history at this time. Pt briefly opened eyes during NFPE but otherwise did not interact with RD.  Reviewed available weight history in chart. Current weight is consistent with weight from 05/16/20 encounter and up from weight on 04/01/19 encounter.  RD will monitor for diet advancement and order oral nutrition supplements as appropriate. Pt with increased nutrient needs related to wound healing.  Medications reviewed and include: IV protonix, IV abx, insulin gtt, IV KCl 10 mEq x 6 runs, IV magnesium sulfate 2 grams once, IV potassium phosphate 45 mmol once IVF: D5 in LR @ 125 ml/hr  Labs reviewed: sodium 132, potassium 2.3 (being replaced), BUN 28, creatinine 1.19, phosphorus <1.0 (being replaced), magnesium 1.5 (being replaced), lactic acid 2.5 CBG's: 212-539 x 12 hours (trending down)  I/O's: +1.6 L since admit  NUTRITION - FOCUSED PHYSICAL EXAM:  Flowsheet Row Most Recent Value  Orbital  Region No depletion  Upper Arm Region Mild depletion  Thoracic and Lumbar Region No depletion  Buccal Region No depletion  Temple Region No depletion  Clavicle Bone Region Mild depletion  Clavicle and Acromion Bone Region Mild depletion  Scapular Bone Region Unable to assess  Dorsal Hand Mild depletion  Patellar Region No depletion  Anterior Thigh Region No depletion  Posterior Calf Region No depletion  Edema (RD Assessment) None  Hair Reviewed  Eyes Unable to assess  Mouth Unable to assess  Skin Reviewed  Nails Reviewed       Diet Order:   Diet Order             Diet NPO time specified  Diet effective now                   EDUCATION NEEDS:   Not appropriate for education at this time  Skin:  Skin Assessment: Skin Integrity Issues: Wound VAC: L arm  Last BM:  no documented BM  Height:   Ht Readings from Last 1 Encounters:  07/11/20 _0  (1.702 m)    Weight:   Wt Readings from Last 1 Encounters:  07/11/20 84 kg    BMI:  Body mass index is 29 kg/m.  Estimated Nutritional Needs:   Kcal:  2100-2300  Protein:  105-125 grams  Fluid:  >/= 2.0 L/day    Gustavus Bryant, MS, RD, LDN Inpatient Clinical Dietitian Please see AM[facility]ON for contact information.

## 2020-07-11 NOTE — Progress Notes (Signed)
Notified bedside nurse of need to draw repeat lactic acid.  Pt in ICU now

## 2020-07-11 NOTE — ED Notes (Signed)
Pt went to CT scan.

## 2020-07-11 NOTE — Progress Notes (Addendum)
Pharmacy Electrolyte Replacement  Recent Labs:  Recent Labs    07/11/20 0257 07/11/20 0357 07/11/20 0621  K  --    < > 2.6*  MG 1.9  --   --   PHOS 4.1  --   --   CREATININE  --    < > 0.80   < > = values in this interval not displayed.    Low Critical Values (K </= 2.5, Phos </= 1, Mg </= 1) Present: None  Plan: - K 2.6 - failed po attempt, K-runs x 8, f/u recheck post  K-runs - Mg 1.9 - 2g IV x 1, f/u recheck in the AM  Addendum - ~1000 AM labs showed K 2.3, Phos <1, Mg 1.5. Total of K-runs x 6, KPhos 45 mmol x 1, additional Mg 2g x 1 for total of 4g replacement. Recheck BMET at 2200 - discussed with Dr. Gaynell Face.  Thank you for allowing pharmacy to be a part of this patient's care.  Georgina Pillion, PharmD, BCPS Clinical Pharmacist Clinical phone for 07/11/2020: [phone] 07/11/2020 9:45 AM   **Pharmacist phone directory can now be found on [email or web address] ([email or web address]).  Listed under [facility].

## 2020-07-11 NOTE — H&P (Signed)
NAME:  Anna Dennis, MRN:  [id number], DOB:  1990-02-29, LOS: 0 ADMISSION DATE:  07/11/2020, CONSULTATION DATE:  07/11/2020 REFERRING MD:  Dr. Bebe Shaggy, CHIEF COMPLAINT:  DKA, Sepsis   History of Present Illness:  30 year old female presents to ED on 6/14 with weakness, hyperglycemia, and left arm pain. Patient reports that she had a needle "snap" off into her arm a week ago, since this time has experienced swelling and pain, further reports not taken her insulin in over a week after running out. On arrival to ED HR 133. BP 144/86. Glucose 598. Patient with noted large abscess with necrotic center to left bicep, surrounding area red and warm. Orthopedics consulted plans to take to OR for management. Critical Care Consulted for admission.   Pertinent  Medical History  Type 1 DM, Heroin Abuse, Hep C, Depression  Significant Hospital Events: Including procedures, antibiotic start and stop dates in addition to other pertinent events   6/14 Presents to ED. Taken to OR.   Interim History / Subjective:  As above.   Objective   Blood pressure (!) 143/91, pulse (!) 133, temperature 97.9 F (36.6 C), temperature source Oral, resp. rate (!) 23, height 5\' 7"  (1.702 m), weight 84 kg, SpO2 100 %.        Intake/Output Summary (Last 24 hours) at 07/11/2020 0301 Last data filed at 07/11/2020 0152 Gross per 24 hour  Intake 200 ml  Output --  Net 200 ml   Filed Weights   07/11/20 0202  Weight: 84 kg    Examination: General: Adult female, appears acutely ill  HENT: Dry MM  Lungs: Moderate accessory muscle use  Cardiovascular: Tachy, HR 122 Abdomen: non-distended  Extremities: LUE with redness and warmth centered with wound  Neuro: alert, follows simple commands, appears agitated  GU: deferred   Labs/imaging that I havepersonally reviewed  (right click and "Reselect all SmartList Selections" daily)  XR Left Humerus 6/14 > Extensive soft tissue swelling and edematous changes with widespread  soft tissue gas in the anteromedial upper arm concerning for an aggressive soft tissue infection with gas-forming organism. Emergent surgical assessment is warranted. 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 MAG/Phos/lactic acid pending   Heroin Abuse  Plan -Monitor for withdrawal symptoms  -UDS/ETOH pending  -HIV pending   Best practice (right click and "Reselect all SmartList Selections" daily)  Diet:  NPO Pain/Anxiety/Delirium protocol (if indicated): No VAP protocol (if indicated): Not indicated DVT prophylaxis: Subcutaneous Heparin GI prophylaxis: PPI Glucose control:  Insulin gtt Central venous access:  Yes, and it is still needed Arterial line:  N/A Foley:  N/A Mobility:  bed rest  PT consulted: N/A Last date of multidisciplinary goals of care discussion [Pending] Code Status:  full code Disposition: Admit to ICU   Labs   CBC: No results for input(s):  WBC, NEUTROABS, HGB, HCT, MCV, PLT in the last 168 hours.  Basic Metabolic Panel: No results for input(s): NA, K, CL, CO2, GLUCOSE, BUN, CREATININE, CALCIUM, MG, PHOS in the last 168 hours. GFR: CrCl cannot be calculated (Patient's most recent lab result is older than the maximum 21 days allowed.). No results for input(s): PROCALCITON, WBC, LATICACIDVEN in the last 168 hours.  Liver Function  Tests: No results for input(s): AST, ALT, ALKPHOS, BILITOT, PROT, ALBUMIN in the last 168 hours. No results for input(s): LIPASE, AMYLASE in the last 168 hours. No results for input(s): AMMONIA in the last 168 hours.  ABG    Component Value Date/Time   HCO3 15.0 (L) 07/25/2016 0941   TCO2 16 07/25/2016 0941   ACIDBASEDEF 9.0 (H) 07/25/2016 0941   O2SAT 98.0 07/25/2016 0941     Coagulation Profile: No results for input(s): INR, PROTIME in the last 168 hours.  Cardiac Enzymes: No results for input(s): CKTOTAL, CKMB, CKMBINDEX, TROPONINI in the last 168 hours.  HbA1C: Hgb A1c MFr Bld  Date/Time Value Ref Range Status  09/04/2015 11:24 PM 13.7 (H) 4.8 - 5.6 % Final    Comment:    (NOTE)         Pre-diabetes: 5.7 - 6.4         Diabetes: >6.4         Glycemic control for adults with diabetes: <7.0   02/19/2012 09:34 PM 8.2 (H) <5.7 % Final    Comment:    (NOTE)                                                                       According to the ADA Clinical Practice Recommendations for 2011, when HbA1c is used as a screening test:  >=6.5%   Diagnostic of Diabetes Mellitus           (if abnormal result is confirmed) 5.7-6.4%   Increased risk of developing Diabetes Mellitus References:Diagnosis and Classification of Diabetes Mellitus,Diabetes Care,2011,34(Suppl 1):S62-S69 and Standards of Medical Care in         Diabetes - 2011,Diabetes Care,2011,34 (Suppl 1):S11-S61.    CBG: Recent Labs  Lab 07/11/20 0155  GLUCAP 598*    Review of Systems:   Pain to left upper arm, thirst. Does not answer further questions   Past Medical History:  She,  has a past medical history of Depression, Diabetes mellitus, GERD (gastroesophageal reflux disease), Hepatitis C, Heroin abuse (HCC), Iron deficiency anemia, IVDU (intravenous drug user), and OCD (obsessive compulsive disorder).   Surgical History:  No past surgical history on file.   Social History:   reports that she has been  smoking cigarettes. She has never used smokeless tobacco. She reports current alcohol use. She reports current drug use.   Family History:  Her family history includes Bipolar disorder in her mother; Schizophrenia in her sister.   Allergies No Known Allergies   Home Medications  Prior to Admission medications   Medication Sig Start Date End Date Taking? Authorizing Provider  citalopram (CELEXA) 20 MG tablet Take 20 mg by mouth daily.    [provider]  doxycycline (VIBRAMYCIN) 100 MG capsule Take 1 capsule (100 mg total) by mouth 2 (two) times daily. 05/16/20   Fondaw,  Wylder S, PA  ferrous sulfate 324 MG TBEC Take 324 mg by mouth daily with breakfast.    [provider]  ibuprofen (ADVIL,MOTRIN) 600 MG tablet Take 1 tablet (600 mg total) by mouth every 6 (six) hours as needed. Patient taking differently: Take 600 mg by mouth every 6 (six) hours as needed for mild pain. 07/08/16   Joy, Shawn C, PA-C  insulin glargine (LANTUS) 100 UNIT/ML injection Inject 50 Units into the skin at bedtime.    [provider]  insulin lispro (HUMALOG) 100 UNIT/ML injection Use 1-15 TID AC as needed Patient taking differently: Inject 1-15 Units into the skin See admin instructions. Give 1 unit for every 5g of carbs, if blood sugar is 125-150=1unit, 151-175=2units, 176-200=3units. Increase by 1 unit for every 25 increment of blood sugar. 09/05/15   Alison Murray, MD  medroxyPROGESTERone (DEPO-PROVERA) 150 MG/ML injection Inject 150 mg into the muscle every 3 (three) months.    [provider]  TURMERIC CURCUMIN PO Take 1 capsule by mouth daily.    [provider]  insulin aspart (NOVOLOG) 100 UNIT/ML injection Inject 6 Units into the skin 3 (three) times daily with meals. 02/22/12 02/28/12  Richarda Overlie, MD  Omeprazole 20 MG TBEC Take 1 tablet (20 mg total) by mouth daily. 02/28/12 03/04/12  Cleora Fleet, MD     Critical care time: 52 minutes    Jovita Kussmaul,  AGACNP-BC [facility]  PCCM Pgr: [phone]

## 2020-07-11 NOTE — Op Note (Signed)
[name] female 30 y.o. 07/11/2020  PreOperative Diagnosis: Left upper extremity deep necrotizing soft tissue abscess 8 x 10 cm anteromedial arm eschar with underlying necrotic tissue Retained foreign body left upper arm  PostOperative Diagnosis: Same  PROCEDURE: Incision and drainage of left arm deep abscess, multiple cavities Excisional debridement of anteromedial arm eschar and underlying necrotic tissue, skin, subcutaneous tissue, muscle and fascia Removal of deep foreign body Placement of negative pressure wound VAC  SURGEON: Dub Mikes, MD  ASSISTANT: None  ANESTHESIA: General  FINDINGS: See below  IMPLANTS: None  INDICATIONS:29 y.o. female presented to the emergency department with severe left upper arm infection.  She had a large eschar, surrounding erythema and pain with swelling and fluctuance.  Patient states she is an IV drug abuser and broke a needle off in her arm approximately 1 week ago and developed to this infection.  Given the significance of her infection she was emergently taken to the OR for debridement after concern for necrotizing fasciitis based on imaging studies.  Patient was also noted to have severely elevated blood glucose level and to be in DKA.  Patient admitted to taking drugs prior to presentation to the OR and therefore was unable to adequately consent and therefore decision was made to emergently take her to the OR due to the extremis nature of her infection, respiratory distress and sepsis.   Attempt was made to have a discussion with the patient about the risks, benefits and alternatives to surgery which include but are not limited to wound healing complications, infection, nonunion, malunion, need for further surgery as well as damage to surrounding structures.  We also discussed the potential for continued pain in that there were no guarantees of acceptable outcome.  We also discussed the severity of her infection and the  possibility of multiple surgeries and also the possibility of ultimate amputation.   PROCEDURE: Patient was identified in the preoperative holding area.  The left upper extremity was marked by myself.   Patient was taken to the operative suite and placed supine on the operative table.  General anesthesia was induced without difficulty.  The left upper extremity was prepped and draped in usual sterile fashion.  I began by gently palpating the arm.  There was a large area of fluctuance at the area of the eschar and proximal.  There is also fluctuance medially and laterally about the upper arm.  There were small track marks distal to the site of the eschar and this was the area of the presumed deep foreign body.  This measured approximately 5 to 6 cm from the antecubital crease which correlated with the evidence of foreign body on the x-ray and CT scan.  The eschar was measured and found to be approximately 7 cm x 8 cm.  There is 2 areas of eschar with a small thin skin bridge.  I then made an incision overlying the eschar.  This was done to decompress the deep abscess.  There was a rush of dishwater colored fluid and foul odor.  Approximately 1 to 200 cc of fluid came out of the abscess cavity.  The abscess cavity was further opened up to distally.  Within the incision rondure was placed to deloculated multiple areas of abscess.  There is found to be significant amount of purulent dishwater type fluid between the fascial planes of the anterior, medial and lateral arm.  The abscess cavity was then irrigated copiously with normal saline.  It was noted that within the abscess  cavity there was a significant amount of necrotic tissue and overlying the abscess cavity was a large eschar with devitalized skin and subcutaneous tissue.  I then proceeded to perform debridement of the eschar and underlying tissues.  Deep tissue was sent for culture.  Debridement type: Excisional Debridement  Side: left  Body  Location: Upper extremity  Tools used for debridement: scalpel, rongeurs, scissors  Pre-debridement Wound size (cm):   Length: 0 cm        width: 0 cm     depth: 0 cm  Post-debridement Wound size (cm):   Length: 8 cm        width: 8 cm     depth: 2 cm  Debridement depth beyond dead/damaged tissue down to healthy viable tissue: yes  Tissue layer involved: skin, subcutaneous tissue, muscle / fascia  Nature of tissue removed: Necrotic, Devitalized Tissue, Non-viable tissue, and Purulence  Irrigation volume: 9000 cc     Irrigation fluid type: Normal Saline  After excisional debridement it was noted that there was exposed fascia overlying the biceps musculature and exposed biceps tendon.  The cavity post debridement was inspected.  The area of skin loss after debridement was approximately 8 x 8 cm.  The depth of the wound was 2 cm.  The wound tracked up under the skin proximally approximately 4 cm.  Distally it tracked approximately 2 to 3 cm.  During the debridement it was noted there was significant amount of fluid within the fascial planes.  These were all deloculated and irrigated.  Medially the wound tracked under the skin approximately 2 to 4 cm and laterally approximately 2 cm.  The skin edges were adequately debrided back to healthy bleeding skin edges.  Then attempt was made to identify the foreign body.  The area of track marks was incised overlying the presumed foreign body.  Using a rondure and hemostat the area was opened up.  The underlying tissue was removed using a rondure.  After debridement and removal of this tissue using an x-ray there was no visible foreign body that remained.  Then the wound was dried and inspected.  1 g of vancomycin powder was placed within the wound.  A wound VAC was placed on the wound.  1 sponge was used.  There was good suction of the VAC after placement.  Soft dressing was placed overlying the VAC.  She was then moved to the hospital bed and  awakened from anesthesia and taken recovery in stable condition.  There were no complications.     POST OPERATIVE INSTRUCTIONS: Keep wound VAC plugged in on 125 continuous suction Patient will require repeat debridement later this week Patient will be admitted to the ICU per previous discussion with the emergency department We will follow-up on tissue cultures    TOURNIQUET TIME: No tourniquet was used  BLOOD LOSS:  less than 100 mL         DRAINS: none         SPECIMEN: none       COMPLICATIONS:  * No complications entered in OR log *         Disposition: PACU - guarded condition.         Condition: stable

## 2020-07-11 NOTE — ED Provider Notes (Signed)
[facility], [facility], [facility] EMERGENCY DEPARTMENT Provider Note   CSN: [id number] Arrival date & time: 07/11/20  0150     History Chief Complaint  Patient presents with   L upper arm infection    Possible IV needle broke off in arm   Level 5 caveat due to acuity of condition Anna Dennis is a 30 y.o. female.  The history is provided by the patient.  Weakness Severity:  Severe Onset quality:  Gradual Timing:  Constant Progression:  Worsening Chronicity:  New Context: drug use   Relieved by:  Nothing Worsened by:  Nothing    Patient has a history of diabetes, opioid use disorder presents with generalized weakness, hyperglycemia and left arm pain.  Patient has been out of her diabetic medicines for up to a week.  She also reports that about a week ago she was injecting drugs into her left upper arm and the needle broke.  She has not sought out medical care since that time. 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   OB History   No obstetric history on file.     Family History  Problem Relation Age of Onset   Bipolar disorder  Mother    Schizophrenia Sister     Social History   Tobacco Use   Smoking status: Every Day    Pack years: 0.00    Types: Cigarettes   Smokeless tobacco: Never  Substance Use Topics   Alcohol use: Yes    Alcohol/week: 0.0 standard drinks    Comment: occasional   Drug use: Yes    Comment: patient states clean since 03/29/14    Home Medications Prior to Admission medications   Medication Sig Start Date End Date Taking? Authorizing Provider  citalopram (CELEXA) 20 MG tablet Take 20 mg by mouth daily.    [provider]  doxycycline (VIBRAMYCIN) 100 MG capsule Take 1 capsule (100 mg total) by mouth 2 (two) times daily. 05/16/20   Gailen Shelter, PA  ferrous sulfate 324 MG TBEC Take 324 mg by mouth daily with breakfast.    [provider]  ibuprofen (ADVIL,MOTRIN) 600 MG tablet Take 1 tablet (600 mg total) by mouth every 6 (six) hours as needed. Patient taking differently: Take 600 mg by mouth every 6 (six) hours as needed for mild pain. 07/08/16   Joy, Shawn C, PA-C  insulin glargine (LANTUS) 100 UNIT/ML injection Inject 50 Units into the skin at bedtime.    [provider]  insulin lispro (HUMALOG) 100 UNIT/ML injection Use 1-15 TID AC as needed Patient taking differently: Inject 1-15 Units into  the skin See admin instructions. Give 1 unit for every 5g of carbs, if blood sugar is 125-150=1unit, 151-175=2units, 176-200=3units. Increase by 1 unit for every 25 increment of blood sugar. 09/05/15   Alison Murray, MD  medroxyPROGESTERone (DEPO-PROVERA) 150 MG/ML injection Inject 150 mg into the muscle every 3 (three) months.    [provider]  TURMERIC CURCUMIN PO Take 1 capsule by mouth daily.    [provider]  insulin aspart (NOVOLOG) 100 UNIT/ML injection Inject 6 Units into the skin 3 (three) times daily with meals. 02/22/12 02/28/12  Richarda Overlie, MD  Omeprazole 20 MG TBEC Take 1 tablet (20 mg total) by mouth daily. 02/28/12 03/04/12  Cleora Fleet, MD    Allergies    Patient has no known allergies.  Review of Systems   Review of Systems  Unable to perform ROS: Acuity of condition  Neurological:  Positive for weakness.   Physical Exam Updated Vital Signs Pulse (!) 134   Temp 97.9 F (36.6 C) (Oral)   Resp (!) 21   Ht 1.702 m (5\' 7" )   Wt 84 kg   SpO2 100%   BMI 29.00 kg/m   Physical Exam CONSTITUTIONAL: Disheveled, ill-appearing, smells of ketones HEAD: Normocephalic/atraumatic EYES: EOMI/PERRL ENMT: Mucous membranes dry NECK: supple no meningeal signs SPINE/BACK:entire spine nontender CV: S1/S2 noted, tachycardic LUNGS: Tachypneic with clear lung sounds bilaterally ABDOMEN: soft, nontender, no rebound or guarding, bowel sounds noted throughout abdomen GU:no cva tenderness NEURO: Pt is awake/alert/appropriate, moves all extremitiesx4.  No facial droop.   EXTREMITIES: pulses normal/equal, full ROM Large abscess noted with necrotic center and left bicep.  There is no crepitus.  There is a small amount of discharge.  Diffuse tenderness is noted.  See photo below SKIN: warm, see photo PSYCH: Anxious    Patient gave verbal permission to utilize photo for medical documentation only The image was not stored on any personal device ED Results / Procedures / Treatments   Labs (all labs ordered are listed, but only abnormal results are displayed) Labs Reviewed  CBG MONITORING, ED - Abnormal; Notable for the following components:      Result Value   Glucose-Capillary 598 (*)    All other components within normal limits  RESP PANEL BY RT-PCR (FLU A&B, COVID) ARPGX2  CULTURE, BLOOD (ROUTINE X 2)  CULTURE, BLOOD (ROUTINE X 2)  URINE CULTURE  LACTIC ACID, PLASMA  LACTIC ACID, PLASMA  COMPREHENSIVE METABOLIC PANEL  CBC WITH DIFFERENTIAL/PLATELET  PROTIME-INR  APTT  URINALYSIS, ROUTINE W REFLEX MICROSCOPIC  ETHANOL  RAPID URINE DRUG SCREEN, HOSP PERFORMED  HIV ANTIBODY (ROUTINE TESTING W REFLEX)  BASIC  METABOLIC PANEL  BASIC METABOLIC PANEL  BASIC METABOLIC PANEL  BASIC METABOLIC PANEL  BETA-HYDROXYBUTYRIC ACID  BETA-HYDROXYBUTYRIC ACID  BETA-HYDROXYBUTYRIC ACID  HEMOGLOBIN A1C  CBC  MAGNESIUM  PHOSPHORUS  MAGNESIUM  PHOSPHORUS  I-STAT BETA HCG BLOOD, ED ([facility], [facility], [facility] ONLY)  I-STAT VENOUS BLOOD GAS, ED  I-STAT CHEM 8, ED    EKG EKG Interpretation  Date/Time:  Tuesday July 11 2020 01:56:42 EDT Ventricular Rate:  136 PR Interval:  112 QRS Duration: 100 QT Interval:  305 QTC Calculation: 459 R Axis:   70 Text Interpretation: Sinus tachycardia RSR' in V1 or V2, right VCD or RVH Probable inferior infarct, age indeterminate Confirmed by [id number] (Zadie Rhine) on 07/11/2020 2:05:43 AM  Radiology DG Chest Port 1 View  Result Date: 07/11/2020 CLINICAL DATA:  Line placement EXAM: PORTABLE CHEST 1 VIEW COMPARISON:  07/11/2020 FINDINGS: Right central line tip in the SVC. No pneumothorax. Heart and mediastinal contours are within normal limits. No focal opacities or effusions. No acute bony abnormality. IMPRESSION: Right central line tip in the SVC.  No pneumothorax. No active cardiopulmonary disease. Electronically Signed   By: Charlett Nose M.D.   On: 07/11/2020 03:53   DG Chest Port 1 View  Result Date: 07/11/2020 CLINICAL DATA:  Possible sepsis, hyperglycemia, heroin use EXAM: PORTABLE CHEST 1 VIEW COMPARISON:  Radiograph 02/19/2012 FINDINGS: No consolidation, features of edema, pneumothorax, or effusion. Pulmonary vascularity is normally distributed. The cardiomediastinal contours are unremarkable. No acute osseous or soft tissue abnormality. 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 Telemetry leads overlie the chest. IMPRESSION: No acute cardiopulmonary abnormality. Electronically Signed   By: Kreg Shropshire M.D.   On: 07/11/2020 02:31   DG Humerus Left  Result Date: 07/11/2020 CLINICAL DATA:  Hyperglycemic, has not recently taken diabetes medications. Reports  broken IV needle in the upper arm 1 week prior. 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 EXAM: LEFT HUMERUS - 2+ VIEW COMPARISON:  None. FINDINGS: There is extensive circumferential soft tissue swelling of the upper arm with the large volume of soft tissue gas predominantly within the anteromedial soft tissues. A tiny linear 6 mm foreign body is seen in anterior soft tissues of the distal upper arm compatible with reported broken needle. No acute fracture, traumatic malalignment, suspicious osseous lesions or radiographic features of osteomyelitis seen at this time. IMPRESSION: Extensive soft tissue swelling and edematous changes with widespread soft tissue gas in the anteromedial upper arm concerning for an aggressive soft tissue infection with gas-forming organism. Emergent surgical assessment is warranted. Tiny 6 mm linear radiodense foreign body in the distal anterior aspect of the upper arm compatible with reported foreign body (broken intravenous needle tip) Critical Value/emergent results were called by telephone at the time of interpretation on 07/11/2020 at 2:30 am to provider Zadie Rhine , who verbally acknowledged these results. Electronically Signed   By: Kreg Shropshire M.D.   On: 07/11/2020 02:30   DG Hand Complete Left  Result Date: 07/11/2020 CLINICAL DATA:  Swelling, redness on posterior hand.  Pain EXAM: LEFT HAND - COMPLETE 3+ VIEW COMPARISON:  None. FINDINGS: There is no evidence of fracture or dislocation. There is no evidence of arthropathy or other focal bone abnormality. Soft tissues are unremarkable. No radiopaque foreign body. IMPRESSION: Negative. Electronically Signed   By: Charlett Nose M.D.   On: 07/11/2020 03:54    Procedures .Critical Care  Date/Time: 07/11/2020 2:16 AM Performed by: Zadie Rhine, MD Authorized by: Zadie Rhine, MD   Critical care provider statement:    Critical care time (minutes):  44   Critical care start time:  07/11/2020 2:16 AM   Critical care end time:   07/11/2020 3:00 AM   Critical care time was exclusive of:  Separately billable procedures and treating other patients   Critical care was necessary to treat or prevent imminent or life-threatening deterioration of the following conditions:  Dehydration, sepsis, metabolic crisis and endocrine crisis   Critical care was time spent personally by me on the following activities:  Development of treatment plan with patient or surrogate, discussions with consultants, evaluation of patient's response to treatment, examination of patient, re-evaluation of patient's condition, pulse oximetry, ordering and review of radiographic studies, ordering and review of laboratory studies, ordering and performing treatments and interventions, review of old charts and obtaining history from patient or surrogate   I assumed  direction of critical care for this patient from another provider in my specialty: no     Care discussed with: admitting provider   .Central Line  Date/Time: 07/11/2020 3:15 AM Performed by: Zadie Rhine, MD Authorized by: Zadie Rhine, MD   Consent:    Consent obtained:  Written   Consent given by:  Patient   Risks, benefits, and alternatives were discussed: yes     Risks discussed:  Arterial puncture, incorrect placement, infection and bleeding Universal protocol:    Immediately prior to procedure, a time out was called: yes     Patient identity confirmed:  Verbally with patient and provided demographic data Pre-procedure details:    Indication(s): central venous access and insufficient peripheral access     Hand hygiene: Hand hygiene performed prior to insertion     Sterile barrier technique: All elements of maximal sterile technique followed     Skin preparation:  Chlorhexidine   Skin preparation agent: Skin preparation agent completely dried prior to procedure   Anesthesia:    Anesthesia method:  Local infiltration   Local anesthetic:  Lidocaine 1% w/o epi Procedure details:     Location:  R internal jugular   Patient position:  Trendelenburg   Procedural supplies:  Triple lumen   Ultrasound guidance: yes     Ultrasound guidance timing: prior to insertion and real time     Sterile ultrasound techniques: Sterile gel and sterile probe covers were used     Number of attempts:  2   Successful placement: yes   Post-procedure details:    Post-procedure:  Dressing applied and line sutured   Assessment:  Blood return through all ports, no pneumothorax on x-ray, placement verified by x-ray and free fluid flow   Procedure completion:  Tolerated well, no immediate complications Comments:     After localizing with [place], I had appropriate venous return, and when I removed the syringe, it appeared to be arterial puncture.  Needle was removed and appropriate pressure applied.  No hematoma noted.  The artery was not dilated.  I then proceeded a second time and was easily able to identify right IJ by ultrasound and appropriate venous puncture was performed   Medications Ordered in ED Medications  lactated ringers bolus 1,000 mL (0 mLs Intravenous Stopped 07/11/20 0344)    And  lactated ringers bolus 1,000 mL (1,000 mLs Intravenous New Bag/Given 07/11/20 0359)    And  lactated ringers bolus 1,000 mL (has no administration in time range)  vancomycin (VANCOREADY) IVPB 1500 mg/300 mL (has no administration in time range)  docusate sodium (COLACE) capsule 100 mg (has no administration in time range)  polyethylene glycol (MIRALAX / GLYCOLAX) packet 17 g (has no administration in time range)  heparin injection 5,000 Units (has no administration in time range)  insulin regular, human (MYXREDLIN) 100 units/ 100 mL infusion (has no administration in time range)  lactated ringers infusion (has no administration in time range)  dextrose 5 % in lactated ringers infusion (has no administration in time range)  dextrose 50 % solution 0-50 mL (has no administration in time range)  clindamycin  (CLEOCIN) IVPB 600 mg (has no administration in time range)  piperacillin-tazobactam (ZOSYN) IVPB 3.375 g (has no administration in time range)  cefTRIAXone (ROCEPHIN) 2 g in sodium chloride 0.9 % 100 mL IVPB (0 g Intravenous Stopped 07/11/20 0344)    ED Course  I have reviewed the triage vital signs and the nursing notes.  Pertinent labs & imaging results that  were available during my care of the patient were reviewed by me and considered in my medical decision making (see chart for details).    MDM Rules/Calculators/A&P                          2:17 AM Patient presents in obvious diabetic ketoacidosis.  Patient is tachycardic, tachypneic and smells of ketones.  She is hyperglycemic and has not had her insulin recently.  IV fluids have been ordered.  Patient also has significant deep space infection to her left bicep from IV drug abuse.  Patient will likely need to have operative management.  Code sepsis has been called for presumed sepsis.  IV antibiotics have been ordered.  Imaging and labs are pending at this time 2:29 AM Discussed x-ray findings with radiology. Patient has soft tissue gas in the left upper arm Concern for necrotizing fasciitis.  Will consult orthopedics 2:44 AM D/w Dr Susa SimmondsAdair with orthopedics.  He will take patient to the operating room for management.  If patient is stable, would recommend CT left arm with contrast if possible prior to OR Patient will need central line due to need for multiple meds and fluids. Will consult critical care 4:00 AM Central line placed and we now have appropriate IV access.  Seen by critical care who admit to the ICU.  Creatinine is appropriate for IV contrast.  I have updated Dr. Susa SimmondsAdair about her condition.  Patient will likely get CT imaging, then go to ICU to await operative management of deep space infection in her left arm.  Patient is critically ill at this time Final Clinical Impression(s) / ED Diagnoses Final diagnoses:  Diabetic  ketoacidosis without coma associated with type 1 diabetes mellitus (HCC)  Dehydration  Abscess of left arm    Rx / DC Orders ED Discharge Orders     None        Zadie RhineWickline, Dushawn Pusey, MD 07/11/20 0405

## 2020-07-11 NOTE — Progress Notes (Signed)
     Anna Dennis is a 30 y.o. 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  She is not responding to questions.  She is moaning.  Objectyive: Vitals:   07/11/20 0745 07/11/20 1107  BP: 109/66   Pulse: (!) 104   Resp: 10   Temp: (!) 96.9 F (36.1 C) 97.7 F (36.5 C)  SpO2: 100%      Exam: Patient is asleep and not awaken to questioning.  She is seen to move and she is moaning.  Left arm with wound VAC in place.  Appropriate suctioning.  Canister shows 150 cc of bloody drainage.  Swelling present about the arm.  Microbiology: Gram-positive cocci in pairs, gram-negative rods, gram-positive rods  Assessment: Status post I&D and VAC placement for left upper arm necrotizing fasciitis with 8 x 8 cm area of skin loss   Plan: Patient has well-functioning VAC.  She has 150 cc of output.  She is not awakening to questioning but is seen to move her left upper extremity somewhat while laying in bed.  Patient has a severe polymicrobial infection and an 8 x 8 cm area of full-thickness skin loss with exposed underlying fascia and surrounding cellulitis.   The area of full-thickness skin loss will require coverage.  I will discuss her case with plastic surgery to see if they will help out when the time comes for definitive coverage.  She will likely require repeat debridement and VAC change later in the week.   Nicki Guadalajara, MD

## 2020-07-11 NOTE — Progress Notes (Signed)
Pharmacy Antibiotic Note  Anna Dennis is a 30 y.o. female admitted on 07/11/2020 with sepsis due to necrotizing soft tissue infection after leaving a broken needle in her arm x1wk.  Pharmacy has been consulted for vancomycin and Zosyn dosing.  Pt apparently with AKI; last known SCr 1y ago was <1, now 1.68; plan to check serial BMET.  Plan: Vancomycin 1500mg  IV x1; monitor SCr prior to redosing. Zosyn 3.375g IV q8h (4 hour infusion).  Height: 5\' 7"  (170.2 cm) Weight: 84 kg (185 lb 3 oz) IBW/kg (Calculated) : 61.6  Temp (24hrs), Avg:97.9 F (36.6 C), Min:97.9 F (36.6 C), Max:97.9 F (36.6 C)  Recent Labs  Lab 07/11/20 0357 07/11/20 0415  WBC 53.8*  --   CREATININE 1.68*  --   LATICACIDVEN  --  3.2*    Estimated Creatinine Clearance: 54.1 mL/min (A) (by C-G formula based on SCr of 1.68 mg/dL (H)).    No Known Allergies   Thank you for allowing pharmacy to be a part of this patient's care.  07/13/20, PharmD, BCPS  07/11/2020 5:52 AM

## 2020-07-11 NOTE — ED Notes (Signed)
Per Previous RN Cristal Deer pt is able to go upstairs.

## 2020-07-11 NOTE — Progress Notes (Signed)
  Echocardiogram 2D Echocardiogram has been performed.  Anna Dennis F 07/11/2020, 5:45 PM

## 2020-07-11 NOTE — Anesthesia Preprocedure Evaluation (Addendum)
Anesthesia Evaluation  Patient identified by MRN, date of birth, ID band Patient awake    Reviewed: Allergy & Precautions, NPO status , Patient's Chart, lab work & pertinent test results  Airway Mallampati: II  TM Distance: >3 FB Neck ROM: Full    Dental  (+) Poor Dentition   Pulmonary Current Smoker,    breath sounds clear to auscultation       Cardiovascular  Rhythm:Regular Rate:Tachycardia     Neuro/Psych    GI/Hepatic   Endo/Other  diabetes  Renal/GU      Musculoskeletal   Abdominal   Peds  Hematology   Anesthesia Other Findings Severe tenderness, edema, and induration of L upper arm with black eschar present.  Reproductive/Obstetrics                             Anesthesia Physical Anesthesia Plan  ASA: 4 and emergent  Anesthesia Plan: General   Post-op Pain Management:    Induction: Intravenous and Rapid sequence  PONV Risk Score and Plan: Ondansetron  Airway Management Planned: Oral ETT  Additional Equipment:   Intra-op Plan:   Post-operative Plan: Extubation in OR  Informed Consent: I have reviewed the patients History and Physical, chart, labs and discussed the procedure including the risks, benefits and alternatives for the proposed anesthesia with the patient or authorized representative who has indicated his/her understanding and acceptance.       Plan Discussed with: CRNA and Anesthesiologist  Anesthesia Plan Comments: (30 year old female with history of IV drug abuse. Needle broke off in upper arm 5 days ago. Now severe with abscess and necrotizing fascitis of L. Upper arm with soft tissue gas present. Patient in severe DKA with blood sugar 598 in ER. CMET still pending but HCO3 was 6 meq/l on Istat. Insulin drip and central line started in ER. Now on 13 units/hr of insulin. Last glucose 505. Plan GA with RSI.)       Anesthesia Quick Evaluation

## 2020-07-11 NOTE — ED Triage Notes (Signed)
Pt has not taken her diabetic medication for approximately a week. She is hyperglycemic. Pt also has an IV needle that has snap[ped off in her L upper arm approximately a week ago also. Pt did admit to snorting heroin tonight also.

## 2020-07-11 NOTE — Anesthesia Procedure Notes (Signed)
Procedure Name: Intubation Date/Time: 07/11/2020 5:23 AM Performed by: Claudina Lick, CRNA Pre-anesthesia Checklist: Patient identified, Emergency Drugs available, Suction available and Patient being monitored Patient Re-evaluated:Patient Re-evaluated prior to induction Oxygen Delivery Method: Circle system utilized Preoxygenation: Pre-oxygenation with 100% oxygen Induction Type: IV induction, Rapid sequence and Cricoid Pressure applied Laryngoscope Size: Miller and 2 Grade View: Grade I Tube type: Oral Tube size: 7.0 mm Number of attempts: 1 Airway Equipment and Method: Stylet Placement Confirmation: ETT inserted through vocal cords under direct vision, positive ETCO2 and breath sounds checked- equal and bilateral Secured at: 21 cm Tube secured with: Tape Dental Injury: Teeth and Oropharynx as per pre-operative assessment

## 2020-07-11 NOTE — Progress Notes (Signed)
Sepsis tracking by eLINK 

## 2020-07-11 NOTE — Consult Note (Signed)
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 female.  HPI: Patient presented to the emergency department with swelling, redness and wound as well as pain to the left upper extremity.  She states she broke a needle off in her arm approximately 1 week ago and developed swelling and pain.  X-rays in the emergency department demonstrated large area of soft tissue gas within the upper arm concerning for necrotizing soft tissue infection.  She was also noted to be an DKA.  Patient was having respiratory difficulty.  Orthopedics was consulted.  Patient admitted to using drugs prior to presentation.   Past Medical History:  Diagnosis Date   Depression    Diabetes mellitus    Insulin pump   GERD (gastroesophageal reflux disease)    Hepatitis C    Heroin abuse (HCC)    Iron deficiency anemia    IVDU (intravenous drug user)    OCD (obsessive compulsive disorder)     No past surgical history on file.  Family History  Problem Relation Age of Onset   Bipolar disorder Mother    Schizophrenia Sister     Social History:  reports that she has been smoking cigarettes. She has never used smokeless tobacco. She reports current alcohol use. She reports current drug use.  Allergies: No Known Allergies  Medications: I have reviewed the patient's current medications.  Results for orders placed or performed during the hospital encounter of 07/11/20 (from the past 48 hour(s))  CBG monitoring, ED     Status: Abnormal   Collection Time: 07/11/20  1:55 AM  Result Value Ref Range   Glucose-Capillary 598 (HH) 70 - 99 mg/dL    Comment: Glucose reference range applies only to samples taken after fasting for at least 8 hours.  Ethanol     Status: None   Collection Time: 07/11/20  2:05 AM  Result Value Ref Range   Alcohol, Ethyl (B) <10 <10 mg/dL    Comment:  (NOTE) Lowest detectable limit for serum alcohol is 10 mg/dL.  For medical purposes only. Performed at [facility], [address] [place], [place] [address]   Resp Panel by RT-PCR (Flu A&B, Covid) Nasopharyngeal Swab     Status: None   Collection Time: 07/11/20  2:39 AM   Specimen: Nasopharyngeal Swab; Nasopharyngeal(NP) swabs in vial transport medium  Result Value Ref Range   SARS Coronavirus 2 by RT PCR NEGATIVE NEGATIVE    Comment: (NOTE) SARS-CoV-2 target nucleic acids are NOT DETECTED.  The SARS-CoV-2 RNA is generally detectable in upper respiratory specimens during the acute phase of infection. The lowest concentration of SARS-CoV-2 viral copies this assay can detect is 138 copies/mL. A negative result does not preclude SARS-Cov-2 infection and should not be used as the sole basis for treatment or other patient management decisions. A negative result may occur with  improper specimen collection/handling, submission of specimen other than nasopharyngeal swab, presence of viral mutation(s) within the areas targeted by this assay, and inadequate number of viral copies(<138 copies/mL). A negative result must be combined with clinical observations, patient history, and epidemiological information. The expected result is Negative.  Fact Sheet for Patients:  BloggerCourse.comhttps://www.fda.gov/media/152166/download  Fact Sheet for Healthcare Providers:  SeriousBroker.ithttps://www.fda.gov/media/152162/download  This test is no t yet approved or cleared by the [place] FDA and  has been authorized for detection and/or diagnosis of SARS-CoV-2 by  FDA under an Emergency Use Authorization (EUA). This EUA will remain  in effect (meaning this test can be used) for the duration of the COVID-19 declaration under Section 564(b)(1) of the Act, 21 U.S.C.section 360bbb-3(b)(1), unless the authorization is terminated  or revoked sooner.       Influenza A by PCR NEGATIVE NEGATIVE   Influenza B by PCR  NEGATIVE NEGATIVE    Comment: (NOTE) The Xpert Xpress SARS-CoV-2/FLU/RSV plus assay is intended as an aid in the diagnosis of influenza from Nasopharyngeal swab specimens and should not be used as a sole basis for treatment. Nasal washings and aspirates are unacceptable for Xpert Xpress SARS-CoV-2/FLU/RSV testing.  Fact Sheet for Patients: BloggerCourse.com  Fact Sheet for Healthcare Providers: SeriousBroker.it  This test is not yet approved or cleared by the [place] FDA and has been authorized for detection and/or diagnosis of SARS-CoV-2 by FDA under an Emergency Use Authorization (EUA). This EUA will remain in effect (meaning this test can be used) for the duration of the COVID-19 declaration under Section 564(b)(1) of the Act, 21 U.S.C. section 360bbb-3(b)(1), unless the authorization is terminated or revoked.  Performed at [facility], [address], [place], [place] [address]   Magnesium     Status: None   Collection Time: 07/11/20  2:57 AM  Result Value Ref Range   Magnesium 1.9 1.7 - 2.4 mg/dL    Comment: Performed at [facility], [address], [place], [place] [address]  Phosphorus     Status: None   Collection Time: 07/11/20  2:57 AM  Result Value Ref Range   Phosphorus 4.1 2.5 - 4.6 mg/dL    Comment: Performed at [facility], [address] [place], [place] [address]  I-Stat beta hCG blood, ED     Status: None   Collection Time: 07/11/20  3:08 AM  Result Value Ref Range   I-stat hCG, quantitative <5.0 <5 mIU/mL   Comment 3            Comment:   GEST. AGE      CONC.  (mIU/mL)   <=1 WEEK        5 - 50     2 WEEKS       50 - 500     3 WEEKS       100 - 10,000     4 WEEKS     1,000 - 30,000        FEMALE AND NON-PREGNANT FEMALE:     LESS THAN 5 mIU/mL   CBG monitoring, ED     Status: Abnormal   Collection Time: 07/11/20  3:50 AM  Result Value Ref Range   Glucose-Capillary 539 (HH)  70 - 99 mg/dL    Comment: Glucose reference range applies only to samples taken after fasting for at least 8 hours.  CBC WITH DIFFERENTIAL     Status: Abnormal   Collection Time: 07/11/20  3:57 AM  Result Value Ref Range   WBC 53.8 (HH) 4.0 - 10.5 K/uL    Comment: REPEATED TO VERIFY WHITE COUNT CONFIRMED ON SMEAR THIS CRITICAL RESULT HAS VERIFIED AND BEEN CALLED TO J.JASCO,RN BY MELISSA BROGDON ON 06 14 2022 AT 0445, AND HAS BEEN READ BACK.     RBC 3.86 (L) 3.87 - 5.11 MIL/uL   Hemoglobin 10.9 (L) 12.0 - 15.0 g/dL   HCT 93.5 (L) 70.1 - 77.9 %   MCV 93.0 80.0 - 100.0 fL   MCH 28.2  26.0 - 34.0 pg   MCHC 30.4 30.0 - 36.0 g/dL   RDW 51.0 25.8 - 52.7 %   Platelets 579 (H) 150 - 400 K/uL   nRBC 0.0 0.0 - 0.2 %   Neutrophils Relative % 86 %   Neutro Abs 46.3 (H) 1.7 - 7.7 K/uL   Lymphocytes Relative 4 %   Lymphs Abs 2.1 0.7 - 4.0 K/uL   Monocytes Relative 6 %   Monocytes Absolute 3.2 (H) 0.1 - 1.0 K/uL   Eosinophils Relative 0 %   Eosinophils Absolute 0.0 0.0 - 0.5 K/uL   Basophils Relative 0 %   Basophils Absolute 0.0 0.0 - 0.1 K/uL   WBC Morphology INCREASED BANDS (>20% BANDS)     Comment: MILD LEFT SHIFT (1-5% METAS, OCC MYELO, OCC BANDS) VACUOLATED NEUTROPHILS    Immature Granulocytes 4 %   Abs Immature Granulocytes 2.20 (H) 0.00 - 0.07 K/uL   Polychromasia PRESENT     Comment: Performed at [facility], [address], [place], [place] [address]  Protime-INR     Status: Abnormal   Collection Time: 07/11/20  3:57 AM  Result Value Ref Range   Prothrombin Time 17.1 (H) 11.4 - 15.2 seconds   INR 1.4 (H) 0.8 - 1.2    Comment: (NOTE) INR goal varies based on device and disease states. Performed at [facility], [address] [place], [place] [address]   APTT     Status: None   Collection Time: 07/11/20  3:57 AM  Result Value Ref Range   aPTT 25 24 - 36 seconds    Comment: Performed at [facility], [address] [place], [place] [address]    DG  Chest Port 1 View  Result Date: 07/11/2020 CLINICAL DATA:  Line placement EXAM: PORTABLE CHEST 1 VIEW COMPARISON:  07/11/2020 FINDINGS: Right central line tip in the SVC. No pneumothorax. Heart and mediastinal contours are within normal limits. No focal opacities or effusions. No acute bony abnormality. IMPRESSION: Right central line tip in the SVC.  No pneumothorax. No active cardiopulmonary disease. Electronically Signed   By: Charlett Nose M.D.   On: 07/11/2020 03:53   DG Chest Port 1 View  Result Date: 07/11/2020 CLINICAL DATA:  Possible sepsis, hyperglycemia, heroin use EXAM: PORTABLE CHEST 1 VIEW COMPARISON:  Radiograph 02/19/2012 FINDINGS: No consolidation, features of edema, pneumothorax, or effusion. Pulmonary vascularity is normally distributed. The cardiomediastinal contours are unremarkable. No acute osseous or soft tissue abnormality. 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 Telemetry leads overlie the chest. IMPRESSION: No acute cardiopulmonary abnormality. Electronically Signed   By: Kreg Shropshire M.D.   On: 07/11/2020 02:31   DG Humerus Left  Result Date: 07/11/2020 CLINICAL DATA:  Hyperglycemic, has not recently taken diabetes medications. Reports broken IV needle in the upper arm 1 week prior. 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 EXAM: LEFT HUMERUS - 2+ VIEW COMPARISON:  None. FINDINGS: There is extensive circumferential soft tissue swelling of the upper arm with the large volume of soft tissue gas predominantly within the anteromedial soft tissues. A tiny linear 6 mm foreign body is seen in anterior soft tissues of the distal upper arm compatible with reported broken needle. No acute fracture, traumatic malalignment, suspicious osseous lesions or radiographic features of osteomyelitis seen at this time. IMPRESSION: Extensive soft tissue swelling and edematous changes with widespread soft tissue gas in the anteromedial upper arm concerning for an aggressive soft tissue  infection with gas-forming organism. Emergent  surgical assessment is warranted. Tiny 6 mm linear radiodense foreign body in the distal anterior aspect of the upper arm compatible with reported foreign body (broken intravenous needle tip) Critical Value/emergent results were called by telephone at the time of interpretation on 07/11/2020 at 2:30 am to provider Zadie Rhine , who verbally acknowledged these results. Electronically Signed   By: Kreg Shropshire M.D.   On: 07/11/2020 02:30   DG Hand Complete Left  Result Date: 07/11/2020 CLINICAL DATA:  Swelling, redness on posterior hand.  Pain EXAM: LEFT HAND - COMPLETE 3+ VIEW COMPARISON:  None. FINDINGS: There is no evidence of fracture or dislocation. There is no evidence of arthropathy or other focal bone abnormality. Soft tissues are unremarkable. No radiopaque foreign body. IMPRESSION: Negative. Electronically Signed   By: Charlett Nose M.D.   On: 07/11/2020 03:54   CT Extrem Up Entire Arm L WO/W CM  Result Date: 07/11/2020 CLINICAL DATA:  30 year old female with left upper extremity pain swelling and redness. Extensive soft tissue gas about the humerus on x-rays, with possible tiny metallic needle retained foreign body. EXAM: CT OF THE UPPER LEFT EXTREMITY WITHOUT AND WITH CONTRAST TECHNIQUE: Multidetector CT imaging of the upper left extremity was performed following the standard protocol before and during bolus administration of intravenous contrast. COMPARISON:  Left humerus radiographs 0216 hours. CONTRAST:  OMNIPAQUE IOHEXOL 300 MG/ML  SOLN FINDINGS: Study is moderately degraded by motion artifact. Subtle, linear roughly 8-9 mm hyperdense foreign body suspected in the ventral subcutaneous soft tissues on series 4, image 172 corresponding to the radiographic finding. This is 4 mm deep to the skin surface. Large volume of subcutaneous gas throughout the ventral upper arm, tracking medially more so than laterally. Superimposed diffuse subcutaneous  edema. No organized fluid collection identified. The soft tissue abnormality extends to the ventral muscle layer, and some ventral myositis is suspected (for example on series 4, image 103 at the mid humerus shaft level). IV contrast bolus is insufficient for detailed vascular evaluation. There are small but reactive appearing left axillary lymph nodes individually up to 14 mm short axis. Normal alignment of osseous structures at the visible left shoulder. Motion artifact degrades bone detail. And there is suspicious osteopenia of the ventral humerus cortex, for example on series 3, image 120. But no discrete cortical osteolysis. Alignment appears maintained at the left elbow, and no elbow joint effusion is identified. Grossly negative visible left chest and upper abdomen. IMPRESSION: 1. Moderately degraded by motion. 2. 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 3. Subtle retained metallic foreign body, possible needle fragment, located 4 mm deep to the skin surface on series 4, image 172. 4. No organized fluid collection or abscess identified. 5. Limited evaluation for humerus osteomyelitis due to motion, but there is suspicious osteopenia of the ventral humeral cortex. 6. Reactive right axillary lymph nodes. Electronically Signed   By: Odessa Fleming M.D.   On: 07/11/2020 04:56    Review of Systems  Unable to perform ROS: Acuity of condition  Blood pressure (!) 143/91, pulse (!) 133, temperature 97.9 F (36.6 C), temperature source Oral, resp. rate (!) 23, height 5\' 7"  (1.702 m), weight 84 kg, SpO2 100 %. Physical Exam HENT:     Head: Atraumatic.     Mouth/Throat:     Mouth: Mucous membranes are dry.  Eyes:     Extraocular Movements: Extraocular movements intact.  Cardiovascular:     Rate and Rhythm: Tachycardia present.     Pulses: Normal pulses.  Pulmonary:  Effort: Respiratory distress present.  Abdominal:     General: Abdomen is flat.  Musculoskeletal:        General: Swelling and  tenderness present.     Cervical back: Neck supple.     Comments: There is a large area of swelling, fluctuance and eschar on the anterior and anterolateral aspect of the arm.  Tender to palpation.  The central area of eschar is greater than 10 cm in diameter.  Patient has pain with range of motion of the elbow.  No tenderness to palpation about the forearm, wrist or hand.  Skin:    Findings: Erythema and lesion present.  Neurological:     General: No focal deficit present.     Mental Status: She is alert.    Assessment/Plan: Patient has a severe necrotizing soft tissue infection of the left upper extremity with overlying eschar.  She has retained foreign body in her arm where the needle was broken off.  Patient is in DKA and having respiratory difficulty as well as concern for sepsis.  Patient admitted to drug use prior to admission and therefore was unable to sign a consent form however given the life-threatening nature of her infection and overall condition her procedure is deemed emergent and therefore she will be taken to the OR for formal incision and drainage as well as debridement of her wound, foreign body removal and wound VAC placement.  I did attempt to have a discussion with the patient about the need for multiple surgeries and the risks, benefits of the surgery.  She was unable to voiced understanding due to her current condition.  No family was available.  Terance Hart 07/11/2020, 4:59 AM

## 2020-07-11 NOTE — Progress Notes (Signed)
Pharmacy Antibiotic Note  Anna Dennis is a 30 y.o. female admitted on 07/11/2020 with sepsis due to necrotizing soft tissue infection after leaving a broken needle in her arm x1wk.  Pharmacy has been consulted for Vancomycin and Zosyn dosing along with Clinda per MD for concern for necrotizing fasciitis.    The patient's SCr has come down to baseline (SCr 0.8). The patient did receive a total loading dose of 2.5g this AM (1.5g from pharmacy, additional 1g given in the OR). Will wait to start the Vancomycin maintenance dose this evening.   Plan: - Start Vancomycin 1250 mg IV every 12 hours (first today this evening at 2200) - Cont Zosyn 3.375g IV q8h (4 hour infusion). - Cont Clinda per MD until necrotizing fasciitis ruled out or patient receives ~72h - Will continue to follow renal function, culture results, LOT, and antibiotic de-escalation plans   Height: 5\' 7"  (170.2 cm) Weight: 84 kg (185 lb 3 oz) IBW/kg (Calculated) : 61.6  Temp (24hrs), Avg:97 F (36.1 C), Min:96.3 F (35.7 C), Max:97.9 F (36.6 C)  Recent Labs  Lab 07/11/20 0357 07/11/20 0415 07/11/20 0500 07/11/20 0621 07/11/20 0723  WBC 53.8*  --   --   --   --   CREATININE 1.68*  --  0.70 0.80  --   LATICACIDVEN  --  3.2*  --   --  2.5*     Estimated Creatinine Clearance: 113.6 mL/min (by C-G formula based on SCr of 0.8 mg/dL).    No Known Allergies  Vanc 6/14 >> - 1.5g LD at 0400 - 1g IV in OR at 0500 + 1g vanc powder in wound @ 0612 Zosyn 6/14 >> CTX 6/14 x 1 Clinda 6/14 >>   6/14 Flu/COVID >> neg 6/14 BCx >> 6/14 Wound tissue (intra-op) >>  Thank you for allowing pharmacy to be a part of this patient's care.  7/14, PharmD, BCPS Clinical Pharmacist Clinical phone for 07/11/2020: 07/13/2020 07/11/2020 9:50 AM   **Pharmacist phone directory can now be found on [email or web address] ([email or web address]).  Listed under [name].

## 2020-07-11 NOTE — ED Notes (Signed)
Pt arrived in short stay.. bedside report given

## 2020-07-11 NOTE — Transfer of Care (Signed)
Immediate Anesthesia Transfer of Care Note  Patient: Anna Dennis  Procedure(s) Performed: IRRIGATION AND DEBRIDEMENT EXTREMITY (Left: Arm Upper)  Patient Location: PACU  Anesthesia Type:General  Level of Consciousness: drowsy  Airway & Oxygen Therapy: Patient Spontanous Breathing and Patient connected to face mask oxygen  Post-op Assessment: Report given to RN and Post -op Vital signs reviewed and stable  Post vital signs: Reviewed and stable  Last Vitals:  Vitals Value Taken Time  BP    Temp    Pulse    Resp    SpO2      Last Pain:  Vitals:   07/11/20 0201  TempSrc:   PainSc: 5          Complications: No notable events documented.

## 2020-07-12 ENCOUNTER — Encounter (HOSPITAL_COMMUNITY): Payer: Self-pay | Admitting: Anesthesiology

## 2020-07-12 ENCOUNTER — Encounter (HOSPITAL_COMMUNITY): Payer: Self-pay | Admitting: Orthopaedic Surgery

## 2020-07-12 ENCOUNTER — Other Ambulatory Visit: Payer: Self-pay | Admitting: Orthopaedic Surgery

## 2020-07-12 DIAGNOSIS — E1010 Type 1 diabetes mellitus with ketoacidosis without coma: Secondary | ICD-10-CM

## 2020-07-12 DIAGNOSIS — L03114 Cellulitis of left upper limb: Secondary | ICD-10-CM

## 2020-07-12 LAB — GLUCOSE, CAPILLARY
Glucose-Capillary: 133 mg/dL — ABNORMAL HIGH (ref 70–99)
Glucose-Capillary: 138 mg/dL — ABNORMAL HIGH (ref 70–99)
Glucose-Capillary: 148 mg/dL — ABNORMAL HIGH (ref 70–99)
Glucose-Capillary: 151 mg/dL — ABNORMAL HIGH (ref 70–99)
Glucose-Capillary: 161 mg/dL — ABNORMAL HIGH (ref 70–99)
Glucose-Capillary: 170 mg/dL — ABNORMAL HIGH (ref 70–99)
Glucose-Capillary: 172 mg/dL — ABNORMAL HIGH (ref 70–99)
Glucose-Capillary: 176 mg/dL — ABNORMAL HIGH (ref 70–99)
Glucose-Capillary: 185 mg/dL — ABNORMAL HIGH (ref 70–99)
Glucose-Capillary: 186 mg/dL — ABNORMAL HIGH (ref 70–99)
Glucose-Capillary: 186 mg/dL — ABNORMAL HIGH (ref 70–99)
Glucose-Capillary: 191 mg/dL — ABNORMAL HIGH (ref 70–99)
Glucose-Capillary: 289 mg/dL — ABNORMAL HIGH (ref 70–99)

## 2020-07-12 LAB — PHOSPHORUS
Phosphorus: 3 mg/dL (ref 2.5–4.6)
Phosphorus: 1.1 mg/dL — ABNORMAL LOW (ref 2.5–4.6)

## 2020-07-12 LAB — BASIC METABOLIC PANEL
Anion gap: 9 (ref 5–15)
BUN: 17 mg/dL (ref 6–20)
CO2: 19 mmol/L — ABNORMAL LOW (ref 22–32)
Calcium: 7.7 mg/dL — ABNORMAL LOW (ref 8.9–10.3)
Chloride: 105 mmol/L (ref 98–111)
Creatinine, Ser: 0.67 mg/dL (ref 0.44–1.00)
GFR, Estimated: >60 mL/min (ref >60)
Glucose, Bld: 209 mg/dL — ABNORMAL HIGH (ref 70–99)
Potassium: 3.8 mmol/L (ref 3.5–5.1)
Sodium: 133 mmol/L — ABNORMAL LOW (ref 135–145)

## 2020-07-12 LAB — VANCOMYCIN, RANDOM: Vancomycin Rm: 6

## 2020-07-12 LAB — MAGNESIUM: Magnesium: 1.7 mg/dL (ref 1.7–2.4)

## 2020-07-12 LAB — BETA-HYDROXYBUTYRIC ACID
Beta-Hydroxybutyric Acid: 0.41 mmol/L — ABNORMAL HIGH (ref 0.05–0.27)
Beta-Hydroxybutyric Acid: 0.22 mmol/L (ref 0.05–0.27)

## 2020-07-12 LAB — HEMOGLOBIN A1C
Hgb A1c MFr Bld: 11.6 % — ABNORMAL HIGH (ref 4.8–5.6)
Mean Plasma Glucose: 286 mg/dL

## 2020-07-12 LAB — PATHOLOGIST SMEAR REVIEW

## 2020-07-12 MED ORDER — POTASSIUM PHOSPHATES 15 MMOLE/5ML IV SOLN
45.0000 mmol | Freq: Once | INTRAVENOUS | Status: AC
  Administered 2020-07-12: 45 mmol via INTRAVENOUS
  Filled 2020-07-12: qty 15

## 2020-07-12 MED ORDER — DEXTROSE 5 % IV SOLN
45.0000 mmol | Freq: Once | INTRAVENOUS | Status: AC
  Administered 2020-07-12: 45 mmol via INTRAVENOUS
  Filled 2020-07-12: qty 15

## 2020-07-12 MED ORDER — VANCOMYCIN HCL 1500 MG/300ML IV SOLN
1500.0000 mg | INTRAVENOUS | Status: AC
  Administered 2020-07-12: 1500 mg via INTRAVENOUS
  Filled 2020-07-12: qty 300

## 2020-07-12 MED ORDER — INSULIN ASPART 100 UNIT/ML IJ SOLN
2.0000 [IU] | INTRAMUSCULAR | Status: DC
  Administered 2020-07-12: 2 [IU] via SUBCUTANEOUS
  Administered 2020-07-12 – 2020-07-13 (×2): 4 [IU] via SUBCUTANEOUS
  Administered 2020-07-13: 2 [IU] via SUBCUTANEOUS

## 2020-07-12 MED ORDER — MAGNESIUM SULFATE 4 GM/100ML IV SOLN
4.0000 g | Freq: Once | INTRAVENOUS | Status: AC
  Administered 2020-07-12: 4 g via INTRAVENOUS
  Filled 2020-07-12: qty 100

## 2020-07-12 MED ORDER — VANCOMYCIN HCL 1250 MG/250ML IV SOLN
1250.0000 mg | Freq: Two times a day (BID) | INTRAVENOUS | Status: DC
  Administered 2020-07-12 – 2020-07-14 (×4): 1250 mg via INTRAVENOUS
  Filled 2020-07-12 (×5): qty 250

## 2020-07-12 MED ORDER — SODIUM CHLORIDE 0.9% FLUSH: 10.0000 mL | INTRAVENOUS | Status: DC | PRN

## 2020-07-12 MED ORDER — POTASSIUM PHOSPHATES 15 MMOLE/5ML IV SOLN: 30.0000 mmol | Freq: Once | INTRAVENOUS | Status: DC

## 2020-07-12 MED ORDER — POTASSIUM CHLORIDE CRYS ER 20 MEQ PO TBCR
40.0000 meq | EXTENDED_RELEASE_TABLET | Freq: Once | ORAL | Status: AC
  Administered 2020-07-12: 40 meq via ORAL
  Filled 2020-07-12: qty 2

## 2020-07-12 MED ORDER — SODIUM CHLORIDE 0.9% FLUSH
10.0000 mL | Freq: Two times a day (BID) | INTRAVENOUS | Status: DC
  Administered 2020-07-12 – 2020-07-14 (×5): 10 mL

## 2020-07-12 MED ORDER — INSULIN ASPART 100 UNIT/ML IJ SOLN
8.0000 [IU] | Freq: Once | INTRAMUSCULAR | Status: AC
  Administered 2020-07-12: 8 [IU] via SUBCUTANEOUS

## 2020-07-12 MED ORDER — INSULIN DETEMIR 100 UNIT/ML ~~LOC~~ SOLN
25.0000 [IU] | Freq: Two times a day (BID) | SUBCUTANEOUS | Status: DC
  Administered 2020-07-12 – 2020-07-14 (×4): 25 [IU] via SUBCUTANEOUS
  Filled 2020-07-12 (×6): qty 0.25

## 2020-07-12 NOTE — Progress Notes (Signed)
Alerted by Glade Nurse RN that patient was trying to snort substance. Immediately went into patients room and found white substance in patients hand and inside of torn lottery ticket. This all happened after patient had a female visitor.  Patient says that substance was heroin. Had to physically restrain patients arms as she was trying to continue to ingest substance in my presence. With Assistance from [facility], Rn we were able to retreive substance. Patient also found snorting straw underneath patient. Room searched for any other contraband.  None found. Personal belongings removed from room. Dr Arsenio Loader notified . Will continue to monitor.

## 2020-07-12 NOTE — Consult Note (Signed)
[facility]  Reason for Consult:Right upper extremity wound/abscess Referring Physician: Dr. Dub Mikeshristopher Adair, MD  [name] is an 30 y.o. female.  HPI: 30 yo female [facility]14 with swelling, redness and pain to her left upper extremity. She reports she broke off a needle in her approximately 1 week ago and has subsequently had pain, swelling and blistering of her arm. Reports she had been picking at the blisters and feels as if this resulted in the large black area over her arm.   She underwent debridement of left upper extremity of deep necrotizing soft tissue abscess and removal of deep foreign body with placement of wound vac on 6/14 with orthopedics.   She is sitting up in bed today, friend at bedside. Reports pain to her arm. Reports some decreased sensation to distal extremity, but does have sensation.  Past Medical History:  Diagnosis Date   Depression    Diabetes mellitus    Insulin pump   GERD (gastroesophageal reflux disease)    Hepatitis C    Heroin abuse (HCC)    Iron deficiency anemia    IVDU (intravenous drug user)    OCD (obsessive compulsive disorder)     Past Surgical History:  Procedure Laterality Date   I & D EXTREMITY Left 07/11/2020   Procedure: IRRIGATION AND DEBRIDEMENT EXTREMITY;  Surgeon: Terance HartAdair, Christopher R, MD;  [facility];  Service: Orthopedics;  Laterality: Left;    Family History  Problem Relation Age of Onset   Bipolar disorder Mother    Schizophrenia Sister     Social History:  reports that she has been smoking cigarettes. She has never used smokeless tobacco. She reports current alcohol use. She reports current drug use.  Allergies: No Known Allergies  Medications: I have reviewed the patient's current medications.  Results for orders placed or performed during the hospital encounter of 07/11/20 (from the past 48 hour(s))  CBG monitoring, ED     Status: Abnormal   Collection Time: 07/11/20  1:55 AM   Result Value Ref Range   Glucose-Capillary 598 (HH) 70 - 99 mg/dL    Comment: Glucose reference range applies only to samples taken after fasting for at least 8 hours.  Blood Culture (routine x 2)     Status: None (Preliminary result)   Collection Time: 07/11/20  2:03 AM   Specimen: BLOOD  Result Value Ref Range   Specimen Description BLOOD RIGHT ANTECUBITAL    Special Requests      BOTTLES DRAWN AEROBIC ONLY Blood Culture results may not be optimal due to an inadequate volume of blood received in culture bottles   Culture      NO GROWTH 1 DAY Performed at [facility], [address] [place], [place] [id number]    Report Status PENDING   Ethanol     Status: None   Collection Time: 07/11/20  2:05 AM  Result Value Ref Range   Alcohol, Ethyl (B) <10 <10 mg/dL    Comment: (NOTE) Lowest detectable limit for serum alcohol is 10 mg/dL.  For medical purposes only. Performed at [facility], [address] [place], [place] [address]   Resp Panel by RT-PCR (Flu A&B, Covid) Nasopharyngeal Swab     Status: None   Collection Time: 07/11/20  2:39 AM   Specimen: Nasopharyngeal Swab; Nasopharyngeal(NP) swabs in vial transport medium  Result Value Ref Range   SARS Coronavirus 2 by RT PCR NEGATIVE NEGATIVE  Comment: (NOTE) SARS-CoV-2 target nucleic acids are NOT DETECTED.  The SARS-CoV-2 RNA is generally detectable in upper respiratory specimens during the acute phase of infection. The lowest concentration of SARS-CoV-2 viral copies this assay can detect is 138 copies/mL. A negative result does not preclude SARS-Cov-2 infection and should not be used as the sole basis for treatment or other patient management decisions. A negative result may occur with  improper specimen collection/handling, submission of specimen other than nasopharyngeal swab, presence of viral mutation(s) within the areas targeted by this assay, and inadequate number of viral copies(<138 copies/mL). A  negative result must be combined with clinical observations, patient history, and epidemiological information. The expected result is Negative.  Fact Sheet for Patients:  BloggerCourse.com  Fact Sheet for Healthcare Providers:  SeriousBroker.it  This test is no t yet approved or cleared by the [place] FDA and  has been authorized for detection and/or diagnosis of SARS-CoV-2 by FDA under an Emergency Use Authorization (EUA). This EUA will remain  in effect (meaning this test can be used) for the duration of the COVID-19 declaration under Section 564(b)(1) of the Act, 21 U.S.C.section 360bbb-3(b)(1), unless the authorization is terminated  or revoked sooner.       Influenza A by PCR NEGATIVE NEGATIVE   Influenza B by PCR NEGATIVE NEGATIVE    Comment: (NOTE) The Xpert Xpress SARS-CoV-2/FLU/RSV plus assay is intended as an aid in the diagnosis of influenza from Nasopharyngeal swab specimens and should not be used as a sole basis for treatment. Nasal washings and aspirates are unacceptable for Xpert Xpress SARS-CoV-2/FLU/RSV testing.  Fact Sheet for Patients: BloggerCourse.com  Fact Sheet for Healthcare Providers: SeriousBroker.it  This test is not yet approved or cleared by the [place] FDA and has been authorized for detection and/or diagnosis of SARS-CoV-2 by FDA under an Emergency Use Authorization (EUA). This EUA will remain in effect (meaning this test can be used) for the duration of the COVID-19 declaration under Section 564(b)(1) of the Act, 21 U.S.C. section 360bbb-3(b)(1), unless the authorization is terminated or revoked.  Performed at [facility], [address] [place], [place] [address]   Beta-hydroxybutyric acid     Status: Abnormal   Collection Time: 07/11/20  2:57 AM  Result Value Ref Range   Beta-Hydroxybutyric Acid >8.00 (H) 0.05 - 0.27  mmol/L    Comment: RESULTS CONFIRMED BY MANUAL DILUTION Performed at [facility], [address], [place], [place] [address]   Hemoglobin A1c     Status: Abnormal   Collection Time: 07/11/20  2:57 AM  Result Value Ref Range   Hgb A1c MFr Bld 11.6 (H) 4.8 - 5.6 %    Comment: (NOTE)         Prediabetes: 5.7 - 6.4         Diabetes: >6.4         Glycemic control for adults with diabetes: <7.0    Mean Plasma Glucose 286 mg/dL    Comment: (NOTE) Performed At: [facility] [address] [place], [place] [id number] Jolene Schimke MD WG:[id number]   Magnesium     Status: None   Collection Time: 07/11/20  2:57 AM  Result Value Ref Range   Magnesium 1.9 1.7 - 2.4 mg/dL    Comment: Performed at [facility], [address] [place], [place] [address]  Phosphorus     Status: None   Collection Time: 07/11/20  2:57 AM  Result Value Ref Range   Phosphorus  4.1 2.5 - 4.6 mg/dL    Comment: Performed at [facility], [address], [place], [place] [address]  I-Stat beta hCG blood, ED     Status: None   Collection Time: 07/11/20  3:08 AM  Result Value Ref Range   I-stat hCG, quantitative <5.0 <5 mIU/mL   Comment 3            Comment:   GEST. AGE      CONC.  (mIU/mL)   <=1 WEEK        5 - 50     2 WEEKS       50 - 500     3 WEEKS       100 - 10,000     4 WEEKS     1,000 - 30,000        FEMALE AND NON-PREGNANT FEMALE:     LESS THAN 5 mIU/mL   Blood Culture (routine x 2)     Status: None (Preliminary result)   Collection Time: 07/11/20  3:16 AM   Specimen: BLOOD RIGHT WRIST  Result Value Ref Range   Specimen Description BLOOD RIGHT WRIST    Special Requests      BOTTLES DRAWN AEROBIC ONLY Blood Culture results may not be optimal due to an inadequate volume of blood received in culture bottles   Culture      NO GROWTH 1 DAY Performed at [facility], [address], [place], [place] [address]    Report Status PENDING   CBG monitoring, ED     Status:  Abnormal   Collection Time: 07/11/20  3:50 AM  Result Value Ref Range   Glucose-Capillary 539 (HH) 70 - 99 mg/dL    Comment: Glucose reference range applies only to samples taken after fasting for at least 8 hours.  Comprehensive metabolic panel     Status: Abnormal   Collection Time: 07/11/20  3:57 AM  Result Value Ref Range   Sodium 126 (L) 135 - 145 mmol/L   Potassium 3.7 3.5 - 5.1 mmol/L   Chloride 94 (L) 98 - 111 mmol/L   CO2 <7 (L) 22 - 32 mmol/L   Glucose, Bld 594 (HH) 70 - 99 mg/dL    Comment: Glucose reference range applies only to samples taken after fasting for at least 8 hours. CRITICAL RESULT CALLED TO, READ BACK BY AND VERIFIED WITH:  M. ARRINGTON @0534  07/11/20 K. SANDERS    BUN 28 (H) 6 - 20 mg/dL   Creatinine, Ser 07/13/20 (H) 0.44 - 1.00 mg/dL   Calcium 8.5 (L) 8.9 - 10.3 mg/dL   Total Protein 6.8 6.5 - 8.1 g/dL   Albumin 2.6 (L) 3.5 - 5.0 g/dL   AST 14 (L) 15 - 41 U/L   ALT 12 0 - 44 U/L   Alkaline Phosphatase 166 (H) 38 - 126 U/L   Total Bilirubin 1.8 (H) 0.3 - 1.2 mg/dL   GFR, Estimated 41 (L) >60 mL/min    Comment: (NOTE) Calculated using the CKD-EPI Creatinine Equation (2021)    Anion gap NOT CALCULATED 5 - 15    Comment: Performed at [facility], [address] [place], [place]  CBC WITH DIFFERENTIAL     Status: Abnormal   Collection Time: 07/11/20  3:57 AM  Result Value Ref Range   WBC 53.8 (HH) 4.0 - 10.5 K/uL    Comment: REPEATED TO VERIFY WHITE COUNT CONFIRMED ON SMEAR THIS CRITICAL RESULT HAS VERIFIED  AND BEEN CALLED TO J.JASCO,RN BY MELISSA BROGDON ON 06 14 2022 AT 0445, AND HAS BEEN READ BACK.     RBC 3.86 (L) 3.87 - 5.11 MIL/uL   Hemoglobin 10.9 (L) 12.0 - 15.0 g/dL   HCT 46.9 (L) 62.9 - 52.8 %   MCV 93.0 80.0 - 100.0 fL   MCH 28.2 26.0 - 34.0 pg   MCHC 30.4 30.0 - 36.0 g/dL   RDW 41.3 24.4 - 01.0 %   Platelets 579 (H) 150 - 400 K/uL   nRBC 0.0 0.0 - 0.2 %   Neutrophils Relative % 86 %   Neutro Abs 46.3 (H) 1.7 - 7.7 K/uL    Lymphocytes Relative 4 %   Lymphs Abs 2.1 0.7 - 4.0 K/uL   Monocytes Relative 6 %   Monocytes Absolute 3.2 (H) 0.1 - 1.0 K/uL   Eosinophils Relative 0 %   Eosinophils Absolute 0.0 0.0 - 0.5 K/uL   Basophils Relative 0 %   Basophils Absolute 0.0 0.0 - 0.1 K/uL   WBC Morphology INCREASED BANDS (>20% BANDS)     Comment: MILD LEFT SHIFT (1-5% METAS, OCC MYELO, OCC BANDS) VACUOLATED NEUTROPHILS    Immature Granulocytes 4 %   Abs Immature Granulocytes 2.20 (H) 0.00 - 0.07 K/uL   Polychromasia PRESENT     Comment: Performed at [facility], [address] [place], [place] [address]  Protime-INR     Status: Abnormal   Collection Time: 07/11/20  3:57 AM  Result Value Ref Range   Prothrombin Time 17.1 (H) 11.4 - 15.2 seconds   INR 1.4 (H) 0.8 - 1.2    Comment: (NOTE) INR goal varies based on device and disease states. Performed at [facility], [address], [place], [place] [address]   APTT     Status: None   Collection Time: 07/11/20  3:57 AM  Result Value Ref Range   aPTT 25 24 - 36 seconds    Comment: Performed at [facility], [address], [place], [place] [address]  HIV Antibody (routine testing w rflx)     Status: None   Collection Time: 07/11/20  3:57 AM  Result Value Ref Range   HIV Screen 4th Generation wRfx Non Reactive Non Reactive    Comment: Performed at [facility], [address] [place], [place] [address]  Pathologist smear review     Status: None   Collection Time: 07/11/20  3:57 AM  Result Value Ref Range   Path Review Neutrophia with left shift     Comment: Reviewed by Beulah Gandy. Luisa Hart, M.D. 07/11/20 Performed at [facility], [address] [place], [place] [address]   Lactic acid, plasma     Status: Abnormal   Collection Time: 07/11/20  4:15 AM  Result Value Ref Range   Lactic Acid, Venous 3.2 (HH) 0.5 - 1.9 mmol/L    Comment: CRITICAL RESULT CALLED TO, READ BACK BY AND VERIFIED WITH:  M. ARRINGTON  07/11/20 K.  SANDERS Performed at [facility], [address], [place], [place] [address]   I-STAT, [place] 8     Status: Abnormal   Collection Time: 07/11/20  5:00 AM  Result Value Ref Range   Sodium 130 (L) 135 - 145 mmol/L   Potassium 3.1 (L) 3.5 - 5.1 mmol/L   Chloride 103 98 - 111 mmol/L   BUN 27 (H) 6 - 20 mg/dL   Creatinine, Ser 1.88 0.44 - 1.00  mg/dL   Glucose, Bld 384 (HH) 70 - 99 mg/dL    Comment: Glucose reference range applies only to samples taken after fasting for at least 8 hours.   Calcium, Ion 1.31 1.15 - 1.40 mmol/L   TCO2 6 (L) 22 - 32 mmol/L   Hemoglobin 12.6 12.0 - 15.0 g/dL   HCT 66.5 99.3 - 57.0 %  Aerobic/Anaerobic Culture w Gram Stain (surgical/deep wound)     Status: None (Preliminary result)   Collection Time: 07/11/20  5:44 AM   Specimen: Other Source; Tissue  Result Value Ref Range   Specimen Description TISSUE    Special Requests TISSUE FROM LEFT ARM ABSC SPEC A    Gram Stain      RARE WBC PRESENT,BOTH PMN AND MONONUCLEAR ABUNDANT GRAM POSITIVE COCCI IN PAIRS MODERATE GRAM NEGATIVE RODS MODERATE GRAM POSITIVE RODS    Culture      CULTURE REINCUBATED FOR BETTER GROWTH Performed at [facility], [address], [place], [place] [address]    Report Status PENDING   I-STAT, chem 8     Status: Abnormal   Collection Time: 07/11/20  6:21 AM  Result Value Ref Range   Sodium 131 (L) 135 - 145 mmol/L   Potassium 2.6 (LL) 3.5 - 5.1 mmol/L   Chloride 104 98 - 111 mmol/L   BUN 26 (H) 6 - 20 mg/dL   Creatinine, Ser 9.03 0.44 - 1.00 mg/dL   Glucose, Bld 009 (H) 70 - 99 mg/dL    Comment: Glucose reference range applies only to samples taken after fasting for at least 8 hours.   Calcium, Ion 1.34 1.15 - 1.40 mmol/L   TCO2 9 (L) 22 - 32 mmol/L   Hemoglobin 11.6 (L) 12.0 - 15.0 g/dL   HCT 23.3 (L) 00.7 - 62.2 %  Glucose, capillary     Status: Abnormal   Collection Time: 07/11/20  6:55 AM  Result Value Ref Range   Glucose-Capillary 358 (H) 70 - 99 mg/dL     Comment: Glucose reference range applies only to samples taken after fasting for at least 8 hours.  Lactic acid, plasma     Status: Abnormal   Collection Time: 07/11/20  7:23 AM  Result Value Ref Range   Lactic Acid, Venous 2.5 (HH) 0.5 - 1.9 mmol/L    Comment: CRITICAL VALUE NOTED.  VALUE IS CONSISTENT WITH PREVIOUSLY REPORTED AND CALLED VALUE. Performed at [facility], [address], [place], [place] [address]   Glucose, capillary     Status: Abnormal   Collection Time: 07/11/20  7:47 AM  Result Value Ref Range   Glucose-Capillary 340 (H) 70 - 99 mg/dL    Comment: Glucose reference range applies only to samples taken after fasting for at least 8 hours.  Glucose, capillary     Status: Abnormal   Collection Time: 07/11/20  8:51 AM  Result Value Ref Range   Glucose-Capillary 308 (H) 70 - 99 mg/dL    Comment: Glucose reference range applies only to samples taken after fasting for at least 8 hours.  Basic metabolic panel     Status: Abnormal   Collection Time: 07/11/20 10:00 AM  Result Value Ref Range   Sodium 132 (L) 135 - 145 mmol/L   Potassium 2.3 (LL) 3.5 - 5.1 mmol/L    Comment: CRITICAL RESULT CALLED TO, READ BACK BY AND VERIFIED WITH: R.PITTS,RN 07/11/2020 1304 DAVISB    Chloride 105 98 - 111 mmol/L   CO2 12 (L)  22 - 32 mmol/L   Glucose, Bld 247 (H) 70 - 99 mg/dL    Comment: Glucose reference range applies only to samples taken after fasting for at least 8 hours.   BUN 28 (H) 6 - 20 mg/dL   Creatinine, Ser 5.36 (H) 0.44 - 1.00 mg/dL   Calcium 8.4 (L) 8.9 - 10.3 mg/dL   GFR, Estimated >64 >40 mL/min    Comment: (NOTE) Calculated using the CKD-EPI Creatinine Equation (2021)    Anion gap 15 5 - 15    Comment: Performed at [facility], [address] [place], [place] [address]  CBC     Status: Abnormal   Collection Time: 07/11/20 10:00 AM  Result Value Ref Range   WBC 34.0 (H) 4.0 - 10.5 K/uL   RBC 3.54 (L) 3.87 - 5.11 MIL/uL   Hemoglobin 9.9 (L) 12.0 - 15.0  g/dL   HCT 59.5 (L) 63.8 - 75.6 %   MCV 86.4 80.0 - 100.0 fL    Comment: REPEATED TO VERIFY DELTA CHECK NOTED    MCH 28.0 26.0 - 34.0 pg   MCHC 32.4 30.0 - 36.0 g/dL   RDW 43.3 29.5 - 18.8 %   Platelets PLATELET CLUMPS NOTED ON SMEAR, UNABLE TO ESTIMATE 150 - 400 K/uL    Comment: REPEATED TO VERIFY   nRBC 0.0 0.0 - 0.2 %    Comment: Performed at [facility], [address] [place], [place] [address]  Magnesium     Status: Abnormal   Collection Time: 07/11/20 10:00 AM  Result Value Ref Range   Magnesium 1.5 (L) 1.7 - 2.4 mg/dL    Comment: Performed at [facility], [address], [place], [place] [address]  Phosphorus     Status: Abnormal   Collection Time: 07/11/20 10:00 AM  Result Value Ref Range   Phosphorus <1.0 (LL) 2.5 - 4.6 mg/dL    Comment: CRITICAL RESULT CALLED TO, READ BACK BY AND VERIFIED WITH: R.PITTS,RN 07/11/2020 1304 DAVISB Performed at [facility], [address] [place], [place] [address]   Glucose, capillary     Status: Abnormal   Collection Time: 07/11/20 10:09 AM  Result Value Ref Range   Glucose-Capillary 282 (H) 70 - 99 mg/dL    Comment: Glucose reference range applies only to samples taken after fasting for at least 8 hours.  MRSA Next Gen by PCR, Nasal     Status: None   Collection Time: 07/11/20 10:11 AM  Result Value Ref Range   MRSA by PCR Next Gen NOT DETECTED NOT DETECTED    Comment: (NOTE) The GeneXpert MRSA Assay (FDA approved for NASAL specimens only), is one component of a comprehensive MRSA colonization surveillance program. It is not intended to diagnose MRSA infection nor to guide or monitor treatment for MRSA infections. Test performance is not FDA approved in patients less than 82 years old. Performed at [facility], [address], [place], [place] [address]   Beta-hydroxybutyric acid     Status: Abnormal   Collection Time: 07/11/20 10:57 AM  Result Value Ref Range   Beta-Hydroxybutyric Acid 3.61 (H)  0.05 - 0.27 mmol/L    Comment: Performed at [facility], [address] [place], [place] [address]  Glucose, capillary     Status: Abnormal   Collection Time: 07/11/20 11:05 AM  Result Value Ref Range   Glucose-Capillary 216 (H) 70 - 99 mg/dL    Comment: Glucose reference range  applies only to samples taken after fasting for at least 8 hours.  Glucose, capillary     Status: Abnormal   Collection Time: 07/11/20 12:14 PM  Result Value Ref Range   Glucose-Capillary 212 (H) 70 - 99 mg/dL    Comment: Glucose reference range applies only to samples taken after fasting for at least 8 hours.  Glucose, capillary     Status: Abnormal   Collection Time: 07/11/20  1:20 PM  Result Value Ref Range   Glucose-Capillary 187 (H) 70 - 99 mg/dL    Comment: Glucose reference range applies only to samples taken after fasting for at least 8 hours.  Glucose, capillary     Status: Abnormal   Collection Time: 07/11/20  2:24 PM  Result Value Ref Range   Glucose-Capillary 192 (H) 70 - 99 mg/dL    Comment: Glucose reference range applies only to samples taken after fasting for at least 8 hours.  Basic metabolic panel     Status: Abnormal   Collection Time: 07/11/20  3:11 PM  Result Value Ref Range   Sodium 130 (L) 135 - 145 mmol/L   Potassium 2.9 (L) 3.5 - 5.1 mmol/L    Comment: NO VISIBLE HEMOLYSIS   Chloride 106 98 - 111 mmol/L   CO2 17 (L) 22 - 32 mmol/L   Glucose, Bld 199 (H) 70 - 99 mg/dL    Comment: Glucose reference range applies only to samples taken after fasting for at least 8 hours.   BUN 24 (H) 6 - 20 mg/dL   Creatinine, Ser 1.61 0.44 - 1.00 mg/dL   Calcium 8.3 (L) 8.9 - 10.3 mg/dL   GFR, Estimated >09 >60 mL/min    Comment: (NOTE) Calculated using the CKD-EPI Creatinine Equation (2021)    Anion gap 7 5 - 15    Comment: Performed at [facility], [address], [place], [place] [address]  Glucose, capillary     Status: Abnormal   Collection Time: 07/11/20  3:24 PM  Result  Value Ref Range   Glucose-Capillary 176 (H) 70 - 99 mg/dL    Comment: Glucose reference range applies only to samples taken after fasting for at least 8 hours.  Glucose, capillary     Status: Abnormal   Collection Time: 07/11/20  4:43 PM  Result Value Ref Range   Glucose-Capillary 164 (H) 70 - 99 mg/dL    Comment: Glucose reference range applies only to samples taken after fasting for at least 8 hours.  Glucose, capillary     Status: Abnormal   Collection Time: 07/11/20  5:50 PM  Result Value Ref Range   Glucose-Capillary 165 (H) 70 - 99 mg/dL    Comment: Glucose reference range applies only to samples taken after fasting for at least 8 hours.  Glucose, capillary     Status: Abnormal   Collection Time: 07/11/20  6:58 PM  Result Value Ref Range   Glucose-Capillary 214 (H) 70 - 99 mg/dL    Comment: Glucose reference range applies only to samples taken after fasting for at least 8 hours.  Glucose, capillary     Status: Abnormal   Collection Time: 07/11/20  7:57 PM  Result Value Ref Range   Glucose-Capillary 213 (H) 70 - 99 mg/dL    Comment: Glucose reference range applies only to samples taken after fasting for at least 8 hours.  Glucose, capillary     Status: Abnormal   Collection Time: 07/11/20  8:57 PM  Result Value Ref Range  Glucose-Capillary 226 (H) 70 - 99 mg/dL    Comment: Glucose reference range applies only to samples taken after fasting for at least 8 hours.  Glucose, capillary     Status: Abnormal   Collection Time: 07/11/20 10:06 PM  Result Value Ref Range   Glucose-Capillary 198 (H) 70 - 99 mg/dL    Comment: Glucose reference range applies only to samples taken after fasting for at least 8 hours.  Basic metabolic panel     Status: Abnormal   Collection Time: 07/11/20 10:11 PM  Result Value Ref Range   Sodium 132 (L) 135 - 145 mmol/L   Potassium 2.9 (L) 3.5 - 5.1 mmol/L   Chloride 106 98 - 111 mmol/L   CO2 18 (L) 22 - 32 mmol/L   Glucose, Bld 206 (H) 70 - 99 mg/dL     Comment: Glucose reference range applies only to samples taken after fasting for at least 8 hours.   BUN 21 (H) 6 - 20 mg/dL   Creatinine, Ser 6.30 0.44 - 1.00 mg/dL   Calcium 8.0 (L) 8.9 - 10.3 mg/dL   GFR, Estimated >16 >01 mL/min    Comment: (NOTE) Calculated using the CKD-EPI Creatinine Equation (2021)    Anion gap 8 5 - 15    Comment: Performed at [facility], [address] [place], [place] [address]  Phosphorus     Status: Abnormal   Collection Time: 07/11/20 10:11 PM  Result Value Ref Range   Phosphorus 1.7 (L) 2.5 - 4.6 mg/dL    Comment: Performed at [facility], [address], [place], [place] [address]  Glucose, capillary     Status: Abnormal   Collection Time: 07/11/20 11:02 PM  Result Value Ref Range   Glucose-Capillary 185 (H) 70 - 99 mg/dL    Comment: Glucose reference range applies only to samples taken after fasting for at least 8 hours.  Glucose, capillary     Status: Abnormal   Collection Time: 07/12/20 12:08 AM  Result Value Ref Range   Glucose-Capillary 176 (H) 70 - 99 mg/dL    Comment: Glucose reference range applies only to samples taken after fasting for at least 8 hours.  Glucose, capillary     Status: Abnormal   Collection Time: 07/12/20  1:07 AM  Result Value Ref Range   Glucose-Capillary 148 (H) 70 - 99 mg/dL    Comment: Glucose reference range applies only to samples taken after fasting for at least 8 hours.  Glucose, capillary     Status: Abnormal   Collection Time: 07/12/20  2:04 AM  Result Value Ref Range   Glucose-Capillary 161 (H) 70 - 99 mg/dL    Comment: Glucose reference range applies only to samples taken after fasting for at least 8 hours.  Vancomycin, random     Status: None   Collection Time: 07/12/20  2:31 AM  Result Value Ref Range   Vancomycin Rm 6     Comment:        Random Vancomycin therapeutic range is dependent on dosage and time of specimen collection. A peak range is 20.0-40.0 ug/mL A trough range is  5.0-15.0 ug/mL        Performed at [facility], [address] [place], [place] [address]   Beta-hydroxybutyric acid     Status: Abnormal   Collection Time: 07/12/20  2:31 AM  Result Value Ref Range   Beta-Hydroxybutyric Acid 0.41 (H) 0.05 - 0.27 mmol/L    Comment:  Performed at [facility], [address], [place], [place] [address]  Glucose, capillary     Status: Abnormal   Collection Time: 07/12/20  4:05 AM  Result Value Ref Range   Glucose-Capillary 191 (H) 70 - 99 mg/dL    Comment: Glucose reference range applies only to samples taken after fasting for at least 8 hours.  Basic metabolic panel     Status: Abnormal   Collection Time: 07/12/20  4:53 AM  Result Value Ref Range   Sodium 133 (L) 135 - 145 mmol/L   Potassium 3.8 3.5 - 5.1 mmol/L    Comment: DELTA CHECK NOTED NO VISIBLE HEMOLYSIS    Chloride 105 98 - 111 mmol/L   CO2 19 (L) 22 - 32 mmol/L   Glucose, Bld 209 (H) 70 - 99 mg/dL    Comment: Glucose reference range applies only to samples taken after fasting for at least 8 hours.   BUN 17 6 - 20 mg/dL   Creatinine, Ser 6.04 0.44 - 1.00 mg/dL   Calcium 7.7 (L) 8.9 - 10.3 mg/dL   GFR, Estimated >54 >09 mL/min    Comment: (NOTE) Calculated using the CKD-EPI Creatinine Equation (2021)    Anion gap 9 5 - 15    Comment: Performed at [facility], [address], [place], [place] [address]  Magnesium     Status: None   Collection Time: 07/12/20  4:53 AM  Result Value Ref Range   Magnesium 1.7 1.7 - 2.4 mg/dL    Comment: Performed at [facility], [address] [place], [place] [address]  Phosphorus     Status: None   Collection Time: 07/12/20  4:53 AM  Result Value Ref Range   Phosphorus 3.0 2.5 - 4.6 mg/dL    Comment: Performed at [facility], [address] [place], [place] [address]  Glucose, capillary     Status: Abnormal   Collection Time: 07/12/20  5:07 AM  Result Value Ref Range   Glucose-Capillary 170 (H) 70 - 99 mg/dL     Comment: Glucose reference range applies only to samples taken after fasting for at least 8 hours.  Glucose, capillary     Status: Abnormal   Collection Time: 07/12/20  6:12 AM  Result Value Ref Range   Glucose-Capillary 186 (H) 70 - 99 mg/dL    Comment: Glucose reference range applies only to samples taken after fasting for at least 8 hours.  Glucose, capillary     Status: Abnormal   Collection Time: 07/12/20  7:25 AM  Result Value Ref Range   Glucose-Capillary 186 (H) 70 - 99 mg/dL    Comment: Glucose reference range applies only to samples taken after fasting for at least 8 hours.  Glucose, capillary     Status: Abnormal   Collection Time: 07/12/20  8:48 AM  Result Value Ref Range   Glucose-Capillary 172 (H) 70 - 99 mg/dL    Comment: Glucose reference range applies only to samples taken after fasting for at least 8 hours.  Glucose, capillary     Status: Abnormal   Collection Time: 07/12/20  9:48 AM  Result Value Ref Range   Glucose-Capillary 151 (H) 70 - 99 mg/dL    Comment: Glucose reference range applies only to samples taken after fasting for at least 8 hours.  Glucose, capillary     Status: Abnormal   Collection Time: 07/12/20 11:10 AM  Result Value Ref Range   Glucose-Capillary 138 (H)  70 - 99 mg/dL    Comment: Glucose reference range applies only to samples taken after fasting for at least 8 hours.  Phosphorus     Status: Abnormal   Collection Time: 07/12/20 11:17 AM  Result Value Ref Range   Phosphorus 1.1 (L) 2.5 - 4.6 mg/dL    Comment: Performed at [facility], [address], [place], [place] [address]  Beta-hydroxybutyric acid     Status: None   Collection Time: 07/12/20 11:17 AM  Result Value Ref Range   Beta-Hydroxybutyric Acid 0.22 0.05 - 0.27 mmol/L    Comment: Performed at [facility], [address] [place], [place] [address]  Glucose, capillary     Status: Abnormal   Collection Time: 07/12/20 12:07 PM  Result Value Ref Range    Glucose-Capillary 133 (H) 70 - 99 mg/dL    Comment: Glucose reference range applies only to samples taken after fasting for at least 8 hours.  Glucose, capillary     Status: Abnormal   Collection Time: 07/12/20  3:02 PM  Result Value Ref Range   Glucose-Capillary 185 (H) 70 - 99 mg/dL    Comment: Glucose reference range applies only to samples taken after fasting for at least 8 hours.    DG Chest Port 1 View  Result Date: 07/11/2020 CLINICAL DATA:  Line placement EXAM: PORTABLE CHEST 1 VIEW COMPARISON:  07/11/2020 FINDINGS: Right central line tip in the SVC. No pneumothorax. Heart and mediastinal contours are within normal limits. No focal opacities or effusions. No acute bony abnormality. IMPRESSION: Right central line tip in the SVC.  No pneumothorax. No active cardiopulmonary disease. Electronically Signed   By: Charlett Nose M.D.   On: 07/11/2020 03:53   DG Chest Port 1 View  Result Date: 07/11/2020 CLINICAL DATA:  Possible sepsis, hyperglycemia, heroin use EXAM: PORTABLE CHEST 1 VIEW COMPARISON:  Radiograph 02/19/2012 FINDINGS: No consolidation, features of edema, pneumothorax, or effusion. Pulmonary vascularity is normally distributed. The cardiomediastinal contours are unremarkable. No acute osseous or soft tissue abnormality. 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 Telemetry leads overlie the chest. IMPRESSION: No acute cardiopulmonary abnormality. Electronically Signed   By: Kreg Shropshire M.D.   On: 07/11/2020 02:31   DG Humerus Left  Result Date: 07/11/2020 CLINICAL DATA:  Hyperglycemic, has not recently taken diabetes medications. Reports broken IV needle in the upper arm 1 week prior. 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 EXAM: LEFT HUMERUS - 2+ VIEW COMPARISON:  None. FINDINGS: There is extensive circumferential soft tissue swelling of the upper arm with the large volume of soft tissue gas predominantly within the anteromedial soft tissues. A tiny linear 6 mm foreign  body is seen in anterior soft tissues of the distal upper arm compatible with reported broken needle. No acute fracture, traumatic malalignment, suspicious osseous lesions or radiographic features of osteomyelitis seen at this time. IMPRESSION: Extensive soft tissue swelling and edematous changes with widespread soft tissue gas in the anteromedial upper arm concerning for an aggressive soft tissue infection with gas-forming organism. Emergent surgical assessment is warranted. Tiny 6 mm linear radiodense foreign body in the distal anterior aspect of the upper arm compatible with reported foreign body (broken intravenous needle tip) Critical Value/emergent results were called by telephone at the time of interpretation on 07/11/2020 at 2:30 am to provider Zadie Rhine , who verbally acknowledged these results. Electronically Signed   By: Kreg Shropshire M.D.   On: 07/11/2020 02:30   DG Hand Complete Left  Result Date: 07/11/2020 CLINICAL DATA:  Swelling, redness on posterior hand.  Pain EXAM: LEFT HAND - COMPLETE 3+ VIEW COMPARISON:  None. FINDINGS: There is no evidence of fracture or dislocation. There is no evidence of arthropathy or other focal bone abnormality. Soft tissues are unremarkable. No radiopaque foreign body. IMPRESSION: Negative. Electronically Signed   By: Charlett Nose M.D.   On: 07/11/2020 03:54   DG MINI C-ARM IMAGE ONLY  Result Date: 07/11/2020 There is no interpretation for this exam.  This order is for images obtained during a surgical procedure.  Please See "Surgeries" Tab for more information regarding the procedure.   ECHOCARDIOGRAM COMPLETE  Result Date: 07/11/2020    ECHOCARDIOGRAM REPORT   Patient Name:   OLIVEA SONNEN Date of Exam: 07/11/2020 Medical Rec #:  [id number]       Height:       67.0 in Accession #:    [id number]      Weight:       185.2 lb Date of Birth:  [birth year]       BSA:          1.957 m Patient Age:    29 years        BP:           120/73 mmHg Patient Gender: F                HR:           111 bpm. Exam Location:  Inpatient Procedure: 2D Echo, Cardiac Doppler and Color Doppler Indications:    Bacteremia  History:        Patient has no prior history of Echocardiogram examinations.  Sonographer:    Roosvelt Maser RDCS Referring Phys: [id number] Tobey Grim IMPRESSIONS  1. Mildly elevated LVOT velocity (2.2 m/s) possibly related to vigorous LV function; visually aortic valve opens well.  2. Left ventricular ejection fraction, by estimation, is 70 to 75%. The left ventricle has hyperdynamic function. The left ventricle has no regional wall motion abnormalities. Left ventricular diastolic parameters were normal.  3. Right ventricular systolic function is normal. The right ventricular size is normal.  4. The mitral valve is normal in structure. Trivial mitral valve regurgitation. No evidence of mitral stenosis.  5. The aortic valve is tricuspid. Aortic valve regurgitation is not visualized. No aortic stenosis is present.  6. The inferior vena cava is normal in size with greater than 50% respiratory variability, suggesting right atrial pressure of 3 mmHg. FINDINGS  Left Ventricle: Left ventricular ejection fraction, by estimation, is 70 to 75%. The left ventricle has hyperdynamic function. The left ventricle has no regional wall motion abnormalities. The left ventricular internal cavity size was normal in size. There is no left ventricular hypertrophy. Left ventricular diastolic parameters were normal. Right Ventricle: The right ventricular size is normal. Right ventricular systolic function is normal. Left Atrium: Left atrial size was normal in size. Right Atrium: Right atrial size was normal in size. Pericardium: Trivial pericardial effusion is present. Mitral Valve: The mitral valve is normal in structure. Trivial mitral valve regurgitation. No evidence of mitral valve stenosis. Tricuspid Valve: The tricuspid valve is normal in structure. Tricuspid valve regurgitation is trivial.  No evidence of tricuspid stenosis. Aortic Valve: The aortic valve is tricuspid. Aortic valve regurgitation is not visualized. No aortic stenosis is present. Aortic valve mean gradient measures 10.0 mmHg. Aortic valve peak gradient measures 19.2 mmHg. Aortic valve area, by VTI measures 1.84 cm. Pulmonic Valve: The pulmonic valve  was normal in structure. Pulmonic valve regurgitation is not visualized. No evidence of pulmonic stenosis. Aorta: The aortic root is normal in size and structure. Venous: The inferior vena cava is normal in size with greater than 50% respiratory variability, suggesting right atrial pressure of 3 mmHg. IAS/Shunts: No atrial level shunt detected by color flow Doppler. Additional Comments: Mildly elevated LVOT velocity (2.2 m/s) possibly related to vigorous LV function; visually aortic valve opens well.  LEFT VENTRICLE PLAX 2D LVIDd:         4.00 cm  Diastology LVIDs:         2.60 cm  LV e' medial:    11.20 cm/s LV PW:         0.80 cm  LV E/e' medial:  8.7 LV IVS:        0.70 cm  LV e' lateral:   11.50 cm/s LVOT diam:     1.70 cm  LV E/e' lateral: 8.5 LV SV:         50 LV SV Index:   26 LVOT Area:     2.27 cm  RIGHT VENTRICLE          IVC RV Basal diam:  3.50 cm  IVC diam: 1.70 cm LEFT ATRIUM             Index       RIGHT ATRIUM           Index LA diam:        2.70 cm 1.38 cm/m  RA Area:     14.40 cm LA Vol (A2C):   62.0 ml 31.68 ml/m RA Volume:   35.20 ml  17.99 ml/m LA Vol (A4C):   39.2 ml 20.03 ml/m LA Biplane Vol: 51.2 ml 26.16 ml/m  AORTIC VALVE AV Area (Vmax):    1.77 cm AV Area (Vmean):   1.67 cm AV Area (VTI):     1.84 cm AV Vmax:           219.00 cm/s AV Vmean:          140.000 cm/s AV VTI:            0.271 m AV Peak Grad:      19.2 mmHg AV Mean Grad:      10.0 mmHg LVOT Vmax:         171.00 cm/s LVOT Vmean:        103.000 cm/s LVOT VTI:          0.220 m LVOT/AV VTI ratio: 0.81  AORTA Ao Root diam: 2.80 cm MITRAL VALVE MV Area (PHT): 4.10 cm    SHUNTS MV Decel Time: 185 msec     Systemic VTI:  0.22 m MV E velocity: 97.70 cm/s  Systemic Diam: 1.70 cm MV A velocity: 74.90 cm/s MV E/A ratio:  1.30 Olga Millers MD Electronically signed by Olga Millers MD Signature Date/Time: 07/11/2020/6:48:30 PM    Final    CT Extrem Up Entire Arm L WO/W CM  Result Date: 07/11/2020 CLINICAL DATA:  30 year old female with left upper extremity pain swelling and redness. Extensive soft tissue gas about the humerus on x-rays, with possible tiny metallic needle retained foreign body. EXAM: CT OF THE UPPER LEFT EXTREMITY WITHOUT AND WITH CONTRAST TECHNIQUE: Multidetector CT imaging of the upper left extremity was performed following the standard protocol before and during bolus administration of intravenous contrast. COMPARISON:  Left humerus radiographs 0216 hours. CONTRAST:  OMNIPAQUE IOHEXOL 300 MG/ML  SOLN FINDINGS: Study is moderately  degraded by motion artifact. Subtle, linear roughly 8-9 mm hyperdense foreign body suspected in the ventral subcutaneous soft tissues on series 4, image 172 corresponding to the radiographic finding. This is 4 mm deep to the skin surface. Large volume of subcutaneous gas throughout the ventral upper arm, tracking medially more so than laterally. Superimposed diffuse subcutaneous edema. No organized fluid collection identified. The soft tissue abnormality extends to the ventral muscle layer, and some ventral myositis is suspected (for example on series 4, image 103 at the mid humerus shaft level). IV contrast bolus is insufficient for detailed vascular evaluation. There are small but reactive appearing left axillary lymph nodes individually up to 14 mm short axis. Normal alignment of osseous structures at the visible left shoulder. Motion artifact degrades bone detail. And there is suspicious osteopenia of the ventral humerus cortex, for example on series 3, image 120. But no discrete cortical osteolysis. Alignment appears maintained at the left elbow, and no elbow  joint effusion is identified. Grossly negative visible left chest and upper abdomen. IMPRESSION: 1. Moderately degraded by motion. 2. 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 3. Subtle retained metallic foreign body, possible needle fragment, located 4 mm deep to the skin surface on series 4, image 172. 4. No organized fluid collection or abscess identified. 5. Limited evaluation for humerus osteomyelitis due to motion, but there is suspicious osteopenia of the ventral humeral cortex. 6. Reactive right axillary lymph nodes. Electronically Signed   By: Odessa Fleming M.D.   On: 07/11/2020 04:56    Review of Systems  Constitutional:  Negative for chills and fever.  Respiratory:  Negative for shortness of breath.   Cardiovascular:  Negative for chest pain.  Gastrointestinal: Negative.   Neurological:  Positive for weakness.  Blood pressure 130/88, pulse (!) 112, temperature 98.5 F (36.9 C), temperature source Oral, resp. rate 15, height 5\' 7"  (1.702 m), weight 84 kg, SpO2 99 %. Physical Exam Constitutional:      General: She is not in acute distress.    Appearance: She is ill-appearing. She is not toxic-appearing.  HENT:     Head: Normocephalic and atraumatic.     Mouth/Throat:     Comments: Poor dentition  Pulmonary:     Effort: Pulmonary effort is normal.  Musculoskeletal:     Left upper arm: Swelling present.       Arms:     Comments: Left arm with wound vac in place, surrounding erythema noted. TTP. Good wound vac seal noted. 350 cc serosanguinous drainage in canister. No crepitus with palpation. No fluid collection noted with palpation of wound bed periphery. Decreased ROM at L elbow, some flexion possible.  Poor flexion and extension of left wrist. Palpable radial pulse. Is able to make a partial fist. Swelling of wrist/hand/forearm noted, compressible compartments. No pain with palpation of forearm/wrist.  Neurological:     General: No focal deficit present.     Mental  Status: She is alert.  Psychiatric:        Mood and Affect: Mood normal.    Assessment/Plan:  Orthopedics planning to take patient back to OR for addtl debridement and vac change.  We remain available as needed for assistance with coverage.  Kermit Balo Hanley Rispoli, PA-C 07/12/2020, 3:54 PM

## 2020-07-12 NOTE — Progress Notes (Signed)
S: Patient is awake and communicative today. She continues treatment of her infection. She notes she has pain and difficulty with wrist and digital extension.  O: Wound vac is attached with good contact and effective suction. Forearm is now soft and supple with less swelling. Light touch in ulnar and radial nerve distribution intact. Patient unable to actively extend the wrist and digital extensors. Good radial pulse palpable. Fingers are warm and well profused.  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  Due to pt extremis and mental status changes prior to surgery I am unable to comment on whether this occurred before or after surgery but given the extent of her infection I would not be surprised if this was present preoperatively.  She does have intact sensation which is encouraging.  We will order a cockup wrist splint to avoid contracture.  I did discuss her case with Dr. Foster Simpson with plastic surgery and she has agreed that her or her partner will see the patient and assist with ultimate coverage.  I will plan for return to the OR tomorrow for repeat irrigation and wound VAC change.  NPO past midnight.

## 2020-07-12 NOTE — Progress Notes (Signed)
NAME:  Anna Dennis, MRN:  [id number], DOB:  11/03/1990, LOS: 1 ADMISSION DATE:  07/11/2020, CONSULTATION DATE:  07/11/2020 REFERRING MD:  Dr. Christy Gentles, CHIEF COMPLAINT:  DKA, Sepsis   History of Present Illness:  30 year old female presents to ED on 6/14 with weakness, hyperglycemia, and left arm pain. Patient reports that she had a needle "snap" off into her arm a week ago, since this time has experienced swelling and pain, further reports not taken her insulin in over a week after running out. On arrival to ED HR 133. BP 144/86. Glucose 598. Patient with noted large abscess with necrotic center to left bicep, surrounding area red and warm. Orthopedics consulted plans to take to OR for management. Critical Care Consulted for admission.   Pertinent  Medical History  Type 1 DM, Heroin Abuse, Hep C, Depression  Significant Hospital Events: Including procedures, antibiotic start and stop dates in addition to other pertinent events   6/14 Presents to ED. Taken to OR.   Interim History / Subjective:  6/15: awake and conversant. "Arm hurts", wants to eat. Remains on insulin gtt but has met criteria for transition off insulin gtt.   Objective   Blood pressure 130/81, pulse (!) 114, temperature 99 F (37.2 C), temperature source Oral, resp. rate 13, height _0  (1.702 m), weight 84 kg, SpO2 100 %.        Intake/Output Summary (Last 24 hours) at 07/12/2020 1006 Last data filed at 07/12/2020 0700 Gross per 24 hour  Intake 3980.26 ml  Output 2080 ml  Net 1900.26 ml   Filed Weights   07/11/20 0202  Weight: 84 kg    Examination: General: Adult female, appears acutely ill HENT: NCAT, EOMI, pERRLA, Dry MM but pink Lungs: ctab Cardiovascular: Tachy, HR 122 Abdomen: non-distended  Extremities: LUE swollen with wound vac in place Neuro: awake but drowsy, conversant, follows commands GU: deferred   Labs/imaging that I havepersonally reviewed  (right click and "Reselect all SmartList  Selections" daily)  XR Left Humerus 6/14 > Extensive soft tissue swelling and edematous changes with widespread soft tissue gas in the anteromedial upper arm concerning for an aggressive soft tissue infection with gas-forming organism. Emergent surgical assessment is warranted. Tiny 6 mm linear radiodense foreign body in the distal anterior aspect of the upper arm compatible with reported foreign body (broken intravenous needle tip) CXR 6/14 > no acute   Resolved Hospital Problem list     Assessment & Plan:   Sepsis secondary to left arm abscess/Nec Fascitis > Xray with needle in distal/anterior aspect of upper arm  Plan  -Ortho appreciated, wound vac in place -PAN Culture  -Start Zosyn, Clindamycin, Vancomycin  -ECHO normal  Anion Gap Metabolic Acidosis, Lactic Acidosis, Acute Injury, multifactorial with DKA and Sepsis  Plan -Trend BMP -Continue to Hydrate   -lactate improved  DKA H/O Type 1 DM Plan -DKA protocol, resolving so will transition to subcut -cont to replace e-lytes  Heroin Abuse  Plan -Monitor for withdrawal symptoms  -UDS (not obtained)  -ETOH (negative) -HIV negative   Best practice (right click and "Reselect all SmartList Selections" daily)  Diet:  Oral Pain/Anxiety/Delirium protocol (if indicated): No VAP protocol (if indicated): Not indicated DVT prophylaxis: Subcutaneous Heparin GI prophylaxis: PPI Glucose control:  Insulin gtt, SSI Yes, and Basal insulin Yes Central venous access:  Yes, and it is still needed Arterial line:  N/A Foley:  N/A Mobility:  bed rest  PT consulted: N/A Last date of multidisciplinary goals of  care discussion [Pending] Code Status:  full code Disposition: Admit to ICU   Labs   CBC: Recent Labs  Lab 07/11/20 0357 07/11/20 0500 07/11/20 0621 07/11/20 1000  WBC 53.8*  --   --  34.0*  NEUTROABS 46.3*  --   --   --   HGB 10.9* 12.6 11.6* 9.9*  HCT 35.9* 37.0 34.0* 30.6*  MCV 93.0  --   --  86.4  PLT 579*  --   --   PLATELET CLUMPS NOTED ON SMEAR, UNABLE TO ESTIMATE    Basic Metabolic Panel: Recent Labs  Lab 07/11/20 0257 07/11/20 0357 07/11/20 0500 07/11/20 0621 07/11/20 1000 07/11/20 1511 07/11/20 2211 07/12/20 0453  NA  --  126*   < > 131* 132* 130* 132* 133*  K  --  3.7   < > 2.6* 2.3* 2.9* 2.9* 3.8  CL  --  94*   < > 104 105 106 106 105  CO2  --  <7*  --   --  12* 17* 18* 19*  GLUCOSE  --  594*   < > 405* 247* 199* 206* 209*  BUN  --  28*   < > 26* 28* 24* 21* 17  CREATININE  --  1.68*   < > 0.80 1.19* 0.88 0.76 0.67  CALCIUM  --  8.5*  --   --  8.4* 8.3* 8.0* 7.7*  MG 1.9  --   --   --  1.5*  --   --  1.7  PHOS 4.1  --   --   --  <1.0*  --  1.7* 3.0   < > = values in this interval not displayed.   GFR: Estimated Creatinine Clearance: 113.6 mL/min (by C-G formula based on SCr of 0.67 mg/dL). Recent Labs  Lab 07/11/20 0357 07/11/20 0415 07/11/20 0723 07/11/20 1000  WBC 53.8*  --   --  34.0*  LATICACIDVEN  --  3.2* 2.5*  --     Liver Function Tests: Recent Labs  Lab 07/11/20 0357  AST 14*  ALT 12  ALKPHOS 166*  BILITOT 1.8*  PROT 6.8  ALBUMIN 2.6*   No results for input(s): LIPASE, AMYLASE in the last 168 hours. No results for input(s): AMMONIA in the last 168 hours.  ABG    Component Value Date/Time   HCO3 15.0 (L) 07/25/2016 0941   TCO2 9 (L) 07/11/2020 0621   ACIDBASEDEF 9.0 (H) 07/25/2016 0941   O2SAT 98.0 07/25/2016 0941     Coagulation Profile: Recent Labs  Lab 07/11/20 0357  INR 1.4*    Cardiac Enzymes: No results for input(s): CKTOTAL, CKMB, CKMBINDEX, TROPONINI in the last 168 hours.  HbA1C: Hgb A1c MFr Bld  Date/Time Value Ref Range Status  07/11/2020 02:57 AM 11.6 (H) 4.8 - 5.6 % Final    Comment:    (NOTE)         Prediabetes: 5.7 - 6.4         Diabetes: >6.4         Glycemic control for adults with diabetes: <7.0   09/04/2015 11:24 PM 13.7 (H) 4.8 - 5.6 % Final    Comment:    (NOTE)         Pre-diabetes: 5.7 - 6.4          Diabetes: >6.4         Glycemic control for adults with diabetes: <7.0     CBG: Recent Labs  Lab 07/12/20 0507 07/12/20 0612 07/12/20 0725  07/12/20 0848 07/12/20 0948  GLUCAP 170* 186* 186* 172* 151*    Critical care time: The patient is critically ill with multiple organ systems failure and requires high complexity decision making for assessment and support, frequent evaluation and titration of therapies, application of advanced monitoring technologies and extensive interpretation of multiple databases.  Critical care time 32 mins. This represents my time independent of the NPs time taking care of the pt. This is excluding procedures.    Audria Nine DO [facility] 07/12/2020, 10:06 AM See Amion for pager If no response to pager, please call [phone] until 1900 After 1900 please call [facility]

## 2020-07-12 NOTE — Progress Notes (Signed)
eLink Physician-Brief Progress Note Patient Name: Anna Dennis DOB: Jun 21, 1990 MRN: [id number]   Date of Service  07/12/2020  HPI/Events of Note  Multiple isses: 1. Patient caught on camera attempting to snort Heroin. Nursing request for safety sitter. and 2. Blood glucose = 289. Currently eating a diet and on D5 LR. Due to get Levemir 25 units [name] at 10 PM.  eICU Interventions  Plan: 1 to 1 safety sitter.  Novolog 8 units [place] now. Continue Q 4 hour SSI.     Intervention Category Major Interventions: Other:  [name] 07/12/2020, 8:50 PM

## 2020-07-12 NOTE — Progress Notes (Addendum)
Pharmacy Electrolyte Replacement  Recent Labs:  Recent Labs    07/12/20 0453  K 3.8  MG 1.7  PHOS 3.0  CREATININE 0.67    Low Critical Values (K </= 2.5, Phos </= 1, Mg </= 1) Present: None  Mg still low at 1.7 despite 4g replacement yesterday for Mg 1.5.   Plan:  - Mg 4g IV x 1, repeat Mg with AM labs - K 3.8 - still completing repletion from PM labs, no additional for now  Addendum: Phos rechecked after repletion completed and still low at 1.1 - will give KPhos 45 mmol x 1, recheck K/Mg/Phos at 2200  Georgina Pillion, PharmD, BCPS Pager: [phone] 12:49 PM    Thank you for allowing pharmacy to be a part of this patient's care.  Georgina Pillion, PharmD, BCPS Clinical Pharmacist Clinical phone for 07/12/2020: [phone] 07/12/2020 7:19 AM   **Pharmacist phone directory can now be found on [email or web address] ([email or web address]).  Listed under [facility].

## 2020-07-12 NOTE — Progress Notes (Addendum)
Pharmacy Antibiotic Note  Anna Dennis is a 30 y.o. female admitted on 07/11/2020 with sepsis due to necrotizing soft tissue infection after leaving a broken needle in her arm x1wk.  Pharmacy has been consulted for vancomycin and Zosyn dosing.  AKI resolved.  Yesterday am pt inadvertently received a vanc LD in ED and then a periop dose in the OR for total dose of 2500mg ; expected high random level but vanc level is low <24h after dose.  Plan: Vancomycin 1500mg  IV Q12H. Goal AUC 400-550.  Expected AUC 540.  SCr 0.76.  Zosyn 3.375g IV Q8H (4-hour infusion).  Addendum - will adjust the dose slightly:  - Vancomycin 1500 mg IV x 1 now followed by 1250 mg IV q12h (eAUC 477, SCr rounded to 0.8, Vd 0.72) - Continue Zosyn 3.375g EI q8h - Clinda per MD  , PharmD, BCPS Clinical Pharmacist 07/12/2020 7:31 AM    Height: 5\' 7"  (170.2 cm) Weight: 84 kg (185 lb 3 oz) IBW/kg (Calculated) : 61.6  Temp (24hrs), Avg:97.6 F (36.4 C), Min:96.3 F (35.7 C), Max:98.2 F (36.8 C)  Recent Labs  Lab 07/11/20 0357 07/11/20 0415 07/11/20 0500 07/11/20 0621 07/11/20 0723 07/11/20 1000 07/11/20 1511 07/11/20 2211 07/12/20 0231  WBC 53.8*  --   --   --   --  34.0*  --   --   --   CREATININE 1.68*  --  0.70 0.80  --  1.19* 0.88 0.76  --   LATICACIDVEN  --  3.2*  --   --  2.5*  --   --   --   --   VANCORANDOM  --   --   --   --   --   --   --   --  6     Estimated Creatinine Clearance: 113.6 mL/min (by C-G formula based on SCr of 0.76 mg/dL).    No Known Allergies   Thank you for allowing pharmacy to be a part of this patient's care.  07/13/20, PharmD, BCPS  07/12/2020 3:58 AM

## 2020-07-12 NOTE — Progress Notes (Signed)
Inpatient Diabetes Program Recommendations  AACE/ADA: New Consensus Statement on Inpatient Glycemic Control (2015)  Target Ranges:  Prepandial:   less than 140 mg/dL      Peak postprandial:   less than 180 mg/dL (1-2 hours)      Critically ill patients:  140 - 180 mg/dL   Lab Results  Component Value Date   GLUCAP 186 (H) 07/12/2020   HGBA1C 11.6 (H) 07/11/2020    Review of Glycemic Control Results for Anna Dennis, Anna Dennis (MRN [id number]) as of 07/12/2020 06:59  Ref. Range 07/12/2020 01:07 07/12/2020 02:04 07/12/2020 04:05 07/12/2020 05:07 07/12/2020 06:12  Glucose-Capillary Latest Ref Range: 70 - 99 mg/dL 025 (H) 852 (H) 778 (H) 170 (H) 186 (H)  Results for Anna Dennis, Anna Dennis (MRN [id number]) as of 07/12/2020 06:59  Ref. Range 07/12/2020 04:53  CO2 Latest Ref Range: 22 - 32 mmol/L 19 (L)  Results for Anna Dennis, Anna Dennis (MRN [id number]) as of 07/12/2020 06:59  Ref. Range 07/12/2020 04:53  Anion gap Latest Ref Range: 5 - 15  9  Results for Anna Dennis, Anna Dennis (MRN [id number]) as of 07/12/2020 06:59  Ref. Range 07/12/2020 02:31  Beta-Hydroxybutyric Acid Latest Ref Range: 0.05 - 0.27 mmol/L 0.41 (H)   Diabetes history: DM1(does not make insulin.  Needs correction, basal and meal coverage) Outpatient Diabetes medications:  Lantus 50 units daily Humalog-1-15 units TID, 1 unit/ 5 CHO's Current orders for Inpatient glycemic control: IV insulin  Inpatient Diabetes Program Recommendations:    When MD is ready to transition to SQ insulin, please consider:  Lantus 40 units (2 hrs before turning off drip) Novolog 0-9 units TID and HS Novolog 5 units TID with meals   Will speak with patient today.  Will continue to follow while inpatient.  Thank you, Dulce Sellar, RN, BSN Diabetes Coordinator Inpatient Diabetes Program [phone] (team pager from 8a-5p)

## 2020-07-12 NOTE — Progress Notes (Signed)
eLink Physician-Brief Progress Note Patient Name: Anna Dennis DOB: 1990-07-19 MRN: [id number]   Date of Service  07/12/2020  HPI/Events of Note    eICU Interventions  Hypokalemia/ hypophos -repleted      Intervention Category Minor Interventions: Electrolytes abnormality - evaluation and management  [name] V. Deanda Ruddell 07/12/2020, 12:00 AM

## 2020-07-13 ENCOUNTER — Encounter (HOSPITAL_COMMUNITY)
Admission: EM | Discharge status: Against Medical Advice | Payer: Self-pay | Source: Home / Self Care | Attending: Internal Medicine

## 2020-07-13 DIAGNOSIS — M726 Necrotizing fasciitis: Secondary | ICD-10-CM

## 2020-07-13 LAB — BASIC METABOLIC PANEL
Anion gap: 7 (ref 5–15)
Anion gap: 8 (ref 5–15)
Anion gap: 13 (ref 5–15)
BUN: 6 mg/dL (ref 6–20)
BUN: <5 mg/dL — ABNORMAL LOW (ref 6–20)
BUN: <5 mg/dL — ABNORMAL LOW (ref 6–20)
CO2: 23 mmol/L (ref 22–32)
CO2: 26 mmol/L (ref 22–32)
CO2: 20 mmol/L — ABNORMAL LOW (ref 22–32)
Calcium: 7.3 mg/dL — ABNORMAL LOW (ref 8.9–10.3)
Calcium: 7.5 mg/dL — ABNORMAL LOW (ref 8.9–10.3)
Calcium: 7.1 mg/dL — ABNORMAL LOW (ref 8.9–10.3)
Chloride: 104 mmol/L (ref 98–111)
Chloride: 102 mmol/L (ref 98–111)
Chloride: 102 mmol/L (ref 98–111)
Creatinine, Ser: 0.67 mg/dL (ref 0.44–1.00)
Creatinine, Ser: 0.59 mg/dL (ref 0.44–1.00)
Creatinine, Ser: 0.63 mg/dL (ref 0.44–1.00)
GFR, Estimated: >60 mL/min (ref >60)
GFR, Estimated: >60 mL/min (ref >60)
GFR, Estimated: >60 mL/min (ref >60)
Glucose, Bld: 180 mg/dL — ABNORMAL HIGH (ref 70–99)
Glucose, Bld: 140 mg/dL — ABNORMAL HIGH (ref 70–99)
Glucose, Bld: 302 mg/dL — ABNORMAL HIGH (ref 70–99)
Potassium: 2.4 mmol/L — CL (ref 3.5–5.1)
Potassium: 2.6 mmol/L — CL (ref 3.5–5.1)
Potassium: 2.8 mmol/L — ABNORMAL LOW (ref 3.5–5.1)
Sodium: 134 mmol/L — ABNORMAL LOW (ref 135–145)
Sodium: 136 mmol/L (ref 135–145)
Sodium: 135 mmol/L (ref 135–145)

## 2020-07-13 LAB — CBC
HCT: 21.6 % — ABNORMAL LOW (ref 36.0–46.0)
Hemoglobin: 7.7 g/dL — ABNORMAL LOW (ref 12.0–15.0)
MCH: 28.5 pg (ref 26.0–34.0)
MCHC: 35.6 g/dL (ref 30.0–36.0)
MCV: 80 fL (ref 80.0–100.0)
Platelets: 354 10*3/uL (ref 150–400)
RBC: 2.7 MIL/uL — ABNORMAL LOW (ref 3.87–5.11)
RDW: 14.4 % (ref 11.5–15.5)
WBC: 11.5 10*3/uL — ABNORMAL HIGH (ref 4.0–10.5)
nRBC: 0 % (ref 0.0–0.2)

## 2020-07-13 LAB — GLUCOSE, CAPILLARY
Glucose-Capillary: 120 mg/dL — ABNORMAL HIGH (ref 70–99)
Glucose-Capillary: 143 mg/dL — ABNORMAL HIGH (ref 70–99)
Glucose-Capillary: 192 mg/dL — ABNORMAL HIGH (ref 70–99)
Glucose-Capillary: 257 mg/dL — ABNORMAL HIGH (ref 70–99)
Glucose-Capillary: 331 mg/dL — ABNORMAL HIGH (ref 70–99)
Glucose-Capillary: 345 mg/dL — ABNORMAL HIGH (ref 70–99)
Glucose-Capillary: 92 mg/dL (ref 70–99)

## 2020-07-13 LAB — MAGNESIUM
Magnesium: 1.9 mg/dL (ref 1.7–2.4)
Magnesium: 2.7 mg/dL — ABNORMAL HIGH (ref 1.7–2.4)

## 2020-07-13 LAB — PHOSPHORUS
Phosphorus: 2.1 mg/dL — ABNORMAL LOW (ref 2.5–4.6)
Phosphorus: 2.6 mg/dL (ref 2.5–4.6)

## 2020-07-13 SURGERY — LUMBAR WOUND DEBRIDEMENT
Anesthesia: General | Laterality: Left

## 2020-07-13 MED ORDER — POTASSIUM CHLORIDE CRYS ER 20 MEQ PO TBCR: 20.0000 meq | EXTENDED_RELEASE_TABLET | Freq: Two times a day (BID) | ORAL | Status: DC

## 2020-07-13 MED ORDER — MIDAZOLAM HCL 2 MG/2ML IJ SOLN
INTRAMUSCULAR | Status: AC
  Filled 2020-07-13: qty 2

## 2020-07-13 MED ORDER — ONDANSETRON HCL 4 MG/2ML IJ SOLN
INTRAMUSCULAR | Status: AC
  Filled 2020-07-13: qty 2

## 2020-07-13 MED ORDER — FENTANYL CITRATE (PF) 250 MCG/5ML IJ SOLN
INTRAMUSCULAR | Status: AC
  Filled 2020-07-13: qty 5

## 2020-07-13 MED ORDER — POTASSIUM PHOSPHATES 15 MMOLE/5ML IV SOLN
30.0000 mmol | Freq: Once | INTRAVENOUS | Status: AC
  Administered 2020-07-13: 30 mmol via INTRAVENOUS
  Filled 2020-07-13: qty 10

## 2020-07-13 MED ORDER — INSULIN ASPART 100 UNIT/ML IJ SOLN
10.0000 [IU] | Freq: Once | INTRAMUSCULAR | Status: AC
  Administered 2020-07-13: 10 [IU] via SUBCUTANEOUS

## 2020-07-13 MED ORDER — PROPOFOL 10 MG/ML IV BOLUS
INTRAVENOUS | Status: AC
  Filled 2020-07-13: qty 20

## 2020-07-13 MED ORDER — INSULIN ASPART 100 UNIT/ML IJ SOLN
0.0000 [IU] | Freq: Every day | INTRAMUSCULAR | Status: DC
  Administered 2020-07-13: 4 [IU] via SUBCUTANEOUS

## 2020-07-13 MED ORDER — POTASSIUM CHLORIDE CRYS ER 20 MEQ PO TBCR
40.0000 meq | EXTENDED_RELEASE_TABLET | ORAL | Status: AC
  Administered 2020-07-13 (×2): 40 meq via ORAL
  Filled 2020-07-13 (×2): qty 2

## 2020-07-13 MED ORDER — CITALOPRAM HYDROBROMIDE 20 MG PO TABS
20.0000 mg | ORAL_TABLET | Freq: Every day | ORAL | Status: DC
  Administered 2020-07-13 – 2020-07-14 (×2): 20 mg via ORAL
  Filled 2020-07-13 (×2): qty 1

## 2020-07-13 MED ORDER — ALTEPLASE 2 MG IJ SOLR
2.0000 mg | Freq: Once | INTRAMUSCULAR | Status: AC
  Administered 2020-07-13: 2 mg
  Filled 2020-07-13: qty 2

## 2020-07-13 MED ORDER — MAGNESIUM SULFATE 2 GM/50ML IV SOLN
2.0000 g | Freq: Once | INTRAVENOUS | Status: AC
  Administered 2020-07-13: 2 g via INTRAVENOUS
  Filled 2020-07-13: qty 50

## 2020-07-13 MED ORDER — POTASSIUM CHLORIDE CRYS ER 20 MEQ PO TBCR
20.0000 meq | EXTENDED_RELEASE_TABLET | Freq: Two times a day (BID) | ORAL | Status: DC
  Administered 2020-07-14: 20 meq via ORAL
  Filled 2020-07-13: qty 1

## 2020-07-13 MED ORDER — INSULIN ASPART 100 UNIT/ML IJ SOLN
0.0000 [IU] | Freq: Three times a day (TID) | INTRAMUSCULAR | Status: DC
  Administered 2020-07-14: 1 [IU] via SUBCUTANEOUS

## 2020-07-13 MED ORDER — MAGNESIUM SULFATE IN D5W 1-5 GM/100ML-% IV SOLN
1.0000 g | Freq: Once | INTRAVENOUS | Status: AC
  Administered 2020-07-13: 1 g via INTRAVENOUS
  Filled 2020-07-13: qty 100

## 2020-07-13 NOTE — Progress Notes (Signed)
     Anna Dennis is a 30 y.o. 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  Subjective: Patient is resting comfortably.  She is inquiring about leaving the hospital.  She denies significant pain.  She still having weakness with wrist and digit extension.  She notes slightly decreased sensation along the back of her hand but otherwise doing well.  Objectyive: Vitals:   07/13/20 1300 07/13/20 1400  BP: (!) 137/97 (!) 145/82  Pulse: 100 97  Resp: (!) 24 (!) 21  Temp:    SpO2: 99% 98%     Exam: Awake and alert Respirations even and unlabored No acute distress  Left upper extremity with functional VAC.  VAC canister has recently been changed and there is 100 cc of serosanguineous drainage.  Left arm with improvement in erythema and swelling throughout the entire arm.  She is actively extending and flexing the elbow without difficulty or indication of pain.  She can flex the wrist and digits but is unable to extend the wrist or digits against gravity.  She endorses sensation to light touch along the dorsal aspect of the hand with slightly decreased sensation in that area however normal sensation within the palmar hand.  Palpable radial pulse.  Assessment: Status post debridement of left upper extremity necrotizing fasciitis with wound VAC placement -culture consistent with abundant group C strep. Radial nerve palsy   Plan: We had a lengthy discussion today.  She has full-thickness skin loss in an area approximately 8 x 8 cm.  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  Unable to comment on whether this palsy was present prior to surgery or not as the patient was unable to be adequately examined prior to surgery.  Plan was for washout and VAC change today however patient has a potassium of 2.6 and the case was canceled by anesthesia.  I have consulted wound care for wound  VAC change hopefully tomorrow.  I have also spoken with the plastic surgery team and they are willing to help with definitive coverage when appropriate.     Nicki Guadalajara, MD

## 2020-07-13 NOTE — Anesthesia Preprocedure Evaluation (Deleted)
Anesthesia Evaluation    Reviewed: Allergy & Precautions, Patient's Chart, lab work & pertinent test results  History of Anesthesia Complications Negative for: history of anesthetic complications  Airway        Dental   Pulmonary Current Smoker,           Cardiovascular negative cardio ROS       Neuro/Psych PSYCHIATRIC DISORDERS Anxiety Depression  OCD negative neurological ROS     GI/Hepatic GERD  ,(+)     substance abuse  IV drug use, Hepatitis -, C  Endo/Other  diabetes, Insulin Dependent Hypokalemia Hypocalcemia   Renal/GU Renal disease     Musculoskeletal negative musculoskeletal ROS (+) narcotic dependent  Abdominal   Peds  Hematology  (+) anemia ,  H/H 7.7/21.6    Anesthesia Other Findings   Reproductive/Obstetrics                            Anesthesia Physical Anesthesia Plan  ASA: 3  Anesthesia Plan: General   Post-op Pain Management:    Induction: Intravenous  PONV Risk Score and Plan: 3 and Treatment may vary due to age or medical condition, Ondansetron, Midazolam and Scopolamine patch - Pre-op  Airway Management Planned: Oral ETT  Additional Equipment: None  Intra-op Plan:   Post-operative Plan: Extubation in OR  Informed Consent:   Plan Discussed with: CRNA and Anesthesiologist  Anesthesia Plan Comments:         Anesthesia Quick Evaluation

## 2020-07-13 NOTE — Anesthesia Postprocedure Evaluation (Signed)
Anesthesia Post Note  Patient: Anna Dennis  Procedure(s) Performed: IRRIGATION AND DEBRIDEMENT EXTREMITY (Left: Arm Upper)     Patient location during evaluation: PACU Anesthesia Type: General Level of consciousness: patient cooperative, oriented and sedated Pain management: pain level controlled Vital Signs Assessment: post-procedure vital signs reviewed and stable Respiratory status: spontaneous breathing, nonlabored ventilation, respiratory function stable and patient connected to nasal cannula oxygen Cardiovascular status: blood pressure returned to baseline and stable Postop Assessment: no apparent nausea or vomiting Anesthetic complications: no   No notable events documented.  Last Vitals:  Vitals:   07/13/20 0700 07/13/20 0800  BP: 137/71 119/81  Pulse: 79 89  Resp: 19 (!) 21  Temp:  36.8 C  SpO2: 98% 99%    Last Pain:  Vitals:   07/13/20 0934  TempSrc:   PainSc: Asleep                 Martise Waddell COKER

## 2020-07-13 NOTE — Progress Notes (Signed)
PROGRESS NOTE    Anna Dennis  ZOX:[id number] DOB: 03/20/1990 DOA: 07/11/2020 PCP: Dartha LodgeSteele, Anthony, FNP   Brief Narrative:  30 year old female presents to ED on 6/14 with weakness, hyperglycemia, and left arm pain. Patient reports that she had a needle "snap" off into her arm a week or two prior to admission, since this time has experienced swelling and pain, further reports not taken her insulin in over a week after running out. On arrival to ED HR 133. BP 144/86. Glucose 598. Patient with noted large abscess with necrotic center to left bicep, surrounding area red and warm. Orthopedics consulted plans to take to OR for management. Critical Care Consulted for admission.  6/14 debridement 6/15 overnight - attempting to snort heroin in the hospital 6/16 -repeat debridement  Assessment & Plan:   Active Problems:   Necrotizing fasciitis (HCC)   Sepsis secondary to left arm abscess with associated necrotizing fasciitis, likely due to embedded foreign object (hypodermic needle) -Orthopedics following, debridement 07/11/2020, wound VAC ongoing -Cultures pending -Continue Zosyn, vancomycin, holding clindamycin  -ECHO normal   Anion Gap Metabolic Acidosis, Lactic Acidosis, Acute Injury, multifactorial with DKA and Sepsis -Anion gap closed, continue to titrate insulin and diet perioperatively -Continue hypoglycemic protocol, sliding scale insulin   Illicit substance/heroin abuse -Patient attempted to snort heroin in her room overnight on the 15th into the 16th, indicates one of her friends had brought her the substance, will limit visitation to patient until perioperatively to ensure no further attempts to use illicit substances  -UDS (not obtained - pt refusing) -ETOH (negative) -HIV negative   DVT prophylaxis: Heparin Code Status: Full Family Communication: None present  Status is: Inpatient  Dispo: The patient is from: Home              Anticipated d/c is to: Home               Anticipated d/c date is: 48-72h              Patient currently not medically stable for discharge  Consultants:  PCCM, plastic surgery, orthopedics  Procedures:  07/11/2020 debridement; pending secondary debridement 07/13/2020  Antimicrobials:  Vancomycin, Zosyn ongoing Clindamycin discontinued  Subjective: Overnight patient was interrupted while attempting to snort heroin that was brought by one of her friends.  Lengthy discussion about risks of ongoing illicit substance use and abuse while inpatient on multiple medications including narcotics.  Objective: Vitals:   07/13/20 0300 07/13/20 0400 07/13/20 0500 07/13/20 0600  BP: (!) 157/91 (!) 141/116 129/84 132/88  Pulse: 99 87 87 86  Resp: (!) 22 18 20 19   Temp:  98.6 F (37 C)    TempSrc:  Oral    SpO2: 99% 100% 100% 99%  Weight:      Height:        Intake/Output Summary (Last 24 hours) at 07/13/2020 0735 Last data filed at 07/13/2020 0600 Gross per 24 hour  Intake 2075.86 ml  Output 2945 ml  Net -869.14 ml   Filed Weights   07/11/20 0202  Weight: 84 kg    Examination:  General:  Pleasantly resting in bed, No acute distress. HEENT:  Normocephalic atraumatic.  Sclerae nonicteric, noninjected.  Extraocular movements intact bilaterally. Neck:  Without mass or deformity.  Trachea is midline.  Right IJ clean dry intact Lungs:  Clear to auscultate bilaterally without rhonchi, wheeze, or rales. Heart:  Regular rate and rhythm.  Without murmurs, rubs, or gallops. Abdomen:  Soft, nontender, nondistended.  Without guarding  or rebound. Extremities: Without cyanosis, clubbing, edema, or obvious deformity.  Left upper extremity wound VAC in place Vascular:  Dorsalis pedis and posterior tibial pulses palpable bilaterally. Skin:  Warm and dry, no erythema, no ulcerations.   Data Reviewed: I have personally reviewed following labs and imaging studies  CBC: Recent Labs  Lab 07/11/20 0357 07/11/20 0500 07/11/20 0621  07/11/20 1000  WBC 53.8*  --   --  34.0*  NEUTROABS 46.3*  --   --   --   HGB 10.9* 12.6 11.6* 9.9*  HCT 35.9* 37.0 34.0* 30.6*  MCV 93.0  --   --  86.4  PLT 579*  --   --  PLATELET CLUMPS NOTED ON SMEAR, UNABLE TO ESTIMATE   Basic Metabolic Panel: Recent Labs  Lab 07/11/20 0257 07/11/20 0357 07/11/20 1000 07/11/20 1511 07/11/20 2211 07/12/20 0453 07/12/20 1117 07/13/20 0052  NA  --    < > 132* 130* 132* 133*  --  134*  K  --    < > 2.3* 2.9* 2.9* 3.8  --  2.4*  CL  --    < > 105 106 106 105  --  104  CO2  --    < > 12* 17* 18* 19*  --  23  GLUCOSE  --    < > 247* 199* 206* 209*  --  180*  BUN  --    < > 28* 24* 21* 17  --  6  CREATININE  --    < > 1.19* 0.88 0.76 0.67  --  0.67  CALCIUM  --    < > 8.4* 8.3* 8.0* 7.7*  --  7.3*  MG 1.9  --  1.5*  --   --  1.7  --  1.9  PHOS 4.1  --  <1.0*  --  1.7* 3.0 1.1* 2.1*   < > = values in this interval not displayed.   GFR: Estimated Creatinine Clearance: 113.6 mL/min (by C-G formula based on SCr of 0.67 mg/dL). Liver Function Tests: Recent Labs  Lab 07/11/20 0357  AST 14*  ALT 12  ALKPHOS 166*  BILITOT 1.8*  PROT 6.8  ALBUMIN 2.6*   No results for input(s): LIPASE, AMYLASE in the last 168 hours. No results for input(s): AMMONIA in the last 168 hours. Coagulation Profile: Recent Labs  Lab 07/11/20 0357  INR 1.4*   Cardiac Enzymes: No results for input(s): CKTOTAL, CKMB, CKMBINDEX, TROPONINI in the last 168 hours. BNP (last 3 results) No results for input(s): PROBNP in the last 8760 hours. HbA1C: Recent Labs    07/11/20 0257  HGBA1C 11.6*   CBG: Recent Labs  Lab 07/12/20 1207 07/12/20 1502 07/12/20 1950 07/13/20 0042 07/13/20 0359  GLUCAP 133* 185* 289* 192* 92   Lipid Profile: No results for input(s): CHOL, HDL, LDLCALC, TRIG, CHOLHDL, LDLDIRECT in the last 72 hours. Thyroid Function Tests: No results for input(s): TSH, T4TOTAL, FREET4, T3FREE, THYROIDAB in the last 72 hours. Anemia Panel: No  results for input(s): VITAMINB12, FOLATE, FERRITIN, TIBC, IRON, RETICCTPCT in the last 72 hours. Sepsis Labs: Recent Labs  Lab 07/11/20 0415 07/11/20 0723  LATICACIDVEN 3.2* 2.5*    Recent Results (from the past 240 hour(s))  Blood Culture (routine x 2)     Status: None (Preliminary result)   Collection Time: 07/11/20  2:03 AM   Specimen: BLOOD  Result Value Ref Range Status   Specimen Description BLOOD RIGHT ANTECUBITAL  Final   Special Requests   Final  BOTTLES DRAWN AEROBIC ONLY Blood Culture results may not be optimal due to an inadequate volume of blood received in culture bottles   Culture   Final    NO GROWTH 1 DAY Performed at [facility], [address], [place], [place] [address]    Report Status PENDING  Incomplete  Resp Panel by RT-PCR (Flu A&B, Covid) Nasopharyngeal Swab     Status: None   Collection Time: 07/11/20  2:39 AM   Specimen: Nasopharyngeal Swab; Nasopharyngeal(NP) swabs in vial transport medium  Result Value Ref Range Status   SARS Coronavirus 2 by RT PCR NEGATIVE NEGATIVE Final    Comment: (NOTE) SARS-CoV-2 target nucleic acids are NOT DETECTED.  The SARS-CoV-2 RNA is generally detectable in upper respiratory specimens during the acute phase of infection. The lowest concentration of SARS-CoV-2 viral copies this assay can detect is 138 copies/mL. A negative result does not preclude SARS-Cov-2 infection and should not be used as the sole basis for treatment or other patient management decisions. A negative result may occur with  improper specimen collection/handling, submission of specimen other than nasopharyngeal swab, presence of viral mutation(s) within the areas targeted by this assay, and inadequate number of viral copies(<138 copies/mL). A negative result must be combined with clinical observations, patient history, and epidemiological information. The expected result is Negative.  Fact Sheet for Patients:   BloggerCourse.com  Fact Sheet for Healthcare Providers:  SeriousBroker.it  This test is no t yet approved or cleared by the [place] FDA and  has been authorized for detection and/or diagnosis of SARS-CoV-2 by FDA under an Emergency Use Authorization (EUA). This EUA will remain  in effect (meaning this test can be used) for the duration of the COVID-19 declaration under Section 564(b)(1) of the Act, 21 U.S.C.section 360bbb-3(b)(1), unless the authorization is terminated  or revoked sooner.       Influenza A by PCR NEGATIVE NEGATIVE Final   Influenza B by PCR NEGATIVE NEGATIVE Final    Comment: (NOTE) The Xpert Xpress SARS-CoV-2/FLU/RSV plus assay is intended as an aid in the diagnosis of influenza from Nasopharyngeal swab specimens and should not be used as a sole basis for treatment. Nasal washings and aspirates are unacceptable for Xpert Xpress SARS-CoV-2/FLU/RSV testing.  Fact Sheet for Patients: BloggerCourse.com  Fact Sheet for Healthcare Providers: SeriousBroker.it  This test is not yet approved or cleared by the [place] FDA and has been authorized for detection and/or diagnosis of SARS-CoV-2 by FDA under an Emergency Use Authorization (EUA). This EUA will remain in effect (meaning this test can be used) for the duration of the COVID-19 declaration under Section 564(b)(1) of the Act, 21 U.S.C. section 360bbb-3(b)(1), unless the authorization is terminated or revoked.  Performed at [facility], [address], [place], [place] [address]   Blood Culture (routine x 2)     Status: None (Preliminary result)   Collection Time: 07/11/20  3:16 AM   Specimen: BLOOD RIGHT WRIST  Result Value Ref Range Status   Specimen Description BLOOD RIGHT WRIST  Final   Special Requests   Final    BOTTLES DRAWN AEROBIC ONLY Blood Culture results may not be optimal due to  an inadequate volume of blood received in culture bottles   Culture   Final    NO GROWTH 1 DAY Performed at [facility], [address], [place], [place] [address]    Report Status PENDING  Incomplete  Aerobic/Anaerobic Culture w Gram Stain (surgical/deep wound)  Status: None (Preliminary result)   Collection Time: 07/11/20  5:44 AM   Specimen: Other Source; Tissue  Result Value Ref Range Status   Specimen Description TISSUE  Final   Special Requests TISSUE FROM LEFT ARM ABSC SPEC A  Final   Gram Stain   Final    RARE WBC PRESENT,BOTH PMN AND MONONUCLEAR ABUNDANT GRAM POSITIVE COCCI IN PAIRS MODERATE GRAM NEGATIVE RODS MODERATE GRAM POSITIVE RODS    Culture   Final    CULTURE REINCUBATED FOR BETTER GROWTH Performed at [facility], [address], [place], [place] [address]    Report Status PENDING  Incomplete  MRSA Next Gen by PCR, Nasal     Status: None   Collection Time: 07/11/20 10:11 AM  Result Value Ref Range Status   MRSA by PCR Next Gen NOT DETECTED NOT DETECTED Final    Comment: (NOTE) The GeneXpert MRSA Assay (FDA approved for NASAL specimens only), is one component of a comprehensive MRSA colonization surveillance program. It is not intended to diagnose MRSA infection nor to guide or monitor treatment for MRSA infections. Test performance is not FDA approved in patients less than 51 years old. Performed at [facility], [address], [place], [place] [address]          Radiology Studies: ECHOCARDIOGRAM COMPLETE  Result Date: 07/11/2020    ECHOCARDIOGRAM REPORT   Patient Name:   NASHLEY Chauncey Date of Exam: 07/11/2020 Medical Rec #:  [id number]       Height:       67.0 in Accession #:    [id number]      Weight:       185.2 lb Date of Birth:  1990/04/25       BSA:          1.957 m Patient Age:    29 years        BP:           120/73 mmHg Patient Gender: F               HR:           111 bpm. Exam Location:  Inpatient Procedure: 2D Echo,  Cardiac Doppler and Color Doppler Indications:    Bacteremia  History:        Patient has no prior history of Echocardiogram examinations.  Sonographer:    Roosvelt Maser RDCS Referring Phys: [id number] Tobey Grim IMPRESSIONS  1. Mildly elevated LVOT velocity (2.2 m/s) possibly related to vigorous LV function; visually aortic valve opens well.  2. Left ventricular ejection fraction, by estimation, is 70 to 75%. The left ventricle has hyperdynamic function. The left ventricle has no regional wall motion abnormalities. Left ventricular diastolic parameters were normal.  3. Right ventricular systolic function is normal. The right ventricular size is normal.  4. The mitral valve is normal in structure. Trivial mitral valve regurgitation. No evidence of mitral stenosis.  5. The aortic valve is tricuspid. Aortic valve regurgitation is not visualized. No aortic stenosis is present.  6. The inferior vena cava is normal in size with greater than 50% respiratory variability, suggesting right atrial pressure of 3 mmHg. FINDINGS  Left Ventricle: Left ventricular ejection fraction, by estimation, is 70 to 75%. The left ventricle has hyperdynamic function. The left ventricle has no regional wall motion abnormalities. The left ventricular internal cavity size was normal in size. There is no left ventricular hypertrophy. Left ventricular diastolic parameters were normal. Right Ventricle: The right  ventricular size is normal. Right ventricular systolic function is normal. Left Atrium: Left atrial size was normal in size. Right Atrium: Right atrial size was normal in size. Pericardium: Trivial pericardial effusion is present. Mitral Valve: The mitral valve is normal in structure. Trivial mitral valve regurgitation. No evidence of mitral valve stenosis. Tricuspid Valve: The tricuspid valve is normal in structure. Tricuspid valve regurgitation is trivial. No evidence of tricuspid stenosis. Aortic Valve: The aortic valve is tricuspid.  Aortic valve regurgitation is not visualized. No aortic stenosis is present. Aortic valve mean gradient measures 10.0 mmHg. Aortic valve peak gradient measures 19.2 mmHg. Aortic valve area, by VTI measures 1.84 cm. Pulmonic Valve: The pulmonic valve was normal in structure. Pulmonic valve regurgitation is not visualized. No evidence of pulmonic stenosis. Aorta: The aortic root is normal in size and structure. Venous: The inferior vena cava is normal in size with greater than 50% respiratory variability, suggesting right atrial pressure of 3 mmHg. IAS/Shunts: No atrial level shunt detected by color flow Doppler. Additional Comments: Mildly elevated LVOT velocity (2.2 m/s) possibly related to vigorous LV function; visually aortic valve opens well.  LEFT VENTRICLE PLAX 2D LVIDd:         4.00 cm  Diastology LVIDs:         2.60 cm  LV e' medial:    11.20 cm/s LV PW:         0.80 cm  LV E/e' medial:  8.7 LV IVS:        0.70 cm  LV e' lateral:   11.50 cm/s LVOT diam:     1.70 cm  LV E/e' lateral: 8.5 LV SV:         50 LV SV Index:   26 LVOT Area:     2.27 cm  RIGHT VENTRICLE          IVC RV Basal diam:  3.50 cm  IVC diam: 1.70 cm LEFT ATRIUM             Index       RIGHT ATRIUM           Index LA diam:        2.70 cm 1.38 cm/m  RA Area:     14.40 cm LA Vol (A2C):   62.0 ml 31.68 ml/m RA Volume:   35.20 ml  17.99 ml/m LA Vol (A4C):   39.2 ml 20.03 ml/m LA Biplane Vol: 51.2 ml 26.16 ml/m  AORTIC VALVE AV Area (Vmax):    1.77 cm AV Area (Vmean):   1.67 cm AV Area (VTI):     1.84 cm AV Vmax:           219.00 cm/s AV Vmean:          140.000 cm/s AV VTI:            0.271 m AV Peak Grad:      19.2 mmHg AV Mean Grad:      10.0 mmHg LVOT Vmax:         171.00 cm/s LVOT Vmean:        103.000 cm/s LVOT VTI:          0.220 m LVOT/AV VTI ratio: 0.81  AORTA Ao Root diam: 2.80 cm MITRAL VALVE MV Area (PHT): 4.10 cm    SHUNTS MV Decel Time: 185 msec    Systemic VTI:  0.22 m MV E velocity: 97.70 cm/s  Systemic Diam: 1.70 cm MV A  velocity: 74.90 cm/s MV E/A ratio:  1.30 Olga Millers MD Electronically signed by Olga Millers MD Signature Date/Time: 07/11/2020/6:48:30 PM    Final     Scheduled Meds:  Chlorhexidine Gluconate Cloth  6 each Topical Daily   heparin  5,000 Units Subcutaneous Q8H   insulin aspart  2-6 Units Subcutaneous Q4H   insulin detemir  25 Units Subcutaneous Q12H   sodium chloride flush  10-40 mL Intracatheter Q12H   Continuous Infusions:  clindamycin (CLEOCIN) IV 600 mg (07/13/20 0526)   piperacillin-tazobactam (ZOSYN)  IV 3.375 g (07/13/20 0430)   potassium PHOSPHATE IVPB (in mmol) 30 mmol (07/13/20 0242)   vancomycin Stopped (07/12/20 2349)     LOS: 2 days   Time spent:  Azucena Fallen, DO [facility]  If 7PM-7AM, please contact night-coverage [email or web address]  07/13/2020, 7:35 AM

## 2020-07-13 NOTE — Progress Notes (Signed)
eLink Physician-Brief Progress Note Patient Name: Anna Dennis DOB: 01/12/1991 MRN: [id number]   Date of Service  07/13/2020  HPI/Events of Note  Hypokalemia  Hypophosphatemia  Hypomagnesemia - K+ = 2.4, Mg = 1.9, PO4+ = 2.1 and Creatinine = 0.57.  eICU Interventions  Plan: Will replace K+, PO4 and Mg++.  Repeat BMP, Mg++ and PO4--- at 12 noon.     Intervention Category Major Interventions: Electrolyte abnormality - evaluation and management  [name] 07/13/2020, 2:01 AM

## 2020-07-13 NOTE — Progress Notes (Signed)
Patient asked if her mom could visit because she was in the area.  Told patient her mom could visit tomorrow once visiting hours started. Few minutes received called from [employer] about mom wanting to drop off personal belongings for patient. Belongs were picked up by unit staff.  Searched through  belongings and found 4 packs of a white powder substance and  a straw. Notified charge nurse and security services which took substance to dispose of. Will notify leadership.  E Link nurse notified and asked to continue to help monitor patient closely.

## 2020-07-13 NOTE — TOC Initial Note (Signed)
Transition of Care [facility]) - Initial/Assessment Note    Patient Details  Name: Anna Dennis MRN: [id number] Date of Birth: 31-Mar-1990  [facility] CM/SW Contact:    Lockie Pares, RN Phone Number: 07/13/2020, 11:03 AM  Clinical Narrative:                  30 year old admitted with necrotizing facitis of arm, was a needle injection spot that "broke off" in her arm sometime last week. She has been having blisters there that she has been "popping" She was just caught in the ICU on camera snorting a white substance, RN immediately went in, patient admitted it was heroin, she has just had a visitor. Lives in a apartment with mother.  Currently has wound vac on . And IV antibiotics She is not a candidate for outpatient antibiotics due to recent drug use. CM will be following for needs.  Expected Discharge Plan: Home w Home Health Services Barriers to Discharge: Continued Medical Work up (uninsured)   Patient Goals and CMS Choice        Expected Discharge Plan and Services Expected Discharge Plan: Home w Home Health Services       Living arrangements for the past 2 months: Apartment                                      Prior Living Arrangements/Services Living arrangements for the past 2 months: Apartment Lives with:: Parents Patient language and need for interpreter reviewed:: Yes        Need for Family Participation in Patient Care: Yes (Comment) Care giver support system in place?: Yes (comment)   Criminal Activity/Legal Involvement Pertinent to Current Situation/Hospitalization: No - Comment as needed  Activities of Daily Living      Permission Sought/Granted                  Emotional Assessment       Orientation: : Fluctuating Orientation (Suspected and/or reported Sundowners) Alcohol / Substance Use: Illicit Drugs Psych Involvement: No (comment)  Admission diagnosis:  Necrotizing fasciitis (HCC) [M72.6] Dehydration  [E86.0] Abscess of left arm [L02.414] Diabetic ketoacidosis without coma associated with type 1 diabetes mellitus (HCC) [E10.10] Patient Active Problem List   Diagnosis Date Noted   Necrotizing fasciitis (HCC) 07/11/2020   Hyperkalemia 09/05/2015   Syncope 09/05/2015   Acute kidney injury (nontraumatic) (HCC)    Heroin abuse (HCC)    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 [place] [address] Phone: [phone] Fax: [phone]     Social Determinants of Health (SDOH) Interventions    Readmission Risk Interventions No flowsheet data found.

## 2020-07-14 ENCOUNTER — Other Ambulatory Visit (HOSPITAL_COMMUNITY): Payer: Self-pay

## 2020-07-14 LAB — CBC
HCT: 23.6 % — ABNORMAL LOW (ref 36.0–46.0)
Hemoglobin: 8.2 g/dL — ABNORMAL LOW (ref 12.0–15.0)
MCH: 27.9 pg (ref 26.0–34.0)
MCHC: 34.7 g/dL (ref 30.0–36.0)
MCV: 80.3 fL (ref 80.0–100.0)
Platelets: 445 10*3/uL — ABNORMAL HIGH (ref 150–400)
RBC: 2.94 MIL/uL — ABNORMAL LOW (ref 3.87–5.11)
RDW: 14.6 % (ref 11.5–15.5)
WBC: 11.5 10*3/uL — ABNORMAL HIGH (ref 4.0–10.5)
nRBC: 0.4 % — ABNORMAL HIGH (ref 0.0–0.2)

## 2020-07-14 LAB — BASIC METABOLIC PANEL
Anion gap: 6 (ref 5–15)
BUN: 6 mg/dL (ref 6–20)
CO2: 26 mmol/L (ref 22–32)
Calcium: 7.8 mg/dL — ABNORMAL LOW (ref 8.9–10.3)
Chloride: 104 mmol/L (ref 98–111)
Creatinine, Ser: 0.64 mg/dL (ref 0.44–1.00)
GFR, Estimated: >60 mL/min (ref >60)
Glucose, Bld: 219 mg/dL — ABNORMAL HIGH (ref 70–99)
Potassium: 3.1 mmol/L — ABNORMAL LOW (ref 3.5–5.1)
Sodium: 136 mmol/L (ref 135–145)

## 2020-07-14 LAB — GLUCOSE, CAPILLARY: Glucose-Capillary: 150 mg/dL — ABNORMAL HIGH (ref 70–99)

## 2020-07-14 MED ORDER — PEN NEEDLES 32G X 4 MM MISC
1.0000 | Freq: Three times a day (TID) | 5 refills | Status: AC
  Filled 2020-07-14: qty 200, fill #0

## 2020-07-14 MED ORDER — ADULT MULTIVITAMIN W/MINERALS CH
1.0000 | ORAL_TABLET | Freq: Every day | ORAL | Status: DC
  Administered 2020-07-14: 1 via ORAL
  Filled 2020-07-14: qty 1

## 2020-07-14 MED ORDER — CITALOPRAM HYDROBROMIDE 20 MG PO TABS
20.0000 mg | ORAL_TABLET | Freq: Every day | ORAL | 0 refills | Status: DC
Start: 2020-07-14 — End: 2020-08-31
  Filled 2020-07-14: qty 30, 30d supply, fill #0

## 2020-07-14 MED ORDER — INSULIN ASPART PROT & ASPART (70-30 MIX) 100 UNIT/ML PEN
25.0000 [IU] | PEN_INJECTOR | Freq: Two times a day (BID) | SUBCUTANEOUS | 0 refills | Status: DC
  Filled 2020-07-14: qty 15, 30d supply, fill #0

## 2020-07-14 MED ORDER — BLOOD GLUCOSE MONITOR KIT
PACK | 0 refills | Status: DC
  Filled 2020-07-14: qty 1, fill #0

## 2020-07-14 MED ORDER — POTASSIUM CHLORIDE CRYS ER 20 MEQ PO TBCR
40.0000 meq | EXTENDED_RELEASE_TABLET | Freq: Once | ORAL | Status: AC
  Administered 2020-07-14: 40 meq via ORAL
  Filled 2020-07-14: qty 2

## 2020-07-14 MED ORDER — AMOXICILLIN-POT CLAVULANATE 875-125 MG PO TABS
1.0000 | ORAL_TABLET | Freq: Two times a day (BID) | ORAL | 0 refills | Status: AC
  Filled 2020-07-14: qty 20, 10d supply, fill #0

## 2020-07-14 MED ORDER — GLUCERNA SHAKE PO LIQD: 237.0000 mL | Freq: Three times a day (TID) | ORAL | Status: DC

## 2020-07-14 MED ORDER — GLUCOSE METER TEST VI STRP
ORAL_STRIP | 5 refills | Status: DC
  Filled 2020-07-14: qty 100, fill #0

## 2020-07-14 MED ORDER — ACCU-CHEK SOFTCLIX LANCETS MISC
5 refills | Status: DC
  Filled 2020-07-14: qty 100, fill #0

## 2020-07-14 NOTE — Progress Notes (Signed)
Patient given hydrocodone 5/325. Patient caught taking pill out of mouth and attempting  to hide pill in lap as I turned around. Unstructured pt to take pill orally and swallow or otherwise it would be discarded in sharps container.  Will continue to monitor.

## 2020-07-14 NOTE — Progress Notes (Signed)
Patient is leaving AMA.  Risk and benefits where explained to patient and she understands with repeating back that she could loose her limb and even die from not receiving the recommenced treatment. Patient agreed to get a dose of vacomyocin then cental line will removed after

## 2020-07-14 NOTE — Progress Notes (Signed)
Patient has left AMA via wheelchair escort to the bus. Patient giving gauze and NS to care for wound. Patient refuse to wait on 3 hour ABX therapy offered by the pharmacy.

## 2020-07-14 NOTE — Consult Note (Signed)
WOC Nurse Consult Note: Patient receiving care in [facility] 3M07. Reason for Consult: VAC change to LUE. Wound type: necrotizing soft tissue infection Pressure Injury POA: Yes/No/NA Measurement: Wound bed: see photo from today. Drainage (amount, consistency, odor)  Periwound: Dressing procedure/placement/frequency: With PA, Prince Rome present, along with Ivonne Andrew, RN, WTA, and the primary RN for at least half of the visit. The patient explained to [place] she is going to sign herself out AMA.  I was present for the conversation that occurred with PA M. Leotis Shames and the patient.  See his note and photo from today.  The wound was dressed with saline moistened gauze, an ABD pad and secured with kerlex. WOC nurse will not follow at this time.  Please re-consult the WOC team if needed.  Helmut Muster, RN, MSN, CWOCN, CNS-BC, pager [phone]

## 2020-07-14 NOTE — Progress Notes (Signed)
Patient ID: Anna Dennis, female   DOB: 1990/10/31, 30 y.o.   MRN: [id number]   LOS: 3 days   Subjective: No change subjectively. Pt wanting to leave AMA. Explained rationale for remaining in hospital and risk of leaving including loss of limb and death.   Objective: Vital signs in last 24 hours: Temp:  [97.6 F (36.4 C)-98.6 F (37 C)] 97.8 F (36.6 C) (06/17 0700) Pulse Rate:  [84-123] 101 (06/17 0800) Resp:  [14-29] 18 (06/17 0800) BP: (126-157)/(78-101) 147/96 (06/17 0800) SpO2:  [97 %-100 %] 99 % (06/17 0800) Last BM Date: 07/13/20   Laboratory  CBC Recent Labs    07/13/20 0726 07/14/20 0427  WBC 11.5* 11.5*  HGB 7.7* 8.2*  HCT 21.6* 23.6*  PLT 354 445*   BMET Recent Labs    07/13/20 1709 07/14/20 0427  NA 135 136  K 2.8* 3.1*  CL 102 104  CO2 20* 26  GLUCOSE 302* 219*  BUN <5* 6  CREATININE 0.63 0.64  CALCIUM 7.1* 7.8*     Physical Exam General appearance: alert and no distress LUE: Radial nerve palsy persists, 2+ radial pulse     Assessment/Plan: LUE nec fas -- F/u with Dr. Susa Simmonds or plastics outpatient.    Freeman Caldron, PA-C Orthopedic Surgery [phone] 07/14/2020

## 2020-07-14 NOTE — Progress Notes (Signed)
Inpatient Diabetes Program Recommendations  AACE/ADA: New Consensus Statement on Inpatient Glycemic Control (2015)  Target Ranges:  Prepandial:   less than 140 mg/dL      Peak postprandial:   less than 180 mg/dL (1-2 hours)      Critically ill patients:  140 - 180 mg/dL   Lab Results  Component Value Date   GLUCAP 150 (H) 07/14/2020   HGBA1C 11.6 (H) 07/11/2020    Review of Glycemic Control Results for Anna Dennis, Anna Dennis (MRN [id number]) as of 07/14/2020 10:57  Ref. Range 07/13/2020 16:55 07/13/2020 19:33 07/13/2020 21:24 07/14/2020 07:17  Glucose-Capillary Latest Ref Range: 70 - 99 mg/dL 779 (H) 390 (H) 300 (H) 150 (H)   Diabetes history: DM1(does not make insulin.  Needs correction, basal and meal coverage) Outpatient Diabetes medications:  Lantus 50 units daily Humalog-1-15 units TID, 1 unit/ 5 CHO's Current orders for Inpatient glycemic control: Levemir 25 units BID, Novolog 0-9 units TID, Novolog 0-5 units QHS   Inpatient Diabetes Program Recommendations:    Spoke with patient regarding outpatient diabetes management. [facility] to [facility] insulins, however, process is on hold given application approval process. At this time, patient has no insulin. Learned patient was leaving AMA and not willing to wait for case management assistance.  Reviewed patient's current A1c of 11.6%. Explained what a A1c is and what it measures. Also reviewed goal A1c with patient, importance of good glucose control @ home, and blood sugar goals. Patient does not have a meter. Blood glucose meter provided to patient. Reviewed when to reach out to MD. Jovita Gamma information on [facility] as an option for additional follow up.  At this time, patient planning to obtain insulin at [facility]. Mother is planning to purchase Novolin 70/30. Recommendations on dosage sent in secure chat to MD.  At discharge, consider Novolin 70/30 25 units BID. Patient has no further questions at this  time.   Thanks, Lujean Rave, MSN, RNC-OB Diabetes Coordinator [phone] ([phone])

## 2020-07-14 NOTE — Discharge Summary (Signed)
Physician Discharge Summary  Anna Dennis EXB:[id number] DOB: 1990-11-21 DOA: 07/11/2020  PCP: Elbert Ewings, FNP  Admit date: 07/11/2020 Discharge date: 07/14/2020  Admitted From: Home Disposition: Home  Recommendations for Outpatient Follow-up:  Follow up with PCP in 1-2 weeks Please obtain BMP/CBC in one week Please follow up with surgical team as scheduled  Discharge Condition: Stable CODE STATUS: Full Diet recommendation: Diabetic diet  Brief/Interim Summary: 30 year old female presents to ED on 6/14 with weakness, hyperglycemia, and left arm pain. Patient reports that she had a needle "snap" off into her arm a week or two prior to admission, since this time has experienced swelling and pain, further reports not taken her insulin in over a week after running out. On arrival to ED HR 133. BP 144/86. Glucose 598. Patient with noted large abscess with necrotic center to left bicep, surrounding area red and warm. Orthopedics consulted plans to take to OR for management. Critical Care Consulted for admission.  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  Patient had left arm debridement on 07/11/2020, tolerated quite well.  Unfortunately overnight on the 15th as well as 16th patient attempted to obtain heroin/unknown other illicit substances while in the hospital.  These medications and substances were seized and patient became somewhat verbally combative and ultimately has decided to leave [place] today on 07/14/2020.  Lengthy discussion by myself, surgical team and nursing staff about need for ongoing IV antibiotics, wound care and patient currently is on a wound VAC at the time of evaluation.  Patient was also found to be in DKA upon intake, this did normalize and her insulin and diet were titrated  appropriately.  She was discharged with 70/30 insulin given she states she could not obtain Lantus due to cost issues.  Given the patient is refusing to stay in the hospital for treatment as such patient will be discharged on p.o. Augmentin as cultures are not yet resulted there is concern this may not cover her infection which the patient has been made aware of.  Medications were to be provided by in-house pharmacy, it is unclear whether or not the patient left before these medications could have been delivered. Patient's wound VAC was also discontinued and her central line was removed.  Patient has been recommended to follow-up outpatient with surgical team.  She again understood the risks as outlined and included dismemberment death and worsening infection.   Discharge Diagnoses:  Active Problems:   Necrotizing fasciitis [facility])    Discharge Instructions  Discharge Instructions     Call MD for:  redness, tenderness, or signs of infection (pain, swelling, redness, odor or green/yellow discharge around incision site)   Complete by: As directed    Call MD for:  temperature >100.4   Complete by: As directed    Discharge wound care:   Complete by: As directed    Wet to dry per surgery      Allergies as of 07/14/2020   No Known Allergies      Medication List     STOP taking these medications    acetaminophen 325 MG tablet Commonly known as: TYLENOL   ibuprofen 200 MG tablet Commonly known as: ADVIL   insulin glargine 100 UNIT/ML injection Commonly known as: LANTUS   insulin lispro 100 UNIT/ML injection Commonly known as: HumaLOG  TAKE these medications    Accu-Chek Softclix Lancets lancets Use as directed.   amoxicillin-clavulanate 875-125 MG tablet Commonly known as: Augmentin Take 1 tablet by mouth 2 (two) times daily for 10 days.   blood glucose meter kit and supplies Kit Dispense based on patient and insurance preference. Use up to four times daily as  directed.   citalopram 20 MG tablet Commonly known as: CELEXA Take 1 tablet (20 mg total) by mouth daily for 30 doses.   Glucose Meter Test test strip Generic drug: glucose blood Use as instructed   insulin aspart protamine - aspart (70-30) 100 UNIT/ML FlexPen Commonly known as: NOVOLOG 70/30 MIX Inject 0.25 mLs (25 Units total) into the skin 2 (two) times daily with a meal.   medroxyPROGESTERone 150 MG/ML injection Commonly known as: DEPO-PROVERA Inject 150 mg into the muscle every 3 (three) months.   Pen Needles 32G X 4 MM Misc 1 each by Does not apply route 3 (three) times daily before meals.   TURMERIC CURCUMIN PO Take 1 capsule by mouth daily.               Discharge Care Instructions  (From admission, onward)           Start     Ordered   07/14/20 0000  Discharge wound care:       Comments: Wet to dry per surgery   07/14/20 1150            No Known Allergies   Procedures/Studies: DG Chest Port 1 View  Result Date: 07/11/2020 CLINICAL DATA:  Line placement EXAM: PORTABLE CHEST 1 VIEW COMPARISON:  07/11/2020 FINDINGS: Right central line tip in the SVC. No pneumothorax. Heart and mediastinal contours are within normal limits. No focal opacities or effusions. No acute bony abnormality. IMPRESSION: Right central line tip in the SVC.  No pneumothorax. No active cardiopulmonary disease. Electronically Signed   By: Rolm Baptise M.D.   On: 07/11/2020 03:53   DG Chest Port 1 View  Result Date: 07/11/2020 CLINICAL DATA:  Possible sepsis, hyperglycemia, heroin use EXAM: PORTABLE CHEST 1 VIEW COMPARISON:  Radiograph 02/19/2012 FINDINGS: No consolidation, features of edema, pneumothorax, or effusion. Pulmonary vascularity is normally distributed. The cardiomediastinal contours are unremarkable. No acute osseous or soft tissue abnormality. 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 Telemetry leads overlie the chest. IMPRESSION: No acute  cardiopulmonary abnormality. Electronically Signed   By: Lovena Le M.D.   On: 07/11/2020 02:31   DG Humerus Left  Result Date: 07/11/2020 CLINICAL DATA:  Hyperglycemic, has not recently taken diabetes medications. Reports broken IV needle in the upper arm 1 week prior. 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 EXAM: LEFT HUMERUS - 2+ VIEW COMPARISON:  None. FINDINGS: There is extensive circumferential soft tissue swelling of the upper arm with the large volume of soft tissue gas predominantly within the anteromedial soft tissues. A tiny linear 6 mm foreign body is seen in anterior soft tissues of the distal upper arm compatible with reported broken needle. No acute fracture, traumatic malalignment, suspicious osseous lesions or radiographic features of osteomyelitis seen at this time. IMPRESSION: Extensive soft tissue swelling and edematous changes with widespread soft tissue gas in the anteromedial upper arm concerning for an aggressive soft tissue infection with gas-forming organism. Emergent surgical assessment is warranted. Tiny 6 mm linear radiodense foreign body in the distal anterior aspect of the upper arm compatible with reported foreign body (broken intravenous needle tip) Critical Value/emergent results were called by telephone  at the time of interpretation on 07/11/2020 at 2:30 am to provider Ripley Fraise , who verbally acknowledged these results. Electronically Signed   By: Lovena Le M.D.   On: 07/11/2020 02:30   DG Hand Complete Left  Result Date: 07/11/2020 CLINICAL DATA:  Swelling, redness on posterior hand.  Pain EXAM: LEFT HAND - COMPLETE 3+ VIEW COMPARISON:  None. FINDINGS: There is no evidence of fracture or dislocation. There is no evidence of arthropathy or other focal bone abnormality. Soft tissues are unremarkable. No radiopaque foreign body. IMPRESSION: Negative. Electronically Signed   By: Rolm Baptise M.D.   On: 07/11/2020 03:54   DG MINI C-ARM IMAGE ONLY  Result Date:  07/11/2020 There is no interpretation for this exam.  This order is for images obtained during a surgical procedure.  Please See "Surgeries" Tab for more information regarding the procedure.   ECHOCARDIOGRAM COMPLETE  Result Date: 07/11/2020    ECHOCARDIOGRAM REPORT   Patient Name:   CENIYAH THORP Date of Exam: 07/11/2020 Medical Rec #:  [id number]       Height:       67.0 in Accession #:    [id number]      Weight:       185.2 lb Date of Birth:  1990-09-19       BSA:          1.957 m Patient Age:    29 years        BP:           120/73 mmHg Patient Gender: F               HR:           111 bpm. Exam Location:  Inpatient Procedure: 2D Echo, Cardiac Doppler and Color Doppler Indications:    Bacteremia  History:        Patient has no prior history of Echocardiogram examinations.  Sonographer:    Merrie Roof RDCS Referring Phys: [place]  1. Mildly elevated LVOT velocity (2.2 m/s) possibly related to vigorous LV function; visually aortic valve opens well.  2. Left ventricular ejection fraction, by estimation, is 70 to 75%. The left ventricle has hyperdynamic function. The left ventricle has no regional wall motion abnormalities. Left ventricular diastolic parameters were normal.  3. Right ventricular systolic function is normal. The right ventricular size is normal.  4. The mitral valve is normal in structure. Trivial mitral valve regurgitation. No evidence of mitral stenosis.  5. The aortic valve is tricuspid. Aortic valve regurgitation is not visualized. No aortic stenosis is present.  6. The inferior vena cava is normal in size with greater than 50% respiratory variability, suggesting right atrial pressure of 3 mmHg. FINDINGS  Left Ventricle: Left ventricular ejection fraction, by estimation, is 70 to 75%. The left ventricle has hyperdynamic function. The left ventricle has no regional wall motion abnormalities. The left ventricular internal cavity size was normal in size. There is no  left ventricular hypertrophy. Left ventricular diastolic parameters were normal. Right Ventricle: The right ventricular size is normal. Right ventricular systolic function is normal. Left Atrium: Left atrial size was normal in size. Right Atrium: Right atrial size was normal in size. Pericardium: Trivial pericardial effusion is present. Mitral Valve: The mitral valve is normal in structure. Trivial mitral valve regurgitation. No evidence of mitral valve stenosis. Tricuspid Valve: The tricuspid valve is normal in structure. Tricuspid valve regurgitation is trivial. No evidence of tricuspid stenosis. Aortic Valve: The aortic  valve is tricuspid. Aortic valve regurgitation is not visualized. No aortic stenosis is present. Aortic valve mean gradient measures 10.0 mmHg. Aortic valve peak gradient measures 19.2 mmHg. Aortic valve area, by VTI measures 1.84 cm. Pulmonic Valve: The pulmonic valve was normal in structure. Pulmonic valve regurgitation is not visualized. No evidence of pulmonic stenosis. Aorta: The aortic root is normal in size and structure. Venous: The inferior vena cava is normal in size with greater than 50% respiratory variability, suggesting right atrial pressure of 3 mmHg. IAS/Shunts: No atrial level shunt detected by color flow Doppler. Additional Comments: Mildly elevated LVOT velocity (2.2 m/s) possibly related to vigorous LV function; visually aortic valve opens well.  LEFT VENTRICLE PLAX 2D LVIDd:         4.00 cm  Diastology LVIDs:         2.60 cm  LV e' medial:    11.20 cm/s LV PW:         0.80 cm  LV E/e' medial:  8.7 LV IVS:        0.70 cm  LV e' lateral:   11.50 cm/s LVOT diam:     1.70 cm  LV E/e' lateral: 8.5 LV SV:         50 LV SV Index:   26 LVOT Area:     2.27 cm  RIGHT VENTRICLE          IVC RV Basal diam:  3.50 cm  IVC diam: 1.70 cm LEFT ATRIUM             Index       RIGHT ATRIUM           Index LA diam:        2.70 cm 1.38 cm/m  RA Area:     14.40 cm LA Vol (A2C):   62.0 ml 31.68  ml/m RA Volume:   35.20 ml  17.99 ml/m LA Vol (A4C):   39.2 ml 20.03 ml/m LA Biplane Vol: 51.2 ml 26.16 ml/m  AORTIC VALVE AV Area (Vmax):    1.77 cm AV Area (Vmean):   1.67 cm AV Area (VTI):     1.84 cm AV Vmax:           219.00 cm/s AV Vmean:          140.000 cm/s AV VTI:            0.271 m AV Peak Grad:      19.2 mmHg AV Mean Grad:      10.0 mmHg LVOT Vmax:         171.00 cm/s LVOT Vmean:        103.000 cm/s LVOT VTI:          0.220 m LVOT/AV VTI ratio: 0.81  AORTA Ao Root diam: 2.80 cm MITRAL VALVE MV Area (PHT): 4.10 cm    SHUNTS MV Decel Time: 185 msec    Systemic VTI:  0.22 m MV E velocity: 97.70 cm/s  Systemic Diam: 1.70 cm MV A velocity: 74.90 cm/s MV E/A ratio:  1.30 Kirk Ruths MD Electronically signed by Kirk Ruths MD Signature Date/Time: 07/11/2020/6:48:30 PM    Final    CT Extrem Up Entire Arm L WO/W CM  Result Date: 07/11/2020 CLINICAL DATA:  30 year old female with left upper extremity pain swelling and redness. Extensive soft tissue gas about the humerus on x-rays, with possible tiny metallic needle retained foreign body. EXAM: CT OF THE UPPER LEFT EXTREMITY WITHOUT AND WITH CONTRAST TECHNIQUE: Multidetector  CT imaging of the upper left extremity was performed following the standard protocol before and during bolus administration of intravenous contrast. COMPARISON:  Left humerus radiographs 0216 hours. CONTRAST:  119m OMNIPAQUE IOHEXOL 300 MG/ML  SOLN FINDINGS: Study is moderately degraded by motion artifact. Subtle, linear roughly 8-9 mm hyperdense foreign body suspected in the ventral subcutaneous soft tissues on series 4, image 172 corresponding to the radiographic finding. This is 4 mm deep to the skin surface. Large volume of subcutaneous gas throughout the ventral upper arm, tracking medially more so than laterally. Superimposed diffuse subcutaneous edema. No organized fluid collection identified. The soft tissue abnormality extends to the ventral muscle layer, and some  ventral myositis is suspected (for example on series 4, image 103 at the mid humerus shaft level). IV contrast bolus is insufficient for detailed vascular evaluation. There are small but reactive appearing left axillary lymph nodes individually up to 14 mm short axis. Normal alignment of osseous structures at the visible left shoulder. Motion artifact degrades bone detail. And there is suspicious osteopenia of the ventral humerus cortex, for example on series 3, image 120. But no discrete cortical osteolysis. Alignment appears maintained at the left elbow, and no elbow joint effusion is identified. Grossly negative visible left chest and upper abdomen. IMPRESSION: 1. Moderately degraded by motion. 2. 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 3. Subtle retained metallic foreign body, possible needle fragment, located 4 mm deep to the skin surface on series 4, image 172. 4. No organized fluid collection or abscess identified. 5. Limited evaluation for humerus osteomyelitis due to motion, but there is suspicious osteopenia of the ventral humeral cortex. 6. Reactive right axillary lymph nodes. Electronically Signed   By: HGenevie AnnM.D.   On: 07/11/2020 04:56     Subjective: Patient refusing to discuss her case further requesting to leave AMA   Discharge Exam: Vitals:   07/14/20 1000 07/14/20 1100  BP: (!) 158/93   Pulse: (!) 118 (!) 105  Resp: (!) 26 20  Temp:    SpO2: 98% 98%   Vitals:   07/14/20 0800 07/14/20 0900 07/14/20 1000 07/14/20 1100  BP: (!) 147/96  (!) 158/93   Pulse: (!) 101 (!) 123 (!) 118 (!) 105  Resp: 18 (!) 24 (!) 26 20  Temp:      TempSrc:      SpO2: 99% 99% 98% 98%  Weight:      Height:        General: Pt is alert, awake, not in acute distress Cardiovascular: RRR, S1/S2 +, no rubs, no gallops; right IJ being removed by nurse Respiratory: CTA bilaterally, no wheezing, no rhonchi Abdominal: Soft, NT, ND, bowel sounds + Extremities: no edema, no cyanosis left  upper extremity    The results of significant diagnostics from this hospitalization (including imaging, microbiology, ancillary and laboratory) are listed below for reference.     Microbiology: Recent Results (from the past 240 hour(s))  Blood Culture (routine x 2)     Status: None (Preliminary result)   Collection Time: 07/11/20  2:03 AM   Specimen: BLOOD  Result Value Ref Range Status   Specimen Description BLOOD RIGHT ANTECUBITAL  Final   Special Requests   Final    BOTTLES DRAWN AEROBIC ONLY Blood Culture results may not be optimal due to an inadequate volume of blood received in culture bottles   Culture   Final    NO GROWTH 3 DAYS Performed at [facility] [address]  [place] [address]    Report Status PENDING  Incomplete  Resp Panel by RT-PCR (Flu A&B, Covid) Nasopharyngeal Swab     Status: None   Collection Time: 07/11/20  2:39 AM   Specimen: Nasopharyngeal Swab; Nasopharyngeal(NP) swabs in vial transport medium  Result Value Ref Range Status   SARS Coronavirus 2 by RT PCR NEGATIVE NEGATIVE Final    Comment: (NOTE) SARS-CoV-2 target nucleic acids are NOT DETECTED.  The SARS-CoV-2 RNA is generally detectable in upper respiratory specimens during the acute phase of infection. The lowest concentration of SARS-CoV-2 viral copies this assay can detect is 138 copies/mL. A negative result does not preclude SARS-Cov-2 infection and should not be used as the sole basis for treatment or other patient management decisions. A negative result may occur with  improper specimen collection/handling, submission of specimen other than nasopharyngeal swab, presence of viral mutation(s) within the areas targeted by this assay, and inadequate number of viral copies(<138 copies/mL). A negative result must be combined with clinical observations, patient history, and epidemiological information. The expected result is Negative.  Fact Sheet for Patients:   EntrepreneurPulse.com.au  Fact Sheet for Healthcare Providers:  IncredibleEmployment.be  This test is no t yet approved or cleared by the [place] FDA and  has been authorized for detection and/or diagnosis of SARS-CoV-2 by FDA under an Emergency Use Authorization (EUA). This EUA will remain  in effect (meaning this test can be used) for the duration of the COVID-19 declaration under Section 564(b)(1) of the Act, 21 U.S.C.section 360bbb-3(b)(1), unless the authorization is terminated  or revoked sooner.       Influenza A by PCR NEGATIVE NEGATIVE Final   Influenza B by PCR NEGATIVE NEGATIVE Final    Comment: (NOTE) The Xpert Xpress SARS-CoV-2/FLU/RSV plus assay is intended as an aid in the diagnosis of influenza from Nasopharyngeal swab specimens and should not be used as a sole basis for treatment. Nasal washings and aspirates are unacceptable for Xpert Xpress SARS-CoV-2/FLU/RSV testing.  Fact Sheet for Patients: EntrepreneurPulse.com.au  Fact Sheet for Healthcare Providers: IncredibleEmployment.be  This test is not yet approved or cleared by the [place] FDA and has been authorized for detection and/or diagnosis of SARS-CoV-2 by FDA under an Emergency Use Authorization (EUA). This EUA will remain in effect (meaning this test can be used) for the duration of the COVID-19 declaration under Section 564(b)(1) of the Act, 21 U.S.C. section 360bbb-3(b)(1), unless the authorization is terminated or revoked.  Performed at [facility], [place] [address], [place], [place] [address]   Blood Culture (routine x 2)     Status: None (Preliminary result)   Collection Time: 07/11/20  3:16 AM   Specimen: BLOOD RIGHT WRIST  Result Value Ref Range Status   Specimen Description BLOOD RIGHT WRIST  Final   Special Requests   Final    BOTTLES DRAWN AEROBIC ONLY Blood Culture results may not be optimal due to  an inadequate volume of blood received in culture bottles   Culture   Final    NO GROWTH 3 DAYS Performed at [facility], [place] [address], [place], [place] [address]    Report Status PENDING  Incomplete  Fungus Culture With Stain     Status: None (Preliminary result)   Collection Time: 07/11/20  5:44 AM   Specimen: Other Source; Tissue  Result Value Ref Range Status   Fungus Stain Final report  Final    Comment: (NOTE) Performed At: [facility] [address]  [place], [place] [id number] Rush Farmer MD LO:[id number]    Fungus (Mycology) Culture PENDING  Incomplete   Fungal Source TIS FROM LEFT ARM ABSC SPEC A  Final    Comment: Performed at [facility], [place] [address], [place], [place] [address]  Aerobic/Anaerobic Culture w Gram Stain (surgical/deep wound)     Status: None (Preliminary result)   Collection Time: 07/11/20  5:44 AM   Specimen: Other Source; Tissue  Result Value Ref Range Status   Specimen Description TISSUE  Final   Special Requests TISSUE FROM LEFT ARM ABSC SPEC A  Final   Gram Stain   Final    RARE WBC PRESENT,BOTH PMN AND MONONUCLEAR ABUNDANT GRAM POSITIVE COCCI IN PAIRS MODERATE GRAM NEGATIVE RODS MODERATE GRAM POSITIVE RODS Performed at [facility], [place] [address] [place], [place] [address]    Culture   Final    ABUNDANT STREPTOCOCCUS GROUP C Beta hemolytic streptococci are predictably susceptible to penicillin and other beta lactams. Susceptibility testing not routinely performed. NO ANAEROBES ISOLATED; CULTURE IN PROGRESS FOR 5 DAYS    Report Status PENDING  Incomplete  Fungus Culture Result     Status: None   Collection Time: 07/11/20  5:44 AM  Result Value Ref Range Status   Result 1 Comment  Final    Comment: (NOTE) KOH/Calcofluor preparation:  no fungus observed. Performed At: [facility] [place], [place] [id number] Rush Farmer MD FT:[id number]   MRSA Next Gen by PCR, Nasal     Status:  None   Collection Time: 07/11/20 10:11 AM  Result Value Ref Range Status   MRSA by PCR Next Gen NOT DETECTED NOT DETECTED Final    Comment: (NOTE) The GeneXpert MRSA Assay (FDA approved for NASAL specimens only), is one component of a comprehensive MRSA colonization surveillance program. It is not intended to diagnose MRSA infection nor to guide or monitor treatment for MRSA infections. Test performance is not FDA approved in patients less than 50 years old. Performed at [facility], [place] [address] [place], [place] [address]      Labs: BNP (last 3 results) No results for input(s): BNP in the last 8760 hours. Basic Metabolic Panel: Recent Labs  Lab 07/11/20 0257 07/11/20 0357 07/11/20 1000 07/11/20 1511 07/11/20 2211 07/12/20 0453 07/12/20 1117 07/13/20 0052 07/13/20 1148 07/13/20 1709 07/14/20 0427  NA  --    < > 132*   < > 132* 133*  --  134* 136 135 136  K  --    < > 2.3*   < > 2.9* 3.8  --  2.4* 2.6* 2.8* 3.1*  CL  --    < > 105   < > 106 105  --  104 102 102 104  CO2  --    < > 12*   < > 18* 19*  --  23 26 20* 26  GLUCOSE  --    < > 247*   < > 206* 209*  --  180* 140* 302* 219*  BUN  --    < > 28*   < > 21* 17  --  6 <5* <5* 6  CREATININE  --    < > 1.19*   < > 0.76 0.67  --  0.67 0.59 0.63 0.64  CALCIUM  --    < > 8.4*   < > 8.0* 7.7*  --  7.3* 7.5* 7.1* 7.8*  MG 1.9  --  1.5*  --   --  1.7  --  1.9 2.7*  --   --   PHOS 4.1  --  <1.0*  --  1.7* 3.0 1.1* 2.1* 2.6  --   --    < > = values in this interval not displayed.   Liver Function Tests: Recent Labs  Lab 07/11/20 0357  AST 14*  ALT 12  ALKPHOS 166*  BILITOT 1.8*  PROT 6.8  ALBUMIN 2.6*   No results for input(s): LIPASE, AMYLASE in the last 168 hours. No results for input(s): AMMONIA in the last 168 hours. CBC: Recent Labs  Lab 07/11/20 0357 07/11/20 0500 07/11/20 0621 07/11/20 1000 07/13/20 0726 07/14/20 0427  WBC 53.8*  --   --  34.0* 11.5* 11.5*  NEUTROABS 46.3*  --   --   --   --    --   HGB 10.9* 12.6 11.6* 9.9* 7.7* 8.2*  HCT 35.9* 37.0 34.0* 30.6* 21.6* 23.6*  MCV 93.0  --   --  86.4 80.0 80.3  PLT 579*  --   --  PLATELET CLUMPS NOTED ON SMEAR, UNABLE TO ESTIMATE 354 445*   Cardiac Enzymes: No results for input(s): CKTOTAL, CKMB, CKMBINDEX, TROPONINI in the last 168 hours. BNP: Invalid input(s): POCBNP CBG: Recent Labs  Lab 07/13/20 1139 07/13/20 1655 07/13/20 1933 07/13/20 2124 07/14/20 0717  GLUCAP 143* 331* 257* 345* 150*   D-Dimer No results for input(s): DDIMER in the last 72 hours. Hgb A1c No results for input(s): HGBA1C in the last 72 hours. Lipid Profile No results for input(s): CHOL, HDL, LDLCALC, TRIG, CHOLHDL, LDLDIRECT in the last 72 hours. Thyroid function studies No results for input(s): TSH, T4TOTAL, T3FREE, THYROIDAB in the last 72 hours.  Invalid input(s): FREET3 Anemia work up No results for input(s): VITAMINB12, FOLATE, FERRITIN, TIBC, IRON, RETICCTPCT in the last 72 hours. Urinalysis    Component Value Date/Time   COLORURINE COLORLESS (A) 07/25/2016 0730   APPEARANCEUR CLEAR 07/25/2016 0730   LABSPEC 1.025 07/25/2016 0730   PHURINE 6.0 07/25/2016 0730   GLUCOSEU >=500 (A) 07/25/2016 0730   HGBUR NEGATIVE 07/25/2016 0730   BILIRUBINUR NEGATIVE 07/25/2016 0730   KETONESUR 80 (A) 07/25/2016 0730   PROTEINUR NEGATIVE 07/25/2016 0730   UROBILINOGEN 1.0 03/16/2015 1713   NITRITE NEGATIVE 07/25/2016 0730   LEUKOCYTESUR NEGATIVE 07/25/2016 0730   Sepsis Labs Invalid input(s): PROCALCITONIN,  WBC,  LACTICIDVEN Microbiology Recent Results (from the past 240 hour(s))  Blood Culture (routine x 2)     Status: None (Preliminary result)   Collection Time: 07/11/20  2:03 AM   Specimen: BLOOD  Result Value Ref Range Status   Specimen Description BLOOD RIGHT ANTECUBITAL  Final   Special Requests   Final    BOTTLES DRAWN AEROBIC ONLY Blood Culture results may not be optimal due to an inadequate volume of blood received in culture  bottles   Culture   Final    NO GROWTH 3 DAYS Performed at [facility], [place] [address] [place], [place] [address]    Report Status PENDING  Incomplete  Resp Panel by RT-PCR (Flu A&B, Covid) Nasopharyngeal Swab     Status: None   Collection Time: 07/11/20  2:39 AM   Specimen: Nasopharyngeal Swab; Nasopharyngeal(NP) swabs in vial transport medium  Result Value Ref Range Status   SARS Coronavirus 2 by RT PCR NEGATIVE NEGATIVE Final    Comment: (NOTE) SARS-CoV-2 target nucleic acids are NOT DETECTED.  The SARS-CoV-2 RNA is generally detectable in upper respiratory specimens during the  acute phase of infection. The lowest concentration of SARS-CoV-2 viral copies this assay can detect is 138 copies/mL. A negative result does not preclude SARS-Cov-2 infection and should not be used as the sole basis for treatment or other patient management decisions. A negative result may occur with  improper specimen collection/handling, submission of specimen other than nasopharyngeal swab, presence of viral mutation(s) within the areas targeted by this assay, and inadequate number of viral copies(<138 copies/mL). A negative result must be combined with clinical observations, patient history, and epidemiological information. The expected result is Negative.  Fact Sheet for Patients:  EntrepreneurPulse.com.au  Fact Sheet for Healthcare Providers:  IncredibleEmployment.be  This test is no t yet approved or cleared by the [place] FDA and  has been authorized for detection and/or diagnosis of SARS-CoV-2 by FDA under an Emergency Use Authorization (EUA). This EUA will remain  in effect (meaning this test can be used) for the duration of the COVID-19 declaration under Section 564(b)(1) of the Act, 21 U.S.C.section 360bbb-3(b)(1), unless the authorization is terminated  or revoked sooner.       Influenza A by PCR NEGATIVE NEGATIVE Final   Influenza B  by PCR NEGATIVE NEGATIVE Final    Comment: (NOTE) The Xpert Xpress SARS-CoV-2/FLU/RSV plus assay is intended as an aid in the diagnosis of influenza from Nasopharyngeal swab specimens and should not be used as a sole basis for treatment. Nasal washings and aspirates are unacceptable for Xpert Xpress SARS-CoV-2/FLU/RSV testing.  Fact Sheet for Patients: EntrepreneurPulse.com.au  Fact Sheet for Healthcare Providers: IncredibleEmployment.be  This test is not yet approved or cleared by the [place] FDA and has been authorized for detection and/or diagnosis of SARS-CoV-2 by FDA under an Emergency Use Authorization (EUA). This EUA will remain in effect (meaning this test can be used) for the duration of the COVID-19 declaration under Section 564(b)(1) of the Act, 21 U.S.C. section 360bbb-3(b)(1), unless the authorization is terminated or revoked.  Performed at [facility], [place] [address], [place], [place] [address]   Blood Culture (routine x 2)     Status: None (Preliminary result)   Collection Time: 07/11/20  3:16 AM   Specimen: BLOOD RIGHT WRIST  Result Value Ref Range Status   Specimen Description BLOOD RIGHT WRIST  Final   Special Requests   Final    BOTTLES DRAWN AEROBIC ONLY Blood Culture results may not be optimal due to an inadequate volume of blood received in culture bottles   Culture   Final    NO GROWTH 3 DAYS Performed at [facility], [place] [address] [place], [place] [address]    Report Status PENDING  Incomplete  Fungus Culture With Stain     Status: None (Preliminary result)   Collection Time: 07/11/20  5:44 AM   Specimen: Other Source; Tissue  Result Value Ref Range Status   Fungus Stain Final report  Final    Comment: (NOTE) Performed At: [facility] [place], [place] [id number] Rush Farmer MD OM:[id number]    Fungus (Mycology) Culture PENDING  Incomplete   Fungal Source TIS FROM  LEFT ARM ABSC SPEC A  Final    Comment: Performed at [facility], [place] [address], [place], [place] [address]  Aerobic/Anaerobic Culture w Gram Stain (surgical/deep wound)     Status: None (Preliminary result)   Collection Time: 07/11/20  5:44 AM   Specimen: Other Source; Tissue  Result Value Ref Range Status   Specimen Description TISSUE  Final   Special Requests TISSUE FROM LEFT ARM ABSC SPEC A  Final   Gram Stain   Final    RARE WBC PRESENT,BOTH PMN AND MONONUCLEAR ABUNDANT GRAM POSITIVE COCCI IN PAIRS MODERATE GRAM NEGATIVE RODS MODERATE GRAM POSITIVE RODS Performed at [facility], [place] [address], [place], [place] [address]    Culture   Final    ABUNDANT STREPTOCOCCUS GROUP C Beta hemolytic streptococci are predictably susceptible to penicillin and other beta lactams. Susceptibility testing not routinely performed. NO ANAEROBES ISOLATED; CULTURE IN PROGRESS FOR 5 DAYS    Report Status PENDING  Incomplete  Fungus Culture Result     Status: None   Collection Time: 07/11/20  5:44 AM  Result Value Ref Range Status   Result 1 Comment  Final    Comment: (NOTE) KOH/Calcofluor preparation:  no fungus observed. Performed At: [facility] [place], [place] [id number] Rush Farmer MD NS:[id number]   MRSA Next Gen by PCR, Nasal     Status: None   Collection Time: 07/11/20 10:11 AM  Result Value Ref Range Status   MRSA by PCR Next Gen NOT DETECTED NOT DETECTED Final    Comment: (NOTE) The GeneXpert MRSA Assay (FDA approved for NASAL specimens only), is one component of a comprehensive MRSA colonization surveillance program. It is not intended to diagnose MRSA infection nor to guide or monitor treatment for MRSA infections. Test performance is not FDA approved in patients less than 70 years old. Performed at [facility], [place] [address], [place], [place] [address]      Time coordinating discharge: Over 30 minutes  SIGNED:   Little Ishikawa, DO [facility] 07/14/2020, 11:50 AM Pager   If 7PM-7AM, please contact night-coverage [email or web address]

## 2020-07-16 LAB — CULTURE, BLOOD (ROUTINE X 2)
Culture: NO GROWTH
Culture: NO GROWTH

## 2020-07-16 LAB — AEROBIC/ANAEROBIC CULTURE W GRAM STAIN (SURGICAL/DEEP WOUND)

## 2020-08-09 LAB — FUNGAL ORGANISM REFLEX

## 2020-08-09 LAB — FUNGUS CULTURE WITH STAIN

## 2020-08-09 LAB — FUNGUS CULTURE RESULT

## 2020-08-27 ENCOUNTER — Encounter (HOSPITAL_COMMUNITY): Payer: Self-pay | Admitting: Family Medicine

## 2020-08-27 ENCOUNTER — Emergency Department (HOSPITAL_COMMUNITY): Payer: Self-pay

## 2020-08-27 ENCOUNTER — Inpatient Hospital Stay: Payer: Self-pay

## 2020-08-27 ENCOUNTER — Encounter (HOSPITAL_COMMUNITY)
Admission: EM | Disposition: A | Discharge status: Home / Self Care | Payer: Self-pay | Source: Home / Self Care | Attending: Family Medicine

## 2020-08-27 ENCOUNTER — Inpatient Hospital Stay (HOSPITAL_COMMUNITY): Payer: Self-pay | Admitting: Anesthesiology

## 2020-08-27 ENCOUNTER — Inpatient Hospital Stay (HOSPITAL_COMMUNITY)
Admission: EM | Admit: 2020-08-27 | Discharge: 2020-08-31 | DRG: 854 | Disposition: A | Discharge status: Home / Self Care | Payer: Self-pay | Attending: Family Medicine | Admitting: Family Medicine

## 2020-08-27 ENCOUNTER — Inpatient Hospital Stay (HOSPITAL_COMMUNITY): Payer: Self-pay

## 2020-08-27 ENCOUNTER — Other Ambulatory Visit: Payer: Self-pay

## 2020-08-27 DIAGNOSIS — I959 Hypotension, unspecified: Secondary | ICD-10-CM | POA: Diagnosis not present

## 2020-08-27 DIAGNOSIS — F17210 Nicotine dependence, cigarettes, uncomplicated: Secondary | ICD-10-CM | POA: Diagnosis present

## 2020-08-27 DIAGNOSIS — L02414 Cutaneous abscess of left upper limb: Secondary | ICD-10-CM | POA: Diagnosis present

## 2020-08-27 DIAGNOSIS — Z9114 Patient's other noncompliance with medication regimen: Secondary | ICD-10-CM

## 2020-08-27 DIAGNOSIS — I160 Hypertensive urgency: Secondary | ICD-10-CM | POA: Diagnosis present

## 2020-08-27 DIAGNOSIS — F1110 Opioid abuse, uncomplicated: Secondary | ICD-10-CM | POA: Diagnosis present

## 2020-08-27 DIAGNOSIS — F32A Depression, unspecified: Secondary | ICD-10-CM | POA: Diagnosis present

## 2020-08-27 DIAGNOSIS — L03114 Cellulitis of left upper limb: Secondary | ICD-10-CM

## 2020-08-27 DIAGNOSIS — K219 Gastro-esophageal reflux disease without esophagitis: Secondary | ICD-10-CM | POA: Diagnosis present

## 2020-08-27 DIAGNOSIS — E871 Hypo-osmolality and hyponatremia: Secondary | ICD-10-CM | POA: Diagnosis present

## 2020-08-27 DIAGNOSIS — Z794 Long term (current) use of insulin: Secondary | ICD-10-CM

## 2020-08-27 DIAGNOSIS — B182 Chronic viral hepatitis C: Secondary | ICD-10-CM | POA: Diagnosis present

## 2020-08-27 DIAGNOSIS — A419 Sepsis, unspecified organism: Principal | ICD-10-CM

## 2020-08-27 DIAGNOSIS — F429 Obsessive-compulsive disorder, unspecified: Secondary | ICD-10-CM | POA: Diagnosis present

## 2020-08-27 DIAGNOSIS — R6520 Severe sepsis without septic shock: Secondary | ICD-10-CM | POA: Diagnosis present

## 2020-08-27 DIAGNOSIS — Z20822 Contact with and (suspected) exposure to covid-19: Secondary | ICD-10-CM | POA: Diagnosis present

## 2020-08-27 DIAGNOSIS — Z9641 Presence of insulin pump (external) (internal): Secondary | ICD-10-CM | POA: Diagnosis present

## 2020-08-27 DIAGNOSIS — B954 Other streptococcus as the cause of diseases classified elsewhere: Secondary | ICD-10-CM | POA: Diagnosis present

## 2020-08-27 DIAGNOSIS — R000 Tachycardia, unspecified: Secondary | ICD-10-CM | POA: Diagnosis present

## 2020-08-27 DIAGNOSIS — Z793 Long term (current) use of hormonal contraceptives: Secondary | ICD-10-CM

## 2020-08-27 DIAGNOSIS — Z79899 Other long term (current) drug therapy: Secondary | ICD-10-CM

## 2020-08-27 DIAGNOSIS — D72829 Elevated white blood cell count, unspecified: Secondary | ICD-10-CM | POA: Diagnosis present

## 2020-08-27 DIAGNOSIS — E1065 Type 1 diabetes mellitus with hyperglycemia: Secondary | ICD-10-CM

## 2020-08-27 HISTORY — PX: I & D EXTREMITY: SHX5045

## 2020-08-27 LAB — GLUCOSE, CAPILLARY
Glucose-Capillary: 160 mg/dL — ABNORMAL HIGH (ref 70–99)
Glucose-Capillary: 176 mg/dL — ABNORMAL HIGH (ref 70–99)
Glucose-Capillary: 344 mg/dL — ABNORMAL HIGH (ref 70–99)
Glucose-Capillary: 356 mg/dL — ABNORMAL HIGH (ref 70–99)
Glucose-Capillary: 398 mg/dL — ABNORMAL HIGH (ref 70–99)

## 2020-08-27 LAB — CBC WITH DIFFERENTIAL/PLATELET
Abs Immature Granulocytes: 0.12 10*3/uL — ABNORMAL HIGH (ref 0.00–0.07)
Abs Immature Granulocytes: 0.21 10*3/uL — ABNORMAL HIGH (ref 0.00–0.07)
Basophils Absolute: 0 10*3/uL (ref 0.0–0.1)
Basophils Absolute: 0.1 10*3/uL (ref 0.0–0.1)
Basophils Relative: 0 %
Basophils Relative: 0 %
Eosinophils Absolute: 0 10*3/uL (ref 0.0–0.5)
Eosinophils Absolute: 0 10*3/uL (ref 0.0–0.5)
Eosinophils Relative: 0 %
Eosinophils Relative: 0 %
HCT: 35 % — ABNORMAL LOW (ref 36.0–46.0)
HCT: 32.6 % — ABNORMAL LOW (ref 36.0–46.0)
Hemoglobin: 11.3 g/dL — ABNORMAL LOW (ref 12.0–15.0)
Hemoglobin: 10.2 g/dL — ABNORMAL LOW (ref 12.0–15.0)
Immature Granulocytes: 1 %
Immature Granulocytes: 1 %
Lymphocytes Relative: 11 %
Lymphocytes Relative: 7 %
Lymphs Abs: 2.3 10*3/uL (ref 0.7–4.0)
Lymphs Abs: 1.5 10*3/uL (ref 0.7–4.0)
MCH: 27.1 pg (ref 26.0–34.0)
MCH: 26.7 pg (ref 26.0–34.0)
MCHC: 32.3 g/dL (ref 30.0–36.0)
MCHC: 31.3 g/dL (ref 30.0–36.0)
MCV: 83.9 fL (ref 80.0–100.0)
MCV: 85.3 fL (ref 80.0–100.0)
Monocytes Absolute: 1 10*3/uL (ref 0.1–1.0)
Monocytes Absolute: 1.2 10*3/uL — ABNORMAL HIGH (ref 0.1–1.0)
Monocytes Relative: 5 %
Monocytes Relative: 6 %
Neutro Abs: 17.7 10*3/uL — ABNORMAL HIGH (ref 1.7–7.7)
Neutro Abs: 18.2 10*3/uL — ABNORMAL HIGH (ref 1.7–7.7)
Neutrophils Relative %: 83 %
Neutrophils Relative %: 86 %
Platelets: 344 10*3/uL (ref 150–400)
Platelets: 323 10*3/uL (ref 150–400)
RBC: 4.17 MIL/uL (ref 3.87–5.11)
RBC: 3.82 MIL/uL — ABNORMAL LOW (ref 3.87–5.11)
RDW: 14.5 % (ref 11.5–15.5)
RDW: 14.5 % (ref 11.5–15.5)
WBC: 21.1 10*3/uL — ABNORMAL HIGH (ref 4.0–10.5)
WBC: 21.2 10*3/uL — ABNORMAL HIGH (ref 4.0–10.5)
nRBC: 0 % (ref 0.0–0.2)
nRBC: 0 % (ref 0.0–0.2)

## 2020-08-27 LAB — COMPREHENSIVE METABOLIC PANEL
ALT: 13 U/L (ref 0–44)
AST: 18 U/L (ref 15–41)
Albumin: 3.7 g/dL (ref 3.5–5.0)
Alkaline Phosphatase: 133 U/L — ABNORMAL HIGH (ref 38–126)
Anion gap: 13 (ref 5–15)
BUN: 12 mg/dL (ref 6–20)
CO2: 23 mmol/L (ref 22–32)
Calcium: 9.4 mg/dL (ref 8.9–10.3)
Chloride: 93 mmol/L — ABNORMAL LOW (ref 98–111)
Creatinine, Ser: 0.94 mg/dL (ref 0.44–1.00)
GFR, Estimated: >60 mL/min (ref >60)
Glucose, Bld: 291 mg/dL — ABNORMAL HIGH (ref 70–99)
Potassium: 3.7 mmol/L (ref 3.5–5.1)
Sodium: 129 mmol/L — ABNORMAL LOW (ref 135–145)
Total Bilirubin: 0.2 mg/dL — ABNORMAL LOW (ref 0.3–1.2)
Total Protein: 8.6 g/dL — ABNORMAL HIGH (ref 6.5–8.1)

## 2020-08-27 LAB — I-STAT BETA HCG BLOOD, ED (MC, WL, AP ONLY): I-stat hCG, quantitative: <5 m[IU]/mL (ref <5)

## 2020-08-27 LAB — SEDIMENTATION RATE: Sed Rate: 85 mm/hr — ABNORMAL HIGH (ref 0–22)

## 2020-08-27 LAB — CBG MONITORING, ED
Glucose-Capillary: 288 mg/dL — ABNORMAL HIGH (ref 70–99)
Glucose-Capillary: 235 mg/dL — ABNORMAL HIGH (ref 70–99)
Glucose-Capillary: 379 mg/dL — ABNORMAL HIGH (ref 70–99)
Glucose-Capillary: 208 mg/dL — ABNORMAL HIGH (ref 70–99)

## 2020-08-27 LAB — C-REACTIVE PROTEIN: CRP: 27.4 mg/dL — ABNORMAL HIGH (ref <1.0)

## 2020-08-27 LAB — SARS CORONAVIRUS 2 (TAT 6-24 HRS): SARS Coronavirus 2: NEGATIVE

## 2020-08-27 LAB — LACTIC ACID, PLASMA
Lactic Acid, Venous: 4 mmol/L (ref 0.5–1.9)
Lactic Acid, Venous: 1.1 mmol/L (ref 0.5–1.9)

## 2020-08-27 SURGERY — IRRIGATION AND DEBRIDEMENT EXTREMITY
Anesthesia: General | Laterality: Left

## 2020-08-27 MED ORDER — ACETAMINOPHEN 650 MG RE SUPP: 650.0000 mg | Freq: Four times a day (QID) | RECTAL | Status: DC | PRN

## 2020-08-27 MED ORDER — ENOXAPARIN SODIUM 40 MG/0.4ML IJ SOSY: 40.0000 mg | PREFILLED_SYRINGE | INTRAMUSCULAR | Status: DC

## 2020-08-27 MED ORDER — FENTANYL CITRATE (PF) 250 MCG/5ML IJ SOLN
INTRAMUSCULAR | Status: AC
  Filled 2020-08-27: qty 5

## 2020-08-27 MED ORDER — PIPERACILLIN-TAZOBACTAM 3.375 G IVPB 30 MIN
3.3750 g | Freq: Once | INTRAVENOUS | Status: AC
  Administered 2020-08-27: 3.375 g via INTRAVENOUS
  Filled 2020-08-27: qty 50

## 2020-08-27 MED ORDER — VANCOMYCIN HCL 1500 MG/300ML IV SOLN
1500.0000 mg | Freq: Once | INTRAVENOUS | Status: AC
  Administered 2020-08-27: 1500 mg via INTRAVENOUS
  Filled 2020-08-27: qty 300

## 2020-08-27 MED ORDER — ONDANSETRON HCL 4 MG/2ML IJ SOLN
INTRAMUSCULAR | Status: DC | PRN
  Administered 2020-08-27: 4 mg via INTRAVENOUS

## 2020-08-27 MED ORDER — PROMETHAZINE HCL 25 MG/ML IJ SOLN: 6.2500 mg | INTRAMUSCULAR | Status: DC | PRN

## 2020-08-27 MED ORDER — HYDROMORPHONE HCL 1 MG/ML IJ SOLN
INTRAMUSCULAR | Status: AC
  Filled 2020-08-27: qty 1

## 2020-08-27 MED ORDER — ONDANSETRON HCL 4 MG/2ML IJ SOLN
4.0000 mg | Freq: Four times a day (QID) | INTRAMUSCULAR | Status: DC | PRN
  Administered 2020-08-29 (×2): 4 mg via INTRAVENOUS
  Filled 2020-08-27 (×2): qty 2

## 2020-08-27 MED ORDER — INSULIN ASPART 100 UNIT/ML IJ SOLN
0.0000 [IU] | Freq: Three times a day (TID) | INTRAMUSCULAR | Status: DC
  Administered 2020-08-27: 15 [IU] via SUBCUTANEOUS

## 2020-08-27 MED ORDER — METOPROLOL TARTRATE 5 MG/5ML IV SOLN
INTRAVENOUS | Status: DC | PRN
  Administered 2020-08-27 (×2): 5 mg via INTRAVENOUS

## 2020-08-27 MED ORDER — MIDAZOLAM HCL 2 MG/2ML IJ SOLN: 0.5000 mg | Freq: Once | INTRAMUSCULAR | Status: DC | PRN

## 2020-08-27 MED ORDER — PROPOFOL 10 MG/ML IV BOLUS
INTRAVENOUS | Status: DC | PRN
  Administered 2020-08-27: 200 mg via INTRAVENOUS

## 2020-08-27 MED ORDER — HYDROMORPHONE HCL 1 MG/ML IJ SOLN
INTRAMUSCULAR | Status: AC
  Filled 2020-08-27: qty 0.5

## 2020-08-27 MED ORDER — 0.9 % SODIUM CHLORIDE (POUR BTL) OPTIME
TOPICAL | Status: DC | PRN
  Administered 2020-08-27: 1000 mL

## 2020-08-27 MED ORDER — ORAL CARE MOUTH RINSE: 15.0000 mL | Freq: Once | OROMUCOSAL | Status: AC

## 2020-08-27 MED ORDER — DEXAMETHASONE SODIUM PHOSPHATE 10 MG/ML IJ SOLN
INTRAMUSCULAR | Status: AC
  Filled 2020-08-27: qty 1

## 2020-08-27 MED ORDER — DEXMEDETOMIDINE (PRECEDEX) IN NS 20 MCG/5ML (4 MCG/ML) IV SYRINGE
PREFILLED_SYRINGE | INTRAVENOUS | Status: DC | PRN
  Administered 2020-08-27: 8 ug via INTRAVENOUS
  Administered 2020-08-27: 12 ug via INTRAVENOUS

## 2020-08-27 MED ORDER — VANCOMYCIN HCL IN DEXTROSE 1-5 GM/200ML-% IV SOLN: 1000.0000 mg | Freq: Once | INTRAVENOUS | Status: DC

## 2020-08-27 MED ORDER — CHLORHEXIDINE GLUCONATE CLOTH 2 % EX PADS
6.0000 | MEDICATED_PAD | Freq: Every day | CUTANEOUS | Status: DC
  Administered 2020-08-27 – 2020-08-31 (×4): 6 via TOPICAL

## 2020-08-27 MED ORDER — KETAMINE HCL 50 MG/5ML IJ SOSY
PREFILLED_SYRINGE | INTRAMUSCULAR | Status: AC
  Filled 2020-08-27: qty 5

## 2020-08-27 MED ORDER — HYDROMORPHONE HCL 1 MG/ML IJ SOLN
0.2500 mg | INTRAMUSCULAR | Status: DC | PRN
  Administered 2020-08-27 (×4): 0.5 mg via INTRAVENOUS

## 2020-08-27 MED ORDER — SUCCINYLCHOLINE CHLORIDE 200 MG/10ML IV SOSY
PREFILLED_SYRINGE | INTRAVENOUS | Status: AC
  Filled 2020-08-27: qty 10

## 2020-08-27 MED ORDER — INSULIN ASPART 100 UNIT/ML IJ SOLN
0.0000 [IU] | INTRAMUSCULAR | Status: DC
  Administered 2020-08-27: 15 [IU] via SUBCUTANEOUS
  Administered 2020-08-27: 5 [IU] via SUBCUTANEOUS
  Administered 2020-08-27: 15 [IU] via SUBCUTANEOUS

## 2020-08-27 MED ORDER — LABETALOL HCL 5 MG/ML IV SOLN
10.0000 mg | Freq: Once | INTRAVENOUS | Status: AC
  Administered 2020-08-27: 10 mg via INTRAVENOUS

## 2020-08-27 MED ORDER — ACETAMINOPHEN 500 MG PO TABS
1000.0000 mg | ORAL_TABLET | Freq: Once | ORAL | Status: AC
  Administered 2020-08-27: 1000 mg via ORAL
  Filled 2020-08-27: qty 2

## 2020-08-27 MED ORDER — KETAMINE HCL 10 MG/ML IJ SOLN
INTRAMUSCULAR | Status: DC | PRN
  Administered 2020-08-27: 30 mg via INTRAVENOUS

## 2020-08-27 MED ORDER — SENNOSIDES-DOCUSATE SODIUM 8.6-50 MG PO TABS: 1.0000 | ORAL_TABLET | Freq: Every evening | ORAL | Status: DC | PRN

## 2020-08-27 MED ORDER — OXYCODONE HCL 5 MG/5ML PO SOLN: 5.0000 mg | Freq: Once | ORAL | Status: AC | PRN

## 2020-08-27 MED ORDER — NICOTINE 7 MG/24HR TD PT24
7.0000 mg | MEDICATED_PATCH | Freq: Every day | TRANSDERMAL | Status: DC
  Administered 2020-08-27 – 2020-08-31 (×5): 7 mg via TRANSDERMAL
  Filled 2020-08-27 (×5): qty 1

## 2020-08-27 MED ORDER — SUCCINYLCHOLINE CHLORIDE 200 MG/10ML IV SOSY
PREFILLED_SYRINGE | INTRAVENOUS | Status: DC | PRN
  Administered 2020-08-27: 120 mg via INTRAVENOUS

## 2020-08-27 MED ORDER — VANCOMYCIN HCL IN DEXTROSE 1-5 GM/200ML-% IV SOLN
1000.0000 mg | Freq: Two times a day (BID) | INTRAVENOUS | Status: DC
  Administered 2020-08-27 – 2020-08-29 (×4): 1000 mg via INTRAVENOUS
  Filled 2020-08-27 (×5): qty 200

## 2020-08-27 MED ORDER — ONDANSETRON HCL 4 MG/2ML IJ SOLN
INTRAMUSCULAR | Status: AC
  Filled 2020-08-27: qty 2

## 2020-08-27 MED ORDER — LACTATED RINGERS IV BOLUS (SEPSIS)
250.0000 mL | Freq: Once | INTRAVENOUS | Status: AC
  Administered 2020-08-27: 250 mL via INTRAVENOUS

## 2020-08-27 MED ORDER — OXYCODONE HCL 5 MG PO TABS
10.0000 mg | ORAL_TABLET | Freq: Four times a day (QID) | ORAL | Status: DC | PRN
  Administered 2020-08-28 – 2020-08-29 (×4): 10 mg via ORAL
  Filled 2020-08-27 (×4): qty 2

## 2020-08-27 MED ORDER — INSULIN GLARGINE-YFGN 100 UNIT/ML ~~LOC~~ SOLN
10.0000 [IU] | Freq: Every day | SUBCUTANEOUS | Status: DC
  Administered 2020-08-27: 10 [IU] via SUBCUTANEOUS
  Filled 2020-08-27 (×2): qty 0.1

## 2020-08-27 MED ORDER — DIPHENHYDRAMINE HCL 50 MG/ML IJ SOLN
INTRAMUSCULAR | Status: AC
  Filled 2020-08-27: qty 1

## 2020-08-27 MED ORDER — INSULIN ASPART 100 UNIT/ML IJ SOLN
2.0000 [IU] | Freq: Once | INTRAMUSCULAR | Status: AC
  Administered 2020-08-27: 2 [IU] via SUBCUTANEOUS

## 2020-08-27 MED ORDER — MIDAZOLAM HCL 2 MG/2ML IJ SOLN
INTRAMUSCULAR | Status: AC
  Filled 2020-08-27: qty 2

## 2020-08-27 MED ORDER — CHLORHEXIDINE GLUCONATE 0.12 % MT SOLN
OROMUCOSAL | Status: AC
  Administered 2020-08-27: 15 mL via OROMUCOSAL
  Filled 2020-08-27: qty 15

## 2020-08-27 MED ORDER — PIPERACILLIN-TAZOBACTAM 3.375 G IVPB 30 MIN: 3.3750 g | Freq: Once | INTRAVENOUS | Status: DC

## 2020-08-27 MED ORDER — CLINDAMYCIN PHOSPHATE 600 MG/50ML IV SOLN
600.0000 mg | Freq: Three times a day (TID) | INTRAVENOUS | Status: AC
  Administered 2020-08-27 – 2020-08-29 (×9): 600 mg via INTRAVENOUS
  Filled 2020-08-27 (×9): qty 50

## 2020-08-27 MED ORDER — HYDROMORPHONE HCL 1 MG/ML IJ SOLN
0.5000 mg | INTRAMUSCULAR | Status: AC | PRN
  Administered 2020-08-27 (×2): 1 mg via INTRAVENOUS
  Filled 2020-08-27: qty 1

## 2020-08-27 MED ORDER — CHLORHEXIDINE GLUCONATE 0.12 % MT SOLN: 15.0000 mL | Freq: Once | OROMUCOSAL | Status: AC

## 2020-08-27 MED ORDER — ONDANSETRON HCL 4 MG PO TABS: 4.0000 mg | ORAL_TABLET | Freq: Four times a day (QID) | ORAL | Status: DC | PRN

## 2020-08-27 MED ORDER — LACTATED RINGERS IV SOLN: INTRAVENOUS | Status: DC

## 2020-08-27 MED ORDER — PROPOFOL 10 MG/ML IV BOLUS
INTRAVENOUS | Status: AC
  Filled 2020-08-27: qty 20

## 2020-08-27 MED ORDER — BUPIVACAINE-EPINEPHRINE 0.5% -1:200000 IJ SOLN
INTRAMUSCULAR | Status: DC | PRN
  Administered 2020-08-27: 50 mL

## 2020-08-27 MED ORDER — ACETAMINOPHEN 325 MG PO TABS
650.0000 mg | ORAL_TABLET | Freq: Four times a day (QID) | ORAL | Status: DC | PRN
  Administered 2020-08-29 – 2020-08-30 (×2): 650 mg via ORAL
  Filled 2020-08-27 (×2): qty 2

## 2020-08-27 MED ORDER — MEPERIDINE HCL 25 MG/ML IJ SOLN: 6.2500 mg | INTRAMUSCULAR | Status: DC | PRN

## 2020-08-27 MED ORDER — TRAZODONE HCL 50 MG PO TABS
25.0000 mg | ORAL_TABLET | Freq: Every evening | ORAL | Status: DC | PRN
  Administered 2020-08-29: 25 mg via ORAL
  Filled 2020-08-27: qty 1

## 2020-08-27 MED ORDER — OXYCODONE HCL 5 MG PO TABS
ORAL_TABLET | ORAL | Status: AC
  Filled 2020-08-27: qty 1

## 2020-08-27 MED ORDER — LIDOCAINE 2% (20 MG/ML) 5 ML SYRINGE
INTRAMUSCULAR | Status: AC
  Filled 2020-08-27: qty 5

## 2020-08-27 MED ORDER — HYDROMORPHONE HCL 1 MG/ML IJ SOLN
INTRAMUSCULAR | Status: AC
  Administered 2020-08-28: 1 mg via INTRAVENOUS
  Filled 2020-08-27: qty 2

## 2020-08-27 MED ORDER — INSULIN ASPART 100 UNIT/ML IJ SOLN
0.0000 [IU] | Freq: Every day | INTRAMUSCULAR | Status: DC
  Administered 2020-08-27: 5 [IU] via SUBCUTANEOUS

## 2020-08-27 MED ORDER — SODIUM CHLORIDE 0.9 % IV SOLN: 2.0000 g | INTRAVENOUS | Status: DC

## 2020-08-27 MED ORDER — INSULIN ASPART 100 UNIT/ML IJ SOLN: 0.0000 [IU] | Freq: Four times a day (QID) | INTRAMUSCULAR | Status: DC | PRN

## 2020-08-27 MED ORDER — BUPIVACAINE-EPINEPHRINE 0.5% -1:200000 IJ SOLN
INTRAMUSCULAR | Status: AC
  Filled 2020-08-27: qty 1

## 2020-08-27 MED ORDER — CLONIDINE HCL 0.1 MG PO TABS
0.1000 mg | ORAL_TABLET | Freq: Three times a day (TID) | ORAL | Status: DC
  Administered 2020-08-27 – 2020-08-28 (×3): 0.1 mg via ORAL
  Filled 2020-08-27 (×3): qty 1

## 2020-08-27 MED ORDER — ENOXAPARIN SODIUM 40 MG/0.4ML IJ SOSY
40.0000 mg | PREFILLED_SYRINGE | INTRAMUSCULAR | Status: DC
Start: 2020-08-27 — End: 2020-08-31
  Administered 2020-08-27 – 2020-08-30 (×4): 40 mg via SUBCUTANEOUS
  Filled 2020-08-27 (×4): qty 0.4

## 2020-08-27 MED ORDER — SODIUM CHLORIDE 0.9 % IV SOLN: INTRAVENOUS | Status: DC | PRN

## 2020-08-27 MED ORDER — MIDAZOLAM HCL 2 MG/2ML IJ SOLN
INTRAMUSCULAR | Status: DC | PRN
  Administered 2020-08-27: 2 mg via INTRAVENOUS

## 2020-08-27 MED ORDER — DIPHENHYDRAMINE HCL 50 MG/ML IJ SOLN
INTRAMUSCULAR | Status: DC | PRN
  Administered 2020-08-27: 12.5 mg via INTRAVENOUS

## 2020-08-27 MED ORDER — HYDROMORPHONE HCL 1 MG/ML IJ SOLN
0.5000 mg | INTRAMUSCULAR | Status: DC | PRN
  Administered 2020-08-28 – 2020-08-29 (×10): 1 mg via INTRAVENOUS
  Filled 2020-08-27 (×11): qty 1

## 2020-08-27 MED ORDER — LACTATED RINGERS IV BOLUS (SEPSIS)
1000.0000 mL | Freq: Once | INTRAVENOUS | Status: AC
  Administered 2020-08-27: 1000 mL via INTRAVENOUS

## 2020-08-27 MED ORDER — PIPERACILLIN-TAZOBACTAM 3.375 G IVPB
3.3750 g | Freq: Three times a day (TID) | INTRAVENOUS | Status: DC
  Administered 2020-08-27 – 2020-08-31 (×11): 3.375 g via INTRAVENOUS
  Filled 2020-08-27 (×10): qty 50

## 2020-08-27 MED ORDER — SODIUM CHLORIDE 0.9 % IV SOLN
2.0000 g | Freq: Once | INTRAVENOUS | Status: AC
  Administered 2020-08-27: 2 g via INTRAVENOUS
  Filled 2020-08-27: qty 20

## 2020-08-27 MED ORDER — LIDOCAINE 2% (20 MG/ML) 5 ML SYRINGE
INTRAMUSCULAR | Status: DC | PRN
  Administered 2020-08-27: 40 mg via INTRAVENOUS

## 2020-08-27 MED ORDER — OXYCODONE HCL 5 MG PO TABS
5.0000 mg | ORAL_TABLET | Freq: Once | ORAL | Status: AC | PRN
  Administered 2020-08-27: 5 mg via ORAL

## 2020-08-27 MED ORDER — HYDROMORPHONE HCL 1 MG/ML IJ SOLN
INTRAMUSCULAR | Status: DC | PRN
  Administered 2020-08-27: .5 mg via INTRAVENOUS

## 2020-08-27 MED ORDER — FENTANYL CITRATE (PF) 250 MCG/5ML IJ SOLN
INTRAMUSCULAR | Status: DC | PRN
  Administered 2020-08-27: 150 ug via INTRAVENOUS
  Administered 2020-08-27: 100 ug via INTRAVENOUS

## 2020-08-27 SURGICAL SUPPLY — 36 items
BAG COUNTER SPONGE SURGICOUNT (BAG) ×2 IMPLANT
BAG SPNG CNTER NS LX DISP (BAG) ×1
BLADE SURG 15 STRL LF DISP TIS (BLADE) ×1 IMPLANT
BLADE SURG 15 STRL SS (BLADE) ×2
CANISTER SUCT 1200ML W/VALVE (MISCELLANEOUS) ×2 IMPLANT
CANISTER WOUND CARE 500ML ATS (WOUND CARE) IMPLANT
CHLORAPREP W/TINT 26 (MISCELLANEOUS) IMPLANT
COVER SURGICAL LIGHT HANDLE (MISCELLANEOUS) ×2 IMPLANT
DRAPE INCISE IOBAN 66X45 STRL (DRAPES) IMPLANT
DRAPE LAPAROTOMY T 98X78 PEDS (DRAPES) IMPLANT
DRSG EMULSION OIL 3X3 NADH (GAUZE/BANDAGES/DRESSINGS) IMPLANT
DRSG PAD ABDOMINAL 8X10 ST (GAUZE/BANDAGES/DRESSINGS) ×1 IMPLANT
DRSG TELFA 3X8 NADH (GAUZE/BANDAGES/DRESSINGS) IMPLANT
DRSG VAC ATS LRG SENSATRAC (GAUZE/BANDAGES/DRESSINGS) IMPLANT
DRSG VAC ATS MED SENSATRAC (GAUZE/BANDAGES/DRESSINGS) IMPLANT
DRSG XEROFORM 1X8 (GAUZE/BANDAGES/DRESSINGS) ×1 IMPLANT
ELECT CAUTERY BLADE 6.4 (BLADE) ×2 IMPLANT
ELECT REM PT RETURN 9FT ADLT (ELECTROSURGICAL) ×2
ELECTRODE REM PT RTRN 9FT ADLT (ELECTROSURGICAL) ×1 IMPLANT
GAUZE SPONGE 4X4 12PLY STRL (GAUZE/BANDAGES/DRESSINGS) ×2 IMPLANT
GAUZE SPONGE 4X4 12PLY STRL LF (GAUZE/BANDAGES/DRESSINGS) ×1 IMPLANT
GLOVE SRG 8 PF TXTR STRL LF DI (GLOVE) ×1 IMPLANT
GLOVE SURG ENC TEXT LTX SZ7.5 (GLOVE) ×4 IMPLANT
GLOVE SURG UNDER POLY LF SZ8 (GLOVE) ×2
GOWN STRL REUS W/ TWL LRG LVL3 (GOWN DISPOSABLE) ×2 IMPLANT
GOWN STRL REUS W/TWL LRG LVL3 (GOWN DISPOSABLE) ×4
KIT BASIN OR (CUSTOM PROCEDURE TRAY) ×2 IMPLANT
KIT TURNOVER KIT A (KITS) ×2 IMPLANT
NS IRRIG 1000ML POUR BTL (IV SOLUTION) ×2 IMPLANT
PACK GENERAL/GYN (CUSTOM PROCEDURE TRAY) ×2 IMPLANT
PAD DRESSING TELFA 3X8 NADH (GAUZE/BANDAGES/DRESSINGS) IMPLANT
SOLUTION BETADINE 4OZ (MISCELLANEOUS) ×2 IMPLANT
SWAB COLLECTION DEVICE MRSA (MISCELLANEOUS) IMPLANT
SYR CONTROL 10ML LL (SYRINGE) ×2 IMPLANT
TOWEL GREEN STERILE (TOWEL DISPOSABLE) ×2 IMPLANT
TOWEL GREEN STERILE FF (TOWEL DISPOSABLE) ×2 IMPLANT

## 2020-08-27 NOTE — H&P (Signed)
History and Physical    Anna Dennis UEA:[id number] DOB: 08-30-1990 DOA: 08/27/2020  PCP: Elbert Ewings, FNP   Patient coming from: Home  Chief Complaint: Left arm swelling and pain, redness  HPI: Anna Dennis is a 30 y.o. 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 States that pain started two days ago and has progressively worsened.  She reports her left hand and arm is now swollen so much she cannot move her fingers wrist or hand.  She has significant pain with palpation or movement of her left arm.  She has developed a small blister on the ventral part of her left wrist that has drained some starting last night.  She reports she has had fever and chills at home.  She admits to injecting heroin into her left wrist multiple times a day for the last few weeks.  She states he has talked with a rehab program and is planning on going through the program to abstain from her IV drug use once this infection is cleared.  She reports she has been out of her Lantus for the past few weeks.  She has been using Novolin R at home that she got at [facility].  She states her blood sugars have been up and down.  She was admitted last month with sepsis and necrotizing fasciitis and abscess of her left arm that required surgical debridement.  ED Course: In the emergency room [name] has been febrile, tachycardic and has had elevated blood pressure readings.  She has had significant pain in her left arm.  White blood cell count 21,100, hemoglobin 11.3, hematocrit 35.0, platelet 344,000, lactic acid level 4.0.  Sodium is 129 but when corrected for hyperglycemia is 132.  Potassium 3.7, chloride 93, bicarb 23, creatinine 0.94, BUN 12 alkaline phosphatase 133, AST 18, ALT 13.  COVID swab is pending.  ER physician discussed with hand surgery is going to evaluate patient this morning.  Patient was  started on antibiotic coverage with vancomycin and Rocephin.  Hospitalist service was asked to admit for further management  Review of Systems:  General: Reports fever, chills. Denies weight loss, night sweats.  Denies dizziness.  Denies change in appetite HENT: Denies head trauma, headache, denies change in hearing, tinnitus.  Denies nasal congestion.  Denies sore throat, Denies difficulty swallowing Eyes: Denies blurry vision, pain in eye, drainage.  Denies discoloration of eyes. Neck: Denies pain.  Denies swelling.  Denies pain with movement. Cardiovascular: Denies chest pain, palpitations.  Denies edema.  Denies orthopnea Respiratory: Denies shortness of breath, cough.  Denies wheezing.  Denies sputum production Gastrointestinal: Denies abdominal pain, swelling.  Denies nausea, vomiting, diarrhea.  Denies melena.  Denies hematemesis. Musculoskeletal: Reports limitation of movement left arm and hand.  Reports left arm/hand swelling and pain.  Genitourinary: Denies pelvic pain.  Denies urinary frequency or hesitancy.  Denies dysuria.  Skin: Denies rash.  Denies petechiae, purpura, ecchymosis. Neurological: Denies syncope. Denies seizure activity. Denies weakness or paresthesia.  Denies slurred speech, drooping face.  Denies visual change. Psychiatric: Denies depression, anxiety. Denies hallucinations.  Past Medical History:  Diagnosis Date   Depression    Diabetes mellitus    Insulin pump   GERD (gastroesophageal reflux disease)    Hepatitis C    Heroin abuse ([place])    Iron deficiency anemia    IVDU (intravenous drug user)    OCD (obsessive compulsive disorder)  Past Surgical History:  Procedure Laterality Date   I & D EXTREMITY Left 07/11/2020   Procedure: IRRIGATION AND DEBRIDEMENT EXTREMITY;  Surgeon: Erle Crocker, MD;  Location: [facility];  Service: Orthopedics;  Laterality: Left;    Social History  reports that she has been smoking cigarettes. She has never used  smokeless tobacco. She reports current alcohol use. She reports current drug use.  No Known Allergies  Family History  Problem Relation Age of Onset   Bipolar disorder Mother    Schizophrenia Sister      Prior to Admission medications   Medication Sig Start Date End Date Taking? Authorizing Provider  Accu-Chek Softclix Lancets lancets Use as directed. 07/14/20   Little Ishikawa, MD  blood glucose meter kit and supplies KIT Dispense based on patient and insurance preference. Use up to four times daily as directed. 07/14/20   Little Ishikawa, MD  citalopram (CELEXA) 20 MG tablet Take 1 tablet (20 mg total) by mouth daily for 30 doses. 07/14/20 08/13/20  Little Ishikawa, MD  glucose blood (GLUCOSE METER TEST) test strip Use as instructed 07/14/20   Little Ishikawa, MD  insulin aspart protamine - aspart (NOVOLOG 70/30 MIX) (70-30) 100 UNIT/ML FlexPen Inject 0.25 mLs (25 Units total) into the skin 2 (two) times daily with a meal. 07/14/20   Little Ishikawa, MD  medroxyPROGESTERone (DEPO-PROVERA) 150 MG/ML injection Inject 150 mg into the muscle every 3 (three) months.    [provider]  TURMERIC CURCUMIN PO Take 1 capsule by mouth daily.    [provider]  insulin aspart (NOVOLOG) 100 UNIT/ML injection Inject 6 Units into the skin 3 (three) times daily with meals. 02/22/12 02/28/12  Reyne Dumas, MD  Omeprazole 20 MG TBEC Take 1 tablet (20 mg total) by mouth daily. 02/28/12 03/04/12  Murlean Iba, MD    Physical Exam: Vitals:   08/27/20 0054 08/27/20 0114 08/27/20 0121 08/27/20 0357  BP: (!) 171/103 (!) 163/101  (!) 177/102  Pulse: (!) 120 (!) 132  (!) 109  Resp: 14 18  15   Temp: (!) 102.5 F (39.2 C) (!) 101.1 F (38.4 C)  (!) 102 F (38.9 C)  TempSrc: Oral Oral  Oral  SpO2: 97% 99%  97%  Weight:   68 kg   Height:   5' 7"  (1.702 m)     Constitutional: NAD, calm, comfortable Vitals:   08/27/20 0054 08/27/20 0114 08/27/20 0121 08/27/20  0357  BP: (!) 171/103 (!) 163/101  (!) 177/102  Pulse: (!) 120 (!) 132  (!) 109  Resp: 14 18  15   Temp: (!) 102.5 F (39.2 C) (!) 101.1 F (38.4 C)  (!) 102 F (38.9 C)  TempSrc: Oral Oral  Oral  SpO2: 97% 99%  97%  Weight:   68 kg   Height:   5' 7"  (1.702 m)    General: WDWN, Alert and oriented x3.  Eyes: EOMI, PERRL, conjunctivae normal.  Sclera nonicteric HENT:  [place]/AT, external ears normal.  Nares patent without epistasis.  Mucous membranes are moist. Neck: Soft, normal range of motion, supple, no masses, no thyromegaly.  Trachea midline Respiratory: clear to auscultation bilaterally, no wheezing, no crackles. Normal respiratory effort. No accessory muscle use.  Cardiovascular: Regular rate and rhythm, Has 2/6 systolic murmur. No rubs / gallops. Has edema of left arm. Abdomen: Soft, no tenderness, nondistended, no rebound or guarding.  No masses palpated. No hepatosplenomegaly. Bowel sounds normoactive Musculoskeletal:  Limited ROM left hand  and arm. Left arm and hand swollen and tight. Has erythema of left hand, wrist and forearm. Left ventral lateral wrist with open draining area, indurated wrist. Left Forearm and hand firm and tender to palpation. No cyanosis. Normal muscle tone.  Skin: Warm, dry, Has erythema and induration of left arm and hand Neurologic: CN 2-12 grossly intact.  Normal speech. Sensation intact in right arm and lower extremities. Strength 5/5 in right upper extremities and bilateral lower extremities.  Psychiatric: Normal judgment and insight. Normal mood.    Labs on Admission: I have personally reviewed following labs and imaging studies  CBC: Recent Labs  Lab 08/27/20 0130  WBC 21.1*  NEUTROABS 17.7*  HGB 11.3*  HCT 35.0*  MCV 83.9  PLT 751    Basic Metabolic Panel: Recent Labs  Lab 08/27/20 0130  NA 129*  K 3.7  CL 93*  CO2 23  GLUCOSE 291*  BUN 12  CREATININE 0.94  CALCIUM 9.4    GFR: Estimated Creatinine Clearance: 84.3 mL/min (by  C-G formula based on SCr of 0.94 mg/dL).  Liver Function Tests: Recent Labs  Lab 08/27/20 0130  AST 18  ALT 13  ALKPHOS 133*  BILITOT 0.2*  PROT 8.6*  ALBUMIN 3.7    Urine analysis:    Component Value Date/Time   COLORURINE COLORLESS (A) 07/25/2016 0730   APPEARANCEUR CLEAR 07/25/2016 0730   LABSPEC 1.025 07/25/2016 0730   PHURINE 6.0 07/25/2016 0730   GLUCOSEU >=500 (A) 07/25/2016 0730   HGBUR NEGATIVE 07/25/2016 0730   BILIRUBINUR NEGATIVE 07/25/2016 0730   KETONESUR 80 (A) 07/25/2016 0730   PROTEINUR NEGATIVE 07/25/2016 0730   UROBILINOGEN 1.0 03/16/2015 1713   NITRITE NEGATIVE 07/25/2016 0730   LEUKOCYTESUR NEGATIVE 07/25/2016 0730    Radiological Exams on Admission: DG Wrist Complete Left  Result Date: 08/27/2020 CLINICAL DATA:  IV drug use.  Swelling, open wound. EXAM: LEFT WRIST - COMPLETE 3+ VIEW COMPARISON:  None. FINDINGS: There is no evidence of fracture or dislocation. No erosion, periosteal reaction, or bone destruction. Diffuse soft tissue edema of the wrist and forearm. No soft tissue air or radiopaque foreign body. IMPRESSION: Diffuse soft tissue edema without acute osseous abnormality. No radiopaque foreign body or soft tissue air. Electronically Signed   By: Keith Rake M.D.   On: 08/27/2020 01:58   DG Hand Complete Left  Result Date: 08/27/2020 CLINICAL DATA:  IV drug use with swelling and open wound. EXAM: LEFT HAND - COMPLETE 3+ VIEW COMPARISON:  Radiograph 07/21/2020. FINDINGS: There is no evidence of fracture or dislocation. No erosion, periosteal reaction, or bone destruction. Marked soft tissue edema of the hand and wrist. No radiopaque foreign body or soft tissue air. IMPRESSION: Marked soft tissue edema of the hand and wrist. No osseous abnormality, radiopaque foreign body or soft tissue air. Electronically Signed   By: Keith Rake M.D.   On: 08/27/2020 01:57     Assessment/Plan Principal Problem:   Sepsis  Anna Dennis is admitted to  Progressive care unit with sepsis.  Meets sepsis criteria with leukocytosis, fever, tachycardia, elevated lactic acid level, cellulitis of her left arm She is started on antibiotic therapy with Rocephin and vancomycin Blood cultures obtained in the emergency room and will be monitored.  Antibiotic will be adjusted if indicated. Patient be given IV fluid bolus in the emergency room. Lactic acid elevated and serial levels will be monitored Patient has lost IV access at this time and not able to have it reestablished.  Will need  PICC line this morning when the PICC team arrives Will need echocardiogram to evaluate for endocarditis and vegetations with IV drug abuse  Active Problems:   Cellulitis of arm, left Patient has significant cellulitis and edema of her left hand and arm to level just above her elbow. Hand surgery has been consulted and will see patient this morning.  Patient has had significant cellulitis of her left arm from IV drug use in the past that required surgical debridement.  Will await hand surgery's evaluation and recommendations. Dilaudid for severe breakthrough pain provided this morning. Patient kept n.p.o. until hand surgery evaluation    Type 1 diabetes mellitus with hyperglycemia  Patient reports she takes Lantus and NovoLog at home.  She states she has been out of her Lantus for the past few weeks. Blood sugars will be monitored every 6 hours while NPO.  Corrective insulin will be provided as needed to keep glycemic control. Patient had hemoglobin A1c last month that was 11.6 so we will not be repeated today    Sinus tachycardia Patient with asymptomatic tachycardia secondary to sepsis and fever. Will monitor on telemetry.    Heroin abuse  Patient reports she is working to get into a rehab program so she can get assistance with abstaining from heroin.  She has been using frequently recently and has been injecting into her left arm and wrist multiple times a day over  the last week.    Leukocytosis Secondary to sepsis and cellulitis. Recheck CBC in am    DVT prophylaxis: Lovenox for DVT prophylaxis.   Code Status:   Full Code  Family Communication:  Diagnosis and plan discussed with patient.  Patient words understanding agrees with plan.  Further recommendations to follow as clinically indicated Disposition Plan:   Patient is from:  Home  Anticipated DC to:  Home  Anticipated DC date:  Anticipate more than 2 midnight stay in the hospital to treat acute condition  Anticipated DC barriers: No barriers to discharge identified at this time  Consults called:  Hand surgery consulted by ER physician and will see pt this am  Admission status:  Inpatient.   Yevonne Aline Vartan Kerins MD [facility]  How to contact the [facility] Attending or Consulting provider [place] or covering provider during after hours [place], for this patient?   Check the care team in [facility] and look for a) attending/consulting [facility] provider listed and b) the [facility] team listed Log into [email or web address] and use [place]'s universal password to access. If you do not have the password, please contact the hospital operator. Locate the [facility] provider you are looking for under [facility] and page to a number that you can be directly reached. If you still have difficulty reaching the provider, please page the [facility] (Director on Call) for the Hospitalists listed on amion for assistance.  08/27/2020, 5:16 AM

## 2020-08-27 NOTE — Progress Notes (Signed)
PROGRESS NOTE  Brief Narrative: Anna Dennis is a 30 y.o. 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 Found to be septic, hyperglycemic. Blood cultures drawn, IV antibiotics given, fluid resuscitation delayed by difficult IV access.   Subjective: Complaining that she asked for another blanket and hasn't gotten it. Pain is controlled at this time. Still shaky with feeling of fever. No chest pain or dyspnea.   Objective: BP (!) 199/105   Pulse 98   Temp (!) 102 F (38.9 C) (Oral)   Resp 19   Ht 5\' 7"  (1.702 m)   Wt 68 kg   SpO2 92%   BMI 23.49 kg/m   Gen: Resting quietly.  Pulm: Clear and nonlabored on room air  CV: Regular tachycardia without definite murmur. No edema GI: Soft, NT, ND, +BS  Neuro: Alert and oriented. Impaired L wrist extension and digit movement.  Skin: Significant LUE edema extending from hand to elbow with focal purulence on volar wrist with some drainage, very warm and tender. Pictured below.     Assessment & Plan: Severe sepsis due to LUE purulent cellulitis:  - Continue vancomycin. Given history of necrotizing infection, we will add zosyn and clindamycin at this time.  - Monitor blood cultures, and anticipated intraoperative culture.  - Surgery consulted, on treatment team. Keep NPO.   T1DM with hyperglycemia: HbA1c 11.6% last month. Ran out of long-acting insulin PTA.  - Give long acting insulin 10u now, start q4h CBG, mod SSI. No current acidosis/anion gap elevation.  Heroin use: No evidence of withdrawal at this time.  - Start clonidine as she's not hypotensive, persistently tachycardic.   Covid PCR still pending.   , MD Pager on amion 08/27/2020, 7:40 AM

## 2020-08-27 NOTE — ED Provider Notes (Signed)
Emergency Medicine Provider Triage Evaluation Note  Anna Dennis , a 30 y.o. female  was evaluated in triage.  Pt complains of wound/abscess to left hand/wrist.  She uses IV heroin regularly.  Admitted to ICU for necrotizing infection to left upper arm in June.  Hx of DM, hep C  Review of Systems  Positive: Wound infection, fever Negative:  Numbness, tingling  Physical Exam  BP (!) 163/101 (BP Location: Right Arm)   Pulse (!) 132   Temp (!) 101.1 F (38.4 C) (Oral)   Resp 18   SpO2 99%   Gen:   Awake, no distress, clammy appearing, warm to touch Resp:  Normal effort  MSK:   Difficulty moving LUE due to wound Other:  Significant swelling of left hand/wrist, there is open wound to volar base of hand/wrist with central necrosis, foul odor, erythema extending throughout hand and volar forearm  Medical Decision Making  Medically screening exam initiated at 1:17 AM.  Appropriate orders placed.  [name] was informed that the remainder of the evaluation will be completed by another provider, this initial triage assessment does not replace that evaluation, and the importance of remaining in the ED until their evaluation is complete.  Febrile, tachycardic with obvious source of infection.  Recent ICU admission for same.  Sepsis protocol initiated.  CBG 288.  Will prioritize room assignment.  Consulting civil engineer notified.   Garlon Hatchet, PA-C 08/27/20 0122    Gilda Crease, MD 08/27/20 [phone]

## 2020-08-27 NOTE — Plan of Care (Signed)

## 2020-08-27 NOTE — Op Note (Signed)
Operative Note   DATE OF OPERATION: 08/27/2020  SURGICAL DEPARTMENT: Plastic Surgery  PREOPERATIVE DIAGNOSES: Left hand infection  POSTOPERATIVE DIAGNOSES:  same  PROCEDURE: 1.  Debridement left wrist wound totaling 4 x 4 cm including skin and subcutaneous tissue 2.  Irrigation and debridement left volar wrist abscess 3.  Open left carpal tunnel release  SURGEON: Ancil Linsey, MD  ASSISTANT: None  ANESTHESIA:  General.   COMPLICATIONS: None.   INDICATIONS FOR PROCEDURE:  The patient, Anna Dennis is a 30 y.o. female born on February 20, 1990, is here for treatment of volar wrist abscess MRN: [id number]  CONSENT:  Informed consent was obtained directly from the patient. Risks, benefits and alternatives were fully discussed. Specific risks including but not limited to bleeding, infection, hematoma, seroma, scarring, pain, contracture, asymmetry, wound healing problems, and need for further surgery were all discussed. The patient did have an ample opportunity to have questions answered to satisfaction.   DESCRIPTION OF PROCEDURE:  The patient was taken to the operating room. SCDs were placed and antibiotics were given.  General anesthesia was administered.  The patient's operative site was prepped and draped in a sterile fashion. A time out was performed and all information was confirmed to be correct.  Started by evaluating the wound.  There was around a 3 to 4 cm diameter area of necrotic tissue right at the volar wrist crease.  I was able to probe this with a hemostat and purulence was expressed.  This was cultured with a culture swab.  The surrounding necrotic tissue was then debrided with pickups and a knife and told about 4 x 4 cm in size.  An Esmarch was used as a forearm tourniquet.  Given the degree of edema and swelling in the hand I elected to perform an open carpal tunnel release to take pressure off the median nerve.  I was able to dissect down through the transverse carpal  ligament with a knife initially and then extended this proximally and distally to the level of the palmar fat pad with a tenotomy scissor.  Median nerve is identified and appear to be intact throughout its course.  Palmaris longus tendon was eroded and unhealthy appearing and was debrided.  I then extended the incision proximally about 6 cm on the forearm in order to explore the volar compartments.  Fascia was incised with tenotomy scissors underlying muscle was identified and appeared to be healthy and contractile.  No purulence was identified beneath the fascia.  Entire wound was then irrigated copiously with saline solution.  Esmarch was removed hemostasis was obtained with cautery.  I was able to place a few 3-0 PDS mattress sutures proximally and distally to close down the wound slightly.  Marcaine with epinephrine soaked 4 x 4 was then placed in the wound and a soft wrap was applied.  The patient tolerated the procedure well.  There were no complications. The patient was allowed to wake from anesthesia, extubated and taken to the recovery room in satisfactory condition.

## 2020-08-27 NOTE — H&P (View-Only) (Signed)
Reason for Consult/CC: Left hand infection  Anna Dennis is an 30 y.o. female.  HPI: Patient presents with a left hand infection.  She is a chronic IV drug user.  She has had multiple soft tissue infections in the past related to this.  She is also an uncontrolled by a diabetic.  She reports a small ulcer formed on the volar aspect of her left wrist and progressed to get more red painful and swollen over time.  Allergies: No Known Allergies  Medications:  Current Facility-Administered Medications:    clindamycin (CLEOCIN) IVPB 600 mg, 600 mg, Intravenous, Q8H, [place] Puna, Ryan B, MD   cloNIDine (CATAPRES) tablet 0.1 mg, 0.1 mg, Oral, TID, [place] Puna, Ryan B, MD   insulin aspart (novoLOG) injection 0-15 Units, 0-15 Units, Subcutaneous, Q4H, [place] Puna, Ryan B, MD   insulin glargine-yfgn (SEMGLEE) injection 10 Units, 10 Units, Subcutaneous, Daily, Patrecia Pour, MD   lactated ringers infusion, , Intravenous, Continuous, [place]ina, Gwenyth Allegra, MD, Last Rate: 150 mL/hr at 08/27/20 0634, New Bag at 08/27/20 0634   piperacillin-tazobactam (ZOSYN) IVPB 3.375 g, 3.375 g, Intravenous, Once, Patrecia Pour, MD   vancomycin (VANCOCIN) IVPB 1000 mg/200 mL premix, 1,000 mg, Intravenous, Q12H, Erenest Blank, RPH   vancomycin (VANCOREADY) IVPB 1500 mg/300 mL, 1,500 mg, Intravenous, Once, Pollina, Gwenyth Allegra, MD, Last Rate: 150 mL/hr at 08/27/20 8891, 1,500 mg at 08/27/20 6945  Current Outpatient Medications:    Accu-Chek Softclix Lancets lancets, Use as directed., Disp: 100 each, Rfl: 5   blood glucose meter kit and supplies KIT, Dispense based on patient and insurance preference. Use up to four times daily as directed., Disp: 1 each, Rfl: 0   citalopram (CELEXA) 20 MG tablet, Take 1 tablet (20 mg total) by mouth daily for 30 doses., Disp: 30 tablet, Rfl: 0   glucose blood (GLUCOSE METER TEST) test strip, Use as instructed, Disp: 100 each, Rfl: 5   insulin aspart protamine - aspart (NOVOLOG 70/30 MIX) (70-30) 100  UNIT/ML FlexPen, Inject 0.25 mLs (25 Units total) into the skin 2 (two) times daily with a meal., Disp: 15 mL, Rfl: 0   medroxyPROGESTERone (DEPO-PROVERA) 150 MG/ML injection, Inject 150 mg into the muscle every 3 (three) months., Disp: , Rfl:    TURMERIC CURCUMIN PO, Take 1 capsule by mouth daily., Disp: , Rfl:   Past Medical History:  Diagnosis Date   Depression    Diabetes mellitus    Insulin pump   GERD (gastroesophageal reflux disease)    Hepatitis C    Heroin abuse ([place])    Iron deficiency anemia    IVDU (intravenous drug user)    OCD (obsessive compulsive disorder)     Past Surgical History:  Procedure Laterality Date   I & D EXTREMITY Left 07/11/2020   Procedure: IRRIGATION AND DEBRIDEMENT EXTREMITY;  Surgeon: Erle Crocker, MD;  Location: [place];  Service: Orthopedics;  Laterality: Left;    Family History  Problem Relation Age of Onset   Bipolar disorder Mother    Schizophrenia Sister     Social History:  reports that she has been smoking cigarettes. She has never used smokeless tobacco. She reports current alcohol use. She reports current drug use.  Physical Exam Blood pressure (!) 199/105, pulse 98, temperature (!) 102 F (38.9 C), temperature source Oral, resp. rate 19, height _0  (1.702 m), weight 68 kg, SpO2 92 %. General: Alert and oriented Left hand: Fingers are well-perfused with normal capillary refill palp radial pulse.  She does  have sensation but it feels different to her than usual.  Flexion is limited due to pain and swelling but she can activate all flexor and extensor tendons.  She has a 2 cm ulcerative area near the volar wrist flexion crease.  There is some purulent drainage.  There is diffuse edema and cellulitis extending up to the mid forearm level.  Compartments in the forearm do not feel tight.   Results for orders placed or performed during the hospital encounter of 08/27/20 (from the past 48 hour(s))  POC CBG, ED     Status: Abnormal    Collection Time: 08/27/20  1:19 AM  Result Value Ref Range   Glucose-Capillary 288 (H) 70 - 99 mg/dL    Comment: Glucose reference range applies only to samples taken after fasting for at least 8 hours.   Comment 1 Notify RN   CBC with Differential     Status: Abnormal   Collection Time: 08/27/20  1:30 AM  Result Value Ref Range   WBC 21.1 (H) 4.0 - 10.5 K/uL   RBC 4.17 3.87 - 5.11 MIL/uL   Hemoglobin 11.3 (L) 12.0 - 15.0 g/dL   HCT 35.0 (L) 36.0 - 46.0 %   MCV 83.9 80.0 - 100.0 fL   MCH 27.1 26.0 - 34.0 pg   MCHC 32.3 30.0 - 36.0 g/dL   RDW 14.5 11.5 - 15.5 %   Platelets 344 150 - 400 K/uL   nRBC 0.0 0.0 - 0.2 %   Neutrophils Relative % 83 %   Neutro Abs 17.7 (H) 1.7 - 7.7 K/uL   Lymphocytes Relative 11 %   Lymphs Abs 2.3 0.7 - 4.0 K/uL   Monocytes Relative 5 %   Monocytes Absolute 1.0 0.1 - 1.0 K/uL   Eosinophils Relative 0 %   Eosinophils Absolute 0.0 0.0 - 0.5 K/uL   Basophils Relative 0 %   Basophils Absolute 0.0 0.0 - 0.1 K/uL   Immature Granulocytes 1 %   Abs Immature Granulocytes 0.12 (H) 0.00 - 0.07 K/uL    Comment: Performed at [facility], [address], [place], [place] [address]  Comprehensive metabolic panel     Status: Abnormal   Collection Time: 08/27/20  1:30 AM  Result Value Ref Range   Sodium 129 (L) 135 - 145 mmol/L   Potassium 3.7 3.5 - 5.1 mmol/L   Chloride 93 (L) 98 - 111 mmol/L   CO2 23 22 - 32 mmol/L   Glucose, Bld 291 (H) 70 - 99 mg/dL    Comment: Glucose reference range applies only to samples taken after fasting for at least 8 hours.   BUN 12 6 - 20 mg/dL   Creatinine, Ser 0.94 0.44 - 1.00 mg/dL   Calcium 9.4 8.9 - 10.3 mg/dL   Total Protein 8.6 (H) 6.5 - 8.1 g/dL   Albumin 3.7 3.5 - 5.0 g/dL   AST 18 15 - 41 U/L   ALT 13 0 - 44 U/L   Alkaline Phosphatase 133 (H) 38 - 126 U/L   Total Bilirubin 0.2 (L) 0.3 - 1.2 mg/dL   GFR, Estimated >60 >60 mL/min    Comment: (NOTE) Calculated using the CKD-EPI Creatinine Equation (2021)     Anion gap 13 5 - 15    Comment: Performed at [facility], [place] [address], [place], [place] [address]  Lactic acid, plasma     Status: Abnormal   Collection Time: 08/27/20  1:31 AM  Result Value Ref Range  Lactic Acid, Venous 4.0 (HH) 0.5 - 1.9 mmol/L    Comment: CRITICAL RESULT CALLED TO, READ BACK BY AND VERIFIED WITH: NUCKLES C,RN 08/27/20 0228 WAYK Performed at [facility], [place] [address], [place], [place] [address]   CBG monitoring, ED     Status: Abnormal   Collection Time: 08/27/20  5:27 AM  Result Value Ref Range   Glucose-Capillary 235 (H) 70 - 99 mg/dL    Comment: Glucose reference range applies only to samples taken after fasting for at least 8 hours.   Comment 1 Notify RN    Comment 2 Document in Chart   C-reactive protein     Status: Abnormal   Collection Time: 08/27/20  5:42 AM  Result Value Ref Range   CRP 27.4 (H) <1.0 mg/dL    Comment: Performed at [facility] [address] [place], [place] [address]    DG Wrist Complete Left  Result Date: 08/27/2020 CLINICAL DATA:  IV drug use.  Swelling, open wound. EXAM: LEFT WRIST - COMPLETE 3+ VIEW COMPARISON:  None. FINDINGS: There is no evidence of fracture or dislocation. No erosion, periosteal reaction, or bone destruction. Diffuse soft tissue edema of the wrist and forearm. No soft tissue air or radiopaque foreign body. IMPRESSION: Diffuse soft tissue edema without acute osseous abnormality. No radiopaque foreign body or soft tissue air. Electronically Signed   By: Keith Rake M.D.   On: 08/27/2020 01:58   DG Hand Complete Left  Result Date: 08/27/2020 CLINICAL DATA:  IV drug use with swelling and open wound. EXAM: LEFT HAND - COMPLETE 3+ VIEW COMPARISON:  Radiograph 07/21/2020. FINDINGS: There is no evidence of fracture or dislocation. No erosion, periosteal reaction, or bone destruction. Marked soft tissue edema of the hand and wrist. No radiopaque foreign body or soft tissue air. IMPRESSION:  Marked soft tissue edema of the hand and wrist. No osseous abnormality, radiopaque foreign body or soft tissue air. Electronically Signed   By: Keith Rake M.D.   On: 08/27/2020 01:57   [place] EKG SITE RITE  Result Date: 08/27/2020 If Site Rite image not attached, placement could not be confirmed due to current cardiac rhythm.   Assessment/Plan: Patient presents with soft tissue infection of the left hand and forearm.  No obvious abscess at the moment but due to the severity of the changes in her systemic symptoms will take to the operating room today to debride that area and identify any deeper undrained pockets.  She does have a CT scan which is pending which will likely help guide that.  In the meantime agree with antibiotics and blood sugar control as best as possible.  Case is posted for this afternoon to allow some resuscitation and further work-up to take place.  Cindra Presume 08/27/2020, 8:12 AM

## 2020-08-27 NOTE — ED Provider Notes (Signed)
[facility] EMERGENCY DEPARTMENT Provider Note   CSN: [id number] Arrival date & time: 08/27/20  0050     History Chief Complaint  Patient presents with   Abscess    Anna Dennis is a 30 y.o. female.  Patient presents to the emergency department for evaluation of pain and swelling of left arm.  Patient has a history of IV drug abuse.  She admits to injecting in this region.  She has had progressive worsening pain, swelling and fever.  No significant drainage from the site.      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   Hyperkalemia 09/05/2015   Syncope 09/05/2015   Acute kidney injury (nontraumatic) ([facility])    Heroin abuse ([place])    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   Chronic hepatitis C without hepatic coma ([place]) 08/10/2014   Dehydration 02/20/2012   Sinus tachycardia 02/20/2012   Leukocytosis 02/19/2012   GERD (gastroesophageal reflux disease) 02/19/2012   DKA (diabetic ketoacidoses) ([place]) 01/19/2012   Type 1 diabetes mellitus with hyperosmolarity without nonketotic hyperglycemic hyperosmolar coma ([place]) 03/14/2009   Depression 03/14/2009    Past Surgical History:  Procedure Laterality Date   I & D EXTREMITY Left 07/11/2020   Procedure: IRRIGATION AND DEBRIDEMENT EXTREMITY;  Surgeon: Erle Crocker, MD;  Location: [facility];  Service: Orthopedics;  Laterality: Left;     OB History   No obstetric history on file.     Family History  Problem Relation Age of Onset   Bipolar disorder Mother     Schizophrenia Sister     Social History   Tobacco Use   Smoking status: Every Day    Types: Cigarettes   Smokeless tobacco: Never  Substance Use Topics   Alcohol use: Yes    Alcohol/week: 0.0 standard drinks    Comment: occasional   Drug use: Yes    Comment: patient states clean since 03/29/14    Home Medications Prior to Admission medications   Medication Sig Start Date End Date Taking? Authorizing Provider  Accu-Chek Softclix Lancets lancets Use as directed. 07/14/20   Little Ishikawa, MD  blood glucose meter kit and supplies KIT Dispense based on patient and insurance preference. Use up to four times daily as directed. 07/14/20   Little Ishikawa, MD  citalopram (CELEXA) 20 MG tablet Take 1 tablet (20 mg total) by mouth daily for 30 doses. 07/14/20 08/13/20  Little Ishikawa, MD  glucose blood (GLUCOSE METER TEST) test strip Use as instructed 07/14/20   Little Ishikawa, MD  insulin aspart protamine - aspart (NOVOLOG 70/30 MIX) (70-30) 100 UNIT/ML FlexPen Inject 0.25 mLs (25 Units total) into the skin 2 (two) times daily with a meal. 07/14/20   Little Ishikawa, MD  medroxyPROGESTERone (DEPO-PROVERA) 150 MG/ML injection Inject 150 mg into the muscle every 3 (three) months.    [provider]  TURMERIC CURCUMIN PO Take 1 capsule by mouth daily.    [provider]  insulin  aspart (NOVOLOG) 100 UNIT/ML injection Inject 6 Units into the skin 3 (three) times daily with meals. 02/22/12 02/28/12  Reyne Dumas, MD  Omeprazole 20 MG TBEC Take 1 tablet (20 mg total) by mouth daily. 02/28/12 03/04/12  Murlean Iba, MD    Allergies    Patient has no known allergies.  Review of Systems   Review of Systems  Constitutional:  Positive for fever.  Skin:  Positive for color change and wound.  All other systems reviewed and are negative.  Physical Exam Updated Vital Signs BP (!) 199/105   Pulse 98   Temp (!) 102 F (38.9 C) (Oral)   Resp 19   Ht _0   (1.702 m)   Wt 68 kg   SpO2 92%   BMI 23.49 kg/m   Physical Exam Vitals and nursing note reviewed.  Constitutional:      General: She is not in acute distress.    Appearance: Normal appearance. She is well-developed.  HENT:     Head: Normocephalic and atraumatic.     Right Ear: Hearing normal.     Left Ear: Hearing normal.     Nose: Nose normal.  Eyes:     Conjunctiva/sclera: Conjunctivae normal.     Pupils: Pupils are equal, round, and reactive to light.  Cardiovascular:     Rate and Rhythm: Regular rhythm.     Heart sounds: S1 normal and S2 normal. No murmur heard.   No friction rub. No gallop.  Pulmonary:     Effort: Pulmonary effort is normal. No respiratory distress.     Breath sounds: Normal breath sounds.  Chest:     Chest wall: No tenderness.  Abdominal:     General: Bowel sounds are normal.     Palpations: Abdomen is soft.     Tenderness: There is no abdominal tenderness. There is no guarding or rebound. Negative signs include Murphy's sign and McBurney's sign.     Hernia: No hernia is present.  Musculoskeletal:     Left wrist: Swelling and tenderness present. Decreased range of motion.     Cervical back: Normal range of motion and neck supple.  Skin:    General: Skin is warm and dry.     Findings: Erythema and wound present. No rash.  Neurological:     Mental Status: She is alert and oriented to person, place, and time.     GCS: GCS eye subscore is 4. GCS verbal subscore is 5. GCS motor subscore is 6.     Cranial Nerves: No cranial nerve deficit.     Sensory: No sensory deficit.     Coordination: Coordination normal.  Psychiatric:        Speech: Speech normal.        Behavior: Behavior normal.        Thought Content: Thought content normal.       ED Results / Procedures / Treatments   Labs (all labs ordered are listed, but only abnormal results are displayed) Labs Reviewed  CBC WITH DIFFERENTIAL/PLATELET - Abnormal; Notable for the following  components:      Result Value   WBC 21.1 (*)    Hemoglobin 11.3 (*)    HCT 35.0 (*)    Neutro Abs 17.7 (*)    Abs Immature Granulocytes 0.12 (*)    All other components within normal limits  COMPREHENSIVE METABOLIC PANEL - Abnormal; Notable for the following components:   Sodium 129 (*)    Chloride 93 (*)  Glucose, Bld 291 (*)    Total Protein 8.6 (*)    Alkaline Phosphatase 133 (*)    Total Bilirubin 0.2 (*)    All other components within normal limits  LACTIC ACID, PLASMA - Abnormal; Notable for the following components:   Lactic Acid, Venous 4.0 (*)    All other components within normal limits  C-REACTIVE PROTEIN - Abnormal; Notable for the following components:   CRP 27.4 (*)    All other components within normal limits  CBG MONITORING, ED - Abnormal; Notable for the following components:   Glucose-Capillary 288 (*)    All other components within normal limits  CBG MONITORING, ED - Abnormal; Notable for the following components:   Glucose-Capillary 235 (*)    All other components within normal limits  CULTURE, BLOOD (ROUTINE X 2)  CULTURE, BLOOD (ROUTINE X 2)  SARS CORONAVIRUS 2 (TAT 6-24 HRS)  SEDIMENTATION RATE  LACTIC ACID, PLASMA    EKG None  Radiology DG Wrist Complete Left  Result Date: 08/27/2020 CLINICAL DATA:  IV drug use.  Swelling, open wound. EXAM: LEFT WRIST - COMPLETE 3+ VIEW COMPARISON:  None. FINDINGS: There is no evidence of fracture or dislocation. No erosion, periosteal reaction, or bone destruction. Diffuse soft tissue edema of the wrist and forearm. No soft tissue air or radiopaque foreign body. IMPRESSION: Diffuse soft tissue edema without acute osseous abnormality. No radiopaque foreign body or soft tissue air. Electronically Signed   By: Keith Rake M.D.   On: 08/27/2020 01:58   DG Hand Complete Left  Result Date: 08/27/2020 CLINICAL DATA:  IV drug use with swelling and open wound. EXAM: LEFT HAND - COMPLETE 3+ VIEW COMPARISON:   Radiograph 07/21/2020. FINDINGS: There is no evidence of fracture or dislocation. No erosion, periosteal reaction, or bone destruction. Marked soft tissue edema of the hand and wrist. No radiopaque foreign body or soft tissue air. IMPRESSION: Marked soft tissue edema of the hand and wrist. No osseous abnormality, radiopaque foreign body or soft tissue air. Electronically Signed   By: Keith Rake M.D.   On: 08/27/2020 01:57   [place] EKG SITE RITE  Result Date: 08/27/2020 If Site Rite image not attached, placement could not be confirmed due to current cardiac rhythm.   Procedures Procedures   Medications Ordered in ED Medications  lactated ringers infusion ( Intravenous New Bag/Given 08/27/20 0634)  vancomycin (VANCOREADY) IVPB 1500 mg/300 mL (1,500 mg Intravenous New Bag/Given 08/27/20 6294)  vancomycin (VANCOCIN) IVPB 1000 mg/200 mL premix (has no administration in time range)  acetaminophen (TYLENOL) tablet 1,000 mg (1,000 mg Oral Given 08/27/20 0601)  lactated ringers bolus 1,000 mL (0 mLs Intravenous Stopped 08/27/20 0706)    And  lactated ringers bolus 1,000 mL (0 mLs Intravenous Stopped 08/27/20 0706)    And  lactated ringers bolus 250 mL (0 mLs Intravenous Stopped 08/27/20 0701)  cefTRIAXone (ROCEPHIN) 2 g in sodium chloride 0.9 % 100 mL IVPB (0 g Intravenous Stopped 08/27/20 0647)  insulin aspart (novoLOG) injection 2 Units (2 Units Subcutaneous Given 08/27/20 0609)    ED Course  I have reviewed the triage vital signs and the nursing notes.  Pertinent labs & imaging results that were available during my care of the patient were reviewed by me and considered in my medical decision making (see chart for details).    MDM Rules/Calculators/A&P  Patient presents to the emergency department for fever, pain, swelling of the left arm.  Patient reports that she recently was hospitalized in the ICU for a similar infection on the upper arm.  She required surgical  drainage/debridement at that time.  She is now experiencing similar symptoms in the wrist after injecting in that region.  Patient with significant erythema, warmth, swelling and edema of the wrist.  There is a pustular region with a small area of eschar/necrosis in the center.  She has significantly limited range of motion of the wrist, likely secondary to extensive soft tissue swelling, septic arthritis needs to be considered.  Discussed with Dr. Claudia Desanctis, on-call for hand surgery.  He will see patient in consultation.  Admit to hospitalist service.  Final Clinical Impression(s) / ED Diagnoses Final diagnoses:  Cellulitis of left upper extremity    Rx / DC Orders ED Discharge Orders     None        Orpah Greek, MD 08/27/20 [phone]

## 2020-08-27 NOTE — ED Notes (Signed)
Report given to Elizabeth M, RN & Travis, RN   

## 2020-08-27 NOTE — Interval H&P Note (Signed)
Patient seen and examined. Wrists and benefits discussed. Proceed with surgery.

## 2020-08-27 NOTE — Progress Notes (Signed)
Pharmacy Antibiotic Note  Anna Dennis is a 30 y.o. female admitted on 08/27/2020 with  abscess .  Pharmacy has been consulted for Vancomycin dosing. History of the same. WBC elevated. Renal function good. Lactic acid elevated.   Plan: Vancomycin 1500 mg IV x 1, then 1000 mg IV q12h >>>Estimated AUC: 549 Trend WBC, temp, renal function  F/U infectious work-up Drug levels as indicated   Height: 5\' 7"  (170.2 cm) Weight: 68 kg (150 lb) IBW/kg (Calculated) : 61.6  Temp (24hrs), Avg:101.9 F (38.8 C), Min:101.1 F (38.4 C), Max:102.5 F (39.2 C)  Recent Labs  Lab 08/27/20 0130 08/27/20 0131  WBC 21.1*  --   CREATININE 0.94  --   LATICACIDVEN  --  4.0*    Estimated Creatinine Clearance: 84.3 mL/min (by C-G formula based on SCr of 0.94 mg/dL).    No Known Allergies  08/29/20, PharmD, BCPS Clinical Pharmacist Phone: [phone]

## 2020-08-27 NOTE — ED Notes (Signed)
Patient transported to CT 

## 2020-08-27 NOTE — ED Triage Notes (Signed)
Pt with abscess to left hand.  Similar abscess pt states on left upper arm in June that resulted in ICU admission and surgery.  Pt tachycardic and febrile in triage.

## 2020-08-27 NOTE — Progress Notes (Signed)
Patient underwent incision and drainage of left wrist abscess today in the OR.  There was debridement and purulence encountered.  All this was evacuated.  There is no purulence deep to the fascia and all remaining tissue appears healthy at this point.  Would recommend wet-to-dry dressings over right volar soft tissue wound.  This should be done twice a day.  We will recommend soaking the entire hand and wrist with saline after removal of the 4 x 4 packing for 5 minutes and then drying off the hand and reapplying the wet to dry dressing.  This only needs to be 1 moistened 4 x 4 as packing.  Would recommend elevating the hand and continuing with aggressive antibiotic therapy until cultures speciate.  Call with any questions.

## 2020-08-27 NOTE — Consult Note (Signed)
Reason for Consult/CC: Left hand infection  Anna Dennis is an 30 y.o. female.  HPI: Patient presents with a left hand infection.  She is a chronic IV drug user.  She has had multiple soft tissue infections in the past related to this.  She is also an uncontrolled by a diabetic.  She reports a small ulcer formed on the volar aspect of her left wrist and progressed to get more red painful and swollen over time.  Allergies: No Known Allergies  Medications:  Current Facility-Administered Medications:    clindamycin (CLEOCIN) IVPB 600 mg, 600 mg, Intravenous, Q8H, [place] Puna, Ryan B, MD   cloNIDine (CATAPRES) tablet 0.1 mg, 0.1 mg, Oral, TID, [place] Puna, Ryan B, MD   insulin aspart (novoLOG) injection 0-15 Units, 0-15 Units, Subcutaneous, Q4H, [place] Puna, Ryan B, MD   insulin glargine-yfgn (SEMGLEE) injection 10 Units, 10 Units, Subcutaneous, Daily, Patrecia Pour, MD   lactated ringers infusion, , Intravenous, Continuous, Pollina, Gwenyth Allegra, MD, Last Rate: 150 mL/hr at 08/27/20 0634, New Bag at 08/27/20 0634   piperacillin-tazobactam (ZOSYN) IVPB 3.375 g, 3.375 g, Intravenous, Once, Patrecia Pour, MD   vancomycin (VANCOCIN) IVPB 1000 mg/200 mL premix, 1,000 mg, Intravenous, Q12H, Erenest Blank, RPH   vancomycin (VANCOREADY) IVPB 1500 mg/300 mL, 1,500 mg, Intravenous, Once, Pollina, Gwenyth Allegra, MD, Last Rate: 150 mL/hr at 08/27/20 8891, 1,500 mg at 08/27/20 6945  Current Outpatient Medications:    Accu-Chek Softclix Lancets lancets, Use as directed., Disp: 100 each, Rfl: 5   blood glucose meter kit and supplies KIT, Dispense based on patient and insurance preference. Use up to four times daily as directed., Disp: 1 each, Rfl: 0   citalopram (CELEXA) 20 MG tablet, Take 1 tablet (20 mg total) by mouth daily for 30 doses., Disp: 30 tablet, Rfl: 0   glucose blood (GLUCOSE METER TEST) test strip, Use as instructed, Disp: 100 each, Rfl: 5   insulin aspart protamine - aspart (NOVOLOG 70/30 MIX) (70-30) 100  UNIT/ML FlexPen, Inject 0.25 mLs (25 Units total) into the skin 2 (two) times daily with a meal., Disp: 15 mL, Rfl: 0   medroxyPROGESTERone (DEPO-PROVERA) 150 MG/ML injection, Inject 150 mg into the muscle every 3 (three) months., Disp: , Rfl:    TURMERIC CURCUMIN PO, Take 1 capsule by mouth daily., Disp: , Rfl:   Past Medical History:  Diagnosis Date   Depression    Diabetes mellitus    Insulin pump   GERD (gastroesophageal reflux disease)    Hepatitis C    Heroin abuse ([place])    Iron deficiency anemia    IVDU (intravenous drug user)    OCD (obsessive compulsive disorder)     Past Surgical History:  Procedure Laterality Date   I & D EXTREMITY Left 07/11/2020   Procedure: IRRIGATION AND DEBRIDEMENT EXTREMITY;  Surgeon: Erle Crocker, MD;  Location: [place];  Service: Orthopedics;  Laterality: Left;    Family History  Problem Relation Age of Onset   Bipolar disorder Mother    Schizophrenia Sister     Social History:  reports that she has been smoking cigarettes. She has never used smokeless tobacco. She reports current alcohol use. She reports current drug use.  Physical Exam Blood pressure (!) 199/105, pulse 98, temperature (!) 102 F (38.9 C), temperature source Oral, resp. rate 19, height _0  (1.702 m), weight 68 kg, SpO2 92 %. General: Alert and oriented Left hand: Fingers are well-perfused with normal capillary refill palp radial pulse.  She does  have sensation but it feels different to her than usual.  Flexion is limited due to pain and swelling but she can activate all flexor and extensor tendons.  She has a 2 cm ulcerative area near the volar wrist flexion crease.  There is some purulent drainage.  There is diffuse edema and cellulitis extending up to the mid forearm level.  Compartments in the forearm do not feel tight.   Results for orders placed or performed during the hospital encounter of 08/27/20 (from the past 48 hour(s))  POC CBG, ED     Status: Abnormal    Collection Time: 08/27/20  1:19 AM  Result Value Ref Range   Glucose-Capillary 288 (H) 70 - 99 mg/dL    Comment: Glucose reference range applies only to samples taken after fasting for at least 8 hours.   Comment 1 Notify RN   CBC with Differential     Status: Abnormal   Collection Time: 08/27/20  1:30 AM  Result Value Ref Range   WBC 21.1 (H) 4.0 - 10.5 K/uL   RBC 4.17 3.87 - 5.11 MIL/uL   Hemoglobin 11.3 (L) 12.0 - 15.0 g/dL   HCT 35.0 (L) 36.0 - 46.0 %   MCV 83.9 80.0 - 100.0 fL   MCH 27.1 26.0 - 34.0 pg   MCHC 32.3 30.0 - 36.0 g/dL   RDW 14.5 11.5 - 15.5 %   Platelets 344 150 - 400 K/uL   nRBC 0.0 0.0 - 0.2 %   Neutrophils Relative % 83 %   Neutro Abs 17.7 (H) 1.7 - 7.7 K/uL   Lymphocytes Relative 11 %   Lymphs Abs 2.3 0.7 - 4.0 K/uL   Monocytes Relative 5 %   Monocytes Absolute 1.0 0.1 - 1.0 K/uL   Eosinophils Relative 0 %   Eosinophils Absolute 0.0 0.0 - 0.5 K/uL   Basophils Relative 0 %   Basophils Absolute 0.0 0.0 - 0.1 K/uL   Immature Granulocytes 1 %   Abs Immature Granulocytes 0.12 (H) 0.00 - 0.07 K/uL    Comment: Performed at [facility], [address], [place], [place] [address]  Comprehensive metabolic panel     Status: Abnormal   Collection Time: 08/27/20  1:30 AM  Result Value Ref Range   Sodium 129 (L) 135 - 145 mmol/L   Potassium 3.7 3.5 - 5.1 mmol/L   Chloride 93 (L) 98 - 111 mmol/L   CO2 23 22 - 32 mmol/L   Glucose, Bld 291 (H) 70 - 99 mg/dL    Comment: Glucose reference range applies only to samples taken after fasting for at least 8 hours.   BUN 12 6 - 20 mg/dL   Creatinine, Ser 0.94 0.44 - 1.00 mg/dL   Calcium 9.4 8.9 - 10.3 mg/dL   Total Protein 8.6 (H) 6.5 - 8.1 g/dL   Albumin 3.7 3.5 - 5.0 g/dL   AST 18 15 - 41 U/L   ALT 13 0 - 44 U/L   Alkaline Phosphatase 133 (H) 38 - 126 U/L   Total Bilirubin 0.2 (L) 0.3 - 1.2 mg/dL   GFR, Estimated >60 >60 mL/min    Comment: (NOTE) Calculated using the CKD-EPI Creatinine Equation (2021)     Anion gap 13 5 - 15    Comment: Performed at [facility], [place] [address], [place], [place] [address]  Lactic acid, plasma     Status: Abnormal   Collection Time: 08/27/20  1:31 AM  Result Value Ref Range  Lactic Acid, Venous 4.0 (HH) 0.5 - 1.9 mmol/L    Comment: CRITICAL RESULT CALLED TO, READ BACK BY AND VERIFIED WITH: NUCKLES C,RN 08/27/20 0228 WAYK Performed at [facility], [place] [address], [place], [place] [address]   CBG monitoring, ED     Status: Abnormal   Collection Time: 08/27/20  5:27 AM  Result Value Ref Range   Glucose-Capillary 235 (H) 70 - 99 mg/dL    Comment: Glucose reference range applies only to samples taken after fasting for at least 8 hours.   Comment 1 Notify RN    Comment 2 Document in Chart   C-reactive protein     Status: Abnormal   Collection Time: 08/27/20  5:42 AM  Result Value Ref Range   CRP 27.4 (H) <1.0 mg/dL    Comment: Performed at [facility] [address] [place], [place] [address]    DG Wrist Complete Left  Result Date: 08/27/2020 CLINICAL DATA:  IV drug use.  Swelling, open wound. EXAM: LEFT WRIST - COMPLETE 3+ VIEW COMPARISON:  None. FINDINGS: There is no evidence of fracture or dislocation. No erosion, periosteal reaction, or bone destruction. Diffuse soft tissue edema of the wrist and forearm. No soft tissue air or radiopaque foreign body. IMPRESSION: Diffuse soft tissue edema without acute osseous abnormality. No radiopaque foreign body or soft tissue air. Electronically Signed   By: Keith Rake M.D.   On: 08/27/2020 01:58   DG Hand Complete Left  Result Date: 08/27/2020 CLINICAL DATA:  IV drug use with swelling and open wound. EXAM: LEFT HAND - COMPLETE 3+ VIEW COMPARISON:  Radiograph 07/21/2020. FINDINGS: There is no evidence of fracture or dislocation. No erosion, periosteal reaction, or bone destruction. Marked soft tissue edema of the hand and wrist. No radiopaque foreign body or soft tissue air. IMPRESSION:  Marked soft tissue edema of the hand and wrist. No osseous abnormality, radiopaque foreign body or soft tissue air. Electronically Signed   By: Keith Rake M.D.   On: 08/27/2020 01:57   [place] EKG SITE RITE  Result Date: 08/27/2020 If Site Rite image not attached, placement could not be confirmed due to current cardiac rhythm.   Assessment/Plan: Patient presents with soft tissue infection of the left hand and forearm.  No obvious abscess at the moment but due to the severity of the changes in her systemic symptoms will take to the operating room today to debride that area and identify any deeper undrained pockets.  She does have a CT scan which is pending which will likely help guide that.  In the meantime agree with antibiotics and blood sugar control as best as possible.  Case is posted for this afternoon to allow some resuscitation and further work-up to take place.  Cindra Presume 08/27/2020, 8:12 AM

## 2020-08-27 NOTE — Progress Notes (Signed)
Remains in PACU, per flowsheet 2 PIV's remain in place.  Will f/u re PICC placement Monday 08/28/20.

## 2020-08-27 NOTE — Anesthesia Procedure Notes (Signed)
Procedure Name: Intubation Date/Time: 08/27/2020 1:50 PM Performed by: Trinna Post., CRNA Pre-anesthesia Checklist: Patient identified, Emergency Drugs available, Suction available, Patient being monitored and Timeout performed Patient Re-evaluated:Patient Re-evaluated prior to induction Oxygen Delivery Method: Circle system utilized Preoxygenation: Pre-oxygenation with 100% oxygen Induction Type: IV induction, Rapid sequence and Cricoid Pressure applied Laryngoscope Size: Mac and 3 Grade View: Grade I Tube type: Oral Tube size: 7.0 mm Number of attempts: 1 Airway Equipment and Method: Stylet Placement Confirmation: ETT inserted through vocal cords under direct vision, positive ETCO2 and breath sounds checked- equal and bilateral Secured at: 22 cm Tube secured with: Tape Dental Injury: Teeth and Oropharynx as per pre-operative assessment

## 2020-08-27 NOTE — Transfer of Care (Signed)
Immediate Anesthesia Transfer of Care Note  Patient: [name]  Procedure(s) Performed: IRRIGATION AND DEBRIDEMENT HAND (Left)  Patient Location: PACU  Anesthesia Type:General  Level of Consciousness: drowsy and responds to stimulation  Airway & Oxygen Therapy: Patient Spontanous Breathing and Patient connected to nasal cannula oxygen  Post-op Assessment: Report given to RN and Post -op Vital signs reviewed and stable  Post vital signs: Reviewed and stable  Last Vitals:  Vitals Value Taken Time  BP 193/129 08/27/20 1507  Temp    Pulse 102 08/27/20 1509  Resp 19 08/27/20 1509  SpO2 100 % 08/27/20 1509  Vitals shown include unvalidated device data.  Last Pain:  Vitals:   08/27/20 1250  TempSrc: Oral  PainSc:          Complications: No notable events documented.

## 2020-08-27 NOTE — Sepsis Progress Note (Signed)
Following per sepsis protocol   

## 2020-08-27 NOTE — ED Notes (Signed)
Delay in fluid resuscitation due to gaining IV access

## 2020-08-27 NOTE — Progress Notes (Signed)
Able to  locate one piv with u/s.  RN notified unable to find 2nd piv site.

## 2020-08-27 NOTE — Anesthesia Preprocedure Evaluation (Addendum)
Anesthesia Evaluation  Patient identified by MRN, date of birth, ID band Patient awake    Reviewed: Allergy & Precautions, NPO status , Patient's Chart, lab work & pertinent test results  History of Anesthesia Complications Negative for: history of anesthetic complications  Airway Mallampati: I  TM Distance: >3 FB Neck ROM: Full    Dental  (+) Missing, Dental Advisory Given   Pulmonary Current Smoker and Patient abstained from smoking.,  08/27/2020 SARS coronavirus NEG   breath sounds clear to auscultation       Cardiovascular  Rhythm:Regular Rate:Tachycardia  07/11/2020 ECHO: EF 70-75%, hyperdynamic LVF with no regional abnormalities, no significant valvular abnormalities   Neuro/Psych Anxiety Depression negative neurological ROS     GI/Hepatic GERD  Controlled,(+)     substance abuse (several times/day, IV, snort)  IV drug use, Hepatitis -, C  Endo/Other  diabetes (glu 160), Insulin Dependent  Renal/GU Renal InsufficiencyRenal disease     Musculoskeletal  (+) narcotic dependent  Abdominal   Peds  Hematology negative hematology ROS (+)   Anesthesia Other Findings   Reproductive/Obstetrics Depo                             Anesthesia Physical Anesthesia Plan  ASA: 3  Anesthesia Plan: General   Post-op Pain Management:    Induction: Intravenous  PONV Risk Score and Plan: 2 and Ondansetron and Dexamethasone  Airway Management Planned: Oral ETT  Additional Equipment: None  Intra-op Plan:   Post-operative Plan: Extubation in OR  Informed Consent: I have reviewed the patients History and Physical, chart, labs and discussed the procedure including the risks, benefits and alternatives for the proposed anesthesia with the patient or authorized representative who has indicated his/her understanding and acceptance.     Dental advisory given  Plan Discussed with: CRNA and  Surgeon  Anesthesia Plan Comments:        Anesthesia Quick Evaluation

## 2020-08-27 NOTE — Progress Notes (Signed)
Pharmacy Antibiotic Note  Anna Dennis is a 30 y.o. female admitted on 08/27/2020 with cellulitis.  Pharmacy has been consulted for Zosyn dosing. Creat Clearance 84 ml/min  Plan: Zosyn 3.375g IV q8h (4 hour infusion). Monitor renal function and clcinical status  Height: 5\' 7"  (170.2 cm) Weight: 68 kg (150 lb) IBW/kg (Calculated) : 61.6  Temp (24hrs), Avg:101.4 F (38.6 C), Min:100 F (37.8 C), Max:102.5 F (39.2 C)  Recent Labs  Lab 08/27/20 0130 08/27/20 0131  WBC 21.1*  --   CREATININE 0.94  --   LATICACIDVEN  --  4.0*    Estimated Creatinine Clearance: 84.3 mL/min (by C-G formula based on SCr of 0.94 mg/dL).    No Known Allergies  Antimicrobials this admission: Zosyn 7/31 >>  Rocephin x 1 7/31 Clinda 7/31 >>   Thank you for allowing pharmacy to be a part of this patient's care.  8/31, PharmD, [facility] Clinical Pharmacist Please see AMION for all Pharmacists' Contact Phone Numbers 08/27/2020, 9:14 AM

## 2020-08-27 NOTE — Progress Notes (Signed)
Spoke with Ricard Dillon re PICC order.  States pt has 2 PIV's, working well.  States pt is scheduled to leave for OR soon and does not need the PICC prior to surgery since pt has adequate access.

## 2020-08-27 NOTE — Brief Op Note (Signed)
08/27/2020  2:47 PM  PATIENT:  Anna Dennis  30 y.o. female  PRE-OPERATIVE DIAGNOSIS:  Cellulitis left Hand  POST-OPERATIVE DIAGNOSIS:  Cellulitis left Hand  PROCEDURE:  Procedure(s): IRRIGATION AND DEBRIDEMENT HAND (Left)  SURGEON:  Surgeon(s) and Role:    * Edina Winningham, Wendy Poet, MD - Primary  PHYSICIAN ASSISTANT:   ASSISTANTS: none   ANESTHESIA:   general  EBL:  25 mL   BLOOD ADMINISTERED:none  DRAINS: none   LOCAL MEDICATIONS USED:  MARCAINE     SPECIMEN:  Source of Specimen:  wound and tissue culture  DISPOSITION OF SPECIMEN:  PATHOLOGY  COUNTS:  YES  TOURNIQUET:  * No tourniquets in log *  DICTATION: .Dragon Dictation  PLAN OF CARE: Admit to inpatient   PATIENT DISPOSITION:  PACU - hemodynamically stable.   Delay start of Pharmacological VTE agent (>24hrs) due to surgical blood loss or risk of bleeding: not applicable

## 2020-08-27 NOTE — Anesthesia Postprocedure Evaluation (Signed)
Anesthesia Post Note  Patient: Anna Dennis  Procedure(s) Performed: IRRIGATION AND DEBRIDEMENT HAND (Left)     Patient location during evaluation: PACU Anesthesia Type: General Level of consciousness: awake and alert, patient cooperative and oriented Pain management: pain level controlled Vital Signs Assessment: post-procedure vital signs reviewed and stable Respiratory status: spontaneous breathing, nonlabored ventilation and respiratory function stable Cardiovascular status: blood pressure returned to baseline and stable Postop Assessment: no apparent nausea or vomiting Anesthetic complications: no   No notable events documented.  Last Vitals:  Vitals:   08/27/20 1634 08/27/20 1649  BP: (!) 163/99 (!) 167/98  Pulse: 99 98  Resp: 13 15  Temp:  36.8 C  SpO2: 100% 100%    Last Pain:  Vitals:   08/27/20 1649  TempSrc:   PainSc: 3                  Durward Matranga,E. Phebe Dettmer

## 2020-08-27 NOTE — Progress Notes (Signed)
Spoke with Gloriajean Dell RN re PICC order.  States both PIV's are working well.  Aware PICC will not be placed today.

## 2020-08-28 ENCOUNTER — Inpatient Hospital Stay (HOSPITAL_COMMUNITY): Payer: Self-pay

## 2020-08-28 ENCOUNTER — Telehealth: Payer: Self-pay

## 2020-08-28 ENCOUNTER — Encounter (HOSPITAL_COMMUNITY): Payer: Self-pay | Admitting: Plastic Surgery

## 2020-08-28 DIAGNOSIS — I38 Endocarditis, valve unspecified: Secondary | ICD-10-CM

## 2020-08-28 DIAGNOSIS — R000 Tachycardia, unspecified: Secondary | ICD-10-CM

## 2020-08-28 LAB — ECHOCARDIOGRAM COMPLETE
Area-P 1/2: 7.66 cm2
Height: 67 in
S' Lateral: 2.7 cm
Weight: 2409.19 oz

## 2020-08-28 LAB — BASIC METABOLIC PANEL
Anion gap: 12 (ref 5–15)
BUN: 7 mg/dL (ref 6–20)
CO2: 23 mmol/L (ref 22–32)
Calcium: 8.5 mg/dL — ABNORMAL LOW (ref 8.9–10.3)
Chloride: 95 mmol/L — ABNORMAL LOW (ref 98–111)
Creatinine, Ser: 0.79 mg/dL (ref 0.44–1.00)
GFR, Estimated: >60 mL/min (ref >60)
Glucose, Bld: 314 mg/dL — ABNORMAL HIGH (ref 70–99)
Potassium: 4 mmol/L (ref 3.5–5.1)
Sodium: 130 mmol/L — ABNORMAL LOW (ref 135–145)

## 2020-08-28 LAB — CBC
HCT: 31.7 % — ABNORMAL LOW (ref 36.0–46.0)
Hemoglobin: 10.4 g/dL — ABNORMAL LOW (ref 12.0–15.0)
MCH: 27.2 pg (ref 26.0–34.0)
MCHC: 32.8 g/dL (ref 30.0–36.0)
MCV: 82.8 fL (ref 80.0–100.0)
Platelets: 310 10*3/uL (ref 150–400)
RBC: 3.83 MIL/uL — ABNORMAL LOW (ref 3.87–5.11)
RDW: 14.4 % (ref 11.5–15.5)
WBC: 19.9 10*3/uL — ABNORMAL HIGH (ref 4.0–10.5)
nRBC: 0 % (ref 0.0–0.2)

## 2020-08-28 LAB — GLUCOSE, CAPILLARY
Glucose-Capillary: 318 mg/dL — ABNORMAL HIGH (ref 70–99)
Glucose-Capillary: 254 mg/dL — ABNORMAL HIGH (ref 70–99)
Glucose-Capillary: 395 mg/dL — ABNORMAL HIGH (ref 70–99)
Glucose-Capillary: 219 mg/dL — ABNORMAL HIGH (ref 70–99)
Glucose-Capillary: 252 mg/dL — ABNORMAL HIGH (ref 70–99)
Glucose-Capillary: 214 mg/dL — ABNORMAL HIGH (ref 70–99)
Glucose-Capillary: 270 mg/dL — ABNORMAL HIGH (ref 70–99)

## 2020-08-28 LAB — LACTIC ACID, PLASMA: Lactic Acid, Venous: 2.1 mmol/L (ref 0.5–1.9)

## 2020-08-28 MED ORDER — INSULIN ASPART 100 UNIT/ML IJ SOLN
0.0000 [IU] | Freq: Every day | INTRAMUSCULAR | Status: DC
  Administered 2020-08-28: 3 [IU] via SUBCUTANEOUS

## 2020-08-28 MED ORDER — INSULIN GLARGINE-YFGN 100 UNIT/ML ~~LOC~~ SOLN
20.0000 [IU] | Freq: Every day | SUBCUTANEOUS | Status: DC
  Administered 2020-08-28: 20 [IU] via SUBCUTANEOUS
  Filled 2020-08-28 (×2): qty 0.2

## 2020-08-28 MED ORDER — INSULIN ASPART 100 UNIT/ML IJ SOLN
5.0000 [IU] | Freq: Once | INTRAMUSCULAR | Status: AC
  Administered 2020-08-28: 5 [IU] via SUBCUTANEOUS

## 2020-08-28 MED ORDER — INSULIN ASPART 100 UNIT/ML IJ SOLN: 3.0000 [IU] | Freq: Three times a day (TID) | INTRAMUSCULAR | Status: DC

## 2020-08-28 MED ORDER — CLONIDINE HCL 0.2 MG PO TABS
0.2000 mg | ORAL_TABLET | Freq: Three times a day (TID) | ORAL | Status: DC
  Administered 2020-08-28 – 2020-08-29 (×4): 0.2 mg via ORAL
  Filled 2020-08-28 (×4): qty 1

## 2020-08-28 MED ORDER — INSULIN ASPART 100 UNIT/ML IJ SOLN
0.0000 [IU] | Freq: Three times a day (TID) | INTRAMUSCULAR | Status: DC
  Administered 2020-08-28: 20 [IU] via SUBCUTANEOUS
  Administered 2020-08-28: 11 [IU] via SUBCUTANEOUS
  Administered 2020-08-29: 20 [IU] via SUBCUTANEOUS
  Administered 2020-08-29 (×2): 7 [IU] via SUBCUTANEOUS
  Administered 2020-08-30: 11 [IU] via SUBCUTANEOUS
  Administered 2020-08-30: 7 [IU] via SUBCUTANEOUS
  Administered 2020-08-31: 4 [IU] via SUBCUTANEOUS
  Administered 2020-08-31: 3 [IU] via SUBCUTANEOUS

## 2020-08-28 NOTE — Progress Notes (Signed)
  Echocardiogram 2D Echocardiogram has been performed.  Pieter Partridge 08/28/2020, 2:20 PM

## 2020-08-28 NOTE — Progress Notes (Signed)
PROGRESS NOTE  Anna Dennis  ZOX:[id number] DOB: 09/07/1990 DOA: 08/27/2020 PCP: Dartha LodgeSteele, Anthony, FNP   Brief Narrative: Anna Rumpsrielle Mccorkel is a 30 y.o. 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 Found to be septic, hyperglycemic. Blood cultures drawn, IV antibiotics and IVF given. Taken to OR for debridement 7/31.   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 Arita MissPace 08/27/2020.  - Continue vancomycin, zosyn, clindamycin until cultures result, narrow accordingly.   - Monitor blood cultures, and anticipated intraoperative culture. - Wound care per plastic surgery.   T1DM with hyperglycemia: HbA1c 11.6% last month. Ran out of long-acting insulin PTA. - Increase basal insulin to 20u, increase SSI to resistant and add novolog 3u TIDWC.  HTN urgency:  - Increase clonidine   Heroin use: No evidence of withdrawal at this time. - Monitor for withdrawal.  - Pain control as ordered, requiring higher doses due to tolerance. Will deescalate to po Tx only over next 24 hours.   DVT prophylaxis: Lovenox Code Status: Full Family Communication: None at bedside Disposition Plan:  Status is: Inpatient  Remains inpatient appropriate because:Inpatient level of care appropriate due to severity of illness  Dispo: The patient is from: Home              Anticipated d/c is to: Home              Patient currently is not medically stable to d/c.   Difficult to place patient No  Consultants:  Plastic  surgery  Procedures:  08/27/2020, Dr. Merry Proudollier Pace:  1.  Debridement left wrist wound totaling 4 x 4 cm including skin and subcutaneous tissue 2.  Irrigation and debridement left volar wrist abscess 3.  Open left carpal tunnel release  Antimicrobials: Vancomycin, zosyn, clindamycin   Subjective: Pain is controlled currently. Eating ok. No fevers or other complaints. No chest pain or dyspnea currently.   Objective: Vitals:   08/28/20 0413 08/28/20 0624 08/28/20 0740 08/28/20 0840  BP: (!) 195/99 (!) 159/95 (!) 144/77   Pulse: (!) 103 99 96 98  Resp: 17 16 (!) 22 20  Temp: 98 F (36.7 C)  98 F (36.7 C)   TempSrc: Oral  Oral   SpO2: 100%  99% 99%  Weight:      Height:        Intake/Output Summary (Last 24 hours) at 08/28/2020 0928 Last data filed at 08/28/2020 [id number] Gross per 24 hour  Intake 2927.46 ml  Output 8125 ml  Net -5197.54 ml   Filed Weights   08/27/20 0121 08/27/20 1731  Weight: 68 kg 68.3 kg    Gen: 30 y.o. female in no distress Pulm: Non-labored breathing room air. Clear to auscultation bilaterally.  CV: Regular tachycardia. No murmur, rub, or gallop. No JVD, no pedal edema. GI: Abdomen soft, non-tender, non-distended, with normoactive bowel sounds. No organomegaly or masses felt. Ext: Able to move fingers on right hand with intact sensation, brisk cap refill.  Skin: Left wrist and upper arm with dressings c/d/I. No other  rashes, lesions or ulcers Neuro: Drowsy but rousable, interactive, following commands, oriented. No focal neurological deficits. Psych: Judgement and insight appear normal. Mood & affect appropriate.   Data Reviewed: I have personally reviewed following labs and imaging studies  CBC: Recent Labs  Lab 08/27/20 0130 08/27/20 2055 08/28/20 0138  WBC 21.1* 21.2* 19.9*  NEUTROABS 17.7* 18.2*  --   HGB 11.3* 10.2* 10.4*  HCT 35.0* 32.6* 31.7*  MCV 83.9 85.3 82.8  PLT 344 323 310   Basic Metabolic Panel: Recent Labs  Lab 08/27/20 0130  08/28/20 0138  NA 129* 130*  K 3.7 4.0  CL 93* 95*  CO2 23 23  GLUCOSE 291* 314*  BUN 12 7  CREATININE 0.94 0.79  CALCIUM 9.4 8.5*   GFR: Estimated Creatinine Clearance: 99.1 mL/min (by C-G formula based on SCr of 0.79 mg/dL). Liver Function Tests: Recent Labs  Lab 08/27/20 0130  AST 18  ALT 13  ALKPHOS 133*  BILITOT 0.2*  PROT 8.6*  ALBUMIN 3.7   No results for input(s): LIPASE, AMYLASE in the last 168 hours. No results for input(s): AMMONIA in the last 168 hours. Coagulation Profile: No results for input(s): INR, PROTIME in the last 168 hours. Cardiac Enzymes: No results for input(s): CKTOTAL, CKMB, CKMBINDEX, TROPONINI in the last 168 hours. BNP (last 3 results) No results for input(s): PROBNP in the last 8760 hours. HbA1C: No results for input(s): HGBA1C in the last 72 hours. CBG: Recent Labs  Lab 08/27/20 1743 08/27/20 2002 08/27/20 2231 08/28/20 0424 08/28/20 0739  GLUCAP 344* 356* 398* 318* 254*   Lipid Profile: No results for input(s): CHOL, HDL, LDLCALC, TRIG, CHOLHDL, LDLDIRECT in the last 72 hours. Thyroid Function Tests: No results for input(s): TSH, T4TOTAL, FREET4, T3FREE, THYROIDAB in the last 72 hours. Anemia Panel: No results for input(s): VITAMINB12, FOLATE, FERRITIN, TIBC, IRON, RETICCTPCT in the last 72 hours. Urine analysis:    Component Value Date/Time   COLORURINE COLORLESS (A) 07/25/2016 0730   APPEARANCEUR CLEAR 07/25/2016 0730   LABSPEC 1.025 07/25/2016 0730   PHURINE 6.0 07/25/2016 0730   GLUCOSEU >=500 (A) 07/25/2016 0730   HGBUR NEGATIVE 07/25/2016 0730   BILIRUBINUR NEGATIVE 07/25/2016 0730   KETONESUR 80 (A) 07/25/2016 0730   PROTEINUR NEGATIVE 07/25/2016 0730   UROBILINOGEN 1.0 03/16/2015 1713   NITRITE NEGATIVE 07/25/2016 0730   LEUKOCYTESUR NEGATIVE 07/25/2016 0730   Recent Results (from the past 240 hour(s))  SARS CORONAVIRUS 2 (TAT 6-24 HRS) Nasopharyngeal Nasopharyngeal Swab     Status: None   Collection Time:  08/27/20  6:53 AM   Specimen: Nasopharyngeal Swab  Result Value Ref Range Status   SARS Coronavirus 2 NEGATIVE NEGATIVE Final    Comment: (NOTE) SARS-CoV-2 target nucleic acids are NOT DETECTED.  The SARS-CoV-2 RNA is generally detectable in upper and lower respiratory specimens during the acute phase of infection. Negative results do not preclude SARS-CoV-2 infection, do not rule out co-infections with other pathogens, and should not be used as the sole basis for treatment or other patient management decisions. Negative results must be combined with clinical observations, patient history, and epidemiological information. The expected result is Negative.  Fact Sheet for Patients: HairSlick.no  Fact Sheet for Healthcare Providers: quierodirigir.com  This test is not yet approved or cleared by the [place] FDA and  has been authorized for detection and/or diagnosis of SARS-CoV-2 by FDA under an Emergency Use Authorization (EUA). This EUA will remain  in effect (meaning this test can be used) for  the duration of the COVID-19 declaration under Se ction 564(b)(1) of the Act, 21 U.S.C. section 360bbb-3(b)(1), unless the authorization is terminated or revoked sooner.  Performed at [facility], [address] [place], [place] [address]   Anaerobic culture w Gram Stain     Status: None (Preliminary result)   Collection Time: 08/27/20  2:05 PM   Specimen: PATH Other; Tissue  Result Value Ref Range Status   Specimen Description TISSUE  Final   Special Requests LEFT WRIST ABCESS SPEC A  Final   Gram Stain   Final    RARE WBC PRESENT,BOTH PMN AND MONONUCLEAR ABUNDANT GRAM POSITIVE COCCI IN PAIRS MODERATE GRAM NEGATIVE COCCOBACILLI Performed at [facility], [address], [place], [place] [address]    Culture PENDING  Incomplete   Report Status PENDING  Incomplete  Aerobic/Anaerobic Culture w Gram Stain  (surgical/deep wound)     Status: None (Preliminary result)   Collection Time: 08/27/20  2:08 PM   Specimen: PATH Other; Body Fluid  Result Value Ref Range Status   Specimen Description FLUID  Final   Special Requests LEFT WRIST ABSCESS FLUID SPEC B  Final   Gram Stain   Final    FEW WBC PRESENT,BOTH PMN AND MONONUCLEAR MODERATE GRAM NEGATIVE RODS MODERATE GRAM POSITIVE COCCI IN PAIRS IN CLUSTERS Performed at [facility], [address] [place], [place] [address]    Culture PENDING  Incomplete   Report Status PENDING  Incomplete      Radiology Studies: DG Wrist Complete Left  Result Date: 08/27/2020 CLINICAL DATA:  IV drug use.  Swelling, open wound. EXAM: LEFT WRIST - COMPLETE 3+ VIEW COMPARISON:  None. FINDINGS: There is no evidence of fracture or dislocation. No erosion, periosteal reaction, or bone destruction. Diffuse soft tissue edema of the wrist and forearm. No soft tissue air or radiopaque foreign body. IMPRESSION: Diffuse soft tissue edema without acute osseous abnormality. No radiopaque foreign body or soft tissue air. Electronically Signed   By: Narda Rutherford M.D.   On: 08/27/2020 01:58   DG Hand Complete Left  Result Date: 08/27/2020 CLINICAL DATA:  IV drug use with swelling and open wound. EXAM: LEFT HAND - COMPLETE 3+ VIEW COMPARISON:  Radiograph 07/21/2020. FINDINGS: There is no evidence of fracture or dislocation. No erosion, periosteal reaction, or bone destruction. Marked soft tissue edema of the hand and wrist. No radiopaque foreign body or soft tissue air. IMPRESSION: Marked soft tissue edema of the hand and wrist. No osseous abnormality, radiopaque foreign body or soft tissue air. Electronically Signed   By: Narda Rutherford M.D.   On: 08/27/2020 01:57   [place] EKG SITE RITE  Result Date: 08/27/2020 If Site Rite image not attached, placement could not be confirmed due to current cardiac rhythm.  CT Extrem Up Entire Arm L W/CM  Result Date:  08/27/2020 CLINICAL DATA:  Wound on the left wrist pain and swelling. History of IV drug abuse. EXAM: CT OF THE UPPER LEFT EXTREMITY WITH CONTRAST TECHNIQUE: Multidetector CT imaging of the upper left extremity was performed according to the standard protocol following intravenous contrast administration. CONTRAST:  100 mL Omnipaque 350. COMPARISON:  Plain films left wrist and hand today. FINDINGS: Bones/Joint/Cartilage No bony destructive change or periosteal reaction is identified. No fracture or dislocation. No joint effusion. Ligaments Suboptimally assessed by CT. Muscles and Tendons No gas is seen within muscle or tracking along fascial planes. No intramuscular abscess is identified. There is fluid between  muscles in the flexor compartments of the forearm. Soft tissues Diffuse subcutaneous edema in the arm is worst in the forearm and about the wrist and hand. No abscess is identified. IMPRESSION: Diffuse subcutaneous edema in the left arm is worst in the forearm and hand in consistent with cellulitis. Edema and fluid tracking along fascia in the flexor compartment of the forearm is compatible with fasciitis. There is no gas within fascia to suggest necrotizing fasciitis. Negative for abscess or evidence of osteomyelitis. Electronically Signed   By: Drusilla Kanner M.D.   On: 08/27/2020 11:04    Scheduled Meds:  Chlorhexidine Gluconate Cloth  6 each Topical Daily   cloNIDine  0.2 mg Oral Q8H   enoxaparin (LOVENOX) injection  40 mg Subcutaneous Q24H   insulin aspart  0-20 Units Subcutaneous TID WC   insulin aspart  0-5 Units Subcutaneous QHS   insulin aspart  3 Units Subcutaneous TID WC   insulin glargine-yfgn  20 Units Subcutaneous Daily   nicotine  7 mg Transdermal Daily   Continuous Infusions:  clindamycin (CLEOCIN) IV 600 mg (08/28/20 0517)   lactated ringers 100 mL/hr at 08/27/20 2242   piperacillin-tazobactam (ZOSYN)  IV 3.375 g (08/28/20 1443)   vancomycin 1,000 mg (08/27/20 2209)      LOS: 1 day   Time spent: 35 minutes.  Tyrone Nine, MD [facility] [email or web address] 08/28/2020, 9:28 AM

## 2020-08-28 NOTE — Telephone Encounter (Signed)
Nadine called from [place] [facility] [place] to request order for dressing change for patient.  Please call.

## 2020-08-28 NOTE — Progress Notes (Signed)
Inpatient Diabetes Program Recommendations  AACE/ADA: New Consensus Statement on Inpatient Glycemic Control (2015)  Target Ranges:  Prepandial:   less than 140 mg/dL      Peak postprandial:   less than 180 mg/dL (1-2 hours)      Critically ill patients:  140 - 180 mg/dL   Lab Results  Component Value Date   GLUCAP 219 (H) 08/28/2020   HGBA1C 11.6 (H) 07/11/2020    Review of Glycemic Control Results for [facility], GLASBY (MRN [id number]) as of 08/28/2020 15:51  Ref. Range 08/27/2020 22:31 08/28/2020 04:24 08/28/2020 07:39 08/28/2020 11:47 08/28/2020 15:42  Glucose-Capillary Latest Ref Range: 70 - 99 mg/dL 657 (H) 846 (H) 962 (H) 395 (H) 219 (H)   Diabetes history: DM 91 since 29 years old Outpatient Diabetes medications: Per patient she was supposed to be taking Lantus 50 units daily and Humalog 1 unit/5 grams CHO plus correction  She was only taking Novolin R prior to admit b/c she needs to go to health dept. To pick up Lantus/Humalog Patient Current orders for Inpatient glycemic control:  Novolog resistant tid with meals and HS Novolog 3 units tid with meals Glargine-yfgn (Semglee) 20 units daily  Inpatient Diabetes Program Recommendations:    Spoke to patient at bedside.  Patient states that she feels like her blood sugar is high. Meal tray is at bedside and she only drank a carton of milk which she states she needs 3 units of coverage for (1 unit/5 grams CHO) and she requests an additional 2 units for elevated blood sugar.  Sent request to MD.  Orders given.   Patient states that he mother bought her Novolin R until she could get to the health dept. To get her Lantus/Humalog.  She seems to be knowledgeable regarding CHO counting/correction however admits she has not been taking medications as ordered.  She see's Dr. Dartha Lodge.  Discussed to wait to order dinner tray so that it will arrive at 6:30p.  Discussed plan with RN.  Will follow.   Thanks,  Beryl Meager, RN, BC-ADM Inpatient  Diabetes Coordinator Pager [phone]  (8a-5p)

## 2020-08-28 NOTE — Progress Notes (Signed)
Patient is 1 day postop from incision drainage left wrist abscess.  She seems to be improving.  Subjectively she feels better.  Vital signs are improved as well.  She reports good sensation in her fingers and improved range of motion from the day before.  Proximal forearm is much less swollen and tight than it previously was.  Fingers are well-perfused as well.  Would recommend continuing to keep the hand elevated and continuing with twice a day dressing changes as previously described.  No plans to return to the operating room at this point.  Call with any questions.  Agree with improved blood sugar control.

## 2020-08-29 LAB — HEPATITIS C ANTIBODY: HCV Ab: REACTIVE — AB

## 2020-08-29 LAB — BASIC METABOLIC PANEL
Anion gap: 22 — ABNORMAL HIGH (ref 5–15)
BUN: 18 mg/dL (ref 6–20)
CO2: 7 mmol/L — ABNORMAL LOW (ref 22–32)
Calcium: 8.6 mg/dL — ABNORMAL LOW (ref 8.9–10.3)
Chloride: 98 mmol/L (ref 98–111)
Creatinine, Ser: 1.26 mg/dL — ABNORMAL HIGH (ref 0.44–1.00)
GFR, Estimated: 59 mL/min — ABNORMAL LOW (ref >60)
Glucose, Bld: 489 mg/dL — ABNORMAL HIGH (ref 70–99)
Potassium: 4.6 mmol/L (ref 3.5–5.1)
Sodium: 127 mmol/L — ABNORMAL LOW (ref 135–145)

## 2020-08-29 LAB — CBC WITH DIFFERENTIAL/PLATELET
Abs Immature Granulocytes: 0.17 10*3/uL — ABNORMAL HIGH (ref 0.00–0.07)
Basophils Absolute: 0 10*3/uL (ref 0.0–0.1)
Basophils Relative: 0 %
Eosinophils Absolute: 0 10*3/uL (ref 0.0–0.5)
Eosinophils Relative: 0 %
HCT: 37.5 % (ref 36.0–46.0)
Hemoglobin: 11.8 g/dL — ABNORMAL LOW (ref 12.0–15.0)
Immature Granulocytes: 1 %
Lymphocytes Relative: 8 %
Lymphs Abs: 1.3 10*3/uL (ref 0.7–4.0)
MCH: 26.5 pg (ref 26.0–34.0)
MCHC: 31.5 g/dL (ref 30.0–36.0)
MCV: 84.3 fL (ref 80.0–100.0)
Monocytes Absolute: 0.3 10*3/uL (ref 0.1–1.0)
Monocytes Relative: 2 %
Neutro Abs: 15.4 10*3/uL — ABNORMAL HIGH (ref 1.7–7.7)
Neutrophils Relative %: 89 %
Platelets: 536 10*3/uL — ABNORMAL HIGH (ref 150–400)
RBC: 4.45 MIL/uL (ref 3.87–5.11)
RDW: 14.6 % (ref 11.5–15.5)
WBC: 17.3 10*3/uL — ABNORMAL HIGH (ref 4.0–10.5)
nRBC: 0 % (ref 0.0–0.2)

## 2020-08-29 LAB — GLUCOSE, CAPILLARY
Glucose-Capillary: 472 mg/dL — ABNORMAL HIGH (ref 70–99)
Glucose-Capillary: 403 mg/dL — ABNORMAL HIGH (ref 70–99)
Glucose-Capillary: 246 mg/dL — ABNORMAL HIGH (ref 70–99)
Glucose-Capillary: 145 mg/dL — ABNORMAL HIGH (ref 70–99)
Glucose-Capillary: 236 mg/dL — ABNORMAL HIGH (ref 70–99)
Glucose-Capillary: 287 mg/dL — ABNORMAL HIGH (ref 70–99)
Glucose-Capillary: 154 mg/dL — ABNORMAL HIGH (ref 70–99)

## 2020-08-29 MED ORDER — INSULIN ASPART 100 UNIT/ML IJ SOLN: 6.0000 [IU] | Freq: Three times a day (TID) | INTRAMUSCULAR | Status: DC

## 2020-08-29 MED ORDER — INSULIN GLARGINE-YFGN 100 UNIT/ML ~~LOC~~ SOLN
35.0000 [IU] | Freq: Every day | SUBCUTANEOUS | Status: DC
  Administered 2020-08-29: 35 [IU] via SUBCUTANEOUS
  Filled 2020-08-29 (×2): qty 0.35

## 2020-08-29 MED ORDER — INSULIN ASPART 100 UNIT/ML IJ SOLN
8.0000 [IU] | Freq: Three times a day (TID) | INTRAMUSCULAR | Status: DC
  Administered 2020-08-29 – 2020-08-30 (×2): 8 [IU] via SUBCUTANEOUS

## 2020-08-29 MED ORDER — OXYCODONE HCL 5 MG PO TABS
10.0000 mg | ORAL_TABLET | ORAL | Status: DC | PRN
  Administered 2020-08-29 (×2): 15 mg via ORAL
  Filled 2020-08-29 (×2): qty 3

## 2020-08-29 MED ORDER — SODIUM CHLORIDE 0.9 % IV BOLUS
1000.0000 mL | Freq: Once | INTRAVENOUS | Status: AC
  Administered 2020-08-29: 1000 mL via INTRAVENOUS

## 2020-08-29 MED ORDER — LOSARTAN POTASSIUM 25 MG PO TABS
25.0000 mg | ORAL_TABLET | Freq: Every day | ORAL | Status: DC
  Administered 2020-08-29: 25 mg via ORAL
  Filled 2020-08-29: qty 1

## 2020-08-29 MED ORDER — INSULIN ASPART 100 UNIT/ML IJ SOLN
15.0000 [IU] | Freq: Once | INTRAMUSCULAR | Status: AC
  Administered 2020-08-29: 15 [IU] via SUBCUTANEOUS

## 2020-08-29 NOTE — Plan of Care (Signed)

## 2020-08-29 NOTE — Consult Note (Signed)
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  Or notes reviewed. OR cultures growing Group C streptococci ( Gram stain showing GPC in pairs and GN coccobacilli)  Blood cultures 7/31 NG in 2 days   DM IVDU HCV ab reactive   Recommendations  Continue zosyn for now while OR cultures are pending. We may be able to deescalate zosyn once GN seen in gram stain is identified.  Continue clindamycin until tomorrow and can stop thereafter Will check for HCV RNA Following cultures   Rest of the management as per the primary team. Please call with questions or concerns.  Thank you for the consult  Odette Fraction, MD Infectious Disease Physician [facility] [address] [place], [place] [address] Phone: [phone]  Fax: [phone]  __________________________________________________________________________________________________________ HPI and Hospital Course: 30 year old female with PMH of IVDU, DM type I, history of left arm abscess s/p I and D in June  2022 ( left AMA before completion of evaluation) presented to the ED on 7/31 with left arm swelling redness and pain for 2 days.  She had significant pain along with pain with movement of her left arm.  She also developed a small blister on the ventral aspect of her left wrist that started draining.  She had fever and chills at home and was injecting heroin in her left wrist multiple times a day for the last few weeks.  She was also out of her Lantus for the past few weeks and has been using Novolin R at home that she got from [place]. She denies any issues in her left arm where she had I and D done in June 2022 and is currently wrapped in a bandage.   At ED, she was febrile with T-max 102.  WBC 21.1.  Lactic acid 4 Labs remarkable for NA 129 Seen by plastics and underwent debridement of left wrist, I&D of left volar wrist abscess with open left carpal tunnel release on 7/31.  Or notes reviewed. OR cultures growing Group C streptococci ( Gram stain showing GPC in  pairs and GN coccobacilli)   ROS: General- Denies fever, chills, loss of appetite and loss of weight HEENT - Denies headache, blurry vision, neck pain, sinus pain Chest - Denies any chest pain, SOB or cough CVS- Denies any dizziness/lightheadedness, syncopal attacks, palpitations Abdomen- Denies any nausea, vomiting, abdominal pain, hematochezia and diarrhea Neuro - Denies any weakness, numbness, tingling sensation Psych - Denies any changes in mood irritability or depressive symptoms GU- Denies any burning, dysuria, hematuria or increased frequency of urination Skin - denies any rashes/lesions MSK - Complains of pain in the left forearm and restricted ROM of left arm due to pain   Past Medical History:  Diagnosis Date   Depression    Diabetes mellitus    Insulin pump   GERD (gastroesophageal reflux disease)    Hepatitis C    Heroin abuse (HCC)    Iron deficiency anemia    IVDU (intravenous drug user)    OCD (obsessive compulsive  disorder)    Past Surgical History:  Procedure Laterality Date   I & D EXTREMITY Left 07/11/2020   Procedure: IRRIGATION AND DEBRIDEMENT EXTREMITY;  Surgeon: Terance Hart, MD;  Location: [facility] OR;  Service: Orthopedics;  Laterality: Left;   I & D EXTREMITY Left 08/27/2020   Procedure: IRRIGATION AND DEBRIDEMENT HAND;  Surgeon: Allena Napoleon, MD;  Location: [facility] OR;  Service: Plastics;  Laterality: Left;     Scheduled Meds:  Chlorhexidine Gluconate Cloth  6 each Topical Daily   cloNIDine  0.2 mg Oral Q8H   enoxaparin (LOVENOX) injection  40 mg Subcutaneous Q24H   insulin aspart  0-20 Units Subcutaneous TID WC   insulin aspart  0-5 Units Subcutaneous QHS   insulin aspart  8 Units Subcutaneous TID WC   insulin glargine-yfgn  35 Units Subcutaneous Daily   losartan  25 mg Oral Daily   nicotine  7 mg Transdermal Daily   Continuous Infusions:  clindamycin (CLEOCIN) IV 600 mg (08/29/20 0610)   lactated ringers Stopped (08/28/20 1249)   piperacillin-tazobactam (ZOSYN)  IV 3.375 g (08/29/20 0723)   vancomycin 1,000 mg (08/28/20 2304)   PRN Meds:.acetaminophen **OR** acetaminophen, HYDROmorphone (DILAUDID) injection, ondansetron **OR** ondansetron (ZOFRAN) IV, oxyCODONE, senna-docusate, traZODone  Social History   Socioeconomic History   Marital status: Single    Spouse name: Not on file   Number of children: Not on file   Years of education: Not on file   Highest education level: Not on file  Occupational History   Not on file  Tobacco Use   Smoking status: Every Day    Types: Cigarettes   Smokeless tobacco: Never  Substance and Sexual Activity   Alcohol use: Yes    Alcohol/week: 0.0 standard drinks    Comment: occasional   Drug use: Yes    Comment: patient states clean since 03/29/14   Sexual activity: Not on file  Other Topics Concern   Not on file  Social History Narrative   Not on file   Social Determinants of Health   Financial Resource Strain: Not on file  Food  Insecurity: Not on file  Transportation Needs: Not on file  Physical Activity: Not on file  Stress: Not on file  Social Connections: Not on file  Intimate Partner Violence: Not on file     Vitals BP 131/74 (BP Location: Right Arm)   Pulse 98   Temp 98.5 F (36.9 C) (Axillary)   Resp 20   Ht  (1.702 m)   Wt 68.3 kg  LMP 07/20/2020   SpO2 100%   BMI 23.58 kg/m    Physical Exam Constitutional: Average built lady, not in acute distress    Comments:   Cardiovascular:     Rate and Rhythm: Normal rate and regular rhythm.     Heart sounds: Systolic murmur  Pulmonary:     Effort: Pulmonary effort is normal.     Comments:   Abdominal:     Palpations: Abdomen is soft.     Tenderness: Nontender and nondistended  Musculoskeletal:        General: Left arm is wrapped in a bandage.  Left elbow is mildly swollen but no tenderness/or signs of septic joint.  Left arm is also wrapped in a bandage.  She is able to wiggle her left fingers.   Skin:    Comments: No lesions or rashes  Neurological:     General: No focal deficit present.   Psychiatric:        Mood and Affect: Mood normal.   Pertinent Microbiology Results for orders placed or performed during the hospital encounter of 08/27/20  Blood culture (routine x 2)     Status: None (Preliminary result)   Collection Time: 08/27/20  1:10 AM   Specimen: BLOOD RIGHT ARM  Result Value Ref Range Status   Specimen Description BLOOD RIGHT ARM  Final   Special Requests   Final    BOTTLES DRAWN AEROBIC AND ANAEROBIC Blood Culture adequate volume   Culture   Final    NO GROWTH 2 DAYS Performed at [facility], [address] [place], [place] [address]    Report Status PENDING  Incomplete  Blood culture (routine x 2)     Status: None (Preliminary result)   Collection Time: 08/27/20  1:27 AM   Specimen: BLOOD RIGHT HAND  Result Value Ref Range Status   Specimen Description BLOOD RIGHT HAND  Final   Special Requests    Final    BOTTLES DRAWN AEROBIC ONLY Blood Culture results may not be optimal due to an excessive volume of blood received in culture bottles   Culture   Final    NO GROWTH 2 DAYS Performed at [facility], [address] [place], [place] [address]    Report Status PENDING  Incomplete  SARS CORONAVIRUS 2 (TAT 6-24 HRS) Nasopharyngeal Nasopharyngeal Swab     Status: None   Collection Time: 08/27/20  6:53 AM   Specimen: Nasopharyngeal Swab  Result Value Ref Range Status   SARS Coronavirus 2 NEGATIVE NEGATIVE Final    Comment: (NOTE) SARS-CoV-2 target nucleic acids are NOT DETECTED.  The SARS-CoV-2 RNA is generally detectable in upper and lower respiratory specimens during the acute phase of infection. Negative results do not preclude SARS-CoV-2 infection, do not rule out co-infections with other pathogens, and should not be used as the sole basis for treatment or other patient management decisions. Negative results must be combined with clinical observations, patient history, and epidemiological information. The expected result is Negative.  Fact Sheet for Patients: HairSlick.no  Fact Sheet for Healthcare Providers: quierodirigir.com  This test is not yet approved or cleared by the [place] FDA and  has been authorized for detection and/or diagnosis of SARS-CoV-2 by FDA under an Emergency Use Authorization (EUA). This EUA will remain  in effect (meaning this test can be used) for the duration of the COVID-19 declaration under Se ction 564(b)(1) of the Act, 21 U.S.C. section 360bbb-3(b)(1), unless the authorization is terminated or revoked  sooner.  Performed at [facility], [address], [place], [place] [address]   Anaerobic culture w Gram Stain     Status: None (Preliminary result)   Collection Time: 08/27/20  2:05 PM   Specimen: PATH Other; Tissue  Result Value Ref Range Status   Specimen Description  TISSUE  Final   Special Requests LEFT WRIST ABCESS SPEC A  Final   Gram Stain   Final    RARE WBC PRESENT,BOTH PMN AND MONONUCLEAR ABUNDANT GRAM POSITIVE COCCI IN PAIRS MODERATE GRAM NEGATIVE COCCOBACILLI Performed at [facility], [address], [place], [place] [address]    Culture PENDING  Incomplete   Report Status PENDING  Incomplete  Aerobic/Anaerobic Culture w Gram Stain (surgical/deep wound)     Status: None (Preliminary result)   Collection Time: 08/27/20  2:08 PM   Specimen: PATH Other; Body Fluid  Result Value Ref Range Status   Specimen Description FLUID  Final   Special Requests LEFT WRIST ABSCESS FLUID SPEC B  Final   Gram Stain   Final    FEW WBC PRESENT,BOTH PMN AND MONONUCLEAR MODERATE GRAM NEGATIVE RODS MODERATE GRAM POSITIVE COCCI IN PAIRS IN CLUSTERS    Culture   Final    ABUNDANT STREPTOCOCCUS GROUP C Beta hemolytic streptococci are predictably susceptible to penicillin and other beta lactams. Susceptibility testing not routinely performed. Performed at [facility], [address] [place], [place] [address]    Report Status PENDING  Incomplete     Pertinent Lab seen by me: CBC Latest Ref Rng & Units 08/28/2020 08/27/2020 08/27/2020  WBC 4.0 - 10.5 K/uL 19.9(H) 21.2(H) 21.1(H)  Hemoglobin 12.0 - 15.0 g/dL 10.4(L) 10.2(L) 11.3(L)  Hematocrit 36.0 - 46.0 % 31.7(L) 32.6(L) 35.0(L)  Platelets 150 - 400 K/uL 310 323 344   CMP Latest Ref Rng & Units 08/28/2020 08/27/2020 07/14/2020  Glucose 70 - 99 mg/dL 102(V) 253(G) 644(I)  BUN 6 - 20 mg/dL 7 12 6   Creatinine 0.44 - 1.00 mg/dL 3.47 4.25  Sodium 135 - 145 mmol/L 130(L) 129(L) 136  Potassium 3.5 - 5.1 mmol/L 4.0 3.7 3.1(L)  Chloride 98 - 111 mmol/L 95(L) 93(L) 104  CO2 22 - 32 mmol/L 23 23 26   Calcium 8.9 - 10.3 mg/dL 9.56) 9.4 )  Total Protein 6.5 - 8.1 g/dL - 8.6(H) -  Total Bilirubin 0.3 - 1.2 mg/dL - 3.8(V) -  Alkaline Phos 38 - 126 U/L - 133(H) -  AST 15 - 41 U/L - 18 -  ALT 0 -  44 U/L - 13 -     Pertinent Imagings/Other Imagings Plain films and CT images have been personally visualized and interpreted; radiology reports have been reviewed. Decision making incorporated into the Impression / Recommendations.  CT left arm 08/27/20 FINDINGS: Bones/Joint/Cartilage   No bony destructive change or periosteal reaction is identified. No fracture or dislocation. No joint effusion.   Ligaments   Suboptimally assessed by CT.   Muscles and Tendons   No gas is seen within muscle or tracking along fascial planes. No intramuscular abscess is identified. There is fluid between muscles in the flexor compartments of the forearm.   Soft tissues   Diffuse subcutaneous edema in the arm is worst in the forearm and about the wrist and hand. No abscess is identified.   IMPRESSION: Diffuse subcutaneous edema in the left arm is worst in the forearm and hand in consistent with cellulitis. Edema and fluid tracking along fascia in the  flexor compartment of the forearm is compatible with fasciitis. There is no gas within fascia to suggest necrotizing fasciitis.   Negative for abscess or evidence of osteomyelitis.    Xray left hand/left wrist 08/27/20 IMPRESSION: Marked soft tissue edema of the hand and wrist. No osseous abnormality, radiopaque foreign body or soft tissue air.  TTE 08/29/20 1. Left ventricular ejection fraction, by estimation, is 65 to 70%. The left ventricle has normal function. The left ventricle has no regional wall motion abnormalities. Left ventricular diastolic parameters were normal. 2. Right ventricular systolic function is normal. The right ventricular size is normal. Tricuspid regurgitation signal is inadequate for assessing PA pressure. 3. The mitral valve is normal in structure. No evidence of mitral valve regurgitation. 4. The aortic valve is tricuspid. Aortic valve regurgitation is not visualized. No aortic stenosis is present. 5. The  inferior vena cava is normal in size with greater than 50% respiratory variability, suggesting right atrial pressure of 3 mmHg. Conclusion(s)/Recommendation(s): No vegetation seen. If high clinical suspicion for endocarditis, consider TEE.  I spent more than 70  minutes for this patient encounter including review of prior medical records/discussing diagnostics and treatment plan with the patient/family/coordinate care with primary/other specialits with greater than 50% of time in face to face encounter.   Electronically signed by:   Odette FractionSabina Davion Flannery, MD Infectious Disease Physician [facility]  [facility] Pager: [phone]

## 2020-08-29 NOTE — Progress Notes (Signed)
PROGRESS NOTE  Anna Dennis  OTL:[id number] DOB: 05/23/1990 DOA: 08/27/2020 PCP: Dartha Lodge, FNP   Brief Narrative: Anna Dennis is a 30 y.o. 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 Found to be septic, hyperglycemic. Blood cultures drawn, IV antibiotics and IVF given. Taken to OR for debridement 7/31.   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 Arita Miss 08/27/2020.  - Wound culture growing group C strep. Should be susceptible to PCN/beta lactam. TTE negative for vegetation and blood cultures NGTD x2 days. Will ask for ID recommendations due to pt's severity of illness on presentation and high risk features, namely IVDU. - Wound care per plastic surgery.   T1DM with hyperglycemia: HbA1c 11.6% last month and not consistently adherent to insulin at home (50u daily basal + 1-12u SS humalog) - Increase basal insulin to 35u, increase mealtime to 8u TIDWC (if eats 50% of more of meal), continue resistant SSI. CBG grossly elevated this AM, will check BMP, administer insulins as ordered with the caveat that emesis prevents po intake, so will not advise mealtime coverage. Recheck in 1 hour.   HTN urgency:  - Increased clonidine with some improvement. Will add losartan given DM and good renal function.    Heroin use: No evidence of withdrawal at this time. - Monitor for withdrawal.  - Pain control as ordered, requiring higher doses due to tolerance. Augment oral dosing with plans to DC IV in  next 24 hours.   DVT prophylaxis: Lovenox Code Status: Full Family Communication: None at bedside Disposition Plan:  Status is: Inpatient  Remains inpatient appropriate because:Inpatient level of care appropriate due to severity of illness  Dispo: The patient is from: Home              Anticipated d/c is to: Home              Patient currently is not medically stable to d/c.   Difficult to place patient No  Consultants:  Plastic surgery Infectious Diseases  Procedures:  08/27/2020, Dr. Merry Proud:  1.  Debridement left wrist wound totaling 4 x 4 cm including skin and subcutaneous tissue 2.  Irrigation and debridement left volar wrist abscess 3.  Open left carpal tunnel release  Antimicrobials: Vancomycin, zosyn, clindamycin   Subjective: Had emesis this morning, CBG way up. Pain improved more with IV med than po, though doesn't last long. No fever. Swelling improving considerably. BPs also up. Walking to bathroom.  Objective: Vitals:   08/28/20 1747 08/28/20 1847 08/28/20 2315 08/29/20 0456  BP:   (!) 147/77 131/74  Pulse: 99 (!) 101 (!) 103 98  Resp: (!) 21 (!) 24 (!) 21 20  Temp:    98.5 F (36.9 C)  TempSrc:    Axillary  SpO2: 98% 100% 98% 100%  Weight:      Height:        Intake/Output Summary (Last 24 hours) at 08/29/2020 9741 Last data filed at 08/28/2020 1840 Gross per 24 hour  Intake --  Output 900  ml  Net -900 ml   Filed Weights   08/27/20 0121 08/27/20 1731  Weight: 68 kg 68.3 kg   Gen: 30 y.o. female in no distress Pulm: Nonlabored breathing room air. Clear. CV: Regular rate and rhythm. No murmur, rub, or gallop. No JVD, no dependent edema. GI: Abdomen soft, non-tender, non-distended, with normoactive bowel sounds.  Ext: Warm, no deformities Skin: No new rashes, lesions or ulcers on visualized skin. Left wrist dressing c/d/I, edema on the arm is much diminished, no spreading erythema or induration. Neuro: Alert and oriented. No focal neurological  deficits. Psych: Judgement and insight appear fair. Mood euthymic & affect congruent. Behavior is appropriate.    Data Reviewed: I have personally reviewed following labs and imaging studies  CBC: Recent Labs  Lab 08/27/20 0130 08/27/20 2055 08/28/20 0138  WBC 21.1* 21.2* 19.9*  NEUTROABS 17.7* 18.2*  --   HGB 11.3* 10.2* 10.4*  HCT 35.0* 32.6* 31.7*  MCV 83.9 85.3 82.8  PLT 344 323 310   Basic Metabolic Panel: Recent Labs  Lab 08/27/20 0130 08/28/20 0138  NA 129* 130*  K 3.7 4.0  CL 93* 95*  CO2 23 23  GLUCOSE 291* 314*  BUN 12 7  CREATININE 0.94 0.79  CALCIUM 9.4 8.5*   GFR: Estimated Creatinine Clearance: 99.1 mL/min (by C-G formula based on SCr of 0.79 mg/dL). Liver Function Tests: Recent Labs  Lab 08/27/20 0130  AST 18  ALT 13  ALKPHOS 133*  BILITOT 0.2*  PROT 8.6*  ALBUMIN 3.7   No results for input(s): LIPASE, AMYLASE in the last 168 hours. No results for input(s): AMMONIA in the last 168 hours. Coagulation Profile: No results for input(s): INR, PROTIME in the last 168 hours. Cardiac Enzymes: No results for input(s): CKTOTAL, CKMB, CKMBINDEX, TROPONINI in the last 168 hours. BNP (last 3 results) No results for input(s): PROBNP in the last 8760 hours. HbA1C: No results for input(s): HGBA1C in the last 72 hours. CBG: Recent Labs  Lab 08/28/20 1542 08/28/20 1827 08/28/20 2041 08/28/20 2308 08/29/20 0805  GLUCAP 219* 252* 214* 270* 472*   Lipid Profile: No results for input(s): CHOL, HDL, LDLCALC, TRIG, CHOLHDL, LDLDIRECT in the last 72 hours. Thyroid Function Tests: No results for input(s): TSH, T4TOTAL, FREET4, T3FREE, THYROIDAB in the last 72 hours. Anemia Panel: No results for input(s): VITAMINB12, FOLATE, FERRITIN, TIBC, IRON, RETICCTPCT in the last 72 hours. Urine analysis:    Component Value Date/Time   COLORURINE COLORLESS (A) 07/25/2016 0730   APPEARANCEUR CLEAR 07/25/2016 0730   LABSPEC 1.025 07/25/2016 0730   PHURINE 6.0  07/25/2016 0730   GLUCOSEU >=500 (A) 07/25/2016 0730   HGBUR NEGATIVE 07/25/2016 0730   BILIRUBINUR NEGATIVE 07/25/2016 0730   KETONESUR 80 (A) 07/25/2016 0730   PROTEINUR NEGATIVE 07/25/2016 0730   UROBILINOGEN 1.0 03/16/2015 1713   NITRITE NEGATIVE 07/25/2016 0730   LEUKOCYTESUR NEGATIVE 07/25/2016 0730   Recent Results (from the past 240 hour(s))  Blood culture (routine x 2)     Status: None (Preliminary result)   Collection Time: 08/27/20  1:10 AM   Specimen: BLOOD RIGHT ARM  Result Value Ref Range Status   Specimen Description BLOOD RIGHT ARM  Final   Special Requests   Final    BOTTLES DRAWN AEROBIC AND ANAEROBIC Blood Culture adequate volume   Culture   Final    NO GROWTH 2 DAYS Performed at [facility], [address], [place], [place] [address]    Report Status PENDING  Incomplete  Blood culture (routine x 2)     Status: None (Preliminary result)   Collection Time: 08/27/20  1:27 AM   Specimen: BLOOD RIGHT HAND  Result Value Ref Range Status   Specimen Description BLOOD RIGHT HAND  Final   Special Requests   Final    BOTTLES DRAWN AEROBIC ONLY Blood Culture results may not be optimal due to an excessive volume of blood received in culture bottles   Culture   Final    NO GROWTH 2 DAYS Performed at [facility], [address] [place], [place] [address]    Report Status PENDING  Incomplete  SARS CORONAVIRUS 2 (TAT 6-24 HRS) Nasopharyngeal Nasopharyngeal Swab     Status: None   Collection Time: 08/27/20  6:53 AM   Specimen: Nasopharyngeal Swab  Result Value Ref Range Status   SARS Coronavirus 2 NEGATIVE NEGATIVE Final    Comment: (NOTE) SARS-CoV-2 target nucleic acids are NOT DETECTED.  The SARS-CoV-2 RNA is generally detectable in upper and lower respiratory specimens during the acute phase of infection. Negative results do not preclude SARS-CoV-2 infection, do not rule out co-infections with other pathogens, and should not be used as the sole basis  for treatment or other patient management decisions. Negative results must be combined with clinical observations, patient history, and epidemiological information. The expected result is Negative.  Fact Sheet for Patients: HairSlick.no  Fact Sheet for Healthcare Providers: quierodirigir.com  This test is not yet approved or cleared by the [place] FDA and  has been authorized for detection and/or diagnosis of SARS-CoV-2 by FDA under an Emergency Use Authorization (EUA). This EUA will remain  in effect (meaning this test can be used) for the duration of the COVID-19 declaration under Se ction 564(b)(1) of the Act, 21 U.S.C. section 360bbb-3(b)(1), unless the authorization is terminated or revoked sooner.  Performed at [facility], [address], [place], [place] [address]   Anaerobic culture w Gram Stain     Status: None (Preliminary result)   Collection Time: 08/27/20  2:05 PM   Specimen: PATH Other; Tissue  Result Value Ref Range Status   Specimen Description TISSUE  Final   Special Requests LEFT WRIST ABCESS SPEC A  Final   Gram Stain   Final    RARE WBC PRESENT,BOTH PMN AND MONONUCLEAR ABUNDANT GRAM POSITIVE COCCI IN PAIRS MODERATE GRAM NEGATIVE COCCOBACILLI Performed at [facility], [address], [place], [place] [address]    Culture PENDING  Incomplete   Report Status PENDING  Incomplete  Aerobic/Anaerobic Culture w Gram Stain (surgical/deep wound)     Status: None (Preliminary result)   Collection Time: 08/27/20  2:08 PM   Specimen: PATH Other; Body Fluid  Result Value Ref Range Status   Specimen Description FLUID  Final   Special Requests LEFT WRIST ABSCESS FLUID SPEC B  Final   Gram Stain   Final    FEW WBC PRESENT,BOTH PMN AND MONONUCLEAR MODERATE GRAM NEGATIVE RODS MODERATE GRAM POSITIVE COCCI IN PAIRS IN CLUSTERS    Culture   Final    ABUNDANT STREPTOCOCCUS GROUP C Beta hemolytic  streptococci are predictably susceptible to penicillin and other beta lactams. Susceptibility testing not routinely performed. Performed at [facility], [address], [place], [place] [address]    Report Status PENDING  Incomplete      Radiology Studies: ECHOCARDIOGRAM COMPLETE  Result Date: 08/28/2020    ECHOCARDIOGRAM REPORT   Patient Name:  [name] Date of Exam: 08/28/2020 Medical Rec #:  [id number]       Height:       67.0 in Accession #:    [id number]      Weight:       150.6 lb Date of Birth:  1990-02-26       BSA:          1.792 m Patient Age:    29 years        BP:           152/90 mmHg Patient Gender: F               HR:           120 bpm. Exam Location:  Inpatient Procedure: 2D Echo, Cardiac Doppler and Color Doppler Indications:    Endocarditis  History:        Patient has prior history of Echocardiogram examinations, most                 recent 07/11/2020. Arrythmias:Tachycardia,                 Signs/Symptoms:Dyspnea; Risk Factors:Diabetes. IV durg abuse,                 Herion abuse, Left arm cellulitis, Hep. C.  Sonographer:    Lavenia Atlas Referring Phys: [id number] BRADLEY S CHOTINER IMPRESSIONS  1. Left ventricular ejection fraction, by estimation, is 65 to 70%. The left ventricle has normal function. The left ventricle has no regional wall motion abnormalities. Left ventricular diastolic parameters were normal.  2. Right ventricular systolic function is normal. The right ventricular size is normal. Tricuspid regurgitation signal is inadequate for assessing PA pressure.  3. The mitral valve is normal in structure. No evidence of mitral valve regurgitation.  4. The aortic valve is tricuspid. Aortic valve regurgitation is not visualized. No aortic stenosis is present.  5. The inferior vena cava is normal in size with greater than 50% respiratory variability, suggesting right atrial pressure of 3 mmHg. Conclusion(s)/Recommendation(s): No vegetation seen. If high clinical  suspicion for endocarditis, consider TEE. FINDINGS  Left Ventricle: Left ventricular ejection fraction, by estimation, is 65 to 70%. The left ventricle has normal function. The left ventricle has no regional wall motion abnormalities. The left ventricular internal cavity size was normal in size. There is  no left ventricular hypertrophy. Left ventricular diastolic parameters were normal. Right Ventricle: The right ventricular size is normal. No increase in right ventricular wall thickness. Right ventricular systolic function is normal. Tricuspid regurgitation signal is inadequate for assessing PA pressure. Left Atrium: Left atrial size was normal in size. Right Atrium: Right atrial size was normal in size. Pericardium: Trivial pericardial effusion is present. Mitral Valve: The mitral valve is normal in structure. No evidence of mitral valve regurgitation. Tricuspid Valve: The tricuspid valve is normal in structure. Tricuspid valve regurgitation is trivial. Aortic Valve: The aortic valve is tricuspid. Aortic valve regurgitation is not visualized. No aortic stenosis is present. Pulmonic Valve: The pulmonic valve was not well visualized. Pulmonic valve regurgitation is not visualized. Aorta: The aortic root and ascending aorta are structurally normal, with no evidence of dilitation. Venous: The inferior vena cava is normal in size with greater than 50% respiratory variability, suggesting right atrial pressure of 3 mmHg. IAS/Shunts: The interatrial septum was not well visualized.  LEFT VENTRICLE PLAX 2D LVIDd:         4.00 cm  Diastology LVIDs:  2.70 cm  LV e' medial:    9.03 cm/s LV PW:         0.90 cm  LV E/e' medial:  9.0 LV IVS:        1.00 cm  LV e' lateral:   12.20 cm/s LVOT diam:     1.80 cm  LV E/e' lateral: 6.7 LV SV:         64 LV SV Index:   36 LVOT Area:     2.54 cm  RIGHT VENTRICLE RV Basal diam:  2.50 cm RV S prime:     14.70 cm/s TAPSE (M-mode): 2.5 cm LEFT ATRIUM             Index       RIGHT  ATRIUM           Index LA diam:        2.30 cm 1.28 cm/m  RA Area:     12.30 cm LA Vol (A2C):   26.9 ml 15.01 ml/m RA Volume:   29.00 ml  16.18 ml/m LA Vol (A4C):   23.6 ml 13.17 ml/m LA Biplane Vol: 25.8 ml 14.39 ml/m  AORTIC VALVE LVOT Vmax:   130.00 cm/s LVOT Vmean:  81.500 cm/s LVOT VTI:    0.253 m  AORTA Ao Root diam: 2.70 cm MITRAL VALVE MV Area (PHT): 7.66 cm    SHUNTS MV Decel Time: 99 msec     Systemic VTI:  0.25 m MV E velocity: 81.40 cm/s  Systemic Diam: 1.80 cm MV A velocity: 90.00 cm/s MV E/A ratio:  0.90 Epifanio Lesches MD Electronically signed by Epifanio Lesches MD Signature Date/Time: 08/28/2020/3:32:45 PM    Final    CT Extrem Up Entire Arm L W/CM  Result Date: 08/27/2020 CLINICAL DATA:  Wound on the left wrist pain and swelling. History of IV drug abuse. EXAM: CT OF THE UPPER LEFT EXTREMITY WITH CONTRAST TECHNIQUE: Multidetector CT imaging of the upper left extremity was performed according to the standard protocol following intravenous contrast administration. CONTRAST:  100 mL Omnipaque 350. COMPARISON:  Plain films left wrist and hand today. FINDINGS: Bones/Joint/Cartilage No bony destructive change or periosteal reaction is identified. No fracture or dislocation. No joint effusion. Ligaments Suboptimally assessed by CT. Muscles and Tendons No gas is seen within muscle or tracking along fascial planes. No intramuscular abscess is identified. There is fluid between muscles in the flexor compartments of the forearm. Soft tissues Diffuse subcutaneous edema in the arm is worst in the forearm and about the wrist and hand. No abscess is identified. IMPRESSION: Diffuse subcutaneous edema in the left arm is worst in the forearm and hand in consistent with cellulitis. Edema and fluid tracking along fascia in the flexor compartment of the forearm is compatible with fasciitis. There is no gas within fascia to suggest necrotizing fasciitis. Negative for abscess or evidence of  osteomyelitis. Electronically Signed   By: Drusilla Kanner M.D.   On: 08/27/2020 11:04    Scheduled Meds:  Chlorhexidine Gluconate Cloth  6 each Topical Daily   cloNIDine  0.2 mg Oral Q8H   enoxaparin (LOVENOX) injection  40 mg Subcutaneous Q24H   insulin aspart  0-20 Units Subcutaneous TID WC   insulin aspart  0-5 Units Subcutaneous QHS   insulin aspart  8 Units Subcutaneous TID WC   insulin glargine-yfgn  35 Units Subcutaneous Daily   losartan  25 mg Oral Daily   nicotine  7 mg Transdermal Daily   Continuous Infusions:  clindamycin (  CLEOCIN) IV 600 mg (08/29/20 0610)   lactated ringers Stopped (08/28/20 1249)   piperacillin-tazobactam (ZOSYN)  IV 3.375 g (08/29/20 0723)   vancomycin 1,000 mg (08/28/20 2304)     LOS: 2 days   Time spent: 35 minutes.  Tyrone Nineyan B Briselda Naval, MD [facility] [email or web address] 08/29/2020, 9:37 AM

## 2020-08-29 NOTE — Plan of Care (Signed)

## 2020-08-30 LAB — BASIC METABOLIC PANEL
Anion gap: 10 (ref 5–15)
BUN: 26 mg/dL — ABNORMAL HIGH (ref 6–20)
CO2: 19 mmol/L — ABNORMAL LOW (ref 22–32)
Calcium: 8.6 mg/dL — ABNORMAL LOW (ref 8.9–10.3)
Chloride: 102 mmol/L (ref 98–111)
Creatinine, Ser: 1.03 mg/dL — ABNORMAL HIGH (ref 0.44–1.00)
GFR, Estimated: >60 mL/min (ref >60)
Glucose, Bld: 357 mg/dL — ABNORMAL HIGH (ref 70–99)
Potassium: 4.1 mmol/L (ref 3.5–5.1)
Sodium: 131 mmol/L — ABNORMAL LOW (ref 135–145)

## 2020-08-30 LAB — GLUCOSE, CAPILLARY
Glucose-Capillary: 118 mg/dL — ABNORMAL HIGH (ref 70–99)
Glucose-Capillary: 146 mg/dL — ABNORMAL HIGH (ref 70–99)
Glucose-Capillary: 160 mg/dL — ABNORMAL HIGH (ref 70–99)
Glucose-Capillary: 205 mg/dL — ABNORMAL HIGH (ref 70–99)
Glucose-Capillary: 235 mg/dL — ABNORMAL HIGH (ref 70–99)
Glucose-Capillary: 278 mg/dL — ABNORMAL HIGH (ref 70–99)
Glucose-Capillary: 362 mg/dL — ABNORMAL HIGH (ref 70–99)

## 2020-08-30 LAB — CBC
HCT: 33.4 % — ABNORMAL LOW (ref 36.0–46.0)
Hemoglobin: 10.7 g/dL — ABNORMAL LOW (ref 12.0–15.0)
MCH: 26.7 pg (ref 26.0–34.0)
MCHC: 32 g/dL (ref 30.0–36.0)
MCV: 83.3 fL (ref 80.0–100.0)
Platelets: 481 10*3/uL — ABNORMAL HIGH (ref 150–400)
RBC: 4.01 MIL/uL (ref 3.87–5.11)
RDW: 14.6 % (ref 11.5–15.5)
WBC: 12.8 10*3/uL — ABNORMAL HIGH (ref 4.0–10.5)
nRBC: 0 % (ref 0.0–0.2)

## 2020-08-30 MED ORDER — OXYCODONE HCL 5 MG PO TABS
10.0000 mg | ORAL_TABLET | ORAL | Status: DC | PRN
  Administered 2020-08-30 – 2020-08-31 (×6): 10 mg via ORAL
  Filled 2020-08-30 (×6): qty 2

## 2020-08-30 MED ORDER — INSULIN GLARGINE-YFGN 100 UNIT/ML ~~LOC~~ SOLN
50.0000 [IU] | Freq: Every day | SUBCUTANEOUS | Status: DC
  Administered 2020-08-30 – 2020-08-31 (×2): 50 [IU] via SUBCUTANEOUS
  Filled 2020-08-30 (×2): qty 0.5

## 2020-08-30 MED ORDER — SODIUM CHLORIDE 0.9 % IV BOLUS
1000.0000 mL | Freq: Once | INTRAVENOUS | Status: AC
  Administered 2020-08-30: 1000 mL via INTRAVENOUS

## 2020-08-30 MED ORDER — INSULIN ASPART 100 UNIT/ML IJ SOLN
10.0000 [IU] | Freq: Three times a day (TID) | INTRAMUSCULAR | Status: DC
  Administered 2020-08-30 – 2020-08-31 (×4): 10 [IU] via SUBCUTANEOUS

## 2020-08-30 NOTE — Plan of Care (Signed)

## 2020-08-30 NOTE — Progress Notes (Signed)
PROGRESS NOTE  Anna Dennis  QPY:[id number] DOB: [birth year] DOA: 08/27/2020 PCP: Dartha Lodge, FNP   Brief Narrative: Anna Dennis is a 30 y.o. 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 Found to be septic, hyperglycemic. Blood cultures drawn, IV antibiotics and IVF given. Taken to OR for debridement 7/31. Cultures have been growing, ID consulted for assistance with antibiotic recommendations. Plastic surgery recommends continuing with wet-to-dry dressing changes twice daily and follow up in clinic.   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 Arita Miss 08/27/2020.  - Wound culture growing group C strep and GN coccobacilli. Continuing zosyn. ID recommends continuing clindamycin thru today, then off. Blood cultures NGTD.  - Will await ID recommendations pending speciation/susceptibility reporting of gram negative in wound culture.  - Wound care per plastic surgery. W>D BID, follow up in clinic. We will continue instructing patient in wound care.    T1DM with hyperglycemia: HbA1c 11.6% last month and not consistently adherent to insulin at home (50u daily basal + 1-12u SS humalog) - Increase basal insulin to 50u, increase mealtime to 10u TIDWC, resistant SSI.   HTN urgency:  - We will hold BP medications for now.    Hypotensive episode 8/2: No evidence of uncontrolled infection, bleeding, dehydration. Improved over the course of a few hours. Occurrence following boyfriend's visit prompts some  staff to suspect illicit drug use.  - UDS unlikely to be helpful without specifying specific opioid detected. Will continue monitoring closely.  Heroin use: No evidence of withdrawal at this time. - Monitor for withdrawal.  - Pain control as ordered. Will not restart IV pain medications since she's tolerating po.   DVT prophylaxis: Lovenox Code Status: Full Family Communication: None at bedside Disposition Plan:  Status is: Inpatient  Remains inpatient appropriate because:Inpatient level of care appropriate due to severity of illness  Dispo: The patient is from: Home              Anticipated d/c is to: Home              Patient currently is not medically stable to d/c.   Difficult to place patient No  Consultants:  Plastic surgery Infectious Diseases  Procedures:  08/27/2020, Dr. Merry Proud:  1.  Debridement left wrist wound totaling 4 x 4 cm including skin and subcutaneous tissue 2.  Irrigation and debridement left volar wrist abscess 3.  Open left carpal tunnel release  Antimicrobials: Vancomycin, zosyn, clindamycin   Subjective: Low BP last night, but better this morning. Denies any drug use. No fever. Pain in left wrist and palm of the hand is stable, constant, worse with movement.   Objective: Vitals:   08/30/20 0545 08/30/20 0742 08/30/20 0830 08/30/20 1201  BP: 113/75 106/69 113/70 126/66  Pulse: 75  89   Resp:   17   Temp:   98.5 F (36.9 C)   TempSrc:   Oral   SpO2:    90%  Weight:      Height:  Intake/Output Summary (Last 24 hours) at 08/30/2020 1441 Last data filed at 08/30/2020 1655 Gross per 24 hour  Intake 1361.37 ml  Output 600 ml  Net 761.37 ml   Filed Weights   08/27/20 0121 08/27/20 1731  Weight: 68 kg 68.3 kg   Gen: 30 y.o. female in no distress Pulm: Nonlabored breathing room air. Clear. CV: Regular rate and rhythm. No murmur, rub, or gallop. No JVD, no dependent edema. GI: Abdomen soft, non-tender, non-distended, with normoactive  bowel sounds.  Ext: Warm, no deformities. NVI in left hand. Skin: No new rashes, lesions or ulcers on visualized skin. Wound pictured below from exam this morning. There was no purulence, bleeding or odor. Also note scars from cutting on left forearm. Neuro: Alert and oriented. No focal neurological deficits. Psych: Judgement and insight appear fair. Mood euthymic & affect congruent. Behavior is appropriate.       Data Reviewed: I have personally reviewed following labs and imaging studies  CBC: Recent Labs  Lab 08/27/20 0130 08/27/20 2055 08/28/20 0138 08/29/20 1052 08/30/20 0247  WBC 21.1* 21.2* 19.9* 17.3* 12.8*  NEUTROABS 17.7* 18.2*  --  15.4*  --   HGB 11.3* 10.2* 10.4* 11.8* 10.7*  HCT 35.0* 32.6* 31.7* 37.5 33.4*  MCV 83.9 85.3 82.8 84.3 83.3  PLT 344 323 310 536* 481*   Basic Metabolic Panel: Recent Labs  Lab 08/27/20 0130 08/28/20 0138 08/29/20 1052 08/30/20 0247  NA 129* 130* 127* 131*  K 3.7 4.0 4.6 4.1  CL 93* 95* 98 102  CO2 23 23 7* 19*  GLUCOSE 291* 314* 489* 357*  BUN 12 7 18  26*  CREATININE 0.94 0.79 1.26* 1.03*  CALCIUM 9.4 8.5* 8.6* 8.6*   GFR: Estimated Creatinine Clearance: 77 mL/min (A) (by C-G formula based on SCr of 1.03 mg/dL (H)). Liver Function Tests: Recent Labs  Lab 08/27/20 0130  AST 18  ALT 13  ALKPHOS 133*  BILITOT 0.2*  PROT 8.6*  ALBUMIN 3.7   No results for input(s): LIPASE, AMYLASE in the last 168 hours. No results for input(s): AMMONIA in the last 168 hours. Coagulation Profile: No results for input(s): INR, PROTIME in the last 168 hours. Cardiac Enzymes: No results for input(s): CKTOTAL, CKMB, CKMBINDEX, TROPONINI in the last 168 hours. BNP (last 3 results) No results for input(s): PROBNP in the last 8760 hours. HbA1C: No results for input(s): HGBA1C in the last 72 hours. CBG: Recent Labs  Lab 08/29/20 2245 08/30/20 0033 08/30/20 0644 08/30/20 0741 08/30/20 1200  GLUCAP 154* 235* 362* 278* 205*   Lipid  Profile: No results for input(s): CHOL, HDL, LDLCALC, TRIG, CHOLHDL, LDLDIRECT in the last 72 hours. Thyroid Function Tests: No results for input(s): TSH, T4TOTAL, FREET4, T3FREE, THYROIDAB in the last 72 hours. Anemia Panel: No results for input(s): VITAMINB12, FOLATE, FERRITIN, TIBC, IRON, RETICCTPCT in the last 72 hours. Urine analysis:    Component Value Date/Time   COLORURINE COLORLESS (A) 07/25/2016 0730   APPEARANCEUR CLEAR 07/25/2016 0730   LABSPEC 1.025 07/25/2016 0730   PHURINE 6.0 07/25/2016 0730   GLUCOSEU >=500 (A) 07/25/2016 0730   HGBUR NEGATIVE 07/25/2016 0730   BILIRUBINUR NEGATIVE 07/25/2016 0730   KETONESUR 80 (A) 07/25/2016 0730   PROTEINUR NEGATIVE 07/25/2016 0730   UROBILINOGEN 1.0 03/16/2015 1713   NITRITE NEGATIVE 07/25/2016 0730   LEUKOCYTESUR NEGATIVE 07/25/2016 0730   Recent Results (from the past 240 hour(s))  Blood culture (routine x 2)     Status: None (Preliminary result)   Collection  Time: 08/27/20  1:10 AM   Specimen: BLOOD RIGHT ARM  Result Value Ref Range Status   Specimen Description BLOOD RIGHT ARM  Final   Special Requests   Final    BOTTLES DRAWN AEROBIC AND ANAEROBIC Blood Culture adequate volume   Culture   Final    NO GROWTH 3 DAYS Performed at [facility], [address] [place], [place] [address]    Report Status PENDING  Incomplete  Blood culture (routine x 2)     Status: None (Preliminary result)   Collection Time: 08/27/20  1:27 AM   Specimen: BLOOD RIGHT HAND  Result Value Ref Range Status   Specimen Description BLOOD RIGHT HAND  Final   Special Requests   Final    BOTTLES DRAWN AEROBIC ONLY Blood Culture results may not be optimal due to an excessive volume of blood received in culture bottles   Culture   Final    NO GROWTH 3 DAYS Performed at [facility], [address] [place], [place] [address]    Report Status PENDING  Incomplete  SARS CORONAVIRUS 2 (TAT 6-24 HRS) Nasopharyngeal Nasopharyngeal Swab      Status: None   Collection Time: 08/27/20  6:53 AM   Specimen: Nasopharyngeal Swab  Result Value Ref Range Status   SARS Coronavirus 2 NEGATIVE NEGATIVE Final    Comment: (NOTE) SARS-CoV-2 target nucleic acids are NOT DETECTED.  The SARS-CoV-2 RNA is generally detectable in upper and lower respiratory specimens during the acute phase of infection. Negative results do not preclude SARS-CoV-2 infection, do not rule out co-infections with other pathogens, and should not be used as the sole basis for treatment or other patient management decisions. Negative results must be combined with clinical observations, patient history, and epidemiological information. The expected result is Negative.  Fact Sheet for Patients: HairSlick.nohttps://www.fda.gov/media/138098/download  Fact Sheet for Healthcare Providers: quierodirigir.comhttps://www.fda.gov/media/138095/download  This test is not yet approved or cleared by the [place] FDA and  has been authorized for detection and/or diagnosis of SARS-CoV-2 by FDA under an Emergency Use Authorization (EUA). This EUA will remain  in effect (meaning this test can be used) for the duration of the COVID-19 declaration under Se ction 564(b)(1) of the Act, 21 U.S.C. section 360bbb-3(b)(1), unless the authorization is terminated or revoked sooner.  Performed at [facility], [address] [place], [place] [address]   Anaerobic culture w Gram Stain     Status: None (Preliminary result)   Collection Time: 08/27/20  2:05 PM   Specimen: PATH Other; Tissue  Result Value Ref Range Status   Specimen Description TISSUE  Final   Special Requests LEFT WRIST ABCESS SPEC A  Final   Gram Stain   Final    RARE WBC PRESENT,BOTH PMN AND MONONUCLEAR ABUNDANT GRAM POSITIVE COCCI IN PAIRS MODERATE GRAM NEGATIVE COCCOBACILLI Performed at [facility], [address] [place], [place] [address]    Culture   Final    NO ANAEROBES ISOLATED; CULTURE IN PROGRESS FOR 5 DAYS    Report Status PENDING  Incomplete  Aerobic/Anaerobic Culture w Gram Stain (surgical/deep wound)     Status: None (Preliminary result)   Collection Time: 08/27/20  2:08 PM   Specimen: PATH Other; Body Fluid  Result Value Ref Range Status   Specimen Description FLUID  Final   Special Requests LEFT WRIST ABSCESS FLUID SPEC B  Final   Gram Stain   Final    FEW WBC PRESENT,BOTH PMN  AND MONONUCLEAR MODERATE GRAM NEGATIVE RODS MODERATE GRAM POSITIVE COCCI IN PAIRS IN CLUSTERS    Culture   Final    ABUNDANT STREPTOCOCCUS GROUP C Beta hemolytic streptococci are predictably susceptible to penicillin and other beta lactams. Susceptibility testing not routinely performed. ABUNDANT STREPTOCOCCUS CONSTELLATUS SUSCEPTIBILITIES TO FOLLOW MODERATE PREVOTELLA SPECIES BETA LACTAMASE POSITIVE Performed at [facility], [address], [place], [place] [address]    Report Status PENDING  Incomplete      Scheduled Meds:  Chlorhexidine Gluconate Cloth  6 each Topical Daily   enoxaparin (LOVENOX) injection  40 mg Subcutaneous Q24H   insulin aspart  0-20 Units Subcutaneous TID WC   insulin aspart  0-5 Units Subcutaneous QHS   insulin aspart  10 Units Subcutaneous TID WC   insulin glargine-yfgn  50 Units Subcutaneous Daily   nicotine  7 mg Transdermal Daily   Continuous Infusions:  lactated ringers 100 mL/hr at 08/30/20 1100   piperacillin-tazobactam (ZOSYN)  IV 3.375 g (08/30/20 0530)     LOS: 3 days   Time spent: 35 minutes.  Tyrone Nine, MD [facility] [email or web address] 08/30/2020, 2:41 PM

## 2020-08-30 NOTE — Progress Notes (Signed)
Patient continues to improve after debridement and drainage of the left wrist abscess.  She has good sensation and improving motion in her fingers and swelling is gone down significantly.  Cultures continue to speciated and infectious diseases on board to help guide antibiotic treatment.  At at this point I just recommend continuing with wet-to-dry dressings for the wrist.  From my standpoint she can be discharged when an appropriate antibiotic plan has been set up.  I will plan to follow-up with her in the office after discharge to make sure the wound ends up healing.  Call with any questions.

## 2020-08-30 NOTE — Progress Notes (Signed)
CAGE aid complete. Patient refused resources stating that she is already signed up with [facility].

## 2020-08-30 NOTE — Progress Notes (Signed)
Pt. Was noted with a low BP and lethargic. On call MD was notified about pt's condition. New orders for IVF bolus. BP meds dc'd.

## 2020-08-31 ENCOUNTER — Other Ambulatory Visit (HOSPITAL_COMMUNITY): Payer: Self-pay

## 2020-08-31 DIAGNOSIS — L03114 Cellulitis of left upper limb: Secondary | ICD-10-CM

## 2020-08-31 DIAGNOSIS — E1065 Type 1 diabetes mellitus with hyperglycemia: Secondary | ICD-10-CM

## 2020-08-31 DIAGNOSIS — F1110 Opioid abuse, uncomplicated: Secondary | ICD-10-CM

## 2020-08-31 LAB — GLUCOSE, CAPILLARY
Glucose-Capillary: 266 mg/dL — ABNORMAL HIGH (ref 70–99)
Glucose-Capillary: 143 mg/dL — ABNORMAL HIGH (ref 70–99)
Glucose-Capillary: 153 mg/dL — ABNORMAL HIGH (ref 70–99)
Glucose-Capillary: 155 mg/dL — ABNORMAL HIGH (ref 70–99)

## 2020-08-31 LAB — AEROBIC/ANAEROBIC CULTURE W GRAM STAIN (SURGICAL/DEEP WOUND)

## 2020-08-31 LAB — HCV RNA QUANT: HCV Quantitative: NOT DETECTED IU/mL (ref >50)

## 2020-08-31 LAB — MRSA NEXT GEN BY PCR, NASAL: MRSA by PCR Next Gen: NOT DETECTED

## 2020-08-31 MED ORDER — SODIUM CHLORIDE 0.9 % IV SOLN
2.0000 g | INTRAVENOUS | Status: DC
  Administered 2020-08-31: 2 g via INTRAVENOUS
  Filled 2020-08-31: qty 2

## 2020-08-31 MED ORDER — METRONIDAZOLE 500 MG PO TABS
500.0000 mg | ORAL_TABLET | Freq: Two times a day (BID) | ORAL | 0 refills | Status: DC
  Filled 2020-08-31: qty 30, 15d supply, fill #0

## 2020-08-31 MED ORDER — CEFDINIR 300 MG PO CAPS
300.0000 mg | ORAL_CAPSULE | Freq: Two times a day (BID) | ORAL | 0 refills | Status: DC
  Filled 2020-08-31: qty 14, 7d supply, fill #0

## 2020-08-31 MED ORDER — IBUPROFEN 600 MG PO TABS
600.0000 mg | ORAL_TABLET | Freq: Four times a day (QID) | ORAL | 0 refills | Status: DC | PRN
  Filled 2020-08-31: qty 30, 8d supply, fill #0

## 2020-08-31 MED ORDER — METRONIDAZOLE 500 MG PO TABS
500.0000 mg | ORAL_TABLET | Freq: Two times a day (BID) | ORAL | Status: DC
  Administered 2020-08-31: 500 mg via ORAL
  Filled 2020-08-31: qty 1

## 2020-08-31 MED ORDER — AMLODIPINE BESYLATE 5 MG PO TABS
5.0000 mg | ORAL_TABLET | Freq: Every day | ORAL | 0 refills | Status: DC
  Filled 2020-08-31: qty 30, 30d supply, fill #0

## 2020-08-31 MED ORDER — AMLODIPINE BESYLATE 5 MG PO TABS
5.0000 mg | ORAL_TABLET | Freq: Every day | ORAL | Status: DC
  Administered 2020-08-31: 5 mg via ORAL
  Filled 2020-08-31: qty 1

## 2020-08-31 NOTE — TOC Initial Note (Signed)
Transition of Care [facility]) - Initial/Assessment Note    Patient Details  Name: Anna Dennis MRN: [id number] Date of Birth: [birth year]  Transition of [facility]) CM/SW Contact:    Lockie Pares, RN Phone Number: 08/31/2020, 1:18 PM  Clinical Narrative:                 30 year old patient in with wounds from IV drug use. Uninsured, referred to [facility] efor follow up, establish PCP. MATCH done for medication assistance, PLEASE USE TOC PHARMACY FOR PRESCRIPTIONS  Expected Discharge Plan: Home/Self Care Barriers to Discharge: Inadequate or no insurance   Patient Goals and CMS Choice        Expected Discharge Plan and Services Expected Discharge Plan: Home/Self Care   Discharge Planning Services: MATCH Program, Medication Assistance     Expected Discharge Date: 08/31/20                                    Prior Living Arrangements/Services     Patient language and need for interpreter reviewed:: Yes        Need for Family Participation in Patient Care: Yes (Comment) Care giver support system in place?: Yes (comment)   Criminal Activity/Legal Involvement Pertinent to Current Situation/Hospitalization: No - Comment as needed  Activities of Daily Living   ADL Screening (condition at time of admission) Patient's cognitive ability adequate to safely complete daily activities?: Yes Is the patient deaf or have difficulty hearing?: No Does the patient have difficulty seeing, even when wearing glasses/contacts?: No Does the patient have difficulty concentrating, remembering, or making decisions?: No Patient able to express need for assistance with ADLs?: Yes Does the patient have difficulty dressing or bathing?: Yes Independently performs ADLs?: No Communication: Independent Dressing (OT): Needs assistance Is this a change from baseline?: Pre-admission baseline Grooming: Needs assistance (had surgery on lleft hand) Is this a change from baseline?:  Pre-admission baseline Feeding: Independent Bathing: Needs assistance Is this a change from baseline?: Pre-admission baseline Toileting: Needs assistance (ad surgery on left hand) Is this a change from baseline?: Pre-admission baseline In/Out Bed: Needs assistance Is this a change from baseline?: Pre-admission baseline Walks in Home: Independent Does the patient have difficulty walking or climbing stairs?: No Weakness of Legs: None Weakness of Arms/Hands: Left  Permission Sought/Granted                  Emotional Assessment       Orientation: : Oriented to Place, Oriented to  Time, Oriented to Self, Oriented to Situation Alcohol / Substance Use: Illicit Drugs Psych Involvement: No (comment)  Admission diagnosis:  Cellulitis of left upper extremity [L03.114] Sepsis (HCC) [A41.9] Patient Active Problem List   Diagnosis Date Noted   Type 1 diabetes mellitus with hyperglycemia (HCC) 08/27/2020   Sepsis (HCC) 08/27/2020   Cellulitis of arm, left 08/27/2020   Necrotizing fasciitis (HCC) 07/11/2020   Hyperkalemia 09/05/2015   Syncope 09/05/2015   Acute kidney injury (nontraumatic) (HCC)    Heroin abuse (HCC)    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 [place] [address] Phone: [phone] Fax: [phone]  [facility] [address] [place] [address] Phone: [phone] Fax: [phone]     Social Determinants of Health (SDOH) Interventions    Readmission Risk  Interventions No flowsheet data found.

## 2020-08-31 NOTE — Progress Notes (Signed)
Discharge information was reviewed with [name], and placed in the discharge packet.

## 2020-08-31 NOTE — Progress Notes (Signed)
PROGRESS NOTE  Anna Rumpsrielle Brugh  ZOX:[id number] DOB: 01/17/1991 DOA: 08/27/2020 PCP: Dartha LodgeSteele, Anthony, FNP   Brief Narrative: Anna Dennis is a 30 y.o. 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 Found to be septic, hyperglycemic. Blood cultures drawn, IV antibiotics and IVF given. Taken to OR for debridement 7/31. Cultures have been growing, ID consulted for assistance with antibiotic recommendations. Plastic surgery recommends continuing with wet-to-dry dressing changes twice daily and follow up in clinic.   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 Arita MissPace 08/27/2020.  - Wound culture growing group C strep, S. constellatus, and Prevotella (beta lactamase expressing). Continuing zosyn. D/w ID. We will need final susceptibilities before discharging on an oral regimen.  - Wound care per plastic surgery. W>D BID, follow up in clinic. We will continue instructing patient in wound care. She has experience from prior LUE wound.   T1DM with hyperglycemia: HbA1c 11.6% last month and not consistently adherent to insulin at home (50u daily basal + 1-12u SS humalog) - Continue basal insulin to 50u, mealtime to 10u TIDWC, resistant SSI.   HTN urgency:  - Restart norvasc   Hypotensive episode 8/2: No evidence of uncontrolled infection, bleeding, dehydration. Improved over the course of a few hours. Occurrence following boyfriend's visit prompts some staff to suspect illicit drug use.  - UDS unlikely to be  helpful without specifying specific opioid detected. Will continue monitoring closely. No recurrence or alternative etiologies found.  Heroin use: No evidence of withdrawal at this time. - Monitor for withdrawal.  - Pain control as ordered. Will not restart IV pain medications since she's tolerating po.   DVT prophylaxis: Lovenox Code Status: Full Family Communication: None at bedside Disposition Plan:  Status is: Inpatient  Remains inpatient appropriate because:Inpatient level of care appropriate due to severity of illness  Dispo: The patient is from: Home              Anticipated d/c is to: Home once cleared by ID              Patient currently is not medically stable to d/c.   Difficult to place patient No  Consultants:  Plastic surgery Infectious Diseases  Procedures:  08/27/2020, Dr. Merry Proudollier Pace:  1.  Debridement left wrist wound totaling 4 x 4 cm including skin and subcutaneous tissue 2.  Irrigation and debridement left volar wrist abscess 3.  Open left carpal tunnel release  Antimicrobials: Vancomycin, zosyn, clindamycin   Subjective: CBGs better, BP better, pain controlled. No fever.   Objective: Vitals:   08/30/20 2155 08/31/20 0000 08/31/20 0900 08/31/20 0917  BP: (!) 122/92 140/88 (!) 146/80 (!) 146/80  Pulse: 88  (!) 102 (!) 109  Resp: 18  19 14   Temp: 98.7 F (37.1 C)  98.6 F (37 C)   TempSrc: Oral  Oral   SpO2: 99%  99% 98%  Weight:      Height:        Intake/Output Summary (Last 24 hours) at 08/31/2020 1044 Last data filed  at 08/30/2020 1441 Gross per 24 hour  Intake 276.23 ml  Output --  Net 276.23 ml   Filed Weights   08/27/20 0121 08/27/20 1731  Weight: 68 kg 68.3 kg   Gen: 30 y.o. female in no distress Pulm: Nonlabored breathing room air. Clear. CV: Regular rate and rhythm. No murmur, rub, or gallop. No JVD, no dependent edema. GI: Abdomen soft, non-tender, non-distended, with normoactive bowel sounds.  Ext: Warm, no deformities Skin:  Wound below is wrapped, c/d/I without other rashes, lesions or ulcers on visualized skin. Neuro: Alert and oriented. No focal neurological deficits. Psych: Judgement and insight appear fair. Mood euthymic & affect congruent. Behavior is appropriate.       Data Reviewed: I have personally reviewed following labs and imaging studies  CBC: Recent Labs  Lab 08/27/20 0130 08/27/20 2055 08/28/20 0138 08/29/20 1052 08/30/20 0247  WBC 21.1* 21.2* 19.9* 17.3* 12.8*  NEUTROABS 17.7* 18.2*  --  15.4*  --   HGB 11.3* 10.2* 10.4* 11.8* 10.7*  HCT 35.0* 32.6* 31.7* 37.5 33.4*  MCV 83.9 85.3 82.8 84.3 83.3  PLT 344 323 310 536* 481*   Basic Metabolic Panel: Recent Labs  Lab 08/27/20 0130 08/28/20 0138 08/29/20 1052 08/30/20 0247  NA 129* 130* 127* 131*  K 3.7 4.0 4.6 4.1  CL 93* 95* 98 102  CO2 23 23 7* 19*  GLUCOSE 291* 314* 489* 357*  BUN 12 7 18  26*  CREATININE 0.94 0.79 1.26* 1.03*  CALCIUM 9.4 8.5* 8.6* 8.6*   GFR: Estimated Creatinine Clearance: 77 mL/min (A) (by C-G formula based on SCr of 1.03 mg/dL (H)). Liver Function Tests: Recent Labs  Lab 08/27/20 0130  AST 18  ALT 13  ALKPHOS 133*  BILITOT 0.2*  PROT 8.6*  ALBUMIN 3.7   No results for input(s): LIPASE, AMYLASE in the last 168 hours. No results for input(s): AMMONIA in the last 168 hours. Coagulation Profile: No results for input(s): INR, PROTIME in the last 168 hours. Cardiac Enzymes: No results for input(s): CKTOTAL, CKMB, CKMBINDEX, TROPONINI in the last 168 hours. BNP (last 3 results) No results for input(s): PROBNP in the last 8760 hours. HbA1C: No results for input(s): HGBA1C in the last 72 hours. CBG: Recent Labs  Lab 08/30/20 1719 08/30/20 1929 08/30/20 2212 08/31/20 0551 08/31/20 0724  GLUCAP 118* 146* 160* 266* 143*   Lipid Profile: No results for input(s): CHOL, HDL, LDLCALC, TRIG, CHOLHDL, LDLDIRECT in the last 72 hours. Thyroid Function Tests: No results for input(s): TSH,  T4TOTAL, FREET4, T3FREE, THYROIDAB in the last 72 hours. Anemia Panel: No results for input(s): VITAMINB12, FOLATE, FERRITIN, TIBC, IRON, RETICCTPCT in the last 72 hours. Urine analysis:    Component Value Date/Time   COLORURINE COLORLESS (A) 07/25/2016 0730   APPEARANCEUR CLEAR 07/25/2016 0730   LABSPEC 1.025 07/25/2016 0730   PHURINE 6.0 07/25/2016 0730   GLUCOSEU >=500 (A) 07/25/2016 0730   HGBUR NEGATIVE 07/25/2016 0730   BILIRUBINUR NEGATIVE 07/25/2016 0730   KETONESUR 80 (A) 07/25/2016 0730   PROTEINUR NEGATIVE 07/25/2016 0730   UROBILINOGEN 1.0 03/16/2015 1713   NITRITE NEGATIVE 07/25/2016 0730   LEUKOCYTESUR NEGATIVE 07/25/2016 0730   Recent Results (from the past 240 hour(s))  Blood culture (routine x 2)     Status: None (Preliminary result)   Collection Time: 08/27/20  1:10 AM   Specimen: BLOOD RIGHT ARM  Result Value Ref Range Status   Specimen Description BLOOD RIGHT ARM  Final   Special Requests   Final  BOTTLES DRAWN AEROBIC AND ANAEROBIC Blood Culture adequate volume   Culture   Final    NO GROWTH 4 DAYS Performed at [facility], [address], [place], [place] [address]    Report Status PENDING  Incomplete  Blood culture (routine x 2)     Status: None (Preliminary result)   Collection Time: 08/27/20  1:27 AM   Specimen: BLOOD RIGHT HAND  Result Value Ref Range Status   Specimen Description BLOOD RIGHT HAND  Final   Special Requests   Final    BOTTLES DRAWN AEROBIC ONLY Blood Culture results may not be optimal due to an excessive volume of blood received in culture bottles   Culture   Final    NO GROWTH 4 DAYS Performed at [facility], [address] [place], [place] [address]    Report Status PENDING  Incomplete  SARS CORONAVIRUS 2 (TAT 6-24 HRS) Nasopharyngeal Nasopharyngeal Swab     Status: None   Collection Time: 08/27/20  6:53 AM   Specimen: Nasopharyngeal Swab  Result Value Ref Range Status   SARS Coronavirus 2 NEGATIVE NEGATIVE  Final    Comment: (NOTE) SARS-CoV-2 target nucleic acids are NOT DETECTED.  The SARS-CoV-2 RNA is generally detectable in upper and lower respiratory specimens during the acute phase of infection. Negative results do not preclude SARS-CoV-2 infection, do not rule out co-infections with other pathogens, and should not be used as the sole basis for treatment or other patient management decisions. Negative results must be combined with clinical observations, patient history, and epidemiological information. The expected result is Negative.  Fact Sheet for Patients: HairSlick.no  Fact Sheet for Healthcare Providers: quierodirigir.com  This test is not yet approved or cleared by the [place] FDA and  has been authorized for detection and/or diagnosis of SARS-CoV-2 by FDA under an Emergency Use Authorization (EUA). This EUA will remain  in effect (meaning this test can be used) for the duration of the COVID-19 declaration under Se ction 564(b)(1) of the Act, 21 U.S.C. section 360bbb-3(b)(1), unless the authorization is terminated or revoked sooner.  Performed at [facility], [address] [place], [place] [address]   Anaerobic culture w Gram Stain     Status: None (Preliminary result)   Collection Time: 08/27/20  2:05 PM   Specimen: PATH Other; Tissue  Result Value Ref Range Status   Specimen Description TISSUE  Final   Special Requests LEFT WRIST ABCESS SPEC A  Final   Gram Stain   Final    RARE WBC PRESENT,BOTH PMN AND MONONUCLEAR ABUNDANT GRAM POSITIVE COCCI IN PAIRS MODERATE GRAM NEGATIVE COCCOBACILLI Performed at [facility], [address] [place], [place] [address]    Culture   Final    NO ANAEROBES ISOLATED; CULTURE IN PROGRESS FOR 5 DAYS   Report Status PENDING  Incomplete  Aerobic/Anaerobic Culture w Gram Stain (surgical/deep wound)     Status: None   Collection Time: 08/27/20  2:08 PM    Specimen: PATH Other; Body Fluid  Result Value Ref Range Status   Specimen Description FLUID  Final   Special Requests LEFT WRIST ABSCESS FLUID SPEC B  Final   Gram Stain   Final    FEW WBC PRESENT,BOTH PMN AND MONONUCLEAR MODERATE GRAM NEGATIVE RODS MODERATE GRAM POSITIVE COCCI IN PAIRS IN CLUSTERS    Culture   Final    ABUNDANT STREPTOCOCCUS GROUP C Beta hemolytic streptococci are predictably susceptible to penicillin and other  beta lactams. Susceptibility testing not routinely performed. ABUNDANT STREPTOCOCCUS CONSTELLATUS MODERATE PREVOTELLA SPECIES BETA LACTAMASE POSITIVE Performed at [facility], [address], [place], [place] [address]    Report Status 08/31/2020 FINAL  Final   Organism ID, Bacteria STREPTOCOCCUS CONSTELLATUS  Final      Susceptibility   Streptococcus constellatus - MIC*    PENICILLIN 0.25 INTERMEDIATE Intermediate     CEFTRIAXONE 1 SENSITIVE Sensitive     ERYTHROMYCIN <=0.12 SENSITIVE Sensitive     LEVOFLOXACIN <=0.25 SENSITIVE Sensitive     VANCOMYCIN 0.5 SENSITIVE Sensitive     * ABUNDANT STREPTOCOCCUS CONSTELLATUS  MRSA Next Gen by PCR, Nasal     Status: None   Collection Time: 08/31/20  2:41 AM   Specimen: Nasal Mucosa; Nasal Swab  Result Value Ref Range Status   MRSA by PCR Next Gen NOT DETECTED NOT DETECTED Final    Comment: (NOTE) The GeneXpert MRSA Assay (FDA approved for NASAL specimens only), is one component of a comprehensive MRSA colonization surveillance program. It is not intended to diagnose MRSA infection nor to guide or monitor treatment for MRSA infections. Test performance is not FDA approved in patients less than 26 years old. Performed at [facility], [address] [place], [place] [address]       Scheduled Meds:  Chlorhexidine Gluconate Cloth  6 each Topical Daily   enoxaparin (LOVENOX) injection  40 mg Subcutaneous Q24H   insulin aspart  0-20 Units Subcutaneous TID WC   insulin aspart  0-5 Units  Subcutaneous QHS   insulin aspart  10 Units Subcutaneous TID WC   insulin glargine-yfgn  50 Units Subcutaneous Daily   nicotine  7 mg Transdermal Daily   Continuous Infusions:  lactated ringers 100 mL/hr at 08/30/20 1100   piperacillin-tazobactam (ZOSYN)  IV 3.375 g (08/31/20 0544)     LOS: 4 days   Time spent: 25 minutes.  Tyrone Nine, MD [facility] [email or web address] 08/31/2020, 10:44 AM

## 2020-08-31 NOTE — Discharge Summary (Signed)
Physician Discharge Summary  Anna Dennis VUY:[id number] DOB: 04/03/1990 DOA: 08/27/2020  PCP: Elbert Ewings, FNP  Admit date: 08/27/2020 Discharge date: 08/31/2020  Admitted From: Home Disposition: Home   Recommendations for Outpatient Follow-up:  Follow up with PCP in 1-2 weeks Follow up with plastic surgery, Dr. Claudia Desanctis, for wound recheck/suture removal.  Home Health: None Equipment/Devices: None Discharge Condition: Stable CODE STATUS: Full Diet recommendation: Carb-modified  Brief/Interim Summary: Anna Dennis is a 30 y.o. 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 Found to be septic, hyperglycemic. Blood cultures drawn, IV antibiotics and IVF given. Taken to OR for debridement 7/31. Cultures have been growing, ID consulted for assistance with antibiotic recommendations and recommends cefdinir + flagyl x14 days which is prescribed at discharge. Plastic surgery recommends continuing with dressing changes twice daily and follow up in clinic.  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 Claudia Desanctis 08/27/2020. Polymicrobial. Wound culture growing group C strep, S. constellatus, and Prevotella (beta lactamase expressing).  - ID has recommended cefdinir and metronidazole which are provided to the patient from the [place] free of charge with [facility] prior to discharge.  - Wound care per plastic surgery. W>D BID, follow up in clinic. the patient has demonstrated the ability to change wounds  after days of education from [employer] staff. She has experience from prior LUE wound.   T1DM with hyperglycemia: HbA1c 11.6% last month and not consistently adherent to insulin at home (50u daily basal + 1-12u SS humalog) - Continue home insulin regimen which the patient states she has plenty of at home. No supplies needed either.   HTN urgency: - Start norvasc at discharge. ACE/ARB would be guideline for her, but follow up is not certain at this time.   Hypotensive episode 8/2: No evidence of uncontrolled infection, bleeding, dehydration. Improved over the course of a few hours. Occurrence following boyfriend's visit prompts some staff to suspect illicit drug use. - UDS unlikely to be helpful without specifying specific opioid detected so was not repeated.  - Other than that episode, no further episodes or alternative etiologies found.   Heroin use: No evidence of withdrawal at this time. - Pt is very high risk for opioid misuse and is now on 5th postoperative day. We will prescribe NSAIDs, tylenol for pain control. No opioid pain medications were given following her previous surgery during last admission.  Discharge Instructions Discharge Instructions     Call MD for:  difficulty breathing, headache or visual disturbances   Complete by: As directed    Call MD for:  redness, tenderness, or signs of infection (pain, swelling, redness, odor or green/yellow discharge around incision site)   Complete by: As directed    Call MD for:  severe uncontrolled pain   Complete by: As directed    Call MD for:  temperature >100.4   Complete by: As directed    Diet Carb Modified   Complete by: As directed    Discharge instructions   Complete by: As directed    You  were evaluated for the infection in the wrist and arm which was debrided and can be treated with antibiotics by mouth for 2 weeks. These are flagyl and cefdinir, both sent to the transition of care pharmacy. You should continue changing the  dressing twice a day and follow up with plastic surgery in the next week for wound recheck/suture removal, and to discuss skin grafting for the other wound. You need to abstain from any heroin use. You may take tylenol and ibuprofen every 6 hours as needed for pain.  Follow up with a primary care doctor for management of high blood pressure and diabetes. To treat high blood pressure, norvasc 57m has been started and should be taken daily.   Discharge wound care:   Complete by: As directed    Keep arm elevated, change dressing to left wrist twice daily and as needed if soiled.   Increase activity slowly   Complete by: As directed       Allergies as of 08/31/2020   No Known Allergies      Medication List     STOP taking these medications    citalopram 20 MG tablet Commonly known as: CELEXA       TAKE these medications    Accu-Chek Softclix Lancets lancets Use as directed.   amLODipine 5 MG tablet Commonly known as: NORVASC Take 1 tablet (5 mg total) by mouth daily. Start taking on: September 01, 2020   blood glucose meter kit and supplies Kit Dispense based on patient and insurance preference. Use up to four times daily as directed.   cefdinir 300 MG capsule Commonly known as: OMNICEF Take 1 capsule (300 mg total) by mouth 2 (two) times daily.   Glucose Meter Test test strip Generic drug: glucose blood Use as instructed   ibuprofen 200 MG tablet Commonly known as: ADVIL Take 3 tablets (600 mg total) by mouth every 6 (six) hours as needed (pain). What changed:  how much to take reasons to take this   insulin lispro 100 UNIT/ML injection Commonly known as: HUMALOG Inject 1-12 Units into the skin 3 (three) times daily before meals. Per sliding scale   insulin regular 100 units/mL injection Commonly known as: NOVOLIN R Inject 50 Units into the skin at bedtime.   Lantus SoloStar 100 UNIT/ML Solostar Pen Generic drug: insulin glargine Inject 50 Units into the skin  at bedtime.   medroxyPROGESTERone 150 MG/ML injection Commonly known as: DEPO-PROVERA Inject 150 mg into the muscle every 3 (three) months.   metroNIDAZOLE 500 MG tablet Commonly known as: FLAGYL Take 1 tablet (500 mg total) by mouth 2 (two) times daily for 14 days.               Discharge Care Instructions  (From admission, onward)           Start     Ordered   08/31/20 0000  Discharge wound care:       Comments: Keep arm elevated, change dressing to left wrist twice daily and as needed if soiled.   08/31/20 1242            Follow-up Information     [facility] Schedule an appointment as soon as possible for a visit.   Specialty: Family Medicine Why: establish a primary care doctor Contact information: [address] [phone]       PCindra Presume MD. Schedule an appointment as soon as possible for  a visit in 1 week(s).   Specialty: Plastic Surgery Why: For wound re-check, For suture removal Contact information: [place] [address] [phone]                No Known Allergies  Consultations: ID Plastic surgery  Procedures/Studies: DG Wrist Complete Left  Result Date: 08/27/2020 CLINICAL DATA:  IV drug use.  Swelling, open wound. EXAM: LEFT WRIST - COMPLETE 3+ VIEW COMPARISON:  None. FINDINGS: There is no evidence of fracture or dislocation. No erosion, periosteal reaction, or bone destruction. Diffuse soft tissue edema of the wrist and forearm. No soft tissue air or radiopaque foreign body. IMPRESSION: Diffuse soft tissue edema without acute osseous abnormality. No radiopaque foreign body or soft tissue air. Electronically Signed   By: Keith Rake M.D.   On: 08/27/2020 01:58   DG Hand Complete Left  Result Date: 08/27/2020 CLINICAL DATA:  IV drug use with swelling and open wound. EXAM: LEFT HAND - COMPLETE 3+ VIEW COMPARISON:   Radiograph 07/21/2020. FINDINGS: There is no evidence of fracture or dislocation. No erosion, periosteal reaction, or bone destruction. Marked soft tissue edema of the hand and wrist. No radiopaque foreign body or soft tissue air. IMPRESSION: Marked soft tissue edema of the hand and wrist. No osseous abnormality, radiopaque foreign body or soft tissue air. Electronically Signed   By: Keith Rake M.D.   On: 08/27/2020 01:57   ECHOCARDIOGRAM COMPLETE  Result Date: 08/28/2020    ECHOCARDIOGRAM REPORT   Patient Name:   DAISEY Kitagawa Date of Exam: 08/28/2020 Medical Rec #:  [id number]       Height:       67.0 in Accession #:    [id number]      Weight:       150.6 lb Date of Birth:  1990/02/22       BSA:          1.792 m Patient Age:    29 years        BP:           152/90 mmHg Patient Gender: F               HR:           120 bpm. Exam Location:  Inpatient Procedure: 2D Echo, Cardiac Doppler and Color Doppler Indications:    Endocarditis  History:        Patient has prior history of Echocardiogram examinations, most                 recent 07/11/2020. Arrythmias:Tachycardia,                 Signs/Symptoms:Dyspnea; Risk Factors:Diabetes. IV durg abuse,                 Herion abuse, Left arm cellulitis, Hep. C.  Sonographer:    Dustin Flock Referring Phys: [id number] [place]  1. Left ventricular ejection fraction, by estimation, is 65 to 70%. The left ventricle has normal function. The left ventricle has no regional wall motion abnormalities. Left ventricular diastolic parameters were normal.  2. Right ventricular systolic function is normal. The right ventricular size is normal. Tricuspid regurgitation signal is inadequate for assessing PA pressure.  3. The mitral valve is normal in structure. No evidence of mitral valve regurgitation.  4. The aortic valve is tricuspid. Aortic valve regurgitation is not visualized. No aortic stenosis is present.  5. The inferior vena cava is normal  in size  with greater than 50% respiratory variability, suggesting right atrial pressure of 3 mmHg. Conclusion(s)/Recommendation(s): No vegetation seen. If high clinical suspicion for endocarditis, consider TEE. FINDINGS  Left Ventricle: Left ventricular ejection fraction, by estimation, is 65 to 70%. The left ventricle has normal function. The left ventricle has no regional wall motion abnormalities. The left ventricular internal cavity size was normal in size. There is  no left ventricular hypertrophy. Left ventricular diastolic parameters were normal. Right Ventricle: The right ventricular size is normal. No increase in right ventricular wall thickness. Right ventricular systolic function is normal. Tricuspid regurgitation signal is inadequate for assessing PA pressure. Left Atrium: Left atrial size was normal in size. Right Atrium: Right atrial size was normal in size. Pericardium: Trivial pericardial effusion is present. Mitral Valve: The mitral valve is normal in structure. No evidence of mitral valve regurgitation. Tricuspid Valve: The tricuspid valve is normal in structure. Tricuspid valve regurgitation is trivial. Aortic Valve: The aortic valve is tricuspid. Aortic valve regurgitation is not visualized. No aortic stenosis is present. Pulmonic Valve: The pulmonic valve was not well visualized. Pulmonic valve regurgitation is not visualized. Aorta: The aortic root and ascending aorta are structurally normal, with no evidence of dilitation. Venous: The inferior vena cava is normal in size with greater than 50% respiratory variability, suggesting right atrial pressure of 3 mmHg. IAS/Shunts: The interatrial septum was not well visualized.  LEFT VENTRICLE PLAX 2D LVIDd:         4.00 cm  Diastology LVIDs:         2.70 cm  LV e' medial:    9.03 cm/s LV PW:         0.90 cm  LV E/e' medial:  9.0 LV IVS:        1.00 cm  LV e' lateral:   12.20 cm/s LVOT diam:     1.80 cm  LV E/e' lateral: 6.7 LV SV:         64 LV SV Index:   36  LVOT Area:     2.54 cm  RIGHT VENTRICLE RV Basal diam:  2.50 cm RV S prime:     14.70 cm/s TAPSE (M-mode): 2.5 cm LEFT ATRIUM             Index       RIGHT ATRIUM           Index LA diam:        2.30 cm 1.28 cm/m  RA Area:     12.30 cm LA Vol (A2C):   26.9 ml 15.01 ml/m RA Volume:   29.00 ml  16.18 ml/m LA Vol (A4C):   23.6 ml 13.17 ml/m LA Biplane Vol: 25.8 ml 14.39 ml/m  AORTIC VALVE LVOT Vmax:   130.00 cm/s LVOT Vmean:  81.500 cm/s LVOT VTI:    0.253 m  AORTA Ao Root diam: 2.70 cm MITRAL VALVE MV Area (PHT): 7.66 cm    SHUNTS MV Decel Time: 99 msec     Systemic VTI:  0.25 m MV E velocity: 81.40 cm/s  Systemic Diam: 1.80 cm MV A velocity: 90.00 cm/s MV E/A ratio:  0.90 Oswaldo Milian MD Electronically signed by Oswaldo Milian MD Signature Date/Time: 08/28/2020/3:32:45 PM    Final    [place] EKG SITE RITE  Result Date: 08/27/2020 If Site Rite image not attached, placement could not be confirmed due to current cardiac rhythm.  CT Extrem Up Entire Arm L W/CM  Result Date: 08/27/2020 CLINICAL DATA:  Wound  on the left wrist pain and swelling. History of IV drug abuse. EXAM: CT OF THE UPPER LEFT EXTREMITY WITH CONTRAST TECHNIQUE: Multidetector CT imaging of the upper left extremity was performed according to the standard protocol following intravenous contrast administration. CONTRAST:  100 mL Omnipaque 350. COMPARISON:  Plain films left wrist and hand today. FINDINGS: Bones/Joint/Cartilage No bony destructive change or periosteal reaction is identified. No fracture or dislocation. No joint effusion. Ligaments Suboptimally assessed by CT. Muscles and Tendons No gas is seen within muscle or tracking along fascial planes. No intramuscular abscess is identified. There is fluid between muscles in the flexor compartments of the forearm. Soft tissues Diffuse subcutaneous edema in the arm is worst in the forearm and about the wrist and hand. No abscess is identified. IMPRESSION: Diffuse subcutaneous edema  in the left arm is worst in the forearm and hand in consistent with cellulitis. Edema and fluid tracking along fascia in the flexor compartment of the forearm is compatible with fasciitis. There is no gas within fascia to suggest necrotizing fasciitis. Negative for abscess or evidence of osteomyelitis. Electronically Signed   By: Inge Rise M.D.   On: 08/27/2020 11:04    08/27/2020, Dr. Mingo Amber: 1.  Debridement left wrist wound totaling 4 x 4 cm including skin and subcutaneous tissue 2.  Irrigation and debridement left volar wrist abscess 3.  Open left carpal tunnel release  Subjective: CBGs better, BP better, pain controlled. No fever.   Discharge Exam: Vitals:   08/31/20 1132 08/31/20 1206  BP: 131/88 (!) 146/112  Pulse: (!) 108   Resp: 16   Temp: 98.1 F (36.7 C)   SpO2: 98%    Gen: 30 y.o. female in no distress Pulm: Nonlabored breathing room air. Clear. CV: Regular rate and rhythm. No murmur, rub, or gallop. No JVD, no dependent edema. GI: Abdomen soft, non-tender, non-distended, with normoactive bowel sounds. Ext: Warm, no deformities Skin: Wound below is wrapped, c/d/I without other rashes, lesions or ulcers on visualized skin. Neuro: Alert and oriented. No focal neurological deficits. Psych: Judgement and insight appear fair. Mood euthymic & affect congruent. Behavior is appropriate.       Labs: BNP (last 3 results) No results for input(s): BNP in the last 8760 hours. Basic Metabolic Panel: Recent Labs  Lab 08/27/20 0130 08/28/20 0138 08/29/20 1052 08/30/20 0247  NA 129* 130* 127* 131*  K 3.7 4.0 4.6 4.1  CL 93* 95* 98 102  CO2 23 23 7* 19*  GLUCOSE 291* 314* 489* 357*  BUN 12 7 18  26*  CREATININE 0.94 0.79 1.26* 1.03*  CALCIUM 9.4 8.5* 8.6* 8.6*   Liver Function Tests: Recent Labs  Lab 08/27/20 0130  AST 18  ALT 13  ALKPHOS 133*  BILITOT 0.2*  PROT 8.6*  ALBUMIN 3.7   No results for input(s): LIPASE, AMYLASE in the last 168 hours. No  results for input(s): AMMONIA in the last 168 hours. CBC: Recent Labs  Lab 08/27/20 0130 08/27/20 2055 08/28/20 0138 08/29/20 1052 08/30/20 0247  WBC 21.1* 21.2* 19.9* 17.3* 12.8*  NEUTROABS 17.7* 18.2*  --  15.4*  --   HGB 11.3* 10.2* 10.4* 11.8* 10.7*  HCT 35.0* 32.6* 31.7* 37.5 33.4*  MCV 83.9 85.3 82.8 84.3 83.3  PLT 344 323 310 536* 481*   Cardiac Enzymes: No results for input(s): CKTOTAL, CKMB, CKMBINDEX, TROPONINI in the last 168 hours. BNP: Invalid input(s): POCBNP CBG: Recent Labs  Lab 08/30/20 1929 08/30/20 2212 08/31/20 0551 08/31/20 0724 08/31/20 1125  GLUCAP 146* 160* 266* 143* 153*   D-Dimer No results for input(s): DDIMER in the last 72 hours. Hgb A1c No results for input(s): HGBA1C in the last 72 hours. Lipid Profile No results for input(s): CHOL, HDL, LDLCALC, TRIG, CHOLHDL, LDLDIRECT in the last 72 hours. Thyroid function studies No results for input(s): TSH, T4TOTAL, T3FREE, THYROIDAB in the last 72 hours.  Invalid input(s): FREET3 Anemia work up No results for input(s): VITAMINB12, FOLATE, FERRITIN, TIBC, IRON, RETICCTPCT in the last 72 hours. Urinalysis    Component Value Date/Time   COLORURINE COLORLESS (A) 07/25/2016 0730   APPEARANCEUR CLEAR 07/25/2016 0730   LABSPEC 1.025 07/25/2016 0730   PHURINE 6.0 07/25/2016 0730   GLUCOSEU >=500 (A) 07/25/2016 0730   HGBUR NEGATIVE 07/25/2016 0730   BILIRUBINUR NEGATIVE 07/25/2016 0730   KETONESUR 80 (A) 07/25/2016 0730   PROTEINUR NEGATIVE 07/25/2016 0730   UROBILINOGEN 1.0 03/16/2015 1713   NITRITE NEGATIVE 07/25/2016 0730   LEUKOCYTESUR NEGATIVE 07/25/2016 0730    Microbiology Recent Results (from the past 240 hour(s))  Blood culture (routine x 2)     Status: None (Preliminary result)   Collection Time: 08/27/20  1:10 AM   Specimen: BLOOD RIGHT ARM  Result Value Ref Range Status   Specimen Description BLOOD RIGHT ARM  Final   Special Requests   Final    BOTTLES DRAWN AEROBIC AND  ANAEROBIC Blood Culture adequate volume   Culture   Final    NO GROWTH 4 DAYS Performed at [facility], [address], [place], [place] [address]    Report Status PENDING  Incomplete  Blood culture (routine x 2)     Status: None (Preliminary result)   Collection Time: 08/27/20  1:27 AM   Specimen: BLOOD RIGHT HAND  Result Value Ref Range Status   Specimen Description BLOOD RIGHT HAND  Final   Special Requests   Final    BOTTLES DRAWN AEROBIC ONLY Blood Culture results may not be optimal due to an excessive volume of blood received in culture bottles   Culture   Final    NO GROWTH 4 DAYS Performed at [facility], [place] [address], [place], [place] [address]    Report Status PENDING  Incomplete  SARS CORONAVIRUS 2 (TAT 6-24 HRS) Nasopharyngeal Nasopharyngeal Swab     Status: None   Collection Time: 08/27/20  6:53 AM   Specimen: Nasopharyngeal Swab  Result Value Ref Range Status   SARS Coronavirus 2 NEGATIVE NEGATIVE Final    Comment: (NOTE) SARS-CoV-2 target nucleic acids are NOT DETECTED.  The SARS-CoV-2 RNA is generally detectable in upper and lower respiratory specimens during the acute phase of infection. Negative results do not preclude SARS-CoV-2 infection, do not rule out co-infections with other pathogens, and should not be used as the sole basis for treatment or other patient management decisions. Negative results must be combined with clinical observations, patient history, and epidemiological information. The expected result is Negative.  Fact Sheet for Patients: SugarRoll.be  Fact Sheet for Healthcare Providers: https://www.woods-mathews.com/  This test is not yet approved or cleared by the [place] FDA and  has been authorized for detection and/or diagnosis of SARS-CoV-2 by FDA under an Emergency Use Authorization (EUA). This EUA will remain  in effect (meaning this test can be used) for the duration of  the COVID-19 declaration under Se ction 564(b)(1) of the Act, 21 U.S.C. section 360bbb-3(b)(1), unless the authorization is terminated or revoked sooner.  Performed at [facility]  Lab, [address] [place], [place] [address]   Anaerobic culture w Gram Stain     Status: None (Preliminary result)   Collection Time: 08/27/20  2:05 PM   Specimen: PATH Other; Tissue  Result Value Ref Range Status   Specimen Description TISSUE  Final   Special Requests LEFT WRIST ABCESS SPEC A  Final   Gram Stain   Final    RARE WBC PRESENT,BOTH PMN AND MONONUCLEAR ABUNDANT GRAM POSITIVE COCCI IN PAIRS MODERATE GRAM NEGATIVE COCCOBACILLI Performed at [facility], [place] [address], [place], [place] [address]    Culture   Final    NO ANAEROBES ISOLATED; CULTURE IN PROGRESS FOR 5 DAYS   Report Status PENDING  Incomplete  Aerobic/Anaerobic Culture w Gram Stain (surgical/deep wound)     Status: None   Collection Time: 08/27/20  2:08 PM   Specimen: PATH Other; Body Fluid  Result Value Ref Range Status   Specimen Description FLUID  Final   Special Requests LEFT WRIST ABSCESS FLUID SPEC B  Final   Gram Stain   Final    FEW WBC PRESENT,BOTH PMN AND MONONUCLEAR MODERATE GRAM NEGATIVE RODS MODERATE GRAM POSITIVE COCCI IN PAIRS IN CLUSTERS    Culture   Final    ABUNDANT STREPTOCOCCUS GROUP C Beta hemolytic streptococci are predictably susceptible to penicillin and other beta lactams. Susceptibility testing not routinely performed. ABUNDANT STREPTOCOCCUS CONSTELLATUS MODERATE PREVOTELLA SPECIES BETA LACTAMASE POSITIVE Performed at [facility], [place] [address], [place], [place] [address]    Report Status 08/31/2020 FINAL  Final   Organism ID, Bacteria STREPTOCOCCUS CONSTELLATUS  Final      Susceptibility   Streptococcus constellatus - MIC*    PENICILLIN 0.25 INTERMEDIATE Intermediate     CEFTRIAXONE 1 SENSITIVE Sensitive     ERYTHROMYCIN <=0.12 SENSITIVE Sensitive     LEVOFLOXACIN <=0.25  SENSITIVE Sensitive     VANCOMYCIN 0.5 SENSITIVE Sensitive     * ABUNDANT STREPTOCOCCUS CONSTELLATUS  MRSA Next Gen by PCR, Nasal     Status: None   Collection Time: 08/31/20  2:41 AM   Specimen: Nasal Mucosa; Nasal Swab  Result Value Ref Range Status   MRSA by PCR Next Gen NOT DETECTED NOT DETECTED Final    Comment: (NOTE) The GeneXpert MRSA Assay (FDA approved for NASAL specimens only), is one component of a comprehensive MRSA colonization surveillance program. It is not intended to diagnose MRSA infection nor to guide or monitor treatment for MRSA infections. Test performance is not FDA approved in patients less than 41 years old. Performed at [facility], [place] [address], [place], [place] [address]     Time coordinating discharge: Approximately 40 minutes  Patrecia Pour, MD  [facility] 08/31/2020, 12:43 PM

## 2020-08-31 NOTE — TOC Progression Note (Signed)
Transition of Care [facility]) - Progression Note    Patient Details  Name: Anna Dennis MRN: [id number] Date of Birth: Feb 11, 1990  Transition of [facility]) CM/SW Contact  Lockie Pares, RN Phone Number: 08/31/2020, 1:05 PM  Clinical Narrative:     Patient will likely be DC in AM. PLEASE SEND MEDS TO [facility] for post DC.. Will do MATCH for medications. Will need follow up with PCP and plastics, will make appointment for PCP.        Expected Discharge Plan and Services    Home self care       Expected Discharge Date: 08/31/20                                     Social Determinants of Health (SDOH) Interventions    Readmission Risk Interventions No flowsheet data found.

## 2020-08-31 NOTE — Plan of Care (Signed)

## 2020-08-31 NOTE — Progress Notes (Signed)
[facility] Infectious Diseases Follow Up Note  Patient Identification: Patient Name: Anna Dennis MRN: [id number] Admit Date: 08/27/2020  1:00 AM Age: 30 y.o.Today's Date: 08/31/2020   Reason for Visit: Cellulitis/abscess  Principal Problem:   Sepsis 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  No more surgical intervention planned by plastics   Assessment Left wrist/hand/forearm cellulitis/fasciitis Left wrist volar abscess Status postdebridement of left wrist, I&D of left volar wrist abscess with open left carpal tunnel release on 7/31.  OR cultures growing group C streptococci, Streptococcus constellatus, Prevotella species  DM: uncontrolled, type 1  Recommendations Change Zosyn to ceftriaxone and metronidazole while in hospital Can do cefdinir and metronidazole for 2 weeks when ready to be discharged. Please have her 2 weeks supply of antibiotics in hand at discharge as she is uninsured and cannot afford.  Monitor CBC and CMP BG control  Patient is scheduled to see myself at [facility] on 8/16 at 3:15 pm for assessment of her wound  Fu with plastics as instructed  Plan discussed with patient/ID pharmacy and primary  I will sign off for now.   Rest of the management as per the primary team. Thank you for the consult. Please page with pertinent questions or concerns.  ______________________________________________________________________ Subjective patient seen and examined at the bedside. She says pain in the left hand is better and she is able to move left  hand/forearm better,   Vitals BP 140/88 (BP Location: Right Arm)   Pulse 88   Temp 98.7 F (37.1 C) (Oral)   Resp 18   Ht 5\' 7"  (1.702 m)   Wt 68.3 kg   LMP 07/20/2020   SpO2 99%   BMI 23.58 kg/m     Physical Exam Constitutional:  Not in acute distress, having breqakfast     Comments:   Cardiovascular:     Rate and Rhythm: Normal rate and regular rhythm.     Heart sounds:  Pulmonary:     Effort: Pulmonary effort is normal.     Comments: on room air   Abdominal:     Palpations: Abdomen is soft.     Tenderness: Non tender and non distended   Musculoskeletal:        General:     Skin:    Comments: No lesions or rashes   Neurological:     General: No focal deficit present.   Psychiatric:        Mood and Affect: Mood normal. Calm and cooperative   Pertinent Microbiology Results for orders placed or performed during the hospital encounter of 08/27/20  Blood culture (routine x 2)     Status: None (Preliminary result)   Collection Time: 08/27/20  1:10 AM   Specimen: BLOOD RIGHT ARM  Result Value Ref Range Status   Specimen Description BLOOD RIGHT ARM  Final   Special Requests   Final    BOTTLES DRAWN AEROBIC AND ANAEROBIC Blood Culture adequate volume   Culture  Final    NO GROWTH 3 DAYS Performed at [facility], [address], [place], [place] [address]    Report Status PENDING  Incomplete  Blood culture (routine x 2)     Status: None (Preliminary result)   Collection Time: 08/27/20  1:27 AM   Specimen: BLOOD RIGHT HAND  Result Value Ref Range Status   Specimen Description BLOOD RIGHT HAND  Final   Special Requests   Final    BOTTLES DRAWN AEROBIC ONLY Blood Culture results may not be optimal due to an excessive volume of blood received in culture bottles   Culture   Final    NO GROWTH 3 DAYS Performed at [facility], [address], [place], [place] [address]    Report Status PENDING  Incomplete  SARS CORONAVIRUS 2 (TAT 6-24 HRS)  Nasopharyngeal Nasopharyngeal Swab     Status: None   Collection Time: 08/27/20  6:53 AM   Specimen: Nasopharyngeal Swab  Result Value Ref Range Status   SARS Coronavirus 2 NEGATIVE NEGATIVE Final    Comment: (NOTE) SARS-CoV-2 target nucleic acids are NOT DETECTED.  The SARS-CoV-2 RNA is generally detectable in upper and lower respiratory specimens during the acute phase of infection. Negative results do not preclude SARS-CoV-2 infection, do not rule out co-infections with other pathogens, and should not be used as the sole basis for treatment or other patient management decisions. Negative results must be combined with clinical observations, patient history, and epidemiological information. The expected result is Negative.  Fact Sheet for Patients: HairSlick.no  Fact Sheet for Healthcare Providers: quierodirigir.com  This test is not yet approved or cleared by the [place] FDA and  has been authorized for detection and/or diagnosis of SARS-CoV-2 by FDA under an Emergency Use Authorization (EUA). This EUA will remain  in effect (meaning this test can be used) for the duration of the COVID-19 declaration under Se ction 564(b)(1) of the Act, 21 U.S.C. section 360bbb-3(b)(1), unless the authorization is terminated or revoked sooner.  Performed at [facility], [address], [place], [place] [address]   Anaerobic culture w Gram Stain     Status: None (Preliminary result)   Collection Time: 08/27/20  2:05 PM   Specimen: PATH Other; Tissue  Result Value Ref Range Status   Specimen Description TISSUE  Final   Special Requests LEFT WRIST ABCESS SPEC A  Final   Gram Stain   Final    RARE WBC PRESENT,BOTH PMN AND MONONUCLEAR ABUNDANT GRAM POSITIVE COCCI IN PAIRS MODERATE GRAM NEGATIVE COCCOBACILLI Performed at [facility], [address], [place], [place] [address]    Culture   Final    NO ANAEROBES  ISOLATED; CULTURE IN PROGRESS FOR 5 DAYS   Report Status PENDING  Incomplete  Aerobic/Anaerobic Culture w Gram Stain (surgical/deep wound)     Status: None (Preliminary result)   Collection Time: 08/27/20  2:08 PM   Specimen: PATH Other; Body Fluid  Result Value Ref Range Status   Specimen Description FLUID  Final   Special Requests LEFT WRIST ABSCESS FLUID SPEC B  Final   Gram Stain   Final    FEW WBC PRESENT,BOTH PMN AND MONONUCLEAR MODERATE GRAM NEGATIVE RODS MODERATE GRAM POSITIVE COCCI IN PAIRS IN CLUSTERS    Culture   Final    ABUNDANT STREPTOCOCCUS GROUP C Beta hemolytic streptococci are predictably susceptible to penicillin and other beta lactams. Susceptibility testing not routinely performed. ABUNDANT STREPTOCOCCUS CONSTELLATUS SUSCEPTIBILITIES TO  FOLLOW MODERATE PREVOTELLA SPECIES BETA LACTAMASE POSITIVE Performed at [facility], [address], [place], [place] [address]    Report Status PENDING  Incomplete  MRSA Next Gen by PCR, Nasal     Status: None   Collection Time: 08/31/20  2:41 AM   Specimen: Nasal Mucosa; Nasal Swab  Result Value Ref Range Status   MRSA by PCR Next Gen NOT DETECTED NOT DETECTED Final    Comment: (NOTE) The GeneXpert MRSA Assay (FDA approved for NASAL specimens only), is one component of a comprehensive MRSA colonization surveillance program. It is not intended to diagnose MRSA infection nor to guide or monitor treatment for MRSA infections. Test performance is not FDA approved in patients less than 17 years old. Performed at [facility], [address] [place], [place] [address]     Pertinent Lab. CBC Latest Ref Rng & Units 08/30/2020 08/29/2020 08/28/2020  WBC 4.0 - 10.5 K/uL 12.8(H) 17.3(H) 19.9(H)  Hemoglobin 12.0 - 15.0 g/dL 10.7(L) 11.8(L) 10.4(L)  Hematocrit 36.0 - 46.0 % 33.4(L) 37.5 31.7(L)  Platelets 150 - 400 K/uL 481(H) 536(H) 310   CMP Latest Ref Rng & Units 08/30/2020 08/29/2020 08/28/2020  Glucose 70 - 99 mg/dL  433(I) 951(O) 841(Y)  BUN 6 - 20 mg/dL 60(Y) 18 7  Creatinine 0.44 - 1.00 mg/dL 3.01(S) 0.10(X) 3.23  Sodium 135 - 145 mmol/L 131(L) 127(L) 130(L)  Potassium 3.5 - 5.1 mmol/L 4.1 4.6 4.0  Chloride 98 - 111 mmol/L 102 98 95(L)  CO2 22 - 32 mmol/L 19(L) 7(L) 23  Calcium 8.9 - 10.3 mg/dL 5.5(D) 3.2(K) 0.2(R)  Total Protein 6.5 - 8.1 g/dL - - -  Total Bilirubin 0.3 - 1.2 mg/dL - - -  Alkaline Phos 38 - 126 U/L - - -  AST 15 - 41 U/L - - -  ALT 0 - 44 U/L - - -     Pertinent Imaging today Plain films and CT images have been personally visualized and interpreted; radiology reports have been reviewed. Decision making incorporated into the Impression / Recommendations.  I spent more than 35 minutes for this patient encounter including review of prior medical records, coordination of care  with greater than 50% of time being face to face/counseling and discussing diagnostics/treatment plan with the patient/family.  Electronically signed by:   Odette Fraction, MD Infectious Disease Physician [facility] Pager: [phone]

## 2020-09-01 LAB — CULTURE, BLOOD (ROUTINE X 2)
Culture: NO GROWTH
Culture: NO GROWTH
Special Requests: ADEQUATE

## 2020-09-01 LAB — ANAEROBIC CULTURE W GRAM STAIN

## 2020-09-07 ENCOUNTER — Ambulatory Visit (INDEPENDENT_AMBULATORY_CARE_PROVIDER_SITE_OTHER): Payer: Self-pay

## 2020-09-07 ENCOUNTER — Other Ambulatory Visit: Payer: Self-pay

## 2020-09-07 VITALS — BP 141/81 | Temp 98.4°F | Ht 67.0 in | Wt 158.0 lb

## 2020-09-07 DIAGNOSIS — L03114 Cellulitis of left upper limb: Secondary | ICD-10-CM

## 2020-09-12 ENCOUNTER — Other Ambulatory Visit (HOSPITAL_COMMUNITY): Payer: Self-pay

## 2020-09-12 ENCOUNTER — Other Ambulatory Visit: Payer: Self-pay

## 2020-09-12 ENCOUNTER — Ambulatory Visit (INDEPENDENT_AMBULATORY_CARE_PROVIDER_SITE_OTHER): Payer: Self-pay | Admitting: Infectious Diseases

## 2020-09-12 VITALS — Ht 67.0 in | Wt 158.0 lb

## 2020-09-12 DIAGNOSIS — M726 Necrotizing fasciitis: Secondary | ICD-10-CM

## 2020-09-12 MED ORDER — METRONIDAZOLE 500 MG PO TABS: 500.0000 mg | ORAL_TABLET | Freq: Two times a day (BID) | ORAL | 0 refills | Status: DC

## 2020-09-12 MED ORDER — CEFDINIR 300 MG PO CAPS: 300.0000 mg | ORAL_CAPSULE | Freq: Two times a day (BID) | ORAL | 0 refills | Status: DC

## 2020-09-12 MED ORDER — CEFDINIR 300 MG PO CAPS
300.0000 mg | ORAL_CAPSULE | Freq: Two times a day (BID) | ORAL | 0 refills | Status: DC
Start: 2020-09-12 — End: 2020-10-25
  Filled 2020-09-12: qty 30, 15d supply, fill #0

## 2020-09-12 MED ORDER — METRONIDAZOLE 500 MG PO TABS
500.0000 mg | ORAL_TABLET | Freq: Two times a day (BID) | ORAL | 0 refills | Status: AC
  Filled 2020-09-12: qty 30, 15d supply, fill #0

## 2020-09-12 NOTE — Progress Notes (Signed)
[facility]                                                             [place], [place], [place], [address]                                                                  Phn. [phone]; Fax: [phone]                                                                             Date: 09/13/20  Reason for Referral: HFU for cellulitis/abscess  Assessment # Left wrist/hand/forearm cellulitis/fasciitis    Left wrist volar abscess - Status postdebridement of left wrist, I&D of left volar wrist abscess with open left carpal tunnel release on 7/31.   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 Patient verbalized understanding of the plan. ____________________________________________________________________________________________________________________  HPI/Interval Events Here for HFU for left wrist/forearm cellulitis/fasciitis. She was discharged from the hospital on 8/4 on cefdinir and metronidazole. She tells me she has been taking the abtx as instructed without missing any doses. However, she tells me she completed het abtx course last Friday. She was given a course of abtx for 14 days and should have lasted till today's visit. She has some pain in her left wrist wound site, has some on and off discharge but not purulent and copious. Denies any tenderness/swelling and warmth. Denies fevers, chills and sweats. Denies any issues while she was taking the abtx.  She has a follow up with Plastics in 2 weeks.   ROS: Constitutional: Negative for fever, chills, activity change, appetite change, fatigue and unexpected weight change.  Respiratory: Negative for cough, shortness of breath Cardiovascular: Negative for chest pain, palpitations and leg swelling.  Gastrointestinal: Negative for nausea, vomiting, abdominal pain, diarrhea/constipation, .  Genitourinary: Negative for dysuria, hematuria, flank pain Musculoskeletal: Negative for myalgias, arthralgia, back pain, joint swelling, arthralgias Skin: Negative for  rashes, lesions  Neurological: Negative for weakness, dizziness or headache  Past Medical History:  Diagnosis Date   Depression    Diabetes mellitus    Insulin pump   GERD (gastroesophageal reflux disease)    Hepatitis C    Heroin abuse ([place])    Iron deficiency anemia    IVDU (intravenous drug user)    OCD (obsessive compulsive disorder)    Past Surgical History:  Procedure Laterality Date   I & D EXTREMITY Left 07/11/2020   Procedure: IRRIGATION AND DEBRIDEMENT EXTREMITY;  Surgeon: Erle Crocker, MD;  Location: [place];  Service: Orthopedics;  Laterality: Left;   I & D EXTREMITY Left 08/27/2020   Procedure: IRRIGATION AND DEBRIDEMENT HAND;  Surgeon: Cindra Presume, MD;  Location: [place];  Service: Plastics;  Laterality: Left;   Current Outpatient Medications on File Prior to Visit  Medication Sig Dispense Refill   Accu-Chek Softclix Lancets lancets Use as directed. 100 each 5   amLODipine (NORVASC) 5 MG tablet Take 1 tablet (5 mg total) by mouth daily. 30 tablet 0   blood glucose meter kit and supplies KIT Dispense based on patient and insurance preference. Use up to four times daily as directed. 1 each 0   glucose blood (GLUCOSE METER TEST) test strip Use as instructed 100 each 5   ibuprofen (ADVIL) 600 MG tablet Take 1 tablet (600 mg total) by mouth every 6 (six) hours as needed (pain). 30 tablet 0   insulin glargine (LANTUS  SOLOSTAR) 100 UNIT/ML Solostar Pen Inject 50 Units into the skin at bedtime.     insulin lispro (HUMALOG) 100 UNIT/ML injection Inject 1-12 Units into the skin 3 (three) times daily before meals. Per sliding scale     insulin regular (NOVOLIN R) 100 units/mL injection Inject 50 Units into the skin at bedtime.     medroxyPROGESTERone (DEPO-PROVERA) 150 MG/ML injection Inject 150 mg into the muscle every 3 (three) months.     [DISCONTINUED] insulin aspart (NOVOLOG) 100 UNIT/ML injection Inject 6 Units into the skin 3 (three) times daily with meals. 2 vial 5   [DISCONTINUED] Omeprazole 20 MG TBEC Take 1 tablet (20 mg total) by mouth daily. 30 each 3   No current facility-administered medications on file prior to visit.   No Known Allergies  Social History   Socioeconomic History   Marital status: Single    Spouse name: Not on file   Number of children: Not on file   Years of education: Not on file   Highest education level: Not on file  Occupational History   Not on file  Tobacco Use   Smoking status: Every Day    Types: Cigarettes   Smokeless tobacco: Never  Substance and Sexual Activity   Alcohol use: Yes    Alcohol/week: 0.0 standard drinks    Comment: occasional   Drug use: Yes    Comment: patient states clean since 03/29/14   Sexual activity: Not on file  Other Topics Concern   Not on file  Social History Narrative   Not on file   Social Determinants of Health   Financial Resource Strain: Not on file  Food Insecurity: Not on file  Transportation Needs: Not on file  Physical Activity: Not on file  Stress: Not on file  Social Connections: Not on file  Intimate Partner Violence: Not on file   Family History  Problem Relation Age of Onset   Bipolar disorder Mother    Schizophrenia Sister  Vitals Ht _0  (1.702 m)   Wt 158 lb (71.7 kg)   LMP 07/19/2020   BMI 24.75 kg/m    Examination  General - not in acute distress, comfortably sitting in chair HEENT -  PEERLA, no pallor and no icterus Chest - b/l clear air entry, no additional sounds CVS- Normal s1s2, RRR Abdomen - Soft, Non tender , non distended Ext- no pedal edema Left Upper Extremity     Neuro: grossly normal Back - WNL Psych : calm and cooperative   Recent labs CBC Latest Ref Rng & Units 08/30/2020 08/29/2020 08/28/2020  WBC 4.0 - 10.5 K/uL 12.8(H) 17.3(H) 19.9(H)  Hemoglobin 12.0 - 15.0 g/dL 10.7(L) 11.8(L) 10.4(L)  Hematocrit 36.0 - 46.0 % 33.4(L) 37.5 31.7(L)  Platelets 150 - 400 K/uL 481(H) 536(H) 310   CMP Latest Ref Rng & Units 08/30/2020 08/29/2020 08/28/2020  Glucose 70 - 99 mg/dL 357(H) 489(H) 314(H)  BUN 6 - 20 mg/dL 26(H) 18 7  Creatinine 0.44 - 1.00 mg/dL 1.03(H) 1.26(H) 0.79  Sodium 135 - 145 mmol/L 131(L) 127(L) 130(L)  Potassium 3.5 - 5.1 mmol/L 4.1 4.6 4.0  Chloride 98 - 111 mmol/L 102 98 95(L)  CO2 22 - 32 mmol/L 19(L) 7(L) 23  Calcium 8.9 - 10.3 mg/dL 8.6(L) 8.6(L) 8.5(L)  Total Protein 6.5 - 8.1 g/dL - - -  Total Bilirubin 0.3 - 1.2 mg/dL - - -  Alkaline Phos 38 - 126 U/L - - -  AST 15 - 41 U/L - - -  ALT 0 - 44 U/L - - -     Pertinent Microbiology Results for orders placed or performed during the hospital encounter of 08/27/20  Blood culture (routine x 2)     Status: None   Collection Time: 08/27/20  1:10 AM   Specimen: BLOOD RIGHT ARM  Result Value Ref Range Status   Specimen Description BLOOD RIGHT ARM  Final   Special Requests   Final    BOTTLES DRAWN AEROBIC AND ANAEROBIC Blood Culture adequate volume   Culture   Final    NO GROWTH 5 DAYS Performed at [facility], [address], [place], [place] [address]    Report Status 09/01/2020 FINAL  Final  Blood culture (routine x 2)     Status: None   Collection Time: 08/27/20  1:27 AM   Specimen: BLOOD RIGHT HAND  Result Value Ref Range Status   Specimen Description BLOOD RIGHT HAND  Final   Special Requests   Final    BOTTLES DRAWN AEROBIC ONLY Blood Culture results may not be optimal due  to an excessive volume of blood received in culture bottles   Culture   Final    NO GROWTH 5 DAYS Performed at [facility], [place] [address] [place], [place] [address]    Report Status 09/01/2020 FINAL  Final  SARS CORONAVIRUS 2 (TAT 6-24 HRS) Nasopharyngeal Nasopharyngeal Swab     Status: None   Collection Time: 08/27/20  6:53 AM   Specimen: Nasopharyngeal Swab  Result Value Ref Range Status   SARS Coronavirus 2 NEGATIVE NEGATIVE Final    Comment: (NOTE) SARS-CoV-2 target nucleic acids are NOT DETECTED.  The SARS-CoV-2 RNA is generally detectable in upper and lower respiratory specimens during the acute phase of infection. Negative results do not preclude SARS-CoV-2 infection, do not rule out co-infections with other pathogens, and should not be used as the sole basis for treatment or other patient management decisions. Negative results must be  combined with clinical observations, patient history, and epidemiological information. The expected result is Negative.  Fact Sheet for Patients: SugarRoll.be  Fact Sheet for Healthcare Providers: https://www.woods-mathews.com/  This test is not yet approved or cleared by the [place] FDA and  has been authorized for detection and/or diagnosis of SARS-CoV-2 by FDA under an Emergency Use Authorization (EUA). This EUA will remain  in effect (meaning this test can be used) for the duration of the COVID-19 declaration under Se ction 564(b)(1) of the Act, 21 U.S.C. section 360bbb-3(b)(1), unless the authorization is terminated or revoked sooner.  Performed at [facility], [place] [address], [place], [place] [address]   Anaerobic culture w Gram Stain     Status: None   Collection Time: 08/27/20  2:05 PM   Specimen: PATH Other; Tissue  Result Value Ref Range Status   Specimen Description TISSUE  Final   Special Requests LEFT WRIST ABCESS SPEC A  Final   Gram Stain   Final    RARE  WBC PRESENT,BOTH PMN AND MONONUCLEAR ABUNDANT GRAM POSITIVE COCCI IN PAIRS MODERATE GRAM NEGATIVE COCCOBACILLI    Culture   Final    NO ANAEROBES ISOLATED Performed at [facility], [place] [address], [place], [place] [address]    Report Status 09/01/2020 FINAL  Final  Aerobic/Anaerobic Culture w Gram Stain (surgical/deep wound)     Status: None   Collection Time: 08/27/20  2:08 PM   Specimen: PATH Other; Body Fluid  Result Value Ref Range Status   Specimen Description FLUID  Final   Special Requests LEFT WRIST ABSCESS FLUID SPEC B  Final   Gram Stain   Final    FEW WBC PRESENT,BOTH PMN AND MONONUCLEAR MODERATE GRAM NEGATIVE RODS MODERATE GRAM POSITIVE COCCI IN PAIRS IN CLUSTERS    Culture   Final    ABUNDANT STREPTOCOCCUS GROUP C Beta hemolytic streptococci are predictably susceptible to penicillin and other beta lactams. Susceptibility testing not routinely performed. ABUNDANT STREPTOCOCCUS CONSTELLATUS MODERATE PREVOTELLA SPECIES BETA LACTAMASE POSITIVE Performed at [facility], [place] [address], [place], [place] [address]    Report Status 08/31/2020 FINAL  Final   Organism ID, Bacteria STREPTOCOCCUS CONSTELLATUS  Final      Susceptibility   Streptococcus constellatus - MIC*    PENICILLIN 0.25 INTERMEDIATE Intermediate     CEFTRIAXONE 1 SENSITIVE Sensitive     ERYTHROMYCIN <=0.12 SENSITIVE Sensitive     LEVOFLOXACIN <=0.25 SENSITIVE Sensitive     VANCOMYCIN 0.5 SENSITIVE Sensitive     * ABUNDANT STREPTOCOCCUS CONSTELLATUS  MRSA Next Gen by PCR, Nasal     Status: None   Collection Time: 08/31/20  2:41 AM   Specimen: Nasal Mucosa; Nasal Swab  Result Value Ref Range Status   MRSA by PCR Next Gen NOT DETECTED NOT DETECTED Final    Comment: (NOTE) The GeneXpert MRSA Assay (FDA approved for NASAL specimens only), is one component of a comprehensive MRSA colonization surveillance program. It is not intended to diagnose MRSA infection nor to guide or monitor  treatment for MRSA infections. Test performance is not FDA approved in patients less than 70 years old. Performed at [facility], [place] [address], [place], [place] [address]    Pertinent Imaging  All pertinent labs/Imagings/notes reviewed. All pertinent plain films and CT images have been personally visualized and interpreted; radiology reports have been reviewed. Decision making incorporated into the Impression / Recommendations.  I have spent a total of 60 minutes of face-to-face  and non-face-to-face time, excluding clinical staff time, preparing to see patient, ordering tests and/or medications, and provide counseling the patient    Electronically signed by:  Rosiland Oz, MD Infectious Disease Physician [facility] [address] [place], [place] [address] Phone: [phone]  Fax: [phone]

## 2020-09-13 ENCOUNTER — Other Ambulatory Visit: Payer: Self-pay

## 2020-09-13 NOTE — Progress Notes (Signed)
09/07/20 Patient in office for f/u Left upper extremity I & D by Dr. Arita Miss on 08/27/20 She has wet to dry dressings in place & I removed those. Remaining sutures were removed  Photos taken & entered into chart I used saline rinse to clean area & then replace the adaptic, wet gauze dressings & applied dry gauze dressings & ace wrap to above the elbow. She c/o moderate pain & decreased ROM. She denies any chills, fever, or any foul smelling drainage. Continues to take the course of antibiotics as prescribed. She has a f/u with Dr. Arita Miss on 09/28/20 She will continue wet to dry dressing changes & will call for any changes or concerns

## 2020-09-13 NOTE — Patient Instructions (Signed)
Continue taking antibiotics Wet to dry dressings Call for any changes or concerns

## 2020-09-20 ENCOUNTER — Other Ambulatory Visit: Payer: Self-pay

## 2020-09-28 ENCOUNTER — Ambulatory Visit: Payer: Self-pay | Admitting: Plastic Surgery

## 2020-10-25 ENCOUNTER — Other Ambulatory Visit: Payer: Self-pay

## 2020-10-25 ENCOUNTER — Ambulatory Visit (INDEPENDENT_AMBULATORY_CARE_PROVIDER_SITE_OTHER): Payer: Self-pay | Admitting: Plastic Surgery

## 2020-10-25 ENCOUNTER — Encounter: Payer: Self-pay | Admitting: Plastic Surgery

## 2020-10-25 DIAGNOSIS — L03114 Cellulitis of left upper limb: Secondary | ICD-10-CM

## 2020-10-25 NOTE — Progress Notes (Signed)
   Referring Provider Dartha Lodge, FNP [address] [place],  [place] [address]   CC:  Chief Complaint  Patient presents with   Follow-up      Anna Dennis is an 30 y.o. female.  HPI: Patient presents about 2 months postop from incision and drainage of a left volar wrist skin abscess.  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  She reports from that standpoint she is doing well but just feels like she has a residual suture that is limiting the use of her hand.  She does report difficulty in extending her wrist although she can extend her fingers fine.  She still has a wound in the left upper arm that is continuing to heal from a previous debridement.  Review of Systems General: Denies fevers and chills  Physical Exam Vitals with BMI 09/27/2020 09/12/2020 08/31/2020  Height 5\' 7"  5\' 7"  -  Weight 158 lbs 158 lbs -  BMI 24.74 24.74 -  Systolic 141 - 141  Diastolic 81 - 81  Pulse - - 108    General:  No acute distress,  Alert and oriented, Non-Toxic, Normal speech and affect Left hand: Fingers well-perfused with normal capillary refill to palp radial pulse.  Sensation is intact throughout on the volar and dorsal surfaces of her hand.  She can flex her wrist fine and can flex her fingers fully.  Specifically reports normal sensation in the median distribution.  She is having difficulty extending her wrist.  She says this is been present for quite some time even before the incision and drainage that I performed.  Her volar wrist has essentially completely epithelialized with a very small 1 to 2 mm area still yet to heal.  She did have a residual PDS suture which I removed.  When it comes to her wrist she does have difficulty actively extending.  She does have passive limitations to extension as well as I can range her to about neutral on her wrist but no further.  Assessment/Plan Patient is overall doing well after incision and drainage of her left volar wrist abscess.  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  I am planning to refer her to hand therapy for help with positioning her wrist a bit more functionally and to assist with range of motion.  She does feel like her wrist range of motion has been improving over the past few weeks so hopefully that we will continue.  Additionally she said that she had some dorsal forearm sensory loss which has gone away over the past few weeks as well.  She continues to have a wound in the upper arm which is open although gradually epithelializing from the borders.  We discussed a skin graft but she wants to hold off on that at this point.  We will plan to follow-up with her in around a month to reevaluate that.  All of her questions were answered.   10/25/2020, 3:30 PM

## 2020-10-25 NOTE — Addendum Note (Signed)
Addended by: Verdie Shire on: 10/25/2020 04:12 PM   Modules accepted: Orders

## 2020-11-30 ENCOUNTER — Ambulatory Visit: Payer: Self-pay | Admitting: Plastic Surgery

## 2021-01-01 ENCOUNTER — Telehealth: Payer: Self-pay | Admitting: Plastic Surgery

## 2021-01-01 NOTE — Telephone Encounter (Signed)
Called and spoke with the patient regarding the message below.  Informed the patient that I spoke with [facility], and he stated that since she's not able to get in to see Dr. Arita Miss until (01/24/2021) it would be best if she went to be seen at the Urgent Care or ED.   Also informed her that [facility] stated that she can send the pictures by way of MyChart, but it may be hard to really see what the area really looks like.  Patient verbalized understanding and agreed to go to the Urgent Care of ED.//AB/CMA

## 2021-01-01 NOTE — Telephone Encounter (Signed)
Patient called and stated that there is an area on her left upper arm that has turned black. She is going to try to send pictures through MyChart so I sent her a new activation code. Scheduled a follow up visit with Pace but it was a few weeks out so it would make her feel better if someone would reach out to her.  Please follow up

## 2021-01-24 ENCOUNTER — Ambulatory Visit: Payer: Self-pay | Admitting: Plastic Surgery

## 2021-03-22 ENCOUNTER — Other Ambulatory Visit: Payer: Self-pay

## 2021-03-22 ENCOUNTER — Ambulatory Visit (INDEPENDENT_AMBULATORY_CARE_PROVIDER_SITE_OTHER): Payer: Self-pay | Admitting: Plastic Surgery

## 2021-03-22 DIAGNOSIS — S41102S Unspecified open wound of left upper arm, sequela: Secondary | ICD-10-CM

## 2021-03-22 NOTE — Progress Notes (Signed)
° °  Referring Provider Dartha Lodge, FNP [address] [place],  [place] [address]   CC:  Chief Complaint  Patient presents with   Follow-up      Anna Dennis is an 31 y.o. female.  HPI: Patient presents in follow-up for left upper extremity wound.  I previously treated her for a volar wrist abscess which was treated by I&D.  She says that is healed up and she has normal sensation and function in her hand but believes she is tethered a bit in terms of extending her wrist due to the scar.  She interested in a skin graft for the left upper arm wound which had been present prior to my meeting her for the wrist abscess.  Review of Systems General: Denies fevers and chills  Physical Exam Vitals with BMI 09/27/2020 09/12/2020 08/31/2020  Height 5\' 7"  5\' 7"  -  Weight 158 lbs 158 lbs -  BMI 24.74 24.74 -  Systolic 141 - 141  Diastolic 81 - 81  Pulse - - 108    General:  No acute distress,  Alert and oriented, Non-Toxic, Normal speech and affect Left upper extremity: Fingers well-perfused normal cap refill palp radial pulse.  Sensation is intact throughout.  She has full range of motion of her fingers.  She has difficulty extending past neutral with her wrist which seems to be due to scar contracture volarly.  She has a open wound in the left upper arm that is about 6 cm in diameter with healthy granulation tissue at the base.  This is gradually epithelializing in from the borders based on prior exams.  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  She is interested in a split-thickness skin graft which we had discussed previously.  I think it is a reasonable time to do this.  We discussed risks include bleeding, infection, damage surrounding structures need for additional procedures.  We discussed donor site from the left thigh.  All of her questions were answered and she is interested in moving forward.  We reviewed that tobacco and drug use  can have adverse effects on healing after surgery.  03/22/2021, 3:16 PM

## 2021-04-02 NOTE — H&P (View-Only) (Signed)
? ?  Patient ID: Anna Dennis, female    DOB: 01/12/1991, 30 y.o.   MRN: [id number] ? ?Chief Complaint  ?Patient presents with  ? Pre-op Exam  ? ? ?  ICD-10-CM   ?1. Arm wound, left, sequela  S41.102S   ?  ? ? ? ?History of Present Illness: ?Anna Dennis is a 30 y.o.  female  with a history of chronic left upper arm wound.  She presents for preoperative evaluation for upcoming procedure, split-thickness skin graft left upper arm, scheduled for 04/16/2021 with Dr. Pace. ? ?The patient has not had problems with anesthesia.  She smokes 1 pack/day.  She plans to mitigate smoking tobacco and marijuana perioperatively.  Denies any personal or family history of blood clots or clotting disorder.  She has never had cancer.  Denies history of COPD or asthma.  Denies any recent hospitalizations or traumas.  She confirms that she has had this wound on her left upper extremity present since 06/2020.  She reports that it is getting smaller.  She also endorses history of iron deficiency anemia for which she takes ferrous sulfate, not regularly.  Reviewed chart and most recent CBC had hemoglobin in 10s.  Could not locate recent hemoglobin A1c in chart, but she states that it typically fluctuates between 8 to 9%. ? ?Summary of Previous Visit: Patient was seen for consult 04/19/2021.  At that time, reports mild tethering at site of her previous left arm volar wrist abscess I&D.  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  6 cm diameter open wound in the left upper extremity noted.  Healthy granular tissue.  Appears to be shrinking compared to prior exams.  Discussed STSG from left thigh, patient expressed understanding and was agreeable to the plan. ? ?PMH Significant for: Substance use disorder, left arm cellulitis, IDDM, iron deficiency anemia, tobacco use disorder. ? ? ?Past Medical History: ?Allergies: ?No Known Allergies ? ?Current Medications: ? ?Current Outpatient Medications:  ?  Accu-Chek Softclix  Lancets lancets, Use as directed., Disp: 100 each, Rfl: 5 ?  blood glucose meter kit and supplies KIT, Dispense based on patient and insurance preference. Use up to four times daily as directed., Disp: 1 each, Rfl: 0 ?  glucose blood (GLUCOSE METER TEST) test strip, Use as instructed, Disp: 100 each, Rfl: 5 ?  ibuprofen (ADVIL) 600 MG tablet, Take 1 tablet (600 mg total) by mouth every 6 (six) hours as needed (pain)., Disp: 30 tablet, Rfl: 0 ?  insulin glargine (LANTUS SOLOSTAR) 100 UNIT/ML Solostar Pen, Inject 50 Units into the skin at bedtime., Disp: , Rfl:  ?  insulin lispro (HUMALOG) 100 UNIT/ML injection, Inject 1-12 Units into the skin 3 (three) times daily before meals. Per sliding scale, Disp: , Rfl:  ?  insulin regular (NOVOLIN R) 100 units/mL injection, Inject 50 Units into the skin at bedtime., Disp: , Rfl:  ?  amLODipine (NORVASC) 5 MG tablet, Take 1 tablet (5 mg total) by mouth daily., Disp: 30 tablet, Rfl: 0 ?  medroxyPROGESTERone (DEPO-PROVERA) 150 MG/ML injection, Inject 150 mg into the muscle every 3 (three) months., Disp: , Rfl:  ? ?Past Medical Problems: ?Past Medical History:  ?Diagnosis Date  ? Depression   ? Diabetes mellitus   ? Insulin pump  ? GERD (gastroesophageal reflux disease)   ? Hepatitis C   ? 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 Iron deficiency anemia   ? IVDU (intravenous drug user)   ? OCD (obsessive compulsive disorder)   ? ? ?  Past Surgical History: ?Past Surgical History:  ?Procedure Laterality Date  ? I & D EXTREMITY Left 07/11/2020  ? Procedure: IRRIGATION AND DEBRIDEMENT EXTREMITY;  Surgeon: Adair, Christopher R, MD;  Location: [facility] OR;  Service: Orthopedics;  Laterality: Left;  ? I & D EXTREMITY Left 08/27/2020  ? Procedure: IRRIGATION AND DEBRIDEMENT HAND;  Surgeon: Pace, Collier S, MD;  Location: [facility] OR;  Service: Plastics;  Laterality: Left;  ? ? ?Social History: ?Social History  ? ?Socioeconomic History  ? Marital status: Single  ?  Spouse name: Not on file  ? Number of children: Not on  file  ? Years of education: Not on file  ? Highest education level: Not on file  ?Occupational History  ? Not on file  ?Tobacco Use  ? Smoking status: Every Day  ?  Types: Cigarettes  ? Smokeless tobacco: Never  ?Substance and Sexual Activity  ? Alcohol use: Yes  ?  Alcohol/week: 0.0 standard drinks  ?  Comment: occasional  ? Drug use: Yes  ?  Comment: patient states clean since 03/29/14  ? Sexual activity: Not on file  ?Other Topics Concern  ? Not on file  ?Social History Narrative  ? Not on file  ? ?Social Determinants of Health  ? ?Financial Resource Strain: Not on file  ?Food Insecurity: Not on file  ?Transportation Needs: Not on file  ?Physical Activity: Not on file  ?Stress: Not on file  ?Social Connections: Not on file  ?Intimate Partner Violence: Not on file  ? ? ?Family History: ?Family History  ?Problem Relation Age of Onset  ? Bipolar disorder Mother   ? Schizophrenia Sister   ? ? ?Review of Systems: ?ROS ?Denies recent illness, trauma, or infection. ? ?Physical Exam: ?Vital Signs ?BP 113/75 (BP Location: Right Arm, Patient Position: Sitting, Cuff Size: Large)   Pulse 94   Ht 5' 7" (1.702 m)   Wt 178 lb (80.7 kg)   SpO2 97%   BMI 27.88 kg/m?  ? ?Physical Exam ?Constitutional:   ?   General: Not in acute distress. ?   Appearance: Normal appearance. Not ill-appearing.  ?HENT:  ?   Head: Normocephalic and atraumatic.  ?Eyes:  ?   Pupils: Pupils are equal, round. ?Cardiovascular:  ?   Rate and Rhythm: Normal rate. ?   Pulses: Normal pulses.  ?Pulmonary:  ?   Effort: No respiratory distress or increased work of breathing.  Speaks in full sentences. ?Abdominal:  ?   General: Abdomen is flat. No distension.   ?Musculoskeletal: Normal range of motion. No lower extremity swelling or edema. No varicosities. ?Skin: ?   General: Skin is warm and dry.  ?   Findings: No erythema or rash.  ?Neurological:  ?   Mental Status: Alert and oriented to person, place, and time.  ?Psychiatric:     ?   Mood and Affect: Mood  normal.     ?   Behavior: Behavior normal.  ? ? ?Assessment/Plan: ?The patient is scheduled for STSG left upper arm with Dr. Pace.  Risks, benefits, and alternatives of procedure discussed, questions answered and consent obtained.   ? ?Smoking Status: Daily pack per day smoker; Counseling Given: Emphasized the importance of mitigating nicotine use.  She understands that regular tobacco use translates to poor wound healing. ? ?Caprini Score: 4; Risk Factors include: Daily tobacco use, BMI greater than 25, IDDM, and length of planned surgery. Recommendation for mechanical prophylaxis. Encourage early ambulation.  ? ?Pictures obtained: Today. ? ?Post-op   Rx sent to pharmacy: None.  Antibiotics not indicated.  We will try to manage pain symptoms with alternating ibuprofen and Tylenol.  She will call the clinic if pain uncontrolled with over-the-counter analgesics. ? ?Patient was provided with the General Surgical Risk consent document and Pain Medication Agreement prior to their appointment.  They had adequate time to read through the risk consent documents and Pain Medication Agreement. We also discussed them in person together during this preop appointment. All of their questions were answered to their satisfaction.  Recommended calling if they have any further questions.  Risk consent form and Pain Medication Agreement to be scanned into patient's chart. ? ?Emphasized the risks of poor wound healing given patient's poorly controlled IDDM and regular tobacco use disorder.   ? ?Electronically signed by: Todrick Siedschlag, PA-C 04/04/2021 10:53 AM ?

## 2021-04-02 NOTE — Progress Notes (Signed)
? ?  Patient ID: Anna Dennis, female    DOB: Sep 21, 1990, 31 y.o.   MRN: [id number] ? ?Chief Complaint  ?Patient presents with  ? Pre-op Exam  ? ? ?  ICD-10-CM   ?1. Arm wound, left, sequela  S41.102S   ?  ? ? ? ?History of Present Illness: ?Anna Dennis is a 31 y.o.  female  with a history of chronic left upper arm wound.  She presents for preoperative evaluation for upcoming procedure, split-thickness skin graft left upper arm, scheduled for 04/16/2021 with Dr. Claudia Desanctis. ? ?The patient has not had problems with anesthesia.  She smokes 1 pack/day.  She plans to mitigate smoking tobacco and marijuana perioperatively.  Denies any personal or family history of blood clots or clotting disorder.  She has never had cancer.  Denies history of COPD or asthma.  Denies any recent hospitalizations or traumas.  She confirms that she has had this wound on her left upper extremity present since 06/2020.  She reports that it is getting smaller.  She also endorses history of iron deficiency anemia for which she takes ferrous sulfate, not regularly.  Reviewed chart and most recent CBC had hemoglobin in 10s.  Could not locate recent hemoglobin A1c in chart, but she states that it typically fluctuates between 8 to 9%. ? ?Summary of Previous Visit: Patient was seen for consult 04/19/2021.  At that time, reports mild tethering at site of her previous left arm volar wrist abscess I&D.  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  6 cm diameter open wound in the left upper extremity noted.  Healthy granular tissue.  Appears to be shrinking compared to prior exams.  Discussed STSG from left thigh, patient expressed understanding and was agreeable to the plan. ? ?PMH Significant for: Substance use disorder, left arm cellulitis, IDDM, iron deficiency anemia, tobacco use disorder. ? ? ?Past Medical History: ?Allergies: ?No Known Allergies ? ?Current Medications: ? ?Current Outpatient Medications:  ?  Accu-Chek Softclix  Lancets lancets, Use as directed., Disp: 100 each, Rfl: 5 ?  blood glucose meter kit and supplies KIT, Dispense based on patient and insurance preference. Use up to four times daily as directed., Disp: 1 each, Rfl: 0 ?  glucose blood (GLUCOSE METER TEST) test strip, Use as instructed, Disp: 100 each, Rfl: 5 ?  ibuprofen (ADVIL) 600 MG tablet, Take 1 tablet (600 mg total) by mouth every 6 (six) hours as needed (pain)., Disp: 30 tablet, Rfl: 0 ?  insulin glargine (LANTUS SOLOSTAR) 100 UNIT/ML Solostar Pen, Inject 50 Units into the skin at bedtime., Disp: , Rfl:  ?  insulin lispro (HUMALOG) 100 UNIT/ML injection, Inject 1-12 Units into the skin 3 (three) times daily before meals. Per sliding scale, Disp: , Rfl:  ?  insulin regular (NOVOLIN R) 100 units/mL injection, Inject 50 Units into the skin at bedtime., Disp: , Rfl:  ?  amLODipine (NORVASC) 5 MG tablet, Take 1 tablet (5 mg total) by mouth daily., Disp: 30 tablet, Rfl: 0 ?  medroxyPROGESTERone (DEPO-PROVERA) 150 MG/ML injection, Inject 150 mg into the muscle every 3 (three) months., Disp: , Rfl:  ? ?Past Medical Problems: ?Past Medical History:  ?Diagnosis Date  ? Depression   ? Diabetes mellitus   ? Insulin pump  ? GERD (gastroesophageal reflux disease)   ? Hepatitis C   ? 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 Iron deficiency anemia   ? IVDU (intravenous drug user)   ? OCD (obsessive compulsive disorder)   ? ? ?  Past Surgical History: ?Past Surgical History:  ?Procedure Laterality Date  ? I & D EXTREMITY Left 07/11/2020  ? Procedure: IRRIGATION AND DEBRIDEMENT EXTREMITY;  Surgeon: Erle Crocker, MD;  Location: [facility];  Service: Orthopedics;  Laterality: Left;  ? I & D EXTREMITY Left 08/27/2020  ? Procedure: IRRIGATION AND DEBRIDEMENT HAND;  Surgeon: Cindra Presume, MD;  Location: [facility];  Service: Plastics;  Laterality: Left;  ? ? ?Social History: ?Social History  ? ?Socioeconomic History  ? Marital status: Single  ?  Spouse name: Not on file  ? Number of children: Not on  file  ? Years of education: Not on file  ? Highest education level: Not on file  ?Occupational History  ? Not on file  ?Tobacco Use  ? Smoking status: Every Day  ?  Types: Cigarettes  ? Smokeless tobacco: Never  ?Substance and Sexual Activity  ? Alcohol use: Yes  ?  Alcohol/week: 0.0 standard drinks  ?  Comment: occasional  ? Drug use: Yes  ?  Comment: patient states clean since 03/29/14  ? Sexual activity: Not on file  ?Other Topics Concern  ? Not on file  ?Social History Narrative  ? Not on file  ? ?Social Determinants of Health  ? ?Financial Resource Strain: Not on file  ?Food Insecurity: Not on file  ?Transportation Needs: Not on file  ?Physical Activity: Not on file  ?Stress: Not on file  ?Social Connections: Not on file  ?Intimate Partner Violence: Not on file  ? ? ?Family History: ?Family History  ?Problem Relation Age of Onset  ? Bipolar disorder Mother   ? Schizophrenia Sister   ? ? ?Review of Systems: ?ROS ?Denies recent illness, trauma, or infection. ? ?Physical Exam: ?Vital Signs ?BP 113/75 (BP Location: Right Arm, Patient Position: Sitting, Cuff Size: Large)   Pulse 94   Ht 5' 7" (1.702 m)   Wt 178 lb (80.7 kg)   SpO2 97%   BMI 27.88 kg/m?  ? ?Physical Exam ?Constitutional:   ?   General: Not in acute distress. ?   Appearance: Normal appearance. Not ill-appearing.  ?HENT:  ?   Head: Normocephalic and atraumatic.  ?Eyes:  ?   Pupils: Pupils are equal, round. ?Cardiovascular:  ?   Rate and Rhythm: Normal rate. ?   Pulses: Normal pulses.  ?Pulmonary:  ?   Effort: No respiratory distress or increased work of breathing.  Speaks in full sentences. ?Abdominal:  ?   General: Abdomen is flat. No distension.   ?Musculoskeletal: Normal range of motion. No lower extremity swelling or edema. No varicosities. ?Skin: ?   General: Skin is warm and dry.  ?   Findings: No erythema or rash.  ?Neurological:  ?   Mental Status: Alert and oriented to person, place, and time.  ?Psychiatric:     ?   Mood and Affect: Mood  normal.     ?   Behavior: Behavior normal.  ? ? ?Assessment/Plan: ?The patient is scheduled for STSG left upper arm with Dr. Claudia Desanctis.  Risks, benefits, and alternatives of procedure discussed, questions answered and consent obtained.   ? ?Smoking Status: Daily pack per day smoker; Counseling Given: Emphasized the importance of mitigating nicotine use.  She understands that regular tobacco use translates to poor wound healing. ? ?Caprini Score: 4; Risk Factors include: Daily tobacco use, BMI greater than 25, IDDM, and length of planned surgery. Recommendation for mechanical prophylaxis. Encourage early ambulation.  ? ?Pictures obtained: Today. ? ?Post-op  Rx sent to pharmacy: None.  Antibiotics not indicated.  We will try to manage pain symptoms with alternating ibuprofen and Tylenol.  She will call the clinic if pain uncontrolled with over-the-counter analgesics. ? ?Patient was provided with the General Surgical Risk consent document and Pain Medication Agreement prior to their appointment.  They had adequate time to read through the risk consent documents and Pain Medication Agreement. We also discussed them in person together during this preop appointment. All of their questions were answered to their satisfaction.  Recommended calling if they have any further questions.  Risk consent form and Pain Medication Agreement to be scanned into patient's chart. ? ?Emphasized the risks of poor wound healing given patient's poorly controlled IDDM and regular tobacco use disorder.   ? ?Electronically signed by: Krista Blue, PA-C 04/04/2021 10:53 AM ?

## 2021-04-04 ENCOUNTER — Encounter: Payer: Self-pay | Admitting: Physician Assistant

## 2021-04-04 ENCOUNTER — Ambulatory Visit (INDEPENDENT_AMBULATORY_CARE_PROVIDER_SITE_OTHER): Payer: Self-pay | Admitting: Physician Assistant

## 2021-04-04 ENCOUNTER — Other Ambulatory Visit: Payer: Self-pay

## 2021-04-04 VITALS — BP 113/75 | HR 94 | Ht 67.0 in | Wt 178.0 lb

## 2021-04-04 DIAGNOSIS — S41102S Unspecified open wound of left upper arm, sequela: Secondary | ICD-10-CM

## 2021-04-13 NOTE — Progress Notes (Signed)
DUE TO COVID-19 ONLY ONE VISITOR IS ALLOWED TO COME WITH YOU AND STAY IN THE WAITING ROOM ONLY DURING PRE OP AND PROCEDURE DAY OF SURGERY.  ? ?PCP - Elbert Ewings, NP ?Cardiologist - n/a ? ?Chest x-ray - 07/11/20 (1V) ?EKG - 07/11/20 ?Stress Test - n/a ?ECHO - 08/28/20 ?Cardiac Cath - n/a ? ?ICD Pacemaker/Loop - n/a ? ?Sleep Study -  n/a ?CPAP - none ? ?THE NIGHT BEFORE SURGERY, take 35 units of Novolin Insulin.   ?   ?If your CBG is greater than 220 mg/dL, you may take ? of your sliding scale (correction) dose of insulin. ? ?If your blood sugar is less than 70 mg/dL, you will need to treat for low blood sugar: ?Treat a low blood sugar (less than 70 mg/dL) with ? cup of clear juice (cranberry or apple), 4 glucose tablets, OR glucose gel. ?Recheck blood sugar in 15 minutes after treatment (to make sure it is greater than 70 mg/dL). If your blood sugar is not greater than 70 mg/dL on recheck, call [phone] for further instructions. ? ?Anesthesia review: Yes ? ?STOP now taking any Aspirin (unless otherwise instructed by your surgeon), Aleve, Naproxen, Ibuprofen, Motrin, Advil, Goody's, BC's, all herbal medications, fish oil, and all vitamins.  ? ?Coronavirus Screening ?Covid test n/a Ambulatory Surgery  ? ?Patient verbalized understanding of instructions that were given via phone. ?

## 2021-04-16 ENCOUNTER — Ambulatory Visit (HOSPITAL_COMMUNITY): Payer: Self-pay | Admitting: Physician Assistant

## 2021-04-16 ENCOUNTER — Ambulatory Visit (HOSPITAL_BASED_OUTPATIENT_CLINIC_OR_DEPARTMENT_OTHER): Payer: Self-pay | Admitting: Physician Assistant

## 2021-04-16 ENCOUNTER — Other Ambulatory Visit: Payer: Self-pay

## 2021-04-16 ENCOUNTER — Other Ambulatory Visit (INDEPENDENT_AMBULATORY_CARE_PROVIDER_SITE_OTHER): Payer: Self-pay | Admitting: Physician Assistant

## 2021-04-16 ENCOUNTER — Encounter (HOSPITAL_COMMUNITY)
Admission: RE | Disposition: A | Discharge status: Home / Self Care | Payer: Self-pay | Source: Home / Self Care | Attending: Plastic Surgery

## 2021-04-16 ENCOUNTER — Encounter (HOSPITAL_COMMUNITY): Payer: Self-pay | Admitting: Plastic Surgery

## 2021-04-16 ENCOUNTER — Ambulatory Visit (HOSPITAL_COMMUNITY)
Admission: RE | Admit: 2021-04-16 | Discharge: 2021-04-16 | Disposition: A | Discharge status: Home / Self Care | Payer: Self-pay | Attending: Plastic Surgery | Admitting: Plastic Surgery

## 2021-04-16 DIAGNOSIS — Z941 Heart transplant status: Secondary | ICD-10-CM | POA: Insufficient documentation

## 2021-04-16 DIAGNOSIS — S41102S Unspecified open wound of left upper arm, sequela: Secondary | ICD-10-CM

## 2021-04-16 DIAGNOSIS — F32A Depression, unspecified: Secondary | ICD-10-CM | POA: Insufficient documentation

## 2021-04-16 DIAGNOSIS — F17200 Nicotine dependence, unspecified, uncomplicated: Secondary | ICD-10-CM | POA: Insufficient documentation

## 2021-04-16 DIAGNOSIS — X58XXXS Exposure to other specified factors, sequela: Secondary | ICD-10-CM | POA: Insufficient documentation

## 2021-04-16 DIAGNOSIS — F419 Anxiety disorder, unspecified: Secondary | ICD-10-CM | POA: Insufficient documentation

## 2021-04-16 DIAGNOSIS — S41102A Unspecified open wound of left upper arm, initial encounter: Secondary | ICD-10-CM

## 2021-04-16 DIAGNOSIS — E119 Type 2 diabetes mellitus without complications: Secondary | ICD-10-CM | POA: Insufficient documentation

## 2021-04-16 DIAGNOSIS — F1120 Opioid dependence, uncomplicated: Secondary | ICD-10-CM | POA: Insufficient documentation

## 2021-04-16 DIAGNOSIS — Z794 Long term (current) use of insulin: Secondary | ICD-10-CM

## 2021-04-16 DIAGNOSIS — K219 Gastro-esophageal reflux disease without esophagitis: Secondary | ICD-10-CM | POA: Insufficient documentation

## 2021-04-16 HISTORY — PX: SKIN SPLIT GRAFT: SHX444

## 2021-04-16 LAB — COMPREHENSIVE METABOLIC PANEL
ALT: 15 U/L (ref 0–44)
AST: 19 U/L (ref 15–41)
Albumin: 3.6 g/dL (ref 3.5–5.0)
Alkaline Phosphatase: 119 U/L (ref 38–126)
Anion gap: 13 (ref 5–15)
BUN: 16 mg/dL (ref 6–20)
CO2: 23 mmol/L (ref 22–32)
Calcium: 9.1 mg/dL (ref 8.9–10.3)
Chloride: 97 mmol/L — ABNORMAL LOW (ref 98–111)
Creatinine, Ser: 0.8 mg/dL (ref 0.44–1.00)
GFR, Estimated: >60 mL/min (ref >60)
Glucose, Bld: 254 mg/dL — ABNORMAL HIGH (ref 70–99)
Potassium: 3.8 mmol/L (ref 3.5–5.1)
Sodium: 133 mmol/L — ABNORMAL LOW (ref 135–145)
Total Bilirubin: 0.4 mg/dL (ref 0.3–1.2)
Total Protein: 7 g/dL (ref 6.5–8.1)

## 2021-04-16 LAB — GLUCOSE, CAPILLARY
Glucose-Capillary: 259 mg/dL — ABNORMAL HIGH (ref 70–99)
Glucose-Capillary: 119 mg/dL — ABNORMAL HIGH (ref 70–99)

## 2021-04-16 LAB — CBC
HCT: 35.6 % — ABNORMAL LOW (ref 36.0–46.0)
Hemoglobin: 11.4 g/dL — ABNORMAL LOW (ref 12.0–15.0)
MCH: 28.1 pg (ref 26.0–34.0)
MCHC: 32 g/dL (ref 30.0–36.0)
MCV: 87.9 fL (ref 80.0–100.0)
Platelets: 339 10*3/uL (ref 150–400)
RBC: 4.05 MIL/uL (ref 3.87–5.11)
RDW: 13.8 % (ref 11.5–15.5)
WBC: 8 10*3/uL (ref 4.0–10.5)
nRBC: 0 % (ref 0.0–0.2)

## 2021-04-16 SURGERY — APPLICATION, GRAFT, SKIN, SPLIT-THICKNESS
Anesthesia: General | Site: Arm Upper | Laterality: Left

## 2021-04-16 MED ORDER — OXYCODONE HCL 5 MG/5ML PO SOLN: 5.0000 mg | Freq: Once | ORAL | Status: AC | PRN

## 2021-04-16 MED ORDER — KETAMINE HCL 50 MG/5ML IJ SOSY
PREFILLED_SYRINGE | INTRAMUSCULAR | Status: AC
  Filled 2021-04-16: qty 5

## 2021-04-16 MED ORDER — FENTANYL CITRATE (PF) 250 MCG/5ML IJ SOLN
INTRAMUSCULAR | Status: DC | PRN
  Administered 2021-04-16 (×2): 100 ug via INTRAVENOUS
  Administered 2021-04-16: 50 ug via INTRAVENOUS

## 2021-04-16 MED ORDER — PROMETHAZINE HCL 25 MG/ML IJ SOLN: 6.2500 mg | INTRAMUSCULAR | Status: DC | PRN

## 2021-04-16 MED ORDER — OXYCODONE HCL 5 MG PO TABS
5.0000 mg | ORAL_TABLET | Freq: Once | ORAL | Status: AC | PRN
  Administered 2021-04-16: 5 mg via ORAL

## 2021-04-16 MED ORDER — DEXAMETHASONE SODIUM PHOSPHATE 10 MG/ML IJ SOLN
INTRAMUSCULAR | Status: DC | PRN
  Administered 2021-04-16: 5 mg via INTRAVENOUS

## 2021-04-16 MED ORDER — DEXMEDETOMIDINE (PRECEDEX) IN NS 20 MCG/5ML (4 MCG/ML) IV SYRINGE
PREFILLED_SYRINGE | INTRAVENOUS | Status: DC | PRN
  Administered 2021-04-16 (×2): 8 ug via INTRAVENOUS

## 2021-04-16 MED ORDER — DEXMEDETOMIDINE (PRECEDEX) IN NS 20 MCG/5ML (4 MCG/ML) IV SYRINGE
PREFILLED_SYRINGE | INTRAVENOUS | Status: AC
  Filled 2021-04-16: qty 5

## 2021-04-16 MED ORDER — HYDROMORPHONE HCL 1 MG/ML IJ SOLN
0.2500 mg | INTRAMUSCULAR | Status: DC | PRN
  Administered 2021-04-16 (×4): 0.5 mg via INTRAVENOUS

## 2021-04-16 MED ORDER — HYDROMORPHONE HCL 2 MG PO TABS: 1.0000 mg | ORAL_TABLET | Freq: Once | ORAL | Status: DC

## 2021-04-16 MED ORDER — EPINEPHRINE PF 1 MG/ML IJ SOLN
INTRAMUSCULAR | Status: AC
  Filled 2021-04-16: qty 1

## 2021-04-16 MED ORDER — CHLORHEXIDINE GLUCONATE 0.12 % MT SOLN
15.0000 mL | Freq: Once | OROMUCOSAL | Status: AC
  Administered 2021-04-16: 15 mL via OROMUCOSAL
  Filled 2021-04-16: qty 15

## 2021-04-16 MED ORDER — 0.9 % SODIUM CHLORIDE (POUR BTL) OPTIME
TOPICAL | Status: DC | PRN
  Administered 2021-04-16: 1000 mL

## 2021-04-16 MED ORDER — OXYCODONE HCL 5 MG PO TABS
ORAL_TABLET | ORAL | Status: AC
  Filled 2021-04-16: qty 1

## 2021-04-16 MED ORDER — PROPOFOL 10 MG/ML IV BOLUS
INTRAVENOUS | Status: DC | PRN
  Administered 2021-04-16: 40 mg via INTRAVENOUS
  Administered 2021-04-16: 200 mg via INTRAVENOUS

## 2021-04-16 MED ORDER — KETAMINE HCL 10 MG/ML IJ SOLN
INTRAMUSCULAR | Status: DC | PRN
Start: 2021-04-16 — End: 2021-04-16
  Administered 2021-04-16: 50 mg via INTRAVENOUS

## 2021-04-16 MED ORDER — FENTANYL CITRATE (PF) 250 MCG/5ML IJ SOLN
INTRAMUSCULAR | Status: AC
  Filled 2021-04-16: qty 5

## 2021-04-16 MED ORDER — MIDAZOLAM HCL 5 MG/5ML IJ SOLN
INTRAMUSCULAR | Status: DC | PRN
Start: 2021-04-16 — End: 2021-04-16
  Administered 2021-04-16: 2 mg via INTRAVENOUS

## 2021-04-16 MED ORDER — ONDANSETRON HCL 4 MG/2ML IJ SOLN
INTRAMUSCULAR | Status: AC
  Filled 2021-04-16: qty 2

## 2021-04-16 MED ORDER — CEFAZOLIN SODIUM-DEXTROSE 2-4 GM/100ML-% IV SOLN
2.0000 g | INTRAVENOUS | Status: AC
  Administered 2021-04-16: 2 g via INTRAVENOUS
  Filled 2021-04-16: qty 100

## 2021-04-16 MED ORDER — ONDANSETRON 4 MG PO TBDP: 4.0000 mg | ORAL_TABLET | Freq: Three times a day (TID) | ORAL | 0 refills | Status: DC | PRN

## 2021-04-16 MED ORDER — SODIUM BICARBONATE 4.2 % IV SOLN
INTRAVENOUS | Status: DC | PRN
  Administered 2021-04-16: 1000 mL via INTRAMUSCULAR

## 2021-04-16 MED ORDER — ORAL CARE MOUTH RINSE: 15.0000 mL | Freq: Once | OROMUCOSAL | Status: AC

## 2021-04-16 MED ORDER — INSULIN ASPART 100 UNIT/ML IJ SOLN
INTRAMUSCULAR | Status: AC
  Administered 2021-04-16: 6 [IU] via SUBCUTANEOUS
  Filled 2021-04-16: qty 1

## 2021-04-16 MED ORDER — INSULIN ASPART 100 UNIT/ML IJ SOLN: 0.0000 [IU] | INTRAMUSCULAR | Status: DC | PRN

## 2021-04-16 MED ORDER — ONDANSETRON HCL 4 MG/2ML IJ SOLN
INTRAMUSCULAR | Status: DC | PRN
  Administered 2021-04-16: 4 mg via INTRAVENOUS

## 2021-04-16 MED ORDER — HYDROMORPHONE HCL 1 MG/ML IJ SOLN
INTRAMUSCULAR | Status: AC
  Filled 2021-04-16: qty 1

## 2021-04-16 MED ORDER — PROPOFOL 10 MG/ML IV BOLUS
INTRAVENOUS | Status: AC
  Filled 2021-04-16: qty 20

## 2021-04-16 MED ORDER — DEXAMETHASONE SODIUM PHOSPHATE 10 MG/ML IJ SOLN
INTRAMUSCULAR | Status: AC
  Filled 2021-04-16: qty 1

## 2021-04-16 MED ORDER — MINERAL OIL LIGHT 100 % EX OIL
TOPICAL_OIL | CUTANEOUS | Status: DC | PRN
  Administered 2021-04-16: 1 via TOPICAL

## 2021-04-16 MED ORDER — AMISULPRIDE (ANTIEMETIC) 5 MG/2ML IV SOLN: 10.0000 mg | Freq: Once | INTRAVENOUS | Status: DC | PRN

## 2021-04-16 MED ORDER — LIDOCAINE 2% (20 MG/ML) 5 ML SYRINGE
INTRAMUSCULAR | Status: DC | PRN
  Administered 2021-04-16: 60 mg via INTRAVENOUS

## 2021-04-16 MED ORDER — HYDROMORPHONE HCL 2 MG PO TABS
ORAL_TABLET | ORAL | Status: AC
  Filled 2021-04-16: qty 1

## 2021-04-16 MED ORDER — BUPIVACAINE HCL (PF) 0.25 % IJ SOLN
INTRAMUSCULAR | Status: AC
  Filled 2021-04-16: qty 30

## 2021-04-16 MED ORDER — LIDOCAINE 2% (20 MG/ML) 5 ML SYRINGE
INTRAMUSCULAR | Status: AC
  Filled 2021-04-16: qty 5

## 2021-04-16 MED ORDER — MIDAZOLAM HCL 2 MG/2ML IJ SOLN
INTRAMUSCULAR | Status: AC
  Filled 2021-04-16: qty 2

## 2021-04-16 MED ORDER — OXYCODONE HCL 5 MG PO TABS: 5.0000 mg | ORAL_TABLET | Freq: Three times a day (TID) | ORAL | 0 refills | Status: AC | PRN

## 2021-04-16 MED ORDER — LACTATED RINGERS IV SOLN: INTRAVENOUS | Status: DC

## 2021-04-16 MED ORDER — SODIUM BICARBONATE 4.2 % IV SOLN
Freq: Once | INTRAVENOUS | Status: DC
  Filled 2021-04-16: qty 50

## 2021-04-16 SURGICAL SUPPLY — 58 items
BAG DECANTER FOR FLEXI CONT (MISCELLANEOUS) IMPLANT
BLADE CLIPPER SURG (BLADE) IMPLANT
BLADE DERMATOME SS (BLADE) ×1 IMPLANT
BNDG COHESIVE 6X5 TAN NS LF (GAUZE/BANDAGES/DRESSINGS) ×1 IMPLANT
BNDG ELASTIC 4X5.8 VLCR STR LF (GAUZE/BANDAGES/DRESSINGS) ×1 IMPLANT
BNDG ELASTIC 6X5.8 VLCR STR LF (GAUZE/BANDAGES/DRESSINGS) IMPLANT
BNDG GAUZE ELAST 4 BULKY (GAUZE/BANDAGES/DRESSINGS) ×1 IMPLANT
BRUSH SCRUB EZ PLAIN DRY (MISCELLANEOUS) ×4 IMPLANT
CANISTER SUCT 3000ML PPV (MISCELLANEOUS) ×2 IMPLANT
CANISTER WOUND CARE 500ML ATS (WOUND CARE) IMPLANT
COVER SURGICAL LIGHT HANDLE (MISCELLANEOUS) ×2 IMPLANT
DERMACARRIERS GRAFT 1 TO 1.5 (DISPOSABLE) ×2
DRAPE EXTREMITY T 121X128X90 (DISPOSABLE) IMPLANT
DRAPE HALF SHEET 40X57 (DRAPES) ×3 IMPLANT
DRAPE INCISE IOBAN 66X45 STRL (DRAPES) ×1 IMPLANT
DRAPE ORTHO SPLIT 77X108 STRL (DRAPES) ×4
DRAPE SURG ORHT 6 SPLT 77X108 (DRAPES) ×2 IMPLANT
DRSG CALCIUM ALGINATE 4X4 (GAUZE/BANDAGES/DRESSINGS) IMPLANT
DRSG MEPITEL 4X7.2 (GAUZE/BANDAGES/DRESSINGS) IMPLANT
DRSG OPSITE 6X11 MED (GAUZE/BANDAGES/DRESSINGS) IMPLANT
DRSG PAD ABDOMINAL 8X10 ST (GAUZE/BANDAGES/DRESSINGS) ×1 IMPLANT
DRSG VAC ATS LRG SENSATRAC (GAUZE/BANDAGES/DRESSINGS) IMPLANT
DRSG VAC ATS MED SENSATRAC (GAUZE/BANDAGES/DRESSINGS) IMPLANT
DRSG VAC ATS SM SENSATRAC (GAUZE/BANDAGES/DRESSINGS) IMPLANT
ELECT REM PT RETURN 9FT ADLT (ELECTROSURGICAL) ×2
ELECTRODE REM PT RTRN 9FT ADLT (ELECTROSURGICAL) IMPLANT
FILTER STRAW FLUID ASPIR (MISCELLANEOUS) IMPLANT
GAUZE SPONGE 4X4 12PLY STRL (GAUZE/BANDAGES/DRESSINGS) ×3 IMPLANT
GAUZE SPONGE 4X4 12PLY STRL LF (GAUZE/BANDAGES/DRESSINGS) ×1 IMPLANT
GAUZE XEROFORM 5X9 LF (GAUZE/BANDAGES/DRESSINGS) ×2 IMPLANT
GLOVE SURG ENC TEXT LTX SZ7.5 (GLOVE) ×4 IMPLANT
GLOVE SURG UNDER LTX SZ8 (GLOVE) ×4 IMPLANT
GOWN STRL REUS W/ TWL LRG LVL3 (GOWN DISPOSABLE) ×2 IMPLANT
GOWN STRL REUS W/TWL LRG LVL3 (GOWN DISPOSABLE) ×4
GRAFT DERMACARRIERS 1 TO 1.5 (DISPOSABLE) IMPLANT
HANDPIECE INTERPULSE COAX TIP (DISPOSABLE)
IV NS 1000ML (IV SOLUTION) ×2
IV NS 1000ML BAXH (IV SOLUTION) ×1 IMPLANT
KIT BASIN OR (CUSTOM PROCEDURE TRAY) ×2 IMPLANT
KIT TURNOVER KIT B (KITS) ×2 IMPLANT
MANIFOLD NEPTUNE II (INSTRUMENTS) ×2 IMPLANT
NDL SPNL 18GX3.5 QUINCKE PK (NEEDLE) IMPLANT
NEEDLE SPNL 18GX3.5 QUINCKE PK (NEEDLE) ×2 IMPLANT
NS IRRIG 1000ML POUR BTL (IV SOLUTION) ×2 IMPLANT
PACK GENERAL/GYN (CUSTOM PROCEDURE TRAY) ×2 IMPLANT
PAD ARMBOARD 7.5X6 YLW CONV (MISCELLANEOUS) ×4 IMPLANT
SET HNDPC FAN SPRY TIP SCT (DISPOSABLE) IMPLANT
STAPLER VISISTAT 35W (STAPLE) ×1 IMPLANT
STOCKINETTE IMPERVIOUS 9X36 MD (GAUZE/BANDAGES/DRESSINGS) ×1 IMPLANT
SUT CHROMIC 4 0 PS 2 18 (SUTURE) ×2 IMPLANT
SUT ETHILON 3 0 PS 1 (SUTURE) ×2 IMPLANT
SUT PDS AB 3-0 SH 27 (SUTURE) IMPLANT
SYR 50ML LL SCALE MARK (SYRINGE) ×1 IMPLANT
SYR CONTROL 10ML LL (SYRINGE) ×2 IMPLANT
TAPE CLOTH 4X10 WHT NS (GAUZE/BANDAGES/DRESSINGS) ×1 IMPLANT
TOWEL GREEN STERILE (TOWEL DISPOSABLE) ×2 IMPLANT
TOWEL GREEN STERILE FF (TOWEL DISPOSABLE) ×2 IMPLANT
UNDERPAD 30X36 HEAVY ABSORB (UNDERPADS AND DIAPERS) ×2 IMPLANT

## 2021-04-16 NOTE — Op Note (Signed)
Operative Note  ? ?DATE OF OPERATION: 04/16/2021 ? ?SURGICAL DEPARTMENT: Plastic Surgery ? ?PREOPERATIVE DIAGNOSES: Left arm wound ? ?POSTOPERATIVE DIAGNOSES:  same ? ?PROCEDURE: 1.  Surgical preparation for coverage of left arm wound totaling 5 x 3 cm ?2.  Split-thickness skin graft left arm wound totaling 5 x 3 cm ? ?SURGEON: Ancil Linsey, MD ? ?ASSISTANT: Evelena Leyden, PA ?The advanced practice practitioner (APP) assisted throughout the case.  The APP was essential in retraction and counter traction when needed to make the case progress smoothly.  This retraction and assistance made it possible to see the tissue planes for the procedure.  The assistance was needed for hemostasis, tissue re-approximation and closure of the incision site.  ? ?ANESTHESIA:  General.  ? ?COMPLICATIONS: None.  ? ?INDICATIONS FOR PROCEDURE:  ?The patient, Anna Dennis is a 31 y.o. female born on [birth year], is here for treatment of left arm wound ?MRN: [id number] ? ?CONSENT:  ?Informed consent was obtained directly from the patient. Risks, benefits and alternatives were fully discussed. Specific risks including but not limited to bleeding, infection, hematoma, seroma, scarring, pain, contracture, asymmetry, wound healing problems, and need for further surgery were all discussed. The patient did have an ample opportunity to have questions answered to satisfaction.  ? ?DESCRIPTION OF PROCEDURE:  ?The patient was taken to the operating room. SCDs were placed and antibiotics were given.  General anesthesia was administered.  The patient's operative site was prepped and draped in a sterile fashion. A time out was performed and all information was confirmed to be correct.  Started by evaluating the wound of the left upper arm.  There was granulation tissue present which looked healthy.  This was debrided with a bevel of a 10 blade to get rid of any colonized granulation tissue.  Hemostasis was obtained with cautery.  Wound was  irrigated copiously.  I then turned my attention to the left thigh.  A corresponding sized area is marked out with a marking pen.  Tumescent solution was infiltrated to help with pain control and hemostasis.  This was given time to work.  Split-thickness skin graft was then harvested at 14/1000th inch with a dermatome.  This was meshed 1.5-1 and inset in the left arm with staples.  A bolster was fashioned with Xeroform, scrub brush sponge, and 3-0 nylon.  Xeroform was used to dress the left thigh.  The left arm was wrapped in a soft dressing. ? ?The patient tolerated the procedure well.  There were no complications. The patient was allowed to wake from anesthesia, extubated and taken to the recovery room in satisfactory condition.  ? ?

## 2021-04-16 NOTE — Progress Notes (Signed)
Patient was in considerable pain and discomfort in PACU requiring IV analgesics.  Will discharge patient home with 5 mg oxycodone as well as a prescription of antiemetics.  She understands to first take ibuprofen and Tylenol, will only take the narcotic pain medication for breakthrough pain. Will call it into [facility] on [facility], as we discussed at bedside. ?

## 2021-04-16 NOTE — Anesthesia Procedure Notes (Signed)
Procedure Name: LMA Insertion ?Date/Time: 04/16/2021 8:17 AM ?Performed by: Colin Benton, CRNA ?Pre-anesthesia Checklist: Patient identified, Emergency Drugs available, Suction available and Patient being monitored ?Patient Re-evaluated:Patient Re-evaluated prior to induction ?Oxygen Delivery Method: Circle System Utilized ?Preoxygenation: Pre-oxygenation with 100% oxygen ?Induction Type: Combination inhalational/ intravenous induction ?Ventilation: Mask ventilation without difficulty ?LMA: LMA inserted ?LMA Size: 4.0 ?Number of attempts: 1 ?Placement Confirmation: positive ETCO2 ?Tube secured with: Tape ?Dental Injury: Teeth and Oropharynx as per pre-operative assessment  ? ? ? ? ?

## 2021-04-16 NOTE — Anesthesia Preprocedure Evaluation (Signed)
Anesthesia Evaluation  ?Patient identified by MRN, date of birth, ID band ?Patient awake ? ? ? ?Reviewed: ?Allergy & Precautions, NPO status , Patient's Chart, lab work & pertinent test results ? ?History of Anesthesia Complications ?Negative for: history of anesthetic complications ? ?Airway ?Mallampati: I ? ?TM Distance: >3 FB ?Neck ROM: Full ? ? ? Dental ? ?(+) Missing, Dental Advisory Given ?  ?Pulmonary ?Current Smoker and Patient abstained from smoking.,  ?08/27/2020 SARS coronavirus NEG ?  ?breath sounds clear to auscultation ? ? ? ? ? ? Cardiovascular ? ?Rhythm:Regular Rate:Tachycardia ? ?07/11/2020 ECHO: EF 70-75%, hyperdynamic LVF with no regional abnormalities, no significant valvular abnormalities ?  ?Neuro/Psych ?Anxiety Depression negative neurological ROS ?   ? GI/Hepatic ?GERD  Controlled,(+)  ?  ? 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 Renal/GU ?Renal InsufficiencyRenal disease  ? ?  ?Musculoskeletal ? ?(+) narcotic dependent ? Abdominal ?  ?Peds ? Hematology ?negative hematology ROS ?(+)   ?Anesthesia Other Findings ? ? Reproductive/Obstetrics ?Depo ? ? ?  ? ? ? ? ? ? ? ? ? ? ? ? ? ?  ?  ? ? ? ? ? ? ? ? ?Anesthesia Physical ? ?Anesthesia Plan ? ?ASA: 3 ? ?Anesthesia Plan: General  ? ?Post-op Pain Management:   ? ?Induction: Intravenous ? ?PONV Risk Score and Plan: 2 and Ondansetron, Midazolam and Treatment may vary due to age or medical condition ? ?Airway Management Planned: LMA ? ?Additional Equipment: None ? ?Intra-op Plan:  ? ?Post-operative Plan: Extubation in OR ? ?Informed Consent: I have reviewed the patients History and Physical, chart, labs and discussed the procedure including the risks, benefits and alternatives for the proposed anesthesia with the patient or authorized representative who has indicated his/her understanding and acceptance.  ? ? ? ?Dental advisory  given ? ?Plan Discussed with: CRNA and Surgeon ? ?Anesthesia Plan Comments:   ? ? ? ? ? ? ?Anesthesia Quick Evaluation ? ?

## 2021-04-16 NOTE — Discharge Instructions (Addendum)
Activity: As tolerated, but avoid strenuous activity until follow up visit. ? ?Diet: Regular ? ?Wound Care: We harvested skin graft from your left thigh and placed it on your chronic upper arm wound.  There is a yellow sponge bolster underneath the Ace wrap, Kerlix, ABD pad, and gauze. This has been secured with stitches.  Please leave this in place and keep it DRY! The gauze, ABD pad, Kerlix, and Ace wrap can be changed as needed.  As for your left thigh, please dress with Vaseline followed by gauze, ABD pad, and secure with Medipore or paper tape (can obtain over-the-counter at pharmacy).   ? ?Special Instructions: ? ?Call our office if any unusual problems occur such as pain, excessive ?bleeding, unrelieved nausea/vomiting, fever &/or chills. ? ?Follow-up appointment: Scheduled for next week. ? ?

## 2021-04-16 NOTE — Interval H&P Note (Signed)
Patient seen in examine. Risks and benefits discussed. Proceed with surgery.

## 2021-04-16 NOTE — Anesthesia Postprocedure Evaluation (Signed)
Anesthesia Post Note ? ?Patient: Anna Dennis ? ?Procedure(s) Performed: SKIN GRAFT SPLIT THICKNESS FROM LEFT THIGH TO LEFT UPPER ARM (Left: Arm Upper) ? ?  ? ?Patient location during evaluation: PACU ?Anesthesia Type: General ?Level of consciousness: awake and alert ?Pain management: pain level controlled ?Vital Signs Assessment: post-procedure vital signs reviewed and stable ?Respiratory status: spontaneous breathing, nonlabored ventilation and respiratory function stable ?Cardiovascular status: blood pressure returned to baseline and stable ?Postop Assessment: no apparent nausea or vomiting ?Anesthetic complications: no ? ? ?No notable events documented. ? ?Last Vitals:  ?Vitals:  ? 04/16/21 0955 04/16/21 1010  ?BP:  (!) 136/99  ?Pulse: 85 84  ?Resp: 10 15  ?Temp:    ?SpO2: 94% 97%  ?  ?Last Pain:  ?Vitals:  ? 04/16/21 1010  ?TempSrc:   ?PainSc: 10-Worst pain ever  ? ? ?  ?  ?  ?  ?  ?  ? ?Lynda Rainwater ? ? ? ? ?

## 2021-04-16 NOTE — Transfer of Care (Signed)
Immediate Anesthesia Transfer of Care Note ? ?Patient: Maysa Lynn ? ?Procedure(s) Performed: SKIN GRAFT SPLIT THICKNESS FROM LEFT THIGH TO LEFT UPPER ARM (Left: Arm Upper) ? ?Patient Location: PACU ? ?Anesthesia Type:General ? ?Level of Consciousness: Drowsy, eyes closed sleeping.  ? ?Airway & Oxygen Therapy: Patient Spontanous Breathing and Patient connected to face mask oxygen ? ?Post-op Assessment: Report given to RN and Post -op Vital signs reviewed and stable ? ?Post vital signs: Reviewed and stable ? ?Last Vitals:  ?Vitals Value Taken Time  ?BP 105/68 04/16/21 0912  ?Temp    ?Pulse 69 04/16/21 0914  ?Resp 9 04/16/21 0914  ?SpO2 100 % 04/16/21 0914  ?Vitals shown include unvalidated device data. ? ?Last Pain:  ?Vitals:  ? 04/16/21 0738  ?TempSrc:   ?PainSc: 3   ?   ? ?Patients Stated Pain Goal: 3 (04/16/21 3976) ? ?Complications: No notable events documented. ?

## 2021-04-17 ENCOUNTER — Encounter (HOSPITAL_COMMUNITY): Payer: Self-pay | Admitting: Plastic Surgery

## 2021-04-25 ENCOUNTER — Other Ambulatory Visit: Payer: Self-pay

## 2021-04-25 ENCOUNTER — Ambulatory Visit (INDEPENDENT_AMBULATORY_CARE_PROVIDER_SITE_OTHER): Payer: Self-pay | Admitting: Plastic Surgery

## 2021-04-25 DIAGNOSIS — S41102S Unspecified open wound of left upper arm, sequela: Secondary | ICD-10-CM

## 2021-04-25 NOTE — Progress Notes (Signed)
Patient presents 1 week out from split-thickness skin graft.  She overall feels like she is doing well.  Left upper arm bolster was removed revealing adherence of about 75% of the graft.  All the staples were removed.  Donor site looks to be healing fine and the Xeroform was left in place to gradually lift off.  I cautioned her to be careful with the graft site and avoid any shear but I am hopeful that the graft will remain adhered.  We will plan to see her again in 2 weeks to check her progress.  All of her questions were answered. ?

## 2021-04-25 NOTE — Addendum Note (Signed)
Addended by: Sherol Dade on: 04/25/2021 12:03 PM ? ? Modules accepted: Orders ? ?

## 2021-05-07 NOTE — Progress Notes (Unsigned)
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 Claudia Desanctis who presents clinic for postoperative follow-up. ? ?She was seen here for her initial postoperative exam on 04/25/2021.  At that time, bolster was removed and there was adherence of approximately 75% of the graft.  Staples were removed.  Donor site appeared to be healing fine.  Xeroform placed on graft site and she was encouraged to mitigate risk of shearing injuries in hopes that the graft can completely adhere. ? ?Today, ?

## 2021-05-09 ENCOUNTER — Ambulatory Visit: Payer: Self-pay | Admitting: Physician Assistant

## 2021-05-09 DIAGNOSIS — S41102S Unspecified open wound of left upper arm, sequela: Secondary | ICD-10-CM

## 2021-09-16 ENCOUNTER — Other Ambulatory Visit: Payer: Self-pay

## 2021-09-16 ENCOUNTER — Emergency Department (HOSPITAL_COMMUNITY): Payer: Commercial Managed Care - HMO

## 2021-09-16 ENCOUNTER — Emergency Department (HOSPITAL_COMMUNITY)
Admission: EM | Admit: 2021-09-16 | Discharge: 2021-09-17 | Disposition: A | Discharge status: Home / Self Care | Payer: Commercial Managed Care - HMO | Attending: Emergency Medicine | Admitting: Emergency Medicine

## 2021-09-16 DIAGNOSIS — E1165 Type 2 diabetes mellitus with hyperglycemia: Secondary | ICD-10-CM | POA: Insufficient documentation

## 2021-09-16 DIAGNOSIS — L03114 Cellulitis of left upper limb: Secondary | ICD-10-CM | POA: Insufficient documentation

## 2021-09-16 DIAGNOSIS — L02211 Cutaneous abscess of abdominal wall: Secondary | ICD-10-CM | POA: Insufficient documentation

## 2021-09-16 DIAGNOSIS — Z794 Long term (current) use of insulin: Secondary | ICD-10-CM | POA: Diagnosis not present

## 2021-09-16 DIAGNOSIS — M7989 Other specified soft tissue disorders: Secondary | ICD-10-CM | POA: Diagnosis present

## 2021-09-16 DIAGNOSIS — L03113 Cellulitis of right upper limb: Secondary | ICD-10-CM | POA: Diagnosis not present

## 2021-09-16 DIAGNOSIS — L03119 Cellulitis of unspecified part of limb: Secondary | ICD-10-CM

## 2021-09-16 LAB — CBC WITH DIFFERENTIAL/PLATELET
Abs Immature Granulocytes: 0.01 10*3/uL (ref 0.00–0.07)
Basophils Absolute: 0 10*3/uL (ref 0.0–0.1)
Basophils Relative: 1 %
Eosinophils Absolute: 0.1 10*3/uL (ref 0.0–0.5)
Eosinophils Relative: 2 %
HCT: 38.3 % (ref 36.0–46.0)
Hemoglobin: 12.6 g/dL (ref 12.0–15.0)
Immature Granulocytes: 0 %
Lymphocytes Relative: 39 %
Lymphs Abs: 2 10*3/uL (ref 0.7–4.0)
MCH: 28.6 pg (ref 26.0–34.0)
MCHC: 32.9 g/dL (ref 30.0–36.0)
MCV: 86.8 fL (ref 80.0–100.0)
Monocytes Absolute: 0.5 10*3/uL (ref 0.1–1.0)
Monocytes Relative: 9 %
Neutro Abs: 2.5 10*3/uL (ref 1.7–7.7)
Neutrophils Relative %: 49 %
Platelets: 314 10*3/uL (ref 150–400)
RBC: 4.41 MIL/uL (ref 3.87–5.11)
RDW: 13.2 % (ref 11.5–15.5)
WBC: 5.1 10*3/uL (ref 4.0–10.5)
nRBC: 0 % (ref 0.0–0.2)

## 2021-09-16 LAB — CBG MONITORING, ED: Glucose-Capillary: 561 mg/dL (ref 70–99)

## 2021-09-16 LAB — LACTIC ACID, PLASMA: Lactic Acid, Venous: 1.1 mmol/L (ref 0.5–1.9)

## 2021-09-16 NOTE — ED Provider Triage Note (Cosign Needed Addendum)
Emergency Medicine Provider Triage Evaluation Note  Glenn Christo , a 31 y.o. female  was evaluated in triage.  Pt complains of pain and redness to 1st MCP joint of L hand. Also with some pain but no redness of the 1st MCP joint of the R hand. 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 Noticed some progressive redness over the last day or two. No drainage. Denies known fevers. Last IVDU this AM. Patient is also an insulin dependent diabetic.  Review of Systems  Positive: As above Negative: As above  Physical Exam  BP (!) 149/99 (BP Location: Right Arm)   Pulse (!) 102   Temp 99.9 F (37.7 C) (Oral)   Resp 20   SpO2 97%  Gen:   Awake, no distress   Resp:  Normal effort  MSK:   Moves extremities without difficulty  Other:  Lungs CTAB. Heart rate tachycardic. Systolic murmur noted. Redness of the left 1st MCP joint without fluctuance. Full ROM of all digits of b/l hands.  Medical Decision Making  Medically screening exam initiated at 11:03 PM.  Appropriate orders placed.  [name] was informed that the remainder of the evaluation will be completed by another provider, this initial triage assessment does not replace that evaluation, and the importance of remaining in the ED until their evaluation is complete.  L hand pain and redness - hx of IVDU. Physical exam w/o concern for drainable abscess. Likely early cellulitis. However, concern for bacteremia given IVDU, tachycardia, low grade temp, heart murmur.   Labs and blood cultures initiated.    Antony Madura, PA-C 09/16/21 2307    Antony Madura, PA-C 09/16/21 2345

## 2021-09-16 NOTE — ED Triage Notes (Signed)
Pt here for bilateral hand swelling, believes she has abscesses forming. Pt reports she had an abscess on her abd a few days ago. Pt endorses using IV heroin today.

## 2021-09-17 ENCOUNTER — Other Ambulatory Visit (HOSPITAL_COMMUNITY): Payer: Self-pay

## 2021-09-17 LAB — BASIC METABOLIC PANEL
Anion gap: 10 (ref 5–15)
BUN: 9 mg/dL (ref 6–20)
CO2: 24 mmol/L (ref 22–32)
Calcium: 8.8 mg/dL — ABNORMAL LOW (ref 8.9–10.3)
Chloride: 97 mmol/L — ABNORMAL LOW (ref 98–111)
Creatinine, Ser: 0.79 mg/dL (ref 0.44–1.00)
GFR, Estimated: >60 mL/min (ref >60)
Glucose, Bld: 562 mg/dL (ref 70–99)
Potassium: 4.1 mmol/L (ref 3.5–5.1)
Sodium: 131 mmol/L — ABNORMAL LOW (ref 135–145)

## 2021-09-17 LAB — CBG MONITORING, ED
Glucose-Capillary: >600 mg/dL (ref 70–99)
Glucose-Capillary: 298 mg/dL — ABNORMAL HIGH (ref 70–99)

## 2021-09-17 MED ORDER — CEPHALEXIN 250 MG PO CAPS
500.0000 mg | ORAL_CAPSULE | Freq: Once | ORAL | Status: AC
Start: 2021-09-17 — End: 2021-09-17
  Administered 2021-09-17: 500 mg via ORAL
  Filled 2021-09-17: qty 2

## 2021-09-17 MED ORDER — INSULIN ASPART 100 UNIT/ML IJ SOLN
10.0000 [IU] | Freq: Once | INTRAMUSCULAR | Status: AC
  Administered 2021-09-17: 10 [IU] via SUBCUTANEOUS

## 2021-09-17 MED ORDER — DOXYCYCLINE HYCLATE 100 MG PO TABS
100.0000 mg | ORAL_TABLET | Freq: Once | ORAL | Status: AC
  Administered 2021-09-17: 100 mg via ORAL
  Filled 2021-09-17: qty 1

## 2021-09-17 MED ORDER — DOXYCYCLINE HYCLATE 100 MG PO CAPS
100.0000 mg | ORAL_CAPSULE | Freq: Two times a day (BID) | ORAL | 0 refills | Status: DC
  Filled 2021-09-17: qty 14, 7d supply, fill #0

## 2021-09-17 MED ORDER — CEPHALEXIN 500 MG PO CAPS
500.0000 mg | ORAL_CAPSULE | Freq: Four times a day (QID) | ORAL | 0 refills | Status: DC
  Filled 2021-09-17: qty 28, 7d supply, fill #0

## 2021-09-17 NOTE — ED Provider Notes (Signed)
[facility] EMERGENCY DEPARTMENT Provider Note   CSN: [id number] Arrival date & time: 09/16/21  2137     History  Chief Complaint  Patient presents with   Hand Injury   Abscess    Anna Dennis is a 31 y.o. female.  HPI   Patient with history of diabetes, history of IV drug use presents with concern for abscess.  Patient reports she has had an area of erythema to her abdomen for several weeks that she thinks is an abscess.  She reports she does give herself insulin in her abdomen with may have triggered this.  She also reports she is having swelling to both hands and she is concerned about an abscess there.  She reports she does inject heroin into her hands. No known fevers or vomiting.  No other acute complaints  Home Medications Prior to Admission medications   Medication Sig Start Date End Date Taking? Authorizing Provider  cephALEXin (KEFLEX) 500 MG capsule Take 1 capsule (500 mg total) by mouth 4 (four) times daily. 09/17/21  Yes Ripley Fraise, MD  doxycycline (VIBRAMYCIN) 100 MG capsule Take 1 capsule (100 mg total) by mouth 2 (two) times daily. One po bid x 7 days 09/17/21  Yes Ripley Fraise, MD  Accu-Chek Softclix Lancets lancets Use as directed. 07/14/20   Little Ishikawa, MD  blood glucose meter kit and supplies KIT Dispense based on patient and insurance preference. Use up to four times daily as directed. 07/14/20   Little Ishikawa, MD  glucose blood (GLUCOSE METER TEST) test strip Use as instructed 07/14/20   Little Ishikawa, MD  ibuprofen (ADVIL) 600 MG tablet Take 1 tablet (600 mg total) by mouth every 6 (six) hours as needed (pain). 08/31/20   Patrecia Pour, MD  insulin glargine (LANTUS SOLOSTAR) 100 UNIT/ML Solostar Pen Inject 50 Units into the skin at bedtime.    [provider]  insulin lispro (HUMALOG) 100 UNIT/ML injection Inject 1-12 Units into the skin 3 (three) times daily before meals. Per sliding scale    [provider]  insulin regular (NOVOLIN R) 100 units/mL injection Inject 50 Units into the skin at bedtime.    [provider]  ondansetron (ZOFRAN-ODT) 4 MG disintegrating tablet Take 1 tablet (4 mg total) by mouth every 8 (eight) hours as needed for nausea or vomiting. 04/16/21   Corena Herter, PA-C  insulin aspart (NOVOLOG) 100 UNIT/ML injection Inject 6 Units into the skin 3 (three) times daily with meals. 02/22/12 02/28/12  Reyne Dumas, MD  Omeprazole 20 MG TBEC Take 1 tablet (20 mg total) by mouth daily. 02/28/12 03/04/12  Murlean Iba, MD      Allergies    Patient has no known allergies.    Review of Systems   Review of Systems  Constitutional:  Negative for fever.  Genitourinary:        Currently on menstrual cycle  Musculoskeletal:  Positive for arthralgias.    Physical Exam Updated Vital Signs BP 126/87   Pulse 89   Temp 98.4 F (36.9 C) (Oral)   Resp 16   SpO2 99%  Physical Exam CONSTITUTIONAL: Mildly disheveled with no acute distress HEAD: Normocephalic/atraumatic EYES: EOMI/PERRL, no conjunctival lesions ENMT: Mucous membranes moist NECK: supple no meningeal signs CV: S1/S2 noted, murmur noted (chronic per patient) LUNGS: Lungs are clear to auscultation bilaterally, no apparent distress ABDOMEN: soft, nontender, area of erythema to the abdominal wall.  No crepitus, no drainage, minimal tenderness,  see photo GU:no cva tenderness NEURO: Pt is awake/alert/appropriate, moves all extremitiesx4.  No facial droop.   EXTREMITIES: pulses normal/equal, full ROM Mild erythema noted to both hands.  No fluctuant abscess.  No crepitus.  Minimal tenderness is noted.  She has full range of motion of both wrists fully flex and extend all fingers No new lesions are noted to her forearms. SKIN: warm, color normal, see photos below PSYCH: no abnormalities of mood noted, alert and oriented to situation        ED Results / Procedures / Treatments   Labs (all  labs ordered are listed, but only abnormal results are displayed) Labs Reviewed  BASIC METABOLIC PANEL - Abnormal; Notable for the following components:      Result Value   Sodium 131 (*)    Chloride 97 (*)    Glucose, Bld 562 (*)    Calcium 8.8 (*)    All other components within normal limits  CBG MONITORING, ED - Abnormal; Notable for the following components:   Glucose-Capillary 561 (*)    All other components within normal limits  CBG MONITORING, ED - Abnormal; Notable for the following components:   Glucose-Capillary >600 (*)    All other components within normal limits  CBG MONITORING, ED - Abnormal; Notable for the following components:   Glucose-Capillary 298 (*)    All other components within normal limits  CULTURE, BLOOD (ROUTINE X 2)  CBC WITH DIFFERENTIAL/PLATELET  LACTIC ACID, PLASMA    EKG None  Radiology DG Finger Thumb Left  Result Date: 09/16/2021 CLINICAL DATA:  Left thumb pain and redness. EXAM: LEFT THUMB 2+V COMPARISON:  Left hand radiograph dated 08/27/2020. FINDINGS: There is no evidence of fracture or dislocation. There is no evidence of arthropathy or other focal bone abnormality. Soft tissues are unremarkable. IMPRESSION: Negative. Electronically Signed   By: Anner Crete M.D.   On: 09/16/2021 23:42    Procedures Procedures    Medications Ordered in ED Medications  doxycycline (VIBRA-TABS) tablet 100 mg (100 mg Oral Given 09/17/21 0242)  cephALEXin (KEFLEX) capsule 500 mg (500 mg Oral Given 09/17/21 0242)  insulin aspart (novoLOG) injection 10 Units (10 Units Subcutaneous Given 09/17/21 0256)    ED Course/ Medical Decision Making/ A&P Clinical Course as of 09/17/21 0441  Mon Sep 17, 2021  0210 Glucose-Capillary(!!): 561 Hyperglycemia noted [DW]  0229 Patient is overall well-appearing.  She is in no acute distress.  She is eating snacks and drinking water without difficulty.  She is not septic appearing.  She is appropriate for outpatient  management with oral antibiotics and monitoring for any worsening [DW]  0440 Glucose is improved.  Patient be discharged home.  She is safe for outpatient management.  We discussed strict return precautions.  She will be on dual antibiotic therapy [DW]    Clinical Course User Index [DW] Ripley Fraise, MD                           Medical Decision Making Amount and/or Complexity of Data Reviewed Labs:  Decision-making details documented in ED Course.  Risk Prescription drug management.   This patient presents to the ED for concern of hand pain, this involves an extensive number of treatment options, and is a complaint that carries with it a high risk of complications and morbidity.  The differential diagnosis includes but is not limited to cellulitis, abscess, retained foreign body, necrotizing fasciitis  Comorbidities that complicate the patient  evaluation: Patient's presentation is complicated by their history of diabetes  Social Determinants of Health: Patient's lack of insurance, impaired access to primary care, and substance use disorder   increases the complexity of managing their presentation  Additional history obtained: Records reviewed previous admission documents  Lab Tests: I Ordered, and personally interpreted labs.  The pertinent results include: Hyperglycemia, no anion gap  Imaging Studies ordered: I ordered imaging studies including X-ray left thumb   I independently visualized and interpreted imaging which showed no acute findings I agree with the radiologist interpretation   Medicines ordered and prescription drug management: I ordered medication including Keflex and Doxy-for cellulitis    Critical Interventions:  oral antibiotics   Reevaluation: After the interventions noted above, I reevaluated the patient and found that they have :improved  Complexity of problems addressed: Patient's presentation is most consistent with  acute presentation with  potential threat to life or bodily function  Disposition: After consideration of the diagnostic results and the patient's response to treatment,  I feel that the patent would benefit from discharge   .           Final Clinical Impression(s) / ED Diagnoses Final diagnoses:  Cellulitis of hand    Rx / DC Orders ED Discharge Orders          Ordered    cephALEXin (KEFLEX) 500 MG capsule  4 times daily        09/17/21 0233    doxycycline (VIBRAMYCIN) 100 MG capsule  2 times daily        09/17/21 0233              Ripley Fraise, MD 09/17/21 [id number]

## 2021-09-18 ENCOUNTER — Other Ambulatory Visit: Payer: Self-pay

## 2021-09-21 LAB — CULTURE, BLOOD (ROUTINE X 2)
Culture: NO GROWTH
Special Requests: ADEQUATE

## 2021-12-28 ENCOUNTER — Encounter (INDEPENDENT_AMBULATORY_CARE_PROVIDER_SITE_OTHER): Payer: Self-pay

## 2022-01-09 ENCOUNTER — Emergency Department (HOSPITAL_COMMUNITY)
Admission: EM | Admit: 2022-01-09 | Discharge: 2022-01-09 | Discharge status: Against Medical Advice | Payer: 59 | Attending: Emergency Medicine | Admitting: Emergency Medicine

## 2022-01-09 ENCOUNTER — Encounter (HOSPITAL_COMMUNITY): Payer: Self-pay

## 2022-01-09 ENCOUNTER — Other Ambulatory Visit: Payer: Self-pay

## 2022-01-09 DIAGNOSIS — R2231 Localized swelling, mass and lump, right upper limb: Secondary | ICD-10-CM | POA: Insufficient documentation

## 2022-01-09 DIAGNOSIS — Z5321 Procedure and treatment not carried out due to patient leaving prior to being seen by health care provider: Secondary | ICD-10-CM | POA: Insufficient documentation

## 2022-01-09 NOTE — ED Triage Notes (Signed)
Pt reports with right hand swelling and redness. Pt reports using iv heroin in that hand yesterday or the day before.

## 2022-01-09 NOTE — ED Notes (Signed)
Unable to obtain labs 

## 2022-01-10 ENCOUNTER — Encounter (HOSPITAL_COMMUNITY): Payer: Self-pay

## 2022-01-10 ENCOUNTER — Other Ambulatory Visit (HOSPITAL_COMMUNITY): Payer: Self-pay

## 2022-01-10 ENCOUNTER — Emergency Department (HOSPITAL_COMMUNITY): Payer: 59

## 2022-01-10 ENCOUNTER — Emergency Department (HOSPITAL_COMMUNITY)
Admission: EM | Admit: 2022-01-10 | Discharge: 2022-01-10 | Discharge status: Against Medical Advice | Payer: 59 | Attending: Emergency Medicine | Admitting: Emergency Medicine

## 2022-01-10 ENCOUNTER — Other Ambulatory Visit: Payer: Self-pay

## 2022-01-10 DIAGNOSIS — L02511 Cutaneous abscess of right hand: Secondary | ICD-10-CM | POA: Diagnosis present

## 2022-01-10 DIAGNOSIS — L03113 Cellulitis of right upper limb: Secondary | ICD-10-CM

## 2022-01-10 DIAGNOSIS — Z794 Long term (current) use of insulin: Secondary | ICD-10-CM | POA: Diagnosis not present

## 2022-01-10 DIAGNOSIS — E1165 Type 2 diabetes mellitus with hyperglycemia: Secondary | ICD-10-CM | POA: Insufficient documentation

## 2022-01-10 DIAGNOSIS — Z5329 Procedure and treatment not carried out because of patient's decision for other reasons: Secondary | ICD-10-CM | POA: Insufficient documentation

## 2022-01-10 LAB — CBC WITH DIFFERENTIAL/PLATELET
Abs Immature Granulocytes: 0.01 10*3/uL (ref 0.00–0.07)
Basophils Absolute: 0 10*3/uL (ref 0.0–0.1)
Basophils Relative: 1 %
Eosinophils Absolute: 0.2 10*3/uL (ref 0.0–0.5)
Eosinophils Relative: 3 %
HCT: 37.1 % (ref 36.0–46.0)
Hemoglobin: 11.8 g/dL — ABNORMAL LOW (ref 12.0–15.0)
Immature Granulocytes: 0 %
Lymphocytes Relative: 50 %
Lymphs Abs: 2.6 10*3/uL (ref 0.7–4.0)
MCH: 27.4 pg (ref 26.0–34.0)
MCHC: 31.8 g/dL (ref 30.0–36.0)
MCV: 86.3 fL (ref 80.0–100.0)
Monocytes Absolute: 0.5 10*3/uL (ref 0.1–1.0)
Monocytes Relative: 10 %
Neutro Abs: 1.9 10*3/uL (ref 1.7–7.7)
Neutrophils Relative %: 36 %
Platelets: 249 10*3/uL (ref 150–400)
RBC: 4.3 MIL/uL (ref 3.87–5.11)
RDW: 13.1 % (ref 11.5–15.5)
WBC: 5.2 10*3/uL (ref 4.0–10.5)
nRBC: 0 % (ref 0.0–0.2)

## 2022-01-10 LAB — CBG MONITORING, ED
Glucose-Capillary: 209 mg/dL — ABNORMAL HIGH (ref 70–99)
Glucose-Capillary: 209 mg/dL — ABNORMAL HIGH (ref 70–99)

## 2022-01-10 LAB — LACTIC ACID, PLASMA
Lactic Acid, Venous: 2.8 mmol/L (ref 0.5–1.9)
Lactic Acid, Venous: 1.7 mmol/L (ref 0.5–1.9)

## 2022-01-10 LAB — I-STAT BETA HCG BLOOD, ED (MC, WL, AP ONLY): I-stat hCG, quantitative: <5 m[IU]/mL (ref <5)

## 2022-01-10 LAB — BASIC METABOLIC PANEL
Anion gap: 8 (ref 5–15)
BUN: 13 mg/dL (ref 6–20)
CO2: 25 mmol/L (ref 22–32)
Calcium: 8.6 mg/dL — ABNORMAL LOW (ref 8.9–10.3)
Chloride: 101 mmol/L (ref 98–111)
Creatinine, Ser: 0.88 mg/dL (ref 0.44–1.00)
GFR, Estimated: >60 mL/min (ref >60)
Glucose, Bld: 408 mg/dL — ABNORMAL HIGH (ref 70–99)
Potassium: 3.8 mmol/L (ref 3.5–5.1)
Sodium: 134 mmol/L — ABNORMAL LOW (ref 135–145)

## 2022-01-10 MED ORDER — IBUPROFEN 200 MG PO TABS: 600.0000 mg | ORAL_TABLET | Freq: Once | ORAL | Status: DC

## 2022-01-10 MED ORDER — DOXYCYCLINE HYCLATE 100 MG PO CAPS: 100.0000 mg | ORAL_CAPSULE | Freq: Two times a day (BID) | ORAL | 0 refills | Status: DC

## 2022-01-10 MED ORDER — DOXYCYCLINE HYCLATE 100 MG PO CAPS
100.0000 mg | ORAL_CAPSULE | Freq: Two times a day (BID) | ORAL | 0 refills | Status: DC
  Filled 2022-01-10: qty 14, 7d supply, fill #0

## 2022-01-10 MED ORDER — SODIUM CHLORIDE 0.9 % IV SOLN
1.0000 g | Freq: Once | INTRAVENOUS | Status: AC
  Administered 2022-01-10: 1 g via INTRAVENOUS
  Filled 2022-01-10: qty 10

## 2022-01-10 MED ORDER — OXYCODONE-ACETAMINOPHEN 5-325 MG PO TABS: 1.0000 | ORAL_TABLET | Freq: Once | ORAL | Status: DC

## 2022-01-10 MED ORDER — ONDANSETRON HCL 4 MG/2ML IJ SOLN
4.0000 mg | Freq: Once | INTRAMUSCULAR | Status: AC
  Administered 2022-01-10: 4 mg via INTRAVENOUS
  Filled 2022-01-10: qty 2

## 2022-01-10 MED ORDER — HYDROMORPHONE HCL 1 MG/ML IJ SOLN
0.5000 mg | Freq: Once | INTRAMUSCULAR | Status: DC
  Administered 2022-01-10: 0.5 mg via INTRAVENOUS
  Filled 2022-01-10: qty 1

## 2022-01-10 MED ORDER — SODIUM CHLORIDE 0.9 % IV BOLUS
1000.0000 mL | Freq: Once | INTRAVENOUS | Status: AC
  Administered 2022-01-10: 1000 mL via INTRAVENOUS

## 2022-01-10 NOTE — ED Triage Notes (Signed)
Right hand redness and swelling x 3 days after iv heroin usage.

## 2022-01-10 NOTE — Discharge Instructions (Addendum)
You were given rocephin in the ED today. You will be sent with a prescription for doxycycline take as directed.  It is recommended that you are admitted to the hospital for your symptoms.  Return to the ED if you are experiencing increasing/worsening symptoms.

## 2022-01-10 NOTE — ED Notes (Signed)
CBG 209. 

## 2022-01-10 NOTE — ED Notes (Addendum)
Pt alert, NAD, calm, interactive, resps e/u, denies fever, CP or sob. R hand with redness, swelling, decreased ROM, and heat. No obvious wounds bleeding or drainage. Pt wants to speak with provider prior to initiating plan/orders.

## 2022-01-10 NOTE — ED Notes (Signed)
EDP at [facility] discussing admission. Given water per request.

## 2022-01-10 NOTE — ED Provider Notes (Signed)
[facility] DEPT Provider Note   CSN: [id number] Arrival date & time: 01/10/22  0326     History  Chief Complaint  Patient presents with   Abscess    Anna Dennis is a 31 y.o. female with a past medical history of diabetes, IV drug use who presents emergency department with concerns for right hand redness/swelling onset 3 days.  Notes that she used IV heroin to the area prior to the onset of her symptoms.  Has a history of similar symptoms.  No meds tried prior to arrival.  Denies fever, drainage, chest pain, shortness of breath.   The history is provided by the patient. No language interpreter was used.       Home Medications Prior to Admission medications   Medication Sig Start Date End Date Taking? Authorizing Provider  Accu-Chek Softclix Lancets lancets Use as directed. 07/14/20   Little Ishikawa, MD  blood glucose meter kit and supplies KIT Dispense based on patient and insurance preference. Use up to four times daily as directed. 07/14/20   Little Ishikawa, MD  cephALEXin (KEFLEX) 500 MG capsule Take 1 capsule (500 mg total) by mouth 4 (four) times daily. 09/17/21   Ripley Fraise, MD  doxycycline (VIBRAMYCIN) 100 MG capsule Take 1 capsule (100 mg total) by mouth 2 (two) times daily. 01/10/22   Livy Ross A, PA-C  glucose blood (GLUCOSE METER TEST) test strip Use as instructed 07/14/20   Little Ishikawa, MD  ibuprofen (ADVIL) 600 MG tablet Take 1 tablet (600 mg total) by mouth every 6 (six) hours as needed (pain). 08/31/20   Patrecia Pour, MD  insulin glargine (LANTUS SOLOSTAR) 100 UNIT/ML Solostar Pen Inject 50 Units into the skin at bedtime.    [provider]  insulin lispro (HUMALOG) 100 UNIT/ML injection Inject 1-12 Units into the skin 3 (three) times daily before meals. Per sliding scale    [provider]  insulin regular (NOVOLIN R) 100 units/mL injection Inject 50 Units into the skin at bedtime.     [provider]  ondansetron (ZOFRAN-ODT) 4 MG disintegrating tablet Take 1 tablet (4 mg total) by mouth every 8 (eight) hours as needed for nausea or vomiting. 04/16/21   Corena Herter, PA-C  insulin aspart (NOVOLOG) 100 UNIT/ML injection Inject 6 Units into the skin 3 (three) times daily with meals. 02/22/12 02/28/12  Reyne Dumas, MD  Omeprazole 20 MG TBEC Take 1 tablet (20 mg total) by mouth daily. 02/28/12 03/04/12  Murlean Iba, MD      Allergies    Patient has no known allergies.    Review of Systems   Review of Systems  All other systems reviewed and are negative.   Physical Exam Updated Vital Signs BP (!) 135/98   Pulse 94   Temp 98 F (36.7 C) (Oral)   Resp 17   SpO2 99%  Physical Exam Vitals and nursing note reviewed.  Constitutional:      General: She is not in acute distress.    Appearance: Normal appearance.  Eyes:     General: No scleral icterus.    Extraocular Movements: Extraocular movements intact.  Cardiovascular:     Rate and Rhythm: Normal rate.  Pulmonary:     Effort: Pulmonary effort is normal. No respiratory distress.  Abdominal:     Palpations: Abdomen is soft. There is no mass.     Tenderness: There is no abdominal tenderness.  Musculoskeletal:  General: Normal range of motion.     Cervical back: Neck supple.  Skin:    General: Skin is warm and dry.     Findings: No rash.  Neurological:     Mental Status: She is alert.     Sensory: Sensation is intact.     Motor: Motor function is intact.  Psychiatric:        Behavior: Behavior normal.     ED Results / Procedures / Treatments   Labs (all labs ordered are listed, but only abnormal results are displayed) Labs Reviewed  BASIC METABOLIC PANEL - Abnormal; Notable for the following components:      Result Value   Sodium 134 (*)    Glucose, Bld 408 (*)    Calcium 8.6 (*)    All other components within normal limits  CBC WITH DIFFERENTIAL/PLATELET - Abnormal; Notable  for the following components:   Hemoglobin 11.8 (*)    All other components within normal limits  LACTIC ACID, PLASMA - Abnormal; Notable for the following components:   Lactic Acid, Venous 2.8 (*)    All other components within normal limits  CBG MONITORING, ED - Abnormal; Notable for the following components:   Glucose-Capillary 209 (*)    All other components within normal limits  CBG MONITORING, ED - Abnormal; Notable for the following components:   Glucose-Capillary 209 (*)    All other components within normal limits  CULTURE, BLOOD (ROUTINE X 2)  CULTURE, BLOOD (ROUTINE X 2)  LACTIC ACID, PLASMA  I-STAT BETA HCG BLOOD, ED ([facility], [facility], [facility] ONLY)    EKG None  Radiology DG Hand Complete Right  Result Date: 01/10/2022 CLINICAL DATA:  Pain and swelling after IV drug use. EXAM: RIGHT HAND - COMPLETE 3+ VIEW COMPARISON:  05/16/2020 FINDINGS: Overlap of fingers on the lateral view. Soft tissue swelling is most prominent dorsally at the level of the metacarpals. No radiopaque foreign object. No acute fracture or dislocation. No focal osseous lesion. IMPRESSION: Soft tissue swelling, without acute osseous finding. Electronically Signed   By: Abigail Miyamoto M.D.   On: 01/10/2022 08:24    Procedures Procedures    Medications Ordered in ED Medications  ondansetron (ZOFRAN) injection 4 mg (4 mg Intravenous Given 01/10/22 0843)  cefTRIAXone (ROCEPHIN) 1 g in sodium chloride 0.9 % 100 mL IVPB (0 g Intravenous Stopped 01/10/22 0947)  sodium chloride 0.9 % bolus 1,000 mL (0 mLs Intravenous Stopped 01/10/22 1104)    ED Course/ Medical Decision Making/ A&P Clinical Course as of 01/10/22 1529  Thu Jan 10, 2022  0737 Notified by RN that patient would like to go to work today and is unaware of what the plan will be for her. Discussed with patient plan for labs and imaging. At this time, pt agreeable to stay for work up. Discussed with patient that she may need to be admitted to the hospital for IV  antibiotics, patient understanding at this time.  [SB]  [phone] Notified of elevated lactic at 2.8, IVF ordered. [SB]  [id number] Lactic Acid, Venous(!!): 2.8 [SB]  1005 Pt re-evaluated and noted improvement of symptoms with treatment regimen in the ED. In-depth conversation held with patient regarding plans for admission.  At this time patient [SB]  1031 Patient reevaluated.  At this time patient notes she has family at home that she is taking care of and she is unsure if she is able to stay in the emergency department today.  Patient notes that she would like to reassess  her options.  Discussed with patient that we we will be rechecking her lactic acid determine if she needs more fluids.  Patient agreeable at this time. [SB]  1248 Patient CBG at 209.  Again discussed with patient and recommended that patient to be admitted to the hospital due to her history of IV drug use as well and is now hand infection.  Again at this time patient declines admission and notes that she will be leaving AMA.  Discussed with patient implications of leaving AMA.  Patient aware and able to verbalize understanding of leaving AMA.  Discussed with patient we will send prescription for Doxy to her pharmacy.  However, again encourage patient that she does need to be admitted.  Patient notes that she will come back tonight to be admitted. [SB]    Clinical Course User Index [SB] Michalina Calbert A, PA-C                           Medical Decision Making Amount and/or Complexity of Data Reviewed Labs: ordered. Decision-making details documented in ED Course. Radiology: ordered.  Risk OTC drugs. Prescription drug management.   Pt presents with concerns for right hand pain/swelling onset 3 days.  Notes that she used IV heroin at the site prior to onset of her symptoms.  Has a history of similar symptoms.  No fever or drainage noted.  Patient afebrile.  On exam patient with erythema, increased warmth, swelling noted to right hand  diffusely.  Radial pulse intact.  Capillary refill less than 2 seconds.  Grip strength 5/5 bilaterally. No acute cardiovascular, respiratory, abdominal exam findings. Differential diagnosis includes cellulitis, abscess.    Labs:  I ordered, and personally interpreted labs.  The pertinent results include:   Initial lactic at 2.8, repeat lactic at 1.7 Blood cultures ordered results pending at time of patient leaving AMA Negative hCG CBC without leukocytosis BMP with elevated glucose of 408 CBG at 209 after IV fluids  Imaging: I ordered imaging studies including right hand x-ray I independently visualized and interpreted imaging which showed: No acute fracture dislocation, soft tissue swelling noted I agree with the radiologist interpretation  Medications:  I ordered medication including IV fluids, Rocephin, Zofran, Dilaudid Reevaluation of the patient after these medicines and interventions, I reevaluated the patient and found that they have improved I have reviewed the patients home medicines and have made adjustments as needed    Disposition: Presenting suspicious for cellulitis.  Doubt abscess at this time.  In-depth conversation held with patient regarding concerns that patient will need admission today.  See ED course for further information.  Ultimately, patient declines admission at this time and will leave AMA.  In-depth conversation held with patient regarding patient leaving AMA and implications of that may cause such as untimely death.  Patient aware of implications.  Patient notes that she will return to the emergency department tonight for admission.  Prescription sent to patient's pharmacy for doxycycline for patient to start taking.  Patient appears safe at this time and again prior to patient leaving AMA it was recommended that patient be admitted to the hospital to which patient declined   This chart was dictated using voice recognition software, Dragon. Despite the best  efforts of this provider to proofread and correct errors, errors may still occur which can change documentation meaning.   Final Clinical Impression(s) / ED Diagnoses Final diagnoses:  Cellulitis of right upper extremity    Rx /  DC Orders ED Discharge Orders          Ordered    doxycycline (VIBRAMYCIN) 100 MG capsule  2 times daily,   Status:  Discontinued        01/10/22 1249    doxycycline (VIBRAMYCIN) 100 MG capsule  2 times daily        01/10/22 1249              Theran Vandergrift A, PA-C 01/10/22 1529    Hayden Rasmussen, MD 01/10/22 2043

## 2022-01-11 ENCOUNTER — Encounter (HOSPITAL_COMMUNITY): Payer: Self-pay

## 2022-01-11 ENCOUNTER — Other Ambulatory Visit: Payer: Self-pay

## 2022-01-11 ENCOUNTER — Emergency Department (HOSPITAL_COMMUNITY)
Admission: EM | Admit: 2022-01-11 | Discharge: 2022-01-11 | Disposition: A | Discharge status: Home / Self Care | Payer: 59 | Attending: Emergency Medicine | Admitting: Emergency Medicine

## 2022-01-11 ENCOUNTER — Other Ambulatory Visit (HOSPITAL_COMMUNITY): Payer: Self-pay

## 2022-01-11 DIAGNOSIS — L03113 Cellulitis of right upper limb: Secondary | ICD-10-CM

## 2022-01-11 DIAGNOSIS — E1065 Type 1 diabetes mellitus with hyperglycemia: Secondary | ICD-10-CM | POA: Insufficient documentation

## 2022-01-11 DIAGNOSIS — Z794 Long term (current) use of insulin: Secondary | ICD-10-CM | POA: Diagnosis not present

## 2022-01-11 DIAGNOSIS — M7989 Other specified soft tissue disorders: Secondary | ICD-10-CM | POA: Diagnosis present

## 2022-01-11 DIAGNOSIS — R7989 Other specified abnormal findings of blood chemistry: Secondary | ICD-10-CM | POA: Insufficient documentation

## 2022-01-11 DIAGNOSIS — R739 Hyperglycemia, unspecified: Secondary | ICD-10-CM

## 2022-01-11 LAB — CBC
HCT: 37 % (ref 36.0–46.0)
Hemoglobin: 11.5 g/dL — ABNORMAL LOW (ref 12.0–15.0)
MCH: 27.4 pg (ref 26.0–34.0)
MCHC: 31.1 g/dL (ref 30.0–36.0)
MCV: 88.3 fL (ref 80.0–100.0)
Platelets: 324 10*3/uL (ref 150–400)
RBC: 4.19 MIL/uL (ref 3.87–5.11)
RDW: 13.3 % (ref 11.5–15.5)
WBC: 7.9 10*3/uL (ref 4.0–10.5)
nRBC: 0 % (ref 0.0–0.2)

## 2022-01-11 LAB — CBG MONITORING, ED: Glucose-Capillary: 322 mg/dL — ABNORMAL HIGH (ref 70–99)

## 2022-01-11 LAB — COMPREHENSIVE METABOLIC PANEL
ALT: 35 U/L (ref 0–44)
AST: 35 U/L (ref 15–41)
Albumin: 3.6 g/dL (ref 3.5–5.0)
Alkaline Phosphatase: 125 U/L (ref 38–126)
Anion gap: 7 (ref 5–15)
BUN: 17 mg/dL (ref 6–20)
CO2: 25 mmol/L (ref 22–32)
Calcium: 8.6 mg/dL — ABNORMAL LOW (ref 8.9–10.3)
Chloride: 101 mmol/L (ref 98–111)
Creatinine, Ser: 0.72 mg/dL (ref 0.44–1.00)
GFR, Estimated: >60 mL/min (ref >60)
Glucose, Bld: 282 mg/dL — ABNORMAL HIGH (ref 70–99)
Potassium: 3.8 mmol/L (ref 3.5–5.1)
Sodium: 133 mmol/L — ABNORMAL LOW (ref 135–145)
Total Bilirubin: 0.2 mg/dL — ABNORMAL LOW (ref 0.3–1.2)
Total Protein: 7.8 g/dL (ref 6.5–8.1)

## 2022-01-11 LAB — LACTIC ACID, PLASMA
Lactic Acid, Venous: 2.2 mmol/L (ref 0.5–1.9)
Lactic Acid, Venous: 1.6 mmol/L (ref 0.5–1.9)

## 2022-01-11 MED ORDER — MM PEN NEEDLES 32G X 4 MM MISC
0 refills | Status: DC
  Filled 2022-01-11 – 2022-02-18 (×3): qty 100, 25d supply, fill #0

## 2022-01-11 MED ORDER — DEXTROSE 5 % IV SOLN
1500.0000 mg | Freq: Once | INTRAVENOUS | Status: AC
  Administered 2022-01-11: 1500 mg via INTRAVENOUS
  Filled 2022-01-11: qty 75

## 2022-01-11 MED ORDER — KETOROLAC TROMETHAMINE 15 MG/ML IJ SOLN
15.0000 mg | Freq: Once | INTRAMUSCULAR | Status: AC
  Administered 2022-01-11: 15 mg via INTRAVENOUS
  Filled 2022-01-11: qty 1

## 2022-01-11 MED ORDER — BASAGLAR KWIKPEN 100 UNIT/ML ~~LOC~~ SOPN
50.0000 [IU] | PEN_INJECTOR | Freq: Every day | SUBCUTANEOUS | 0 refills | Status: DC
  Filled 2022-01-11 – 2022-02-18 (×3): qty 15, 30d supply, fill #0

## 2022-01-11 MED ORDER — LACTATED RINGERS IV BOLUS
1000.0000 mL | Freq: Once | INTRAVENOUS | Status: AC
  Administered 2022-01-11: 1000 mL via INTRAVENOUS

## 2022-01-11 MED ORDER — ACETAMINOPHEN 500 MG PO TABS
1000.0000 mg | ORAL_TABLET | Freq: Once | ORAL | Status: AC
  Administered 2022-01-11: 1000 mg via ORAL
  Filled 2022-01-11: qty 2

## 2022-01-11 MED ORDER — INSULIN LISPRO (1 UNIT DIAL) 100 UNIT/ML (KWIKPEN)
PEN_INJECTOR | SUBCUTANEOUS | 11 refills | Status: DC
  Filled 2022-01-11: qty 9, 25d supply, fill #0
  Filled 2022-02-06: qty 15, 42d supply, fill #0
  Filled 2022-02-18: qty 12, 33d supply, fill #0
  Filled 2022-04-24 – 2022-04-29 (×2): qty 12, 33d supply, fill #1
  Filled 2022-06-06: qty 12, 33d supply, fill #2
  Filled 2022-07-12: qty 12, 33d supply, fill #3
  Filled 2022-08-30: qty 12, 33d supply, fill #4
  Filled 2022-09-26 – 2022-10-17 (×2): qty 12, 33d supply, fill #5
  Filled 2022-11-08 – 2022-11-28 (×2): qty 12, 33d supply, fill #6
  Filled 2022-12-17 – 2023-01-07 (×3): qty 12, 33d supply, fill #7

## 2022-01-11 NOTE — ED Notes (Addendum)
Unable to transfer PT blood into tube for testing. Request to have [place] IV

## 2022-01-11 NOTE — ED Provider Triage Note (Signed)
Emergency Medicine Provider Triage Evaluation Note  Anna Dennis , a 31 y.o. female   She was here yesterday and admission was recommended for IV ABX for hand infection. Hx of IVDU she left AMA  Review of Systems  Positive: Hand pain Negative: Fever  Physical Exam  BP 127/88   Pulse (!) 102   Temp 98.7 F (37.1 C) (Oral)   Resp 18   LMP 01/05/2022 (Approximate)   SpO2 95%  Gen:   Awake, no distress   Resp:  Normal effort  MSK:   Moves extremities without difficulty  Other:    Medical Decision Making  Medically screening exam initiated at 5:43 AM.  Appropriate orders placed.  [name] was informed that the remainder of the evaluation will be completed by another provider, this initial triage assessment does not replace that evaluation, and the importance of remaining in the ED until their evaluation is complete.  Repeat labs    Gailen Shelter, [place] 01/11/22 [id number]

## 2022-01-11 NOTE — Discharge Instructions (Addendum)
You were seen in the emergency department for your hand swelling.  You do have a skin infection of your hand but you had no signs of severe infection.  Your blood sugar was also high here but you had no signs of complications from her blood sugar being high.  I have given you refills of your insulin.  We gave you a one-time dose of an antibiotic that should treat your infection.  You should follow-up with your primary doctor in the next few days to have your wound rechecked and your blood sugar rechecked.  You should return to the emergency department if you are having fevers despite the antibiotics, continued streaking redness up your arm, you have repetitive vomiting, or if you have any other new or concerning symptoms.

## 2022-01-11 NOTE — ED Provider Notes (Signed)
[facility] DEPT Provider Note   CSN: [id number] Arrival date & time: 01/11/22  0037     History  Chief Complaint  Patient presents with   Hand Swelling    Anna Dennis is a 31 y.o. female.  Patient is a 31 year old female with a past medical history of type 1 diabetes and opioid use disorder with IVDU presenting to the emergency department with hand pain.  The patient states that she has had pain and swelling in her right hand over the last 2 to 3 days.  She states she was seen in the emergency department yesterday and was recommended admission for IV antibiotics but had things to take care of and that after her noon so went home on oral antibiotics and return today she is now amenable to admission.  She states that she has injected in her right hand and that she last used around 6 PM last night.  She denies any fevers or chills, nausea, vomiting or abdominal pain.  Patient states that she is also been out of her insulin and so has not been able to take it as prescribed.  The history is provided by the patient.       Home Medications Prior to Admission medications   Medication Sig Start Date End Date Taking? Authorizing Provider  Insulin Glargine (BASAGLAR KWIKPEN) 100 UNIT/ML Inject 50 Units into the skin at bedtime. 01/11/22 02/10/22 Yes Maylon Peppers, Laymon Stockert K, DO  insulin lispro (HUMALOG) 100 UNIT/ML cartridge Inject 1-12 units into the skin three times daily before meals per sliding scale 01/11/22  Yes Maylon Peppers, [place] K, DO  Accu-Chek Softclix Lancets lancets Use as directed. 07/14/20   Little Ishikawa, MD  blood glucose meter kit and supplies KIT Dispense based on patient and insurance preference. Use up to four times daily as directed. 07/14/20   Little Ishikawa, MD  cephALEXin (KEFLEX) 500 MG capsule Take 1 capsule (500 mg total) by mouth 4 (four) times daily. 09/17/21   Ripley Fraise, MD  doxycycline (VIBRAMYCIN) 100 MG capsule Take  1 capsule (100 mg total) by mouth 2 (two) times daily. 01/10/22   Blue, [place] A, PA-C  glucose blood (GLUCOSE METER TEST) test strip Use as instructed 07/14/20   Little Ishikawa, MD  ibuprofen (ADVIL) 600 MG tablet Take 1 tablet (600 mg total) by mouth every 6 (six) hours as needed (pain). 08/31/20   Patrecia Pour, MD  insulin glargine (LANTUS SOLOSTAR) 100 UNIT/ML Solostar Pen Inject 50 Units into the skin at bedtime.    [provider]  insulin lispro (HUMALOG) 100 UNIT/ML injection Inject 1-12 Units into the skin 3 (three) times daily before meals. Per sliding scale    [provider]  insulin regular (NOVOLIN R) 100 units/mL injection Inject 50 Units into the skin at bedtime.    [provider]  ondansetron (ZOFRAN-ODT) 4 MG disintegrating tablet Take 1 tablet (4 mg total) by mouth every 8 (eight) hours as needed for nausea or vomiting. 04/16/21   Corena Herter, PA-C  insulin aspart (NOVOLOG) 100 UNIT/ML injection Inject 6 Units into the skin 3 (three) times daily with meals. 02/22/12 02/28/12  Reyne Dumas, MD  Omeprazole 20 MG TBEC Take 1 tablet (20 mg total) by mouth daily. 02/28/12 03/04/12  Murlean Iba, MD      Allergies    Patient has no known allergies.    Review of Systems   Review of Systems  Physical Exam Updated Vital Signs  BP (!) 131/95 (BP Location: Right Arm)   Pulse 85   Temp 97.7 F (36.5 C) (Oral)   Resp 16   Ht 5' 7" (1.702 m)   Wt 72.6 kg   LMP 01/05/2022 (Approximate)   SpO2 100%   BMI 25.06 kg/m  Physical Exam Vitals and nursing note reviewed.  Constitutional:      General: She is not in acute distress.    Appearance: Normal appearance.     Comments: Intermittently yawning on exam  HENT:     Head: Normocephalic and atraumatic.     Nose: Nose normal.     Mouth/Throat:     Mouth: Mucous membranes are moist.     Pharynx: Oropharynx is clear.  Eyes:     Extraocular Movements: Extraocular movements intact.      Conjunctiva/sclera: Conjunctivae normal.  Cardiovascular:     Rate and Rhythm: Normal rate and regular rhythm.     Pulses: Normal pulses.     Heart sounds: Normal heart sounds.  Pulmonary:     Effort: Pulmonary effort is normal.     Breath sounds: Normal breath sounds.  Abdominal:     General: Abdomen is flat.     Palpations: Abdomen is soft.     Tenderness: There is no abdominal tenderness.  Musculoskeletal:        General: Swelling (non-pitting edema to entire R hand with overlying erythma on dorsal aspect of hand, no open wounds, no fluctuance) present. No tenderness or deformity. Normal range of motion.     Cervical back: Normal range of motion and neck supple.  Skin:    General: Skin is warm and dry.  Neurological:     General: No focal deficit present.     Mental Status: She is alert and oriented to person, place, and time.     Sensory: No sensory deficit.     Motor: No weakness.  Psychiatric:        Mood and Affect: Mood normal.        Behavior: Behavior normal.     ED Results / Procedures / Treatments   Labs (all labs ordered are listed, but only abnormal results are displayed) Labs Reviewed  CBC - Abnormal; Notable for the following components:      Result Value   Hemoglobin 11.5 (*)    All other components within normal limits  COMPREHENSIVE METABOLIC PANEL - Abnormal; Notable for the following components:   Sodium 133 (*)    Glucose, Bld 282 (*)    Calcium 8.6 (*)    Total Bilirubin 0.2 (*)    All other components within normal limits  LACTIC ACID, PLASMA - Abnormal; Notable for the following components:   Lactic Acid, Venous 2.2 (*)    All other components within normal limits  CBG MONITORING, ED - Abnormal; Notable for the following components:   Glucose-Capillary 322 (*)    All other components within normal limits  LACTIC ACID, PLASMA    EKG None  Radiology DG Hand Complete Right  Result Date: 01/10/2022 CLINICAL DATA:  Pain and swelling after  IV drug use. EXAM: RIGHT HAND - COMPLETE 3+ VIEW COMPARISON:  05/16/2020 FINDINGS: Overlap of fingers on the lateral view. Soft tissue swelling is most prominent dorsally at the level of the metacarpals. No radiopaque foreign object. No acute fracture or dislocation. No focal osseous lesion. IMPRESSION: Soft tissue swelling, without acute osseous finding. Electronically Signed   By: Abigail Miyamoto M.D.   On: 01/10/2022 08:24  Procedures Procedures    Medications Ordered in ED Medications  dalbavancin (DALVANCE) 1,500 mg in dextrose 5 % 500 mL IVPB (has no administration in time range)  lactated ringers bolus 1,000 mL (1,000 mLs Intravenous New Bag/Given 01/11/22 0945)  acetaminophen (TYLENOL) tablet 1,000 mg (1,000 mg Oral Given 01/11/22 1113)  ketorolac (TORADOL) 15 MG/ML injection 15 mg (15 mg Intravenous Given 01/11/22 1114)    ED Course/ Medical Decision Making/ A&P Clinical Course as of 01/11/22 1128  Fri Jan 11, 2022  1037 Patient is hyperglycemic but without evidence of DKA.  Lactate is mildly elevated at 2.2 but she has no leukocytosis and otherwise normal vital signs making sepsis unlikely.  Blood cultures were drawn yesterday and have no growth to date.  Due to concern with patient's noncompliance with her IVDU, considering Dalvance for antibiotic therapy and pharmacy will be consulted. [VK]    Clinical Course User Index [VK] Kemper Durie, DO                           Medical Decision Making This patient presents to the ED with chief complaint(s) of hand swelling/pain with pertinent past medical history of DM, opioid use disorder with IVDU which further complicates the presenting complaint. The complaint involves an extensive differential diagnosis and also carries with it a high risk of complications and morbidity.    The differential diagnosis includes cellulitis, no no palpable fluctuance making abscess unlikely, no decreased ROM making deep space infection unlikely, no  bony tenderness and normal x-ray yesterday making osteomyelitis unlikely, hyperglycemia, DKA, sepsis  Additional history obtained: Additional history obtained from N/A Records reviewed recent ED records  ED Course and Reassessment: Patient was initially evaluated by provider in triage and had labs performed that showed hyperglycemia of 322.  She will have further lab evaluation to evaluate for DKA or signs of sepsis.  X-ray yesterday showed soft tissue swelling but no evidence of osteomyelitis or subcutaneous air.  Patient has full ROM of all digits of her right hand without significant tenderness making a deep space infection less likely.  Patient will be started on IV fluids for her hyperglycemia and will be closely reassessed.  Independent labs interpretation:  The following labs were independently interpreted: mild lactate increase, hyperglycemia, no evidence of DKA  Independent visualization of imaging: N/A  Consultation: - Consulted or discussed management/test interpretation w/ external professional: pharmacy  Consideration for admission or further workup: Patient has no emergent conditions requiring admission or further work-up at this time and is stable for discharge home with primary care follow-up  Social Determinants of health: IVDU/substance use disorder    Risk OTC drugs. Prescription drug management.          Final Clinical Impression(s) / ED Diagnoses Final diagnoses:  Cellulitis of hand, right  Hyperglycemia    Rx / DC Orders ED Discharge Orders          Ordered    Ambulatory referral to Infectious Disease       Comments: Cellulitis patient:  Received dalbavancin on 01/11/2022.   01/11/22 1115    Insulin Glargine (BASAGLAR KWIKPEN) 100 UNIT/ML  Daily at bedtime        01/11/22 1125    insulin lispro (HUMALOG) 100 UNIT/ML cartridge        01/11/22 1125              [place], [place], DO 01/11/22 1128

## 2022-01-11 NOTE — ED Triage Notes (Signed)
Right hand redness and swelling x 4 days after iv heroin usage.   Seen earlier today for same recommended admission for iv abx but left against medical advice.   Admits to snorting heroin ~6PM tonight.

## 2022-01-11 NOTE — Progress Notes (Signed)
Pharmacy Note:  Dalbavancin for Acute Bacterial Skin and Skin Structure Infection (ABSSSI) Patients to [facility] Discharge Anna Dennis is an 31 y.o. female who presented to [facility] on 01/11/2022 with an Acute Bacterial Skin and Skin Structure Infection  Inclusion criteria - Indication []  Moderately large skin lesion (>=75 cm2 or larger - about the size of a baseball) [x]  Cellulitis  Inclusion Criteria - at least one SIRS criteria present []  WBC > 12,000 or < 4000 []  temp >100.9 or < 96.8 []  heart rate >90[]  respiratory rate >20  Patient was evaluated for the following exclusion criteria: Hardware involvement, Hypotension / shock, Elevated lactate (>2) without other explanation, gram-negative infection risk factors (bites, water exposure, infection after trauma, infection after skin graft, neutropenia, burns, severe immunocompromise), necrotizing fasciitis possible or confirmed, Known or suspected osteomyelitis or septic arthritis, endocarditis, diabetic foot infection, ischemic ulcers, post-operative wound infection, perirectal infections, need for drainage in the operating room, hand or facial infections, injection drug users with a fever, bacteremia, pregnancy or breastfeeding, allergy to related antibiotics like vancomycin, known liver disease (t.bili >2x ULN or AST/ALT 3x ULN)  Patient meets exclusion criteria but after discussion with provider, benefits of giving medication outweigh risks so we are going to proceed  01/11/2022, 11:19 AM Clinical Pharmacist

## 2022-01-15 LAB — CULTURE, BLOOD (ROUTINE X 2)
Culture: NO GROWTH
Culture: NO GROWTH
Special Requests: ADEQUATE

## 2022-01-16 ENCOUNTER — Telehealth (HOSPITAL_BASED_OUTPATIENT_CLINIC_OR_DEPARTMENT_OTHER): Payer: Self-pay | Admitting: Emergency Medicine

## 2022-01-16 NOTE — Telephone Encounter (Signed)
Post ED Visit - Positive Culture Follow-up  Culture report reviewed by antimicrobial stewardship pharmacist: [facility] []  , Pharm.D. []  Enzo Bi, Pharm.D., BCPS AQ-ID []  , Pharm.D., BCPS []  Celedonio Miyamoto, Pharm.D., BCPS []  Trosky, Garvin Fila.D., BCPS, AAHIVP []  , Pharm.D., BCPS, AAHIVP []  Georgina Pillion, PharmD, BCPS []  , PharmD, BCPS []  [place], PharmD, BCPS []  [address], PharmD []  , PharmD, BCPS []  Estella Husk, PharmD  Pharmacy Team [x]  Lysle Pearl, PharmD []  , PharmD []  Phillips Climes, PharmD []  , Rph []  Agapito Games) , PharmD []  Verlan Friends, PharmD []  , PharmD []  Mervyn Gay, PharmD []  , PharmD []  Vinnie Level, PharmD []  [facility], PharmD []  , PharmD []  Len Childs, PharmD   Positive blood culture Treated with doxycycline and palbavance, organism sensitive to the same and no further patient follow-up is required at this time.  01/16/2022, 12:29 PM

## 2022-01-23 ENCOUNTER — Other Ambulatory Visit (HOSPITAL_COMMUNITY): Payer: Self-pay

## 2022-02-01 ENCOUNTER — Other Ambulatory Visit (HOSPITAL_COMMUNITY): Payer: Self-pay

## 2022-02-06 ENCOUNTER — Other Ambulatory Visit (HOSPITAL_COMMUNITY): Payer: Self-pay

## 2022-02-18 ENCOUNTER — Other Ambulatory Visit (HOSPITAL_COMMUNITY): Payer: Self-pay

## 2022-04-24 ENCOUNTER — Other Ambulatory Visit (HOSPITAL_COMMUNITY): Payer: Self-pay

## 2022-04-29 ENCOUNTER — Other Ambulatory Visit (HOSPITAL_COMMUNITY): Payer: Self-pay

## 2022-05-01 ENCOUNTER — Other Ambulatory Visit (HOSPITAL_COMMUNITY): Payer: Self-pay

## 2022-05-02 ENCOUNTER — Other Ambulatory Visit (HOSPITAL_COMMUNITY): Payer: Self-pay

## 2022-05-09 ENCOUNTER — Other Ambulatory Visit (HOSPITAL_COMMUNITY): Payer: Self-pay

## 2022-05-21 ENCOUNTER — Ambulatory Visit (HOSPITAL_COMMUNITY)
Admission: EM | Admit: 2022-05-21 | Discharge: 2022-05-21 | Disposition: A | Discharge status: Home / Self Care | Payer: Medicaid Other | Attending: Internal Medicine | Admitting: Internal Medicine

## 2022-05-21 ENCOUNTER — Other Ambulatory Visit (HOSPITAL_COMMUNITY): Payer: Self-pay

## 2022-05-21 ENCOUNTER — Encounter (HOSPITAL_COMMUNITY): Payer: Self-pay

## 2022-05-21 DIAGNOSIS — J40 Bronchitis, not specified as acute or chronic: Secondary | ICD-10-CM | POA: Diagnosis not present

## 2022-05-21 DIAGNOSIS — R0981 Nasal congestion: Secondary | ICD-10-CM | POA: Diagnosis not present

## 2022-05-21 LAB — POCT RAPID STREP A (OFFICE): Rapid Strep A Screen: NEGATIVE

## 2022-05-21 MED ORDER — ALBUTEROL SULFATE (2.5 MG/3ML) 0.083% IN NEBU
2.5000 mg | INHALATION_SOLUTION | Freq: Once | RESPIRATORY_TRACT | Status: AC
  Administered 2022-05-21: 2.5 mg via RESPIRATORY_TRACT

## 2022-05-21 MED ORDER — CLARITHROMYCIN 500 MG PO TABS
500.0000 mg | ORAL_TABLET | Freq: Two times a day (BID) | ORAL | 0 refills | Status: DC
  Filled 2022-05-21: qty 14, 7d supply, fill #0

## 2022-05-21 MED ORDER — DOXYCYCLINE HYCLATE 100 MG PO CAPS
100.0000 mg | ORAL_CAPSULE | Freq: Two times a day (BID) | ORAL | 0 refills | Status: DC
  Filled 2022-05-21: qty 14, 7d supply, fill #0

## 2022-05-21 MED ORDER — ALBUTEROL SULFATE HFA 108 (90 BASE) MCG/ACT IN AERS
2.0000 | INHALATION_SPRAY | RESPIRATORY_TRACT | 0 refills | Status: DC | PRN
  Filled 2022-05-21: qty 18, 25d supply, fill #0

## 2022-05-21 MED ORDER — ALBUTEROL SULFATE (2.5 MG/3ML) 0.083% IN NEBU
INHALATION_SOLUTION | RESPIRATORY_TRACT | Status: AC
  Filled 2022-05-21: qty 3

## 2022-05-21 NOTE — ED Provider Notes (Signed)
[facility]    CSN: [id number] Arrival date & time: 05/21/22  1504      History   Chief Complaint Chief Complaint  Patient presents with   Cough    HPI Anna Dennis is a 32 y.o. female who presents due to onset of cough, rhinitis and ST x 1 week. Has been taking Dayquil and nyquil at home which have not helped. Has been wheezing and coughing green mucous. She is a smoker. Had left over Doxy and took 5 days worth, but has not helped.  Denies HA or body aches.    Past Medical History:  Diagnosis Date   Depression    Diabetes mellitus    Insulin pump   GERD (gastroesophageal reflux disease)    Hepatitis C    Heroin abuse    Iron deficiency anemia    IVDU (intravenous drug user)    OCD (obsessive compulsive disorder)     Patient Active Problem List   Diagnosis Date Noted   Cellulitis of left upper extremity    Type 1 diabetes mellitus with hyperglycemia 08/27/2020   Sepsis 08/27/2020   Cellulitis of arm, left 08/27/2020   Necrotizing fasciitis 07/11/2020   Hyperkalemia 09/05/2015   Syncope 09/05/2015   Acute kidney injury (nontraumatic)    Heroin abuse (HCC)    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) 08/10/2014   Dehydration 02/20/2012   Sinus tachycardia 02/20/2012   Leukocytosis 02/19/2012   GERD (gastroesophageal reflux disease) 02/19/2012   DKA (diabetic ketoacidoses) (HCC) 01/19/2012   Type 1 diabetes mellitus with hyperosmolarity without nonketotic hyperglycemic hyperosmolar coma 03/14/2009   Depression 03/14/2009    Past Surgical History:  Procedure Laterality Date   I & D EXTREMITY Left 07/11/2020   Procedure: IRRIGATION AND DEBRIDEMENT EXTREMITY;  Surgeon: Terance Hart, MD;  Location: [facility] OR;  Service: Orthopedics;  Laterality: Left;   I & D EXTREMITY Left 08/27/2020   Procedure: IRRIGATION AND DEBRIDEMENT HAND;  Surgeon: Allena Napoleon, MD;  Location: [facility] OR;  Service: Plastics;  Laterality: Left;    SKIN SPLIT GRAFT Left 04/16/2021   Procedure: SKIN GRAFT SPLIT THICKNESS FROM LEFT THIGH TO LEFT UPPER ARM;  Surgeon: Allena Napoleon, MD;  Location: [facility] OR;  Service: Plastics;  Laterality: Left;    OB History   No obstetric history on file.      Home Medications    Prior to Admission medications   Medication Sig Start Date End Date Taking? Authorizing Provider  albuterol (VENTOLIN HFA) 108 (90 Base) MCG/ACT inhaler Inhale 2 puffs into the lungs every 4 (four) hours as needed for wheezing or shortness of breath. 05/21/22  Yes Rodriguez-Southworth, Nettie Elm, PA-C  clarithromycin (BIAXIN) 500 MG tablet Take 1 tablet (500 mg total) by mouth 2 (two) times daily. 05/21/22  Yes Rodriguez-Southworth, Nettie Elm, PA-C  glucose blood (GLUCOSE METER TEST) test strip Use as instructed 07/14/20   Azucena Fallen, MD  Insulin Glargine [facility]) 100 UNIT/ML Inject 50 Units into the skin at bedtime. 01/11/22 03/21/22  Elayne Snare K, DO  insulin glargine (LANTUS SOLOSTAR) 100 UNIT/ML Solostar Pen Inject 50 Units into the skin at bedtime.    [provider]  insulin lispro (HUMALOG KWIKPEN) 100 UNIT/ML KwikPen Inject 1-12 units into the skin three times daily before meals per sliding scale 01/11/22   Theresia Lo, [place] K, DO  insulin lispro (HUMALOG) 100 UNIT/ML injection Inject 1-12 Units into the skin 3 (three) times daily before meals.  Per sliding scale    [provider]  Insulin Pen Needle (MM PEN NEEDLES) 32G X 4 MM MISC To be used with insulin as instructed 01/11/22   Elayne Snare K, DO  insulin regular (NOVOLIN R) 100 units/mL injection Inject 50 Units into the skin at bedtime.    [provider]  ondansetron (ZOFRAN-ODT) 4 MG disintegrating tablet Take 1 tablet (4 mg total) by mouth every 8 (eight) hours as needed for nausea or vomiting. 04/16/21   Lorelee New, PA-C  insulin aspart (NOVOLOG) 100 UNIT/ML injection Inject 6 Units into the skin 3 (three)  times daily with meals. 02/22/12 02/28/12  Richarda Overlie, MD  Omeprazole 20 MG TBEC Take 1 tablet (20 mg total) by mouth daily. 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   Review of Systems Review of Systems  As noted in HPI  Physical Exam Triage Vital Signs ED Triage Vitals  Enc Vitals Group     BP 05/21/22 1635 98/65     Pulse Rate 05/21/22 1635 87     Resp 05/21/22 1635 16     Temp 05/21/22 1635 97.9 F (36.6 C)     Temp Source 05/21/22 1635 Oral     SpO2 05/21/22 1635 94 %     Weight --      Height --      Head Circumference --      Peak Flow --      Pain Score 05/21/22 1636 0     Pain Loc --      Pain Edu? --      Excl. in GC? --    No data found.  Updated Vital Signs BP 98/65 (BP Location: Left Arm)   Pulse 87   Temp 97.9 F (36.6 C) (Oral)   Resp 16   LMP  (LMP Unknown)   SpO2 94%  Repeated pulse ox after albuterol neg 98% Visual Acuity Right Eye Distance:   Left Eye Distance:   Bilateral Distance:    Right Eye Near:   Left Eye Near:    Bilateral Near:     Physical Exam Physical Exam Constitutional:      General: He is not in acute distress.    Appearance: He is not toxic-appearing.  HENT:     Head: Normocephalic.     Right Ear: Tympanic membrane, ear canal and external ear normal.     Left Ear: Ear canal and external ear normal.     Nose: Nose normal.     Mouth/Throat:     Mouth: Mucous membranes are moist.     Pharynx: Oropharynx is clear.  Eyes:     General: No scleral  icterus.    Conjunctiva/sclera: Conjunctivae normal.  Cardiovascular:     Rate and Rhythm: Normal rate and regular rhythm.     Heart sounds: No murmur heard.   Pulmonary:     Effort: Pulmonary  effort is normal. No respiratory distress.     Breath sounds: Wheezing present.     Comments: Has auditory wheezing Musculoskeletal:        General: Normal range of motion.     Cervical back: Neck supple.  Lymphadenopathy:     Cervical: No cervical adenopathy.  Skin:    General: Skin is warm and dry.     Findings: No rash.  Neurological:     Mental Status: He is alert and oriented to person, place, and time.     Gait: Gait normal.  Psychiatric:        Mood and Affect: Mood normal.        Behavior: Behavior normal.        Thought Content: Thought content normal.        Judgment: Judgment normal.    UC Treatments / Results  Labs (all labs ordered are listed, but only abnormal results are displayed) Labs Reviewed  POCT RAPID STREP A (OFFICE)    EKG   Radiology No results found.  Procedures Procedures (including critical care time)  Medications Ordered in UC Medications  albuterol (PROVENTIL) (2.5 MG/3ML) 0.083% nebulizer solution 2.5 mg (2.5 mg Nebulization Given 05/21/22 1758)    Initial Impression / Assessment and Plan / UC Course  I have reviewed the triage vital signs and the nursing notes. Albuterol neb treatment given and helped the wheezing resolve and her pulse ox improved to 98% Pertinent labs  results that were available during my care of the patient were reviewed by me and considered in my medical decision making (see chart for details).  Acute bronchitis  I placed her on Albuterol and Biaxen as noted   Final Clinical Impressions(s) / UC Diagnoses   Final diagnoses:  Bronchitis  Nose congestion   Discharge Instructions   None    ED Prescriptions     Medication Sig Dispense Auth. Provider   doxycycline (VIBRAMYCIN) 100 MG capsule  (Status:  Discontinued) Take 1 capsule (100 mg total) by mouth 2 (two) times daily. 14 capsule Rodriguez-Southworth, Nettie Elm, PA-C   albuterol (VENTOLIN HFA) 108 (90 Base) MCG/ACT inhaler Inhale 2 puffs into the lungs every 4 (four) hours as needed for wheezing or shortness of breath. 6.7 g Rodriguez-Southworth, Nettie Elm, PA-C   clarithromycin (BIAXIN) 500 MG tablet Take 1 tablet (500 mg total) by mouth 2 (two) times daily. 14 tablet Rodriguez-Southworth, Nettie Elm, PA-C      PDMP not reviewed this encounter.   Garey Ham, [place] 05/21/22 1810

## 2022-05-21 NOTE — ED Triage Notes (Signed)
Pt states cough,runny nose and sore throat for the past week.  Has been taking dayquil and nyquil at home.

## 2022-05-22 ENCOUNTER — Other Ambulatory Visit (HOSPITAL_COMMUNITY): Payer: Self-pay

## 2022-06-06 ENCOUNTER — Other Ambulatory Visit (HOSPITAL_COMMUNITY): Payer: Self-pay

## 2022-06-06 ENCOUNTER — Other Ambulatory Visit: Payer: Self-pay

## 2022-06-07 ENCOUNTER — Other Ambulatory Visit (HOSPITAL_COMMUNITY): Payer: Self-pay

## 2022-06-07 ENCOUNTER — Other Ambulatory Visit: Payer: Self-pay

## 2022-06-10 ENCOUNTER — Emergency Department (HOSPITAL_COMMUNITY): Payer: Medicaid Other

## 2022-06-10 ENCOUNTER — Encounter (HOSPITAL_COMMUNITY): Payer: Self-pay

## 2022-06-10 ENCOUNTER — Emergency Department (HOSPITAL_COMMUNITY)
Admission: EM | Admit: 2022-06-10 | Discharge: 2022-06-10 | Disposition: A | Discharge status: Home / Self Care | Payer: Medicaid Other | Attending: Emergency Medicine | Admitting: Emergency Medicine

## 2022-06-10 ENCOUNTER — Other Ambulatory Visit: Payer: Self-pay

## 2022-06-10 ENCOUNTER — Emergency Department (HOSPITAL_COMMUNITY)
Admission: EM | Admit: 2022-06-10 | Discharge: 2022-06-10 | Discharge status: Against Medical Advice | Payer: Medicaid Other

## 2022-06-10 DIAGNOSIS — L03116 Cellulitis of left lower limb: Secondary | ICD-10-CM | POA: Diagnosis not present

## 2022-06-10 DIAGNOSIS — L03115 Cellulitis of right lower limb: Secondary | ICD-10-CM | POA: Diagnosis not present

## 2022-06-10 DIAGNOSIS — Z794 Long term (current) use of insulin: Secondary | ICD-10-CM | POA: Insufficient documentation

## 2022-06-10 DIAGNOSIS — L03119 Cellulitis of unspecified part of limb: Secondary | ICD-10-CM

## 2022-06-10 DIAGNOSIS — R2243 Localized swelling, mass and lump, lower limb, bilateral: Secondary | ICD-10-CM | POA: Diagnosis present

## 2022-06-10 DIAGNOSIS — R600 Localized edema: Secondary | ICD-10-CM

## 2022-06-10 LAB — I-STAT BETA HCG BLOOD, ED (MC, WL, AP ONLY): I-stat hCG, quantitative: <5 m[IU]/mL (ref <5)

## 2022-06-10 LAB — URINALYSIS, ROUTINE W REFLEX MICROSCOPIC
Bacteria, UA: NONE SEEN
Bilirubin Urine: NEGATIVE
Glucose, UA: >=500 mg/dL — AB
Hgb urine dipstick: NEGATIVE
Ketones, ur: 5 mg/dL — AB
Leukocytes,Ua: NEGATIVE
Nitrite: NEGATIVE
Protein, ur: NEGATIVE mg/dL
Specific Gravity, Urine: 1.037 — ABNORMAL HIGH (ref 1.005–1.030)
pH: 7 (ref 5.0–8.0)

## 2022-06-10 LAB — CBC
HCT: 38.4 % (ref 36.0–46.0)
Hemoglobin: 12 g/dL (ref 12.0–15.0)
MCH: 29.6 pg (ref 26.0–34.0)
MCHC: 31.3 g/dL (ref 30.0–36.0)
MCV: 94.8 fL (ref 80.0–100.0)
Platelets: 291 10*3/uL (ref 150–400)
RBC: 4.05 MIL/uL (ref 3.87–5.11)
RDW: 13.9 % (ref 11.5–15.5)
WBC: 5.1 10*3/uL (ref 4.0–10.5)
nRBC: 0 % (ref 0.0–0.2)

## 2022-06-10 LAB — BASIC METABOLIC PANEL
Anion gap: 17 — ABNORMAL HIGH (ref 5–15)
BUN: 20 mg/dL (ref 6–20)
CO2: 20 mmol/L — ABNORMAL LOW (ref 22–32)
Calcium: 8.6 mg/dL — ABNORMAL LOW (ref 8.9–10.3)
Chloride: 98 mmol/L (ref 98–111)
Creatinine, Ser: 0.96 mg/dL (ref 0.44–1.00)
GFR, Estimated: >60 mL/min (ref >60)
Glucose, Bld: 243 mg/dL — ABNORMAL HIGH (ref 70–99)
Potassium: 3.6 mmol/L (ref 3.5–5.1)
Sodium: 135 mmol/L (ref 135–145)

## 2022-06-10 LAB — BRAIN NATRIURETIC PEPTIDE: B Natriuretic Peptide: 58.7 pg/mL (ref 0.0–100.0)

## 2022-06-10 MED ORDER — CEFAZOLIN SODIUM-DEXTROSE 1-4 GM/50ML-% IV SOLN
1.0000 g | Freq: Three times a day (TID) | INTRAVENOUS | Status: DC
  Filled 2022-06-10: qty 50

## 2022-06-10 MED ORDER — CEFTRIAXONE SODIUM 500 MG IJ SOLR
500.0000 mg | Freq: Once | INTRAMUSCULAR | Status: AC
  Administered 2022-06-10: 500 mg via INTRAMUSCULAR
  Filled 2022-06-10: qty 500

## 2022-06-10 MED ORDER — LIDOCAINE HCL (PF) 1 % IJ SOLN
INTRAMUSCULAR | Status: AC
  Administered 2022-06-10: 5 mL
  Filled 2022-06-10: qty 5

## 2022-06-10 MED ORDER — IOHEXOL 350 MG/ML SOLN
75.0000 mL | Freq: Once | INTRAVENOUS | Status: AC | PRN
  Administered 2022-06-10: 75 mL via INTRAVENOUS

## 2022-06-10 MED ORDER — CEPHALEXIN 500 MG PO CAPS: 500.0000 mg | ORAL_CAPSULE | Freq: Four times a day (QID) | ORAL | 0 refills | Status: DC

## 2022-06-10 NOTE — Discharge Instructions (Addendum)
You were seen today for lower extremity swelling concerning for infection and possible blood clot. I have prescribed an antibiotic. Please monitor your legs for worsening redness. Please return tomorrow morning for your DVT studies. If you develop signs of worsening infection or other life threatening symptoms please return immediately to the emergency department.

## 2022-06-10 NOTE — ED Triage Notes (Addendum)
Patient presents with increased leg swelling x 3-4days and now swelling is going into abd.  Hx of DM hep C Patient still currently IV drug user.

## 2022-06-10 NOTE — ED Notes (Signed)
Redness and swelling noted to BLE

## 2022-06-10 NOTE — ED Provider Notes (Signed)
[place] [facility] Provider Note   CSN: [id number] Arrival date & time: 06/10/22  1514     History  Chief Complaint  Patient presents with   Leg Swelling    Anna Dennis is a 32 y.o. female. Patient presents to the emergency department complaining of bilateral leg swelling which has been worsening for days. 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 She typically elevates her feet at the end of the day and swelling subsides. For the past 3-4 days the swelling has continued to increase and the patient now feels that the swelling is extending into her lower abdomen. Patient denies shortness of breath, chest pain. Patient endorses IV drug use. Past medical history significant for hepatitis C (reportedly non-detectable viral load), T1DM on insulin, necrotizing fasciitis  HPI     Home Medications Prior to Admission medications   Medication Sig Start Date End Date Taking? Authorizing Provider  cephALEXin (KEFLEX) 500 MG capsule Take 1 capsule (500 mg total) by mouth 4 (four) times daily. 06/10/22  Yes Darrick Grinder, PA-C  albuterol (VENTOLIN HFA) 108 (90 Base) MCG/ACT inhaler Inhale 2 puffs into the lungs every 4 (four) hours as needed for wheezing or shortness of breath. 05/21/22   Rodriguez-Southworth, Nettie Elm, PA-C  clarithromycin (BIAXIN) 500 MG tablet Take 1 tablet (500 mg total) by mouth 2 (two) times daily. 05/21/22   Rodriguez-Southworth, Nettie Elm, PA-C  glucose blood (GLUCOSE METER TEST) test strip Use as instructed 07/14/20   Azucena Fallen, MD  Insulin Glargine [facility]) 100 UNIT/ML Inject 50 Units into the skin at bedtime. 01/11/22 03/21/22  Elayne Snare K, DO  insulin glargine (LANTUS SOLOSTAR) 100 UNIT/ML Solostar Pen Inject 50 Units into the skin at bedtime.    [provider]  insulin lispro (HUMALOG KWIKPEN) 100 UNIT/ML KwikPen  Inject 1-12 units into the skin three times daily before meals per sliding scale 01/11/22   Theresia Lo, [place] K, DO  insulin lispro (HUMALOG) 100 UNIT/ML injection Inject 1-12 Units into the skin 3 (three) times daily before meals. Per sliding scale    [provider]  Insulin Pen Needle (MM PEN NEEDLES) 32G X 4 MM MISC To be used with insulin as instructed 01/11/22   Elayne Snare K, DO  insulin regular (NOVOLIN R) 100 units/mL injection Inject 50 Units into the skin at bedtime.    [provider]  ondansetron (ZOFRAN-ODT) 4 MG disintegrating tablet Take 1 tablet (4 mg total) by mouth every 8 (eight) hours as needed for nausea or vomiting. 04/16/21   Lorelee New, PA-C  insulin aspart (NOVOLOG) 100 UNIT/ML injection Inject 6 Units into the skin 3 (three) times daily with meals. 02/22/12 02/28/12  Richarda Overlie, MD  Omeprazole 20 MG TBEC Take 1 tablet (20 mg total) by mouth daily. 02/28/12 03/04/12  Cleora Fleet, MD      Allergies    Patient has no known allergies.    Review of Systems   Review of Systems  Physical Exam Updated Vital Signs BP (!) 146/93 (BP Location: Right Arm)   Pulse 89   Temp 98.4 F (36.9 C) (Oral)   Resp 17   Ht 5\' 7"  (1.702 m)   Wt 72.6 kg   LMP 06/10/2022   SpO2 100%   BMI 25.06 kg/m  Physical Exam Vitals and nursing note reviewed.  Constitutional:      General:  She is not in acute distress.    Appearance: She is well-developed.  HENT:     Head: Normocephalic and atraumatic.  Eyes:     Conjunctiva/sclera: Conjunctivae normal.  Cardiovascular:     Rate and Rhythm: Normal rate and regular rhythm.     Heart sounds: No murmur heard. Pulmonary:     Effort: Pulmonary effort is normal. No respiratory distress.     Breath sounds: Normal breath sounds.  Abdominal:     General: There is no distension.     Palpations: Abdomen is soft.     Tenderness: There is no abdominal tenderness.  Musculoskeletal:        General: No  swelling.     Cervical back: Neck supple.     Right lower leg: Edema present.     Left lower leg: Edema present.     Comments: Significant bilateral lower extremity swelling  Skin:    General: Skin is warm and dry.     Capillary Refill: Capillary refill takes less than 2 seconds.     Findings: Erythema present.     Comments: Bilateral legs erythematous, warm to touch. See photo below  Neurological:     Mental Status: She is alert.  Psychiatric:        Mood and Affect: Mood normal.     ED Results / Procedures / Treatments   Labs (all labs ordered are listed, but only abnormal results are displayed) Labs Reviewed  BASIC METABOLIC PANEL - Abnormal; Notable for the following components:      Result Value   CO2 20 (*)    Glucose, Bld 243 (*)    Calcium 8.6 (*)    Anion gap 17 (*)    All other components within normal limits  URINALYSIS, ROUTINE W REFLEX MICROSCOPIC - Abnormal; Notable for the following components:   Specific Gravity, Urine 1.037 (*)    Glucose, UA >=500 (*)    Ketones, ur 5 (*)    All other components within normal limits  CBC  BRAIN NATRIURETIC PEPTIDE  I-STAT BETA HCG BLOOD, ED ([facility], [facility], [facility] ONLY)    EKG EKG Interpretation  Date/Time:  Monday Jun 10 2022 15:46:18 EDT Ventricular Rate:  99 PR Interval:  122 QRS Duration: 86 QT Interval:  394 QTC Calculation: 505 R Axis:   90 Text Interpretation: Sinus rhythm with Premature supraventricular complexes Rightward axis Nonspecific ST abnormality Prolonged QT Abnormal ECG Confirmed by Gloris Manchester ([id number]) on 06/10/2022 9:27:22 PM  Radiology CT CHEST ABDOMEN PELVIS W CONTRAST  Result Date: 06/10/2022 CLINICAL DATA:  Bilateral lower extremity pain and swelling EXAM: CT CHEST, ABDOMEN, AND PELVIS WITH CONTRAST TECHNIQUE: Multidetector CT imaging of the chest, abdomen and pelvis was performed following the standard protocol during bolus administration of intravenous contrast. RADIATION DOSE REDUCTION: This exam was  performed according to the departmental dose-optimization program which includes automated exposure control, adjustment of the mA and/or kV according to patient size and/or use of iterative reconstruction technique. CONTRAST:  75mL OMNIPAQUE IOHEXOL 350 MG/ML SOLN COMPARISON:  None Available. FINDINGS: CT CHEST FINDINGS Cardiovascular: Thoracic aorta and its branches are within normal limits. No cardiac enlargement is seen. Pulmonary artery as visualized shows no large central pulmonary embolus although not timed for embolus evaluation. Mediastinum/Nodes: Thoracic inlet is within normal limits. No hilar or mediastinal adenopathy is noted. The esophagus as visualized is within normal limits. Lungs/Pleura: Lungs are well aerated bilaterally. No focal infiltrate or sizable effusion is seen. No parenchymal nodule is noted.  Musculoskeletal: No acute bony abnormality is noted. CT ABDOMEN PELVIS FINDINGS Hepatobiliary: No focal liver abnormality is seen. No gallstones, gallbladder wall thickening, or biliary dilatation. Pancreas: Unremarkable. No pancreatic ductal dilatation or surrounding inflammatory changes. Spleen: Normal in size without focal abnormality. Adrenals/Urinary Tract: Adrenal glands are within normal limits. Kidneys demonstrate a normal enhancement pattern bilaterally. No renal calculi or obstructive changes are seen. The bladder is well distended. Stomach/Bowel: Fecal material is noted throughout the colon without obstructive change. The appendix is within normal limits. Fluid-filled loops of small bowel are noted although no obstructive changes are seen. The stomach is within normal limits. Vascular/Lymphatic: Abdominal aorta is within normal limits. No sizable lymphadenopathy is noted. No evidence of venous thrombus or compression is noted. Reactive lymph nodes are noted in the inguinal region bilaterally. Reproductive: Uterus is within normal limits. Tampon is noted in the vaginal vault. Other: No  abdominal wall hernia or abnormality. No abdominopelvic ascites. Musculoskeletal: No acute bony abnormality is noted. Mild subcutaneous edema is noted in the upper thighs and pelvic region consistent with the given clinical history. Some areas of subcutaneous density are noted in the anterior abdomen on which may be related to prior subcutaneous injections. IMPRESSION: CT of the chest: No acute abnormality noted. CT of the abdomen and pelvis: Mild soft tissue swelling is noted in the visualized lower extremities although no venous abnormality is noted. Reactive lymph nodes are noted in the inguinal region bilaterally. No other focal abnormality is seen. Electronically Signed   By: Alcide Clever M.D.   On: 06/10/2022 19:40   DG Chest 2 View  Result Date: 06/10/2022 CLINICAL DATA:  Leg swelling.  Patient denies shortness of breath. EXAM: CHEST - 2 VIEW COMPARISON:  Chest radiograph dated July 11, 2020 FINDINGS: The heart size and mediastinal contours are within normal limits. Both lungs are clear. The visualized skeletal structures are unremarkable. IMPRESSION: No active cardiopulmonary disease. Electronically Signed   By: Larose Hires D.O.   On: 06/10/2022 16:24    Procedures Procedures    Medications Ordered in ED Medications  ceFAZolin (ANCEF) IVPB 1 g/50 mL premix (1 g Intravenous Not Given 06/10/22 2204)  iohexol (OMNIPAQUE) 350 MG/ML injection 75 mL (75 mLs Intravenous Contrast Given 06/10/22 1921)  cefTRIAXone (ROCEPHIN) injection 500 mg (500 mg Intramuscular Given 06/10/22 2209)  lidocaine (PF) (XYLOCAINE) 1 % injection (5 mLs  Given 06/10/22 2212)    ED Course/ Medical Decision Making/ A&P                             Medical Decision Making Amount and/or Complexity of Data Reviewed Labs: ordered. Radiology: ordered.  Risk Prescription drug management.   This patient presents to the ED for concern of bilateral lower extremity swelling, this involves an extensive number of treatment  options, and is a complaint that carries with it a high risk of complications and morbidity.  The differential diagnosis includes heart failure, cirrhosis, cellulitis, lymphedema, others   Co morbidities that complicate the patient evaluation  History of hepatitis C, current IV drug use   Lab Tests:  I Ordered, and personally interpreted labs.  The pertinent results include: Grossly unremarkable BMP, normal BNP, normal CBC, UA with glucose but otherwise grossly unremarkable   Imaging Studies ordered:  I ordered imaging studies including CT chest abdomen pelvis with contrast, chest x-ray, DVT studies of bilateral lower extremities I independently visualized and interpreted imaging which showed no acute disease  on chest x-ray. CT of the chest: No acute abnormality noted.    CT of the abdomen and pelvis: Mild soft tissue swelling is noted in  the visualized lower extremities although no venous abnormality is  noted.    Reactive lymph nodes are noted in the inguinal region bilaterally.    No other focal abnormality is seen.   I agree with the radiologist interpretation   Cardiac Monitoring: / EKG:  The patient was maintained on a cardiac monitor.  I personally viewed and interpreted the cardiac monitored which showed an underlying rhythm of: Sinus rhythm with PVCs   Problem List / ED Course / Critical interventions / Medication management   I ordered medication including Rocephin for antibiotic coverage Reevaluation of the patient after these medicines showed that the patient stayed the same I have reviewed the patients home medicines and have made adjustments as needed   Social Determinants of Health:  Patient has Medicaid for primary insurance.  Patient is an active IV drug user   Test / Admission - Considered:  Patient with erythematous, warm, swollen bilateral lower extremities.  DVT study pending at this time.  Discussed admission with patient with plans for IV  antibiotics with DVT study in the morning. Concern for cellulitis. Patient high risk with IVDU.  Patient declined admission at this time.  Patient understands risks of declining admission.  Will treat with 1 course of intramuscular antibiotics here and prescribed Keflex for home.  Patient given order to return tomorrow morning for DVT study once available.  Patient states she will return at that time.  Patient states he was unable to stay tonight due to issues with care at home.  Return precautions provided.  Discharged home.        Final Clinical Impression(s) / ED Diagnoses Final diagnoses:  Cellulitis of lower extremity, unspecified laterality  Peripheral edema    Rx / DC Orders ED Discharge Orders          Ordered    cephALEXin (KEFLEX) 500 MG capsule  4 times daily        06/10/22 2158    LE VENOUS        06/10/22 2220              Pamala Duffel 06/10/22 2229    Gloris Manchester, MD 06/12/22 [phone]

## 2022-06-10 NOTE — ED Notes (Signed)
Pt called for triage x2 without answer, pt not in lobby or lobby restrooms

## 2022-06-11 ENCOUNTER — Ambulatory Visit (HOSPITAL_COMMUNITY): Admission: RE | Admit: 2022-06-11 | Payer: Medicaid Other | Source: Ambulatory Visit

## 2022-06-11 ENCOUNTER — Ambulatory Visit (HOSPITAL_COMMUNITY)
Admission: RE | Admit: 2022-06-11 | Discharge: 2022-06-11 | Disposition: A | Discharge status: Home / Self Care | Payer: Medicaid Other | Source: Ambulatory Visit | Attending: Vascular Surgery | Admitting: Vascular Surgery

## 2022-06-11 DIAGNOSIS — M7989 Other specified soft tissue disorders: Secondary | ICD-10-CM | POA: Insufficient documentation

## 2022-06-11 DIAGNOSIS — R609 Edema, unspecified: Secondary | ICD-10-CM | POA: Insufficient documentation

## 2022-07-12 ENCOUNTER — Other Ambulatory Visit (HOSPITAL_COMMUNITY): Payer: Self-pay

## 2022-07-17 ENCOUNTER — Other Ambulatory Visit (HOSPITAL_COMMUNITY): Payer: Self-pay

## 2022-08-30 ENCOUNTER — Other Ambulatory Visit: Payer: Self-pay

## 2022-09-02 ENCOUNTER — Other Ambulatory Visit (HOSPITAL_COMMUNITY): Payer: Self-pay

## 2022-09-26 ENCOUNTER — Other Ambulatory Visit (HOSPITAL_COMMUNITY): Payer: Self-pay

## 2022-09-27 ENCOUNTER — Other Ambulatory Visit: Payer: Self-pay

## 2022-10-09 ENCOUNTER — Other Ambulatory Visit (HOSPITAL_COMMUNITY): Payer: Self-pay

## 2022-10-17 ENCOUNTER — Other Ambulatory Visit (HOSPITAL_COMMUNITY): Payer: Self-pay

## 2022-11-08 ENCOUNTER — Other Ambulatory Visit (HOSPITAL_COMMUNITY): Payer: Self-pay

## 2022-11-13 ENCOUNTER — Other Ambulatory Visit (HOSPITAL_COMMUNITY): Payer: Self-pay

## 2022-11-28 ENCOUNTER — Other Ambulatory Visit (HOSPITAL_COMMUNITY): Payer: Self-pay

## 2022-12-17 ENCOUNTER — Other Ambulatory Visit (HOSPITAL_COMMUNITY): Payer: Self-pay

## 2023-01-02 ENCOUNTER — Other Ambulatory Visit (HOSPITAL_COMMUNITY): Payer: Self-pay

## 2023-01-07 ENCOUNTER — Other Ambulatory Visit (HOSPITAL_COMMUNITY): Payer: Self-pay

## 2023-02-11 ENCOUNTER — Other Ambulatory Visit (HOSPITAL_COMMUNITY): Payer: Self-pay

## 2023-02-21 ENCOUNTER — Other Ambulatory Visit (HOSPITAL_COMMUNITY): Payer: Self-pay

## 2023-03-16 ENCOUNTER — Ambulatory Visit (HOSPITAL_COMMUNITY)
Admission: EM | Admit: 2023-03-16 | Discharge: 2023-03-16 | Disposition: A | Discharge status: Home / Self Care | Payer: MEDICAID | Attending: Emergency Medicine | Admitting: Emergency Medicine

## 2023-03-16 ENCOUNTER — Encounter (HOSPITAL_COMMUNITY): Payer: Self-pay

## 2023-03-16 DIAGNOSIS — J0100 Acute maxillary sinusitis, unspecified: Secondary | ICD-10-CM

## 2023-03-16 MED ORDER — AMOXICILLIN-POT CLAVULANATE 875-125 MG PO TABS: 1.0000 | ORAL_TABLET | Freq: Two times a day (BID) | ORAL | 0 refills | Status: DC

## 2023-03-16 NOTE — Discharge Instructions (Addendum)
Start taking Augmentin twice daily for 7 days for sinus infection.  Otherwise alternate between Tylenol and ibuprofen as needed for pain and fever.  You can take Mucinex as needed for cough and congestion.  Stay hydrated and get plenty of rest.  Return here as needed.

## 2023-03-16 NOTE — ED Provider Notes (Signed)
[facility]    CSN: [id number] Arrival date & time: 03/16/23  1644      History   Chief Complaint Chief Complaint  Patient presents with   Cough    HPI Anna Dennis is a 33 y.o. female.   Patient presents with cough, nasal congestion, runny nose, sore throat, headache, sinus pain and pressure x 3 days.  Patient reports noticing thick green mucus.  Denies chest pain, shortness of breath, and fever.   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   Hyperkalemia 09/05/2015   Syncope 09/05/2015   Acute kidney injury (nontraumatic) (HCC)    Heroin abuse (HCC)    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) 08/10/2014   Dehydration 02/20/2012   Sinus tachycardia 02/20/2012   Leukocytosis 02/19/2012   GERD (gastroesophageal reflux disease) 02/19/2012   DKA (diabetic ketoacidosis) (HCC) 01/19/2012   Type 1 diabetes mellitus with hyperosmolarity without nonketotic hyperglycemic hyperosmolar coma (HCC) 03/14/2009   Depression 03/14/2009    Past Surgical History:  Procedure Laterality Date   I & D EXTREMITY Left 07/11/2020   Procedure: IRRIGATION AND DEBRIDEMENT EXTREMITY;  Surgeon: Terance Hart, MD;  Location: [facility] OR;  Service: Orthopedics;  Laterality: Left;   I & D EXTREMITY Left 08/27/2020   Procedure: IRRIGATION AND DEBRIDEMENT HAND;  Surgeon: Allena Napoleon, MD;  Location: [facility] OR;  Service: Plastics;  Laterality: Left;   SKIN SPLIT  GRAFT Left 04/16/2021   Procedure: SKIN GRAFT SPLIT THICKNESS FROM LEFT THIGH TO LEFT UPPER ARM;  Surgeon: Allena Napoleon, MD;  Location: [facility] OR;  Service: Plastics;  Laterality: Left;    OB History   No obstetric history on file.      Home Medications    Prior to Admission medications   Medication Sig Start Date End Date Taking? Authorizing Provider  amoxicillin-clavulanate (AUGMENTIN) 875-125 MG tablet Take 1 tablet by mouth every 12 (twelve) hours. 03/16/23  Yes Wynonia Lawman A, NP  Insulin Pen Needle (MM PEN NEEDLES) 32G X 4 MM MISC To be used with insulin as instructed 01/11/22  Yes Theresia Lo, [place] K, DO  insulin regular (NOVOLIN R) 100 units/mL injection Inject 50 Units into the skin at bedtime.   Yes [provider]  glucose blood (GLUCOSE METER TEST) test strip Use as instructed 07/14/20   Azucena Fallen, MD  Insulin Glargine [facility]) 100 UNIT/ML Inject 50 Units into the skin at bedtime. 01/11/22 03/21/22  Rexford Maus, DO  insulin lispro (HUMALOG KWIKPEN) 100 UNIT/ML KwikPen Inject 1-12 units into the skin three times daily before meals per sliding scale 01/11/22   Theresia Lo, [place] K, DO  insulin aspart (NOVOLOG) 100 UNIT/ML injection Inject 6 Units into the skin 3 (three) times daily with meals. 02/22/12 02/28/12  Richarda Overlie, MD  Omeprazole 20 MG TBEC Take 1 tablet (20 mg total) by mouth daily. 02/28/12  03/04/12  Cleora Fleet, MD    Family History Family History  Problem Relation Age of Onset   Bipolar disorder Mother    Schizophrenia Sister     Social History Social History   Tobacco Use   Smoking status: Every Day    Types: Cigarettes   Smokeless tobacco: Never  Vaping Use   Vaping status: Never Used  Substance Use Topics   Alcohol use: Yes    Alcohol/week: 0.0 standard drinks of alcohol    Comment: occasional   Drug use: Not Currently    Comment: patient states clean since 03/29/14     Allergies   Patient has no  known allergies.   Review of Systems Review of Systems  Respiratory:  Positive for cough.    Per HPI  Physical Exam Triage Vital Signs ED Triage Vitals  Encounter Vitals Group     BP 03/16/23 1733 (!) 167/95     Systolic BP Percentile --      Diastolic BP Percentile --      Pulse Rate 03/16/23 1733 97     Resp 03/16/23 1733 18     Temp 03/16/23 1733 98.3 F (36.8 C)     Temp Source 03/16/23 1733 Oral     SpO2 03/16/23 1733 96 %     Weight --      Height --      Head Circumference --      Peak Flow --      Pain Score 03/16/23 1730 8     Pain Loc --      Pain Education --      Exclude from Growth Chart --    No data found.  Updated Vital Signs BP (!) 167/95 (BP Location: Left Arm)   Pulse 97   Temp 98.3 F (36.8 C) (Oral)   Resp 18   LMP 02/24/2023 (Approximate)   SpO2 96%   Visual Acuity Right Eye Distance:   Left Eye Distance:   Bilateral Distance:    Right Eye Near:   Left Eye Near:    Bilateral Near:     Physical Exam Vitals and nursing note reviewed.  Constitutional:      General: She is awake. She is not in acute distress.    Appearance: Normal appearance. She is well-developed and well-groomed. She is not ill-appearing.  HENT:     Right Ear: Tympanic membrane, ear canal and external ear normal.     Left Ear: Tympanic membrane, ear canal and external ear normal.     Nose: Congestion and rhinorrhea present.     Right Sinus: Maxillary sinus tenderness present.     Left Sinus: Maxillary sinus tenderness present.     Mouth/Throat:     Mouth: Mucous membranes are moist.     Pharynx: Posterior oropharyngeal erythema present. No oropharyngeal exudate.  Cardiovascular:     Rate and Rhythm: Normal rate and regular rhythm.  Pulmonary:     Effort: Pulmonary effort is normal.     Breath sounds: Normal breath sounds.  Neurological:     Mental Status: She is alert.  Psychiatric:        Behavior: Behavior is cooperative.      UC Treatments / Results   Labs (all labs ordered are listed, but only abnormal results are displayed) Labs Reviewed - No data to display  EKG   Radiology No results found.  Procedures Procedures (including critical care time)  Medications Ordered in UC Medications - No data  to display  Initial Impression / Assessment and Plan / UC Course  I have reviewed the triage vital signs and the nursing notes.  Pertinent labs & imaging results that were available during my care of the patient were reviewed by me and considered in my medical decision making (see chart for details).     Patient presented with 3-day history of cough, nasal congestion, runny nose, sore throat, headache, sinus pain and pressure.  Patient reports noticing thick green mucus.  Upon assessment congestion and rhinorrhea are present, mild erythema noted to pharynx.  Bilateral maxillary sinus tenderness noted.  Lungs clear bilaterally to auscultation.  Prescribed Augmentin for maxillary sinusitis.  Discussed over-the-counter medications for symptoms.  Discussed return precautions. Final Clinical Impressions(s) / UC Diagnoses   Final diagnoses:  Acute non-recurrent maxillary sinusitis     Discharge Instructions      Start taking Augmentin twice daily for 7 days for sinus infection.  Otherwise alternate between Tylenol and ibuprofen as needed for pain and fever.  You can take Mucinex as needed for cough and congestion.  Stay hydrated and get plenty of rest.  Return here as needed.   ED Prescriptions     Medication Sig Dispense Auth. Provider   amoxicillin-clavulanate (AUGMENTIN) 875-125 MG tablet Take 1 tablet by mouth every 12 (twelve) hours. 14 tablet Wynonia Lawman A, NP      PDMP not reviewed this encounter.   Wynonia Lawman A, NP 03/16/23 1816

## 2023-03-16 NOTE — ED Triage Notes (Signed)
Chief Complaint: Nasal congestion, left ear pain with muffled hearing, runny nose, sore throat, and cough.   Sick exposure: No  Onset: 2 days   Prescriptions or OTC medications tried: Yes- Advil, cough drops     with mild relief  New foods, medications, or products: No  Recent Travel: No

## 2023-03-18 IMAGING — DX DG HUMERUS 2V *L*
1 series · 2 of 2 positions shown · non-contrast
Comparison: None.

CLINICAL DATA: Hyperglycemic, has not recently taken diabetes
medications. Reports broken IV needle in the upper arm 1 week prior.
Reported IVDU, heroin use.

EXAM:
LEFT HUMERUS - 2+ VIEW

[Series 1: humerus · 0.14mm/px · 2 of 2 slices shown]
[im 1/2]
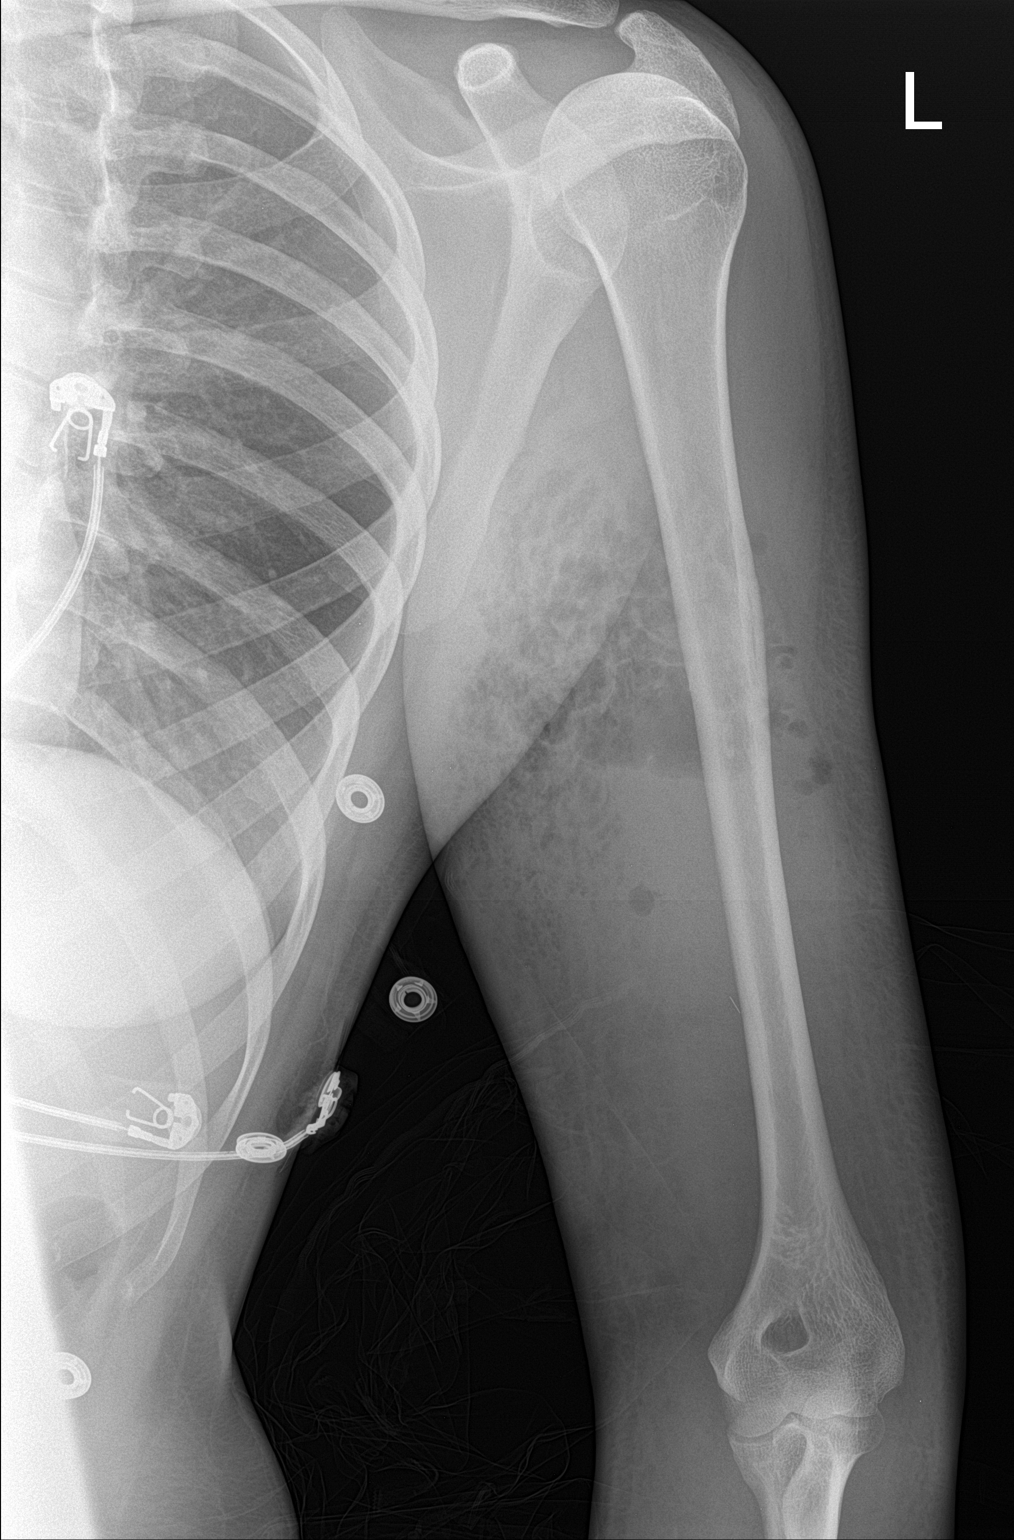
[im 2/2]
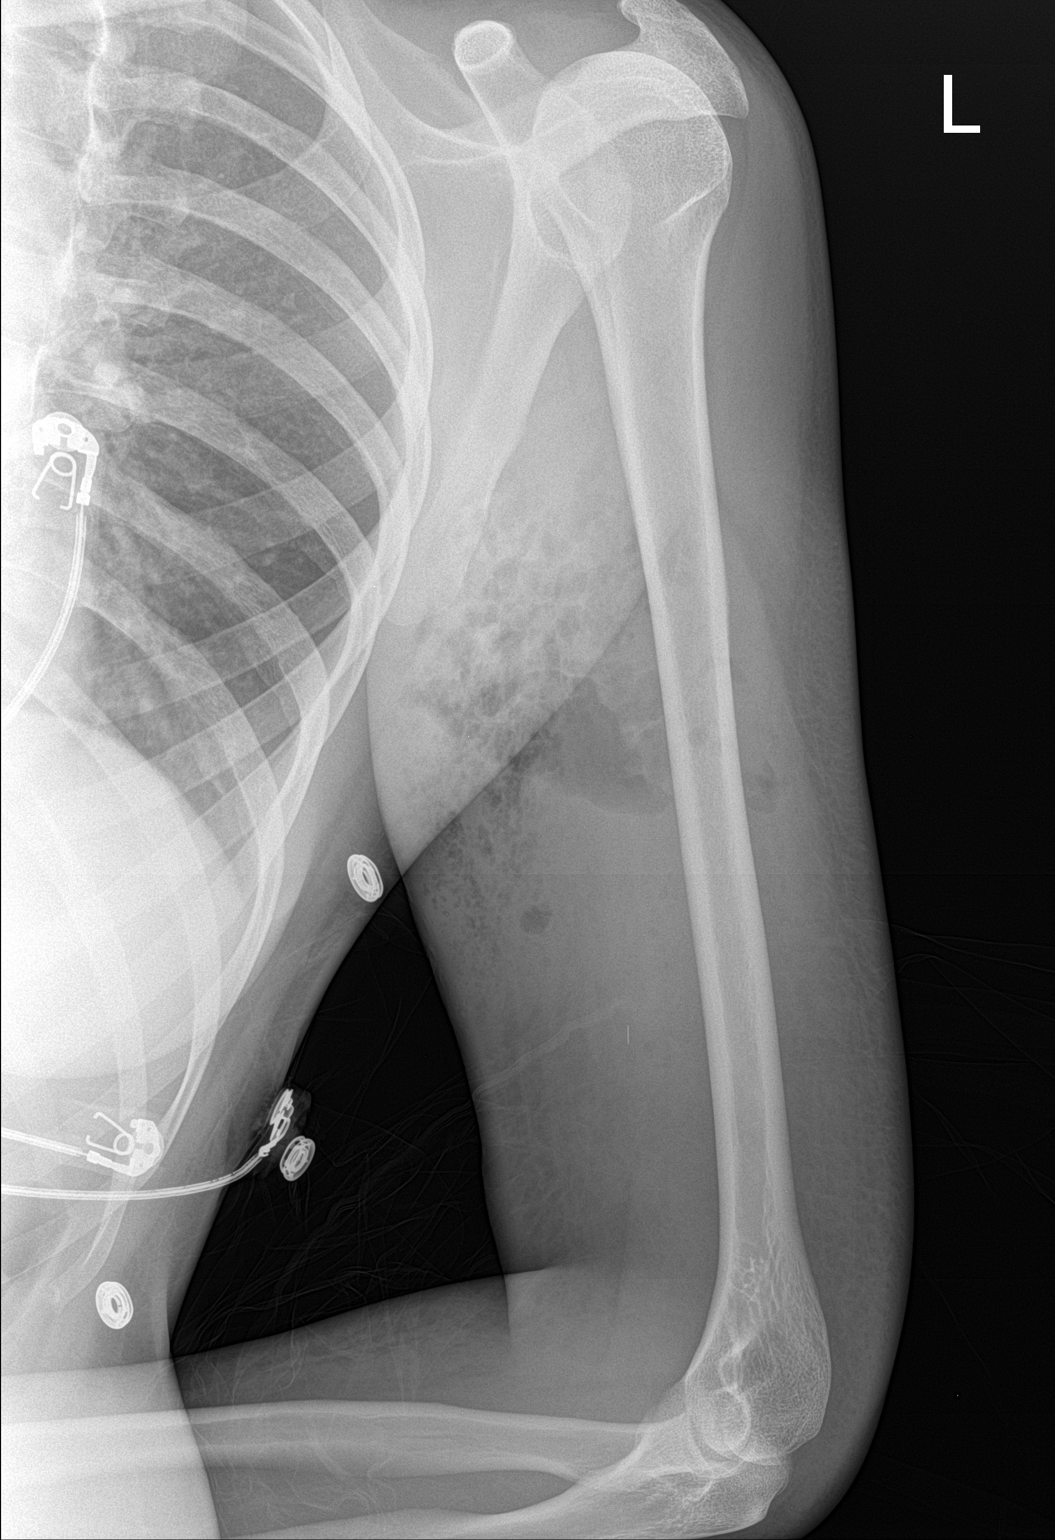

[2 of 2 positions shown; findings below may reference images not displayed]

FINDINGS: There is extensive circumferential soft tissue swelling of the upper
arm with the large volume of soft tissue gas predominantly within
the anteromedial soft tissues. A tiny linear 6 mm foreign body is
seen in anterior soft tissues of the distal upper arm compatible
with reported broken needle. No acute fracture, traumatic
malalignment, suspicious osseous lesions or radiographic features of
osteomyelitis seen at this time.
IMPRESSION: Extensive soft tissue swelling and edematous changes with widespread
soft tissue gas in the anteromedial upper arm concerning for an
aggressive soft tissue infection with gas-forming organism. Emergent
surgical assessment is warranted.

Tiny 6 mm linear radiodense foreign body in the distal anterior
aspect of the upper arm compatible with reported foreign body
(broken intravenous needle tip)

Critical Value/emergent results were called by telephone at the time
of interpretation on 07/11/2020 at [DATE] to provider ERXLEBEN
HG , who verbally acknowledged these results.

## 2023-03-18 IMAGING — CT CT EXTREM UP ENTIRE ARM*L* WO/W CM
3 series · 10 of 34 positions shown, 11 images · IV contrast (agent unspecified)
Comparison: Left humerus radiographs 1451 hours.

CONTRAST:  100mL OMNIPAQUE IOHEXOL 300 MG/ML  SOLN

CLINICAL DATA: 29-year-old female with left upper extremity pain
swelling and redness. Extensive soft tissue gas about the humerus on
x-rays, with possible tiny metallic needle retained foreign body.

EXAM:
CT OF THE UPPER LEFT EXTREMITY WITHOUT AND WITH CONTRAST
TECHNIQUE: Multidetector CT imaging of the upper left extremity was performed
following the standard protocol before and during bolus
administration of intravenous contrast.

[Series 4: upper ext 1.5 st · axial · 0.44mm/px · z∈[+1348,+1348]mm · 1 of 316 slices shown, 2 images]
[im 170/316  soft-tissue]
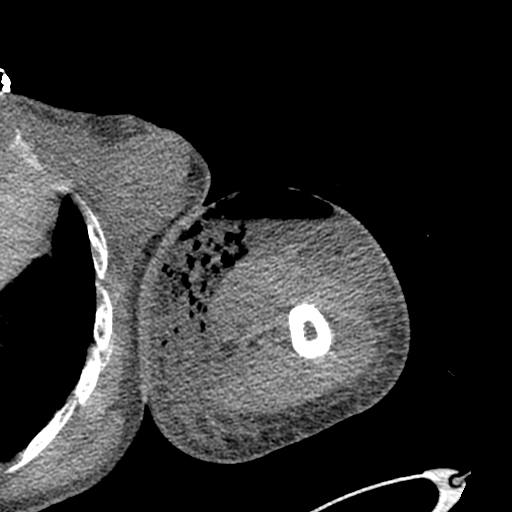
[im 170/316  bone]
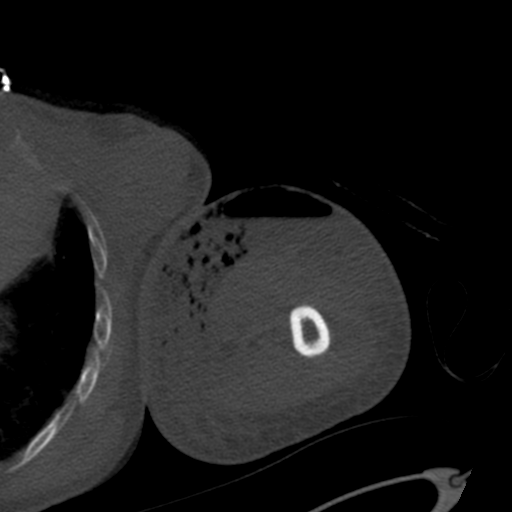

[Series 9: upper ext cor st · coronal · 0.57mm/px · 3 of 167 slices shown]
[im 34/167  bone]
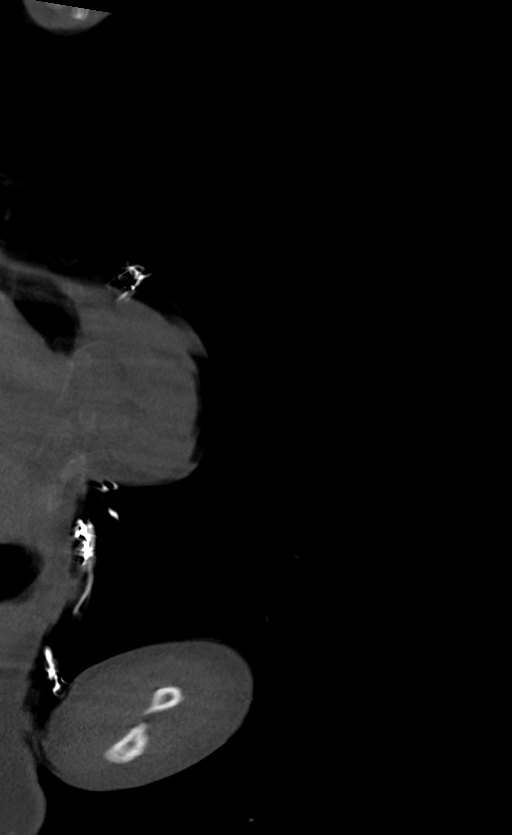
[im 67/167  bone]
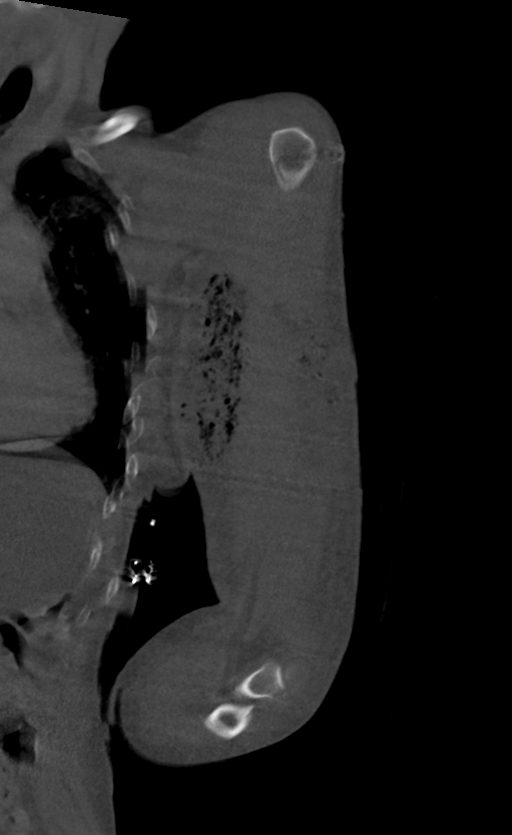
[im 100/167  bone]
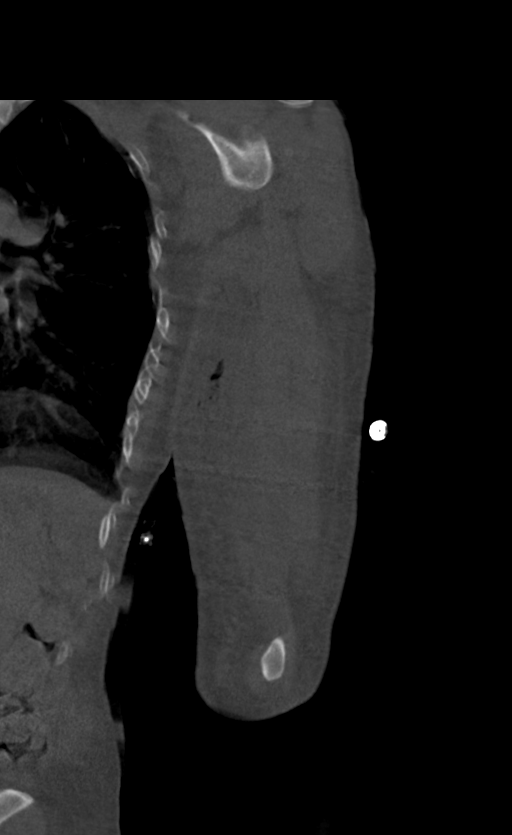

[Series 10: upper ext sag st · sagittal · 0.45mm/px · 6 of 134 slices shown]
[im 45/134  bone]
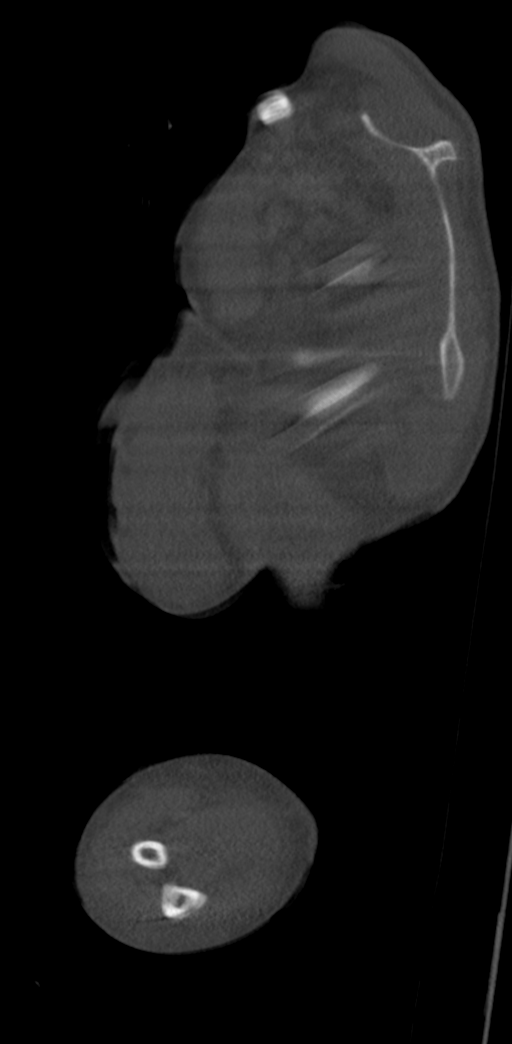
[im 56/134  bone]
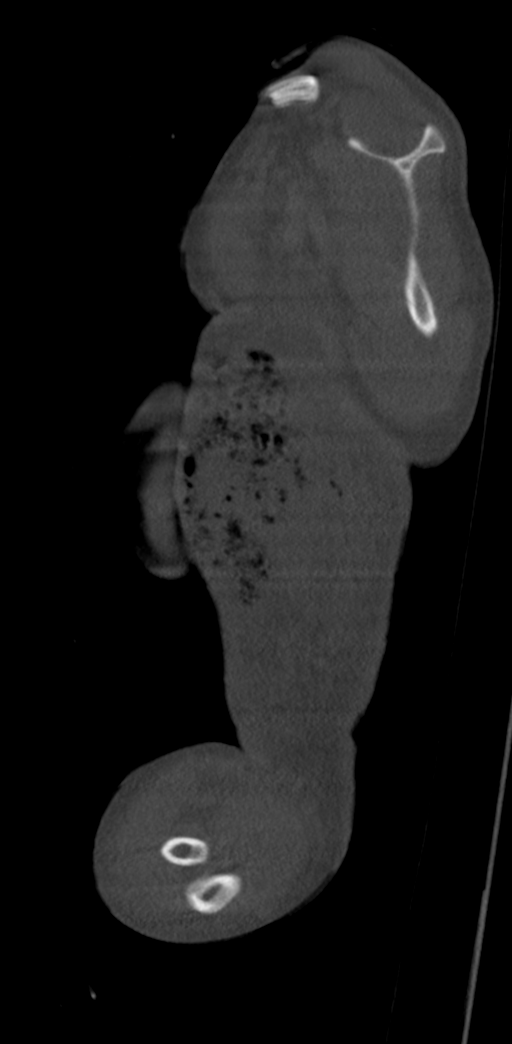
[im 67/134  bone]
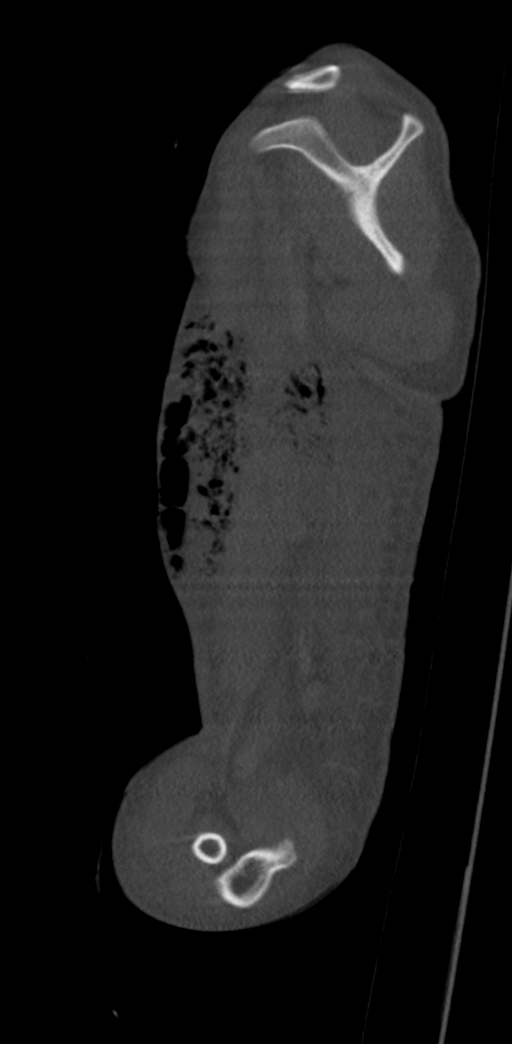
[im 72/134  soft-tissue]
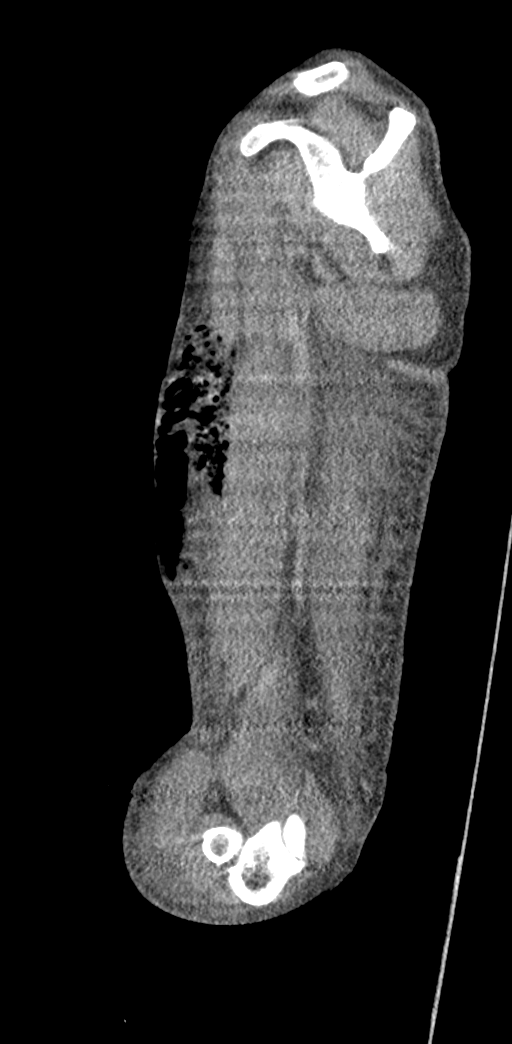
[im 78/134  bone]
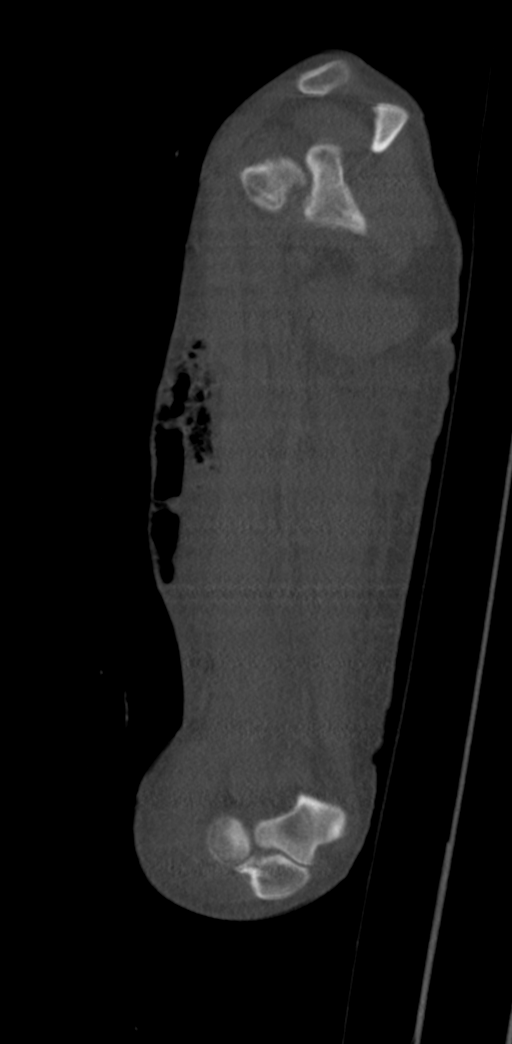
[im 89/134  bone]
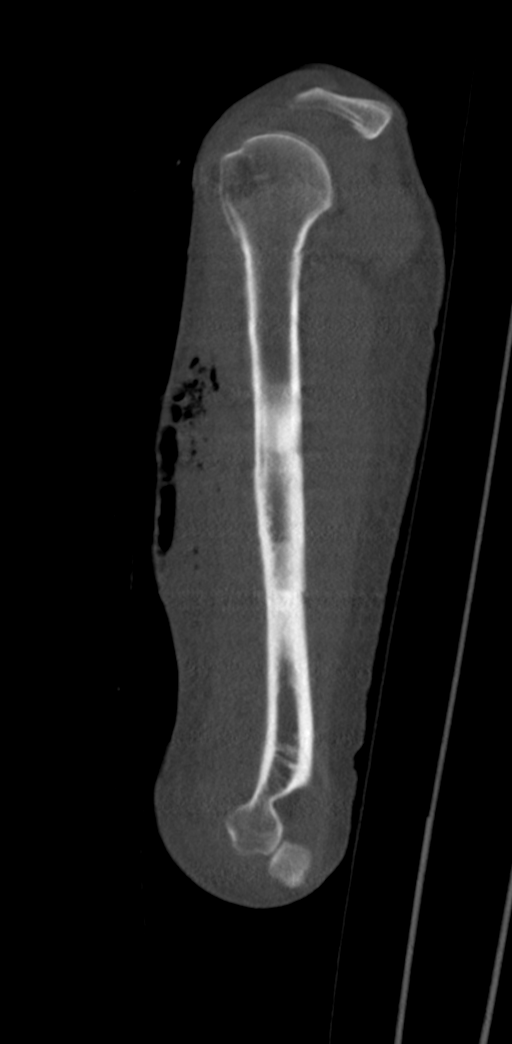

[10 of 34 positions shown; findings below may reference images not displayed]

FINDINGS: Study is moderately degraded by motion artifact.

Subtle, linear roughly 8-9 mm hyperdense foreign body suspected in
the ventral subcutaneous soft tissues on series 4, image 172
corresponding to the radiographic finding. This is 4 mm deep to the
skin surface.

Large volume of subcutaneous gas throughout the ventral upper arm,
tracking medially more so than laterally. Superimposed diffuse
subcutaneous edema. No organized fluid collection identified.

The soft tissue abnormality extends to the ventral muscle layer, and
some ventral myositis is suspected (for example on series 4, image
103 at the mid humerus shaft level).

IV contrast bolus is insufficient for detailed vascular evaluation.
There are small but reactive appearing left axillary lymph nodes
individually up to 14 mm short axis.

Normal alignment of osseous structures at the visible left shoulder.
Motion artifact degrades bone detail. And there is suspicious
osteopenia of the ventral humerus cortex, for example on series 3,
image 120. But no discrete cortical osteolysis.

Alignment appears maintained at the left elbow, and no elbow joint
effusion is identified.

Grossly negative visible left chest and upper abdomen.
IMPRESSION: 1. Moderately degraded by motion.
2. Gas-forming infection, consider necrotizing Fasciitis.
3. Subtle retained metallic foreign body, possible needle fragment,
located 4 mm deep to the skin surface on series 4, image 172.
4. No organized fluid collection or abscess identified.
5. Limited evaluation for humerus osteomyelitis due to motion, but
there is suspicious osteopenia of the ventral humeral cortex.
6. Reactive right axillary lymph nodes.

## 2023-05-02 ENCOUNTER — Other Ambulatory Visit: Payer: Self-pay

## 2023-05-02 ENCOUNTER — Ambulatory Visit (INDEPENDENT_AMBULATORY_CARE_PROVIDER_SITE_OTHER): Payer: MEDICAID | Admitting: Nurse Practitioner

## 2023-05-02 ENCOUNTER — Encounter: Payer: Self-pay | Admitting: Nurse Practitioner

## 2023-05-02 ENCOUNTER — Other Ambulatory Visit (HOSPITAL_COMMUNITY): Payer: Self-pay

## 2023-05-02 VITALS — BP 126/94 | HR 101 | Temp 98.2°F | Wt 158.2 lb

## 2023-05-02 DIAGNOSIS — H547 Unspecified visual loss: Secondary | ICD-10-CM

## 2023-05-02 DIAGNOSIS — G4710 Hypersomnia, unspecified: Secondary | ICD-10-CM | POA: Diagnosis not present

## 2023-05-02 DIAGNOSIS — E1065 Type 1 diabetes mellitus with hyperglycemia: Secondary | ICD-10-CM | POA: Diagnosis not present

## 2023-05-02 DIAGNOSIS — L02512 Cutaneous abscess of left hand: Secondary | ICD-10-CM

## 2023-05-02 DIAGNOSIS — R600 Localized edema: Secondary | ICD-10-CM

## 2023-05-02 DIAGNOSIS — Z13220 Encounter for screening for lipoid disorders: Secondary | ICD-10-CM

## 2023-05-02 LAB — POCT GLYCOSYLATED HEMOGLOBIN (HGB A1C): Hemoglobin A1C: 10.8 % — AB (ref 4.0–5.6)

## 2023-05-02 MED ORDER — DOXYCYCLINE HYCLATE 100 MG PO TABS: 100.0000 mg | ORAL_TABLET | Freq: Two times a day (BID) | ORAL | 0 refills | Status: AC

## 2023-05-02 MED ORDER — INSULIN LISPRO (1 UNIT DIAL) 100 UNIT/ML (KWIKPEN): PEN_INJECTOR | SUBCUTANEOUS | 1 refills | Status: DC

## 2023-05-02 MED ORDER — INSULIN LISPRO (1 UNIT DIAL) 100 UNIT/ML (KWIKPEN)
PEN_INJECTOR | SUBCUTANEOUS | 11 refills | Status: DC
  Filled 2023-05-02: qty 15, fill #0

## 2023-05-02 MED ORDER — BASAGLAR KWIKPEN 100 UNIT/ML ~~LOC~~ SOPN
50.0000 [IU] | PEN_INJECTOR | Freq: Every day | SUBCUTANEOUS | 0 refills | Status: DC
  Filled 2023-05-02 – 2023-05-16 (×2): qty 15, 30d supply, fill #0

## 2023-05-02 NOTE — Progress Notes (Signed)
 Subjective   Patient ID: Anna Dennis, female    DOB: [birth year], 33 y.o.   MRN: [id number]  Chief Complaint  Patient presents with   Establish Care    Referring provider: No ref. provider found  Anna Dennis is a 33 y.o. female with Past Medical History: No date: Depression No date: Diabetes mellitus     Comment:  Insulin pump No date: GERD (gastroesophageal reflux disease) No date: Hepatitis C No date: Heroin abuse (HCC) No date: Iron deficiency anemia No date: IVDU (intravenous drug user) No date: OCD (obsessive compulsive disorder)   HPI  Patient presents today to establish care.  She does have a history of type 1 diabetes and A1c in office today of 10.8.  We will place a referral to endocrinology for further evaluation and treatment.  Patient has not seen a provider in the past year.  She has been on over-the-counter insulin from [employer].  She does need a refill on her Lantus and short acting insulin.  Will place refills today.  Patient does need labs.  She also has abscess to her left hand.  She states that this is from IV drug use.  We will order doxycycline.  Patient states that she did have her initial appointment with [facility] today and is getting into a drug rehab center.  She is very sleepy and slurring her speech in the office today.  Requesting for referral to eye doctor.  Patient is also requesting referral to vascular for evaluation of peripheral edema.  She states that this happens intermittently and does subside with elevating her legs.  But it is pretty significant when it does happen.  Denies f/c/s, n/v/d, hemoptysis, PND, leg swelling Denies chest pain or edema            No Known Allergies  Immunization History  Administered Date(s) Administered   Hepatitis B, ADULT 10/25/2014, 12/29/2014, 05/01/2015   Influenza Split 02/22/2012   Influenza,inj,Quad PF,6+ Mos 10/25/2014   Influenza,inj,Quad PF,6-35 Mos 12/02/2017   PFIZER(Purple  Top)SARS-COV-2 Vaccination 11/23/2019, 12/14/2019   Pneumococcal Polysaccharide-23 12/02/2017    Tobacco History: Social History   Tobacco Use  Smoking Status Every Day   Types: Cigarettes  Smokeless Tobacco Never   Ready to quit: Yes Counseling given: Yes   Outpatient Encounter Medications as of 05/02/2023  Medication Sig   doxycycline (VIBRA-TABS) 100 MG tablet Take 1 tablet (100 mg total) by mouth 2 (two) times daily for 10 days.   insulin regular (NOVOLIN R) 100 units/mL injection Inject 50 Units into the skin at bedtime.   amoxicillin-clavulanate (AUGMENTIN) 875-125 MG tablet Take 1 tablet by mouth every 12 (twelve) hours. (Patient not taking: Reported on 05/02/2023)   glucose blood (GLUCOSE METER TEST) test strip Use as instructed (Patient not taking: Reported on 05/02/2023)   Insulin Glargine (BASAGLAR KWIKPEN) 100 UNIT/ML Inject 50 Units into the skin at bedtime.   insulin lispro (HUMALOG KWIKPEN) 100 UNIT/ML KwikPen Inject 1-12 units into the skin three times daily before meals per sliding scale   Insulin Pen Needle (MM PEN NEEDLES) 32G X 4 MM MISC To be used with insulin as instructed (Patient not taking: Reported on 05/02/2023)   [DISCONTINUED] insulin aspart (NOVOLOG) 100 UNIT/ML injection Inject 6 Units into the skin 3 (three) times daily with meals.   [DISCONTINUED] Insulin Glargine (BASAGLAR KWIKPEN) 100 UNIT/ML Inject 50 Units into the skin at bedtime. (Patient not taking: Reported on 05/02/2023)   [DISCONTINUED] insulin lispro (HUMALOG KWIKPEN) 100 UNIT/ML KwikPen Inject 1-12  units into the skin three times daily before meals per sliding scale (Patient not taking: Reported on 05/02/2023)   [DISCONTINUED] insulin lispro (HUMALOG KWIKPEN) 100 UNIT/ML KwikPen Inject 1-12 units into the skin three times daily before meals per sliding scale   [DISCONTINUED] Omeprazole 20 MG TBEC Take 1 tablet (20 mg total) by mouth daily.   No facility-administered encounter medications on file as of  05/02/2023.    Review of Systems  Review of Systems  Constitutional: Negative.   HENT: Negative.    Cardiovascular: Negative.   Gastrointestinal: Negative.   Allergic/Immunologic: Negative.   Neurological: Negative.   Psychiatric/Behavioral: Negative.       Objective:   BP (!) 126/94   Pulse (!) 101   Temp 98.2 F (36.8 C) (Oral)   Wt 158 lb 3.2 oz (71.8 kg)   SpO2 98%   BMI 24.78 kg/m   Wt Readings from Last 5 Encounters:  05/02/23 158 lb 3.2 oz (71.8 kg)  06/10/22 160 lb (72.6 kg)  01/11/22 160 lb (72.6 kg)  01/09/22 160 lb (72.6 kg)  04/16/21 185 lb (83.9 kg)     Physical Exam Vitals and nursing note reviewed.  Constitutional:      General: She is not in acute distress.    Appearance: She is well-developed.  Cardiovascular:     Rate and Rhythm: Normal rate and regular rhythm.  Pulmonary:     Effort: Pulmonary effort is normal.     Breath sounds: Normal breath sounds.  Skin:    Findings: Abscess (left hand abscess) present.  Neurological:     Mental Status: She is alert and oriented to person, place, and time.       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  Dispense: 15 mL; Refill: 0 -     Insulin Lispro (1 Unit Dial); Inject 1-12 units into the skin three times daily before meals per sliding scale  Dispense: 15 mL; Refill: 1 -     CBC -     Comprehensive metabolic panel with GFR  Decreased vision -     Ambulatory referral to Ophthalmology  Cutaneous abscess of left hand -     Doxycycline Hyclate; Take 1 tablet (100 mg total) by mouth 2 (two) times daily for 10 days.  Dispense: 20 tablet; Refill: 0  Lipid screening -     Lipid panel  Peripheral edema -     Ambulatory referral  to Vascular Surgery     Return in about 3 months (around 08/01/2023).   Ivonne Andrew, NP 05/02/2023

## 2023-05-03 LAB — COMPREHENSIVE METABOLIC PANEL WITH GFR
ALT: 16 IU/L (ref 0–32)
AST: 22 IU/L (ref 0–40)
Albumin: 4.3 g/dL (ref 3.9–4.9)
Alkaline Phosphatase: 171 IU/L — ABNORMAL HIGH (ref 44–121)
BUN/Creatinine Ratio: 19 (ref 9–23)
BUN: 14 mg/dL (ref 6–20)
Bilirubin Total: 0.3 mg/dL (ref 0.0–1.2)
CO2: 25 mmol/L (ref 20–29)
Calcium: 9.7 mg/dL (ref 8.7–10.2)
Chloride: 97 mmol/L (ref 96–106)
Creatinine, Ser: 0.75 mg/dL (ref 0.57–1.00)
Globulin, Total: 3.5 g/dL (ref 1.5–4.5)
Glucose: 61 mg/dL — ABNORMAL LOW (ref 70–99)
Potassium: 5 mmol/L (ref 3.5–5.2)
Sodium: 138 mmol/L (ref 134–144)
Total Protein: 7.8 g/dL (ref 6.0–8.5)
eGFR: 107 mL/min/{1.73_m2} (ref >59)

## 2023-05-03 LAB — CBC
Hematocrit: 40.5 % (ref 34.0–46.6)
Hemoglobin: 13.5 g/dL (ref 11.1–15.9)
MCH: 28.7 pg (ref 26.6–33.0)
MCHC: 33.3 g/dL (ref 31.5–35.7)
MCV: 86 fL (ref 79–97)
Platelets: 353 10*3/uL (ref 150–450)
RBC: 4.71 x10E6/uL (ref 3.77–5.28)
RDW: 12.7 % (ref 11.7–15.4)
WBC: 6 10*3/uL (ref 3.4–10.8)

## 2023-05-03 LAB — LIPID PANEL
Chol/HDL Ratio: 1.9 ratio (ref 0.0–4.4)
Cholesterol, Total: 125 mg/dL (ref 100–199)
HDL: 66 mg/dL (ref >39)
LDL Chol Calc (NIH): 45 mg/dL (ref 0–99)
Triglycerides: 65 mg/dL (ref 0–149)
VLDL Cholesterol Cal: 14 mg/dL (ref 5–40)

## 2023-05-04 LAB — MICROALBUMIN / CREATININE URINE RATIO
Creatinine, Urine: 166.9 mg/dL
Microalb/Creat Ratio: 30 mg/g{creat} — ABNORMAL HIGH (ref 0–29)
Microalbumin, Urine: 50.8 ug/mL

## 2023-05-07 LAB — TOXASSURE FLEX 15, UR
6-ACETYLMORPHINE IA: NEGATIVE ng/mL
AMPHETAMINES IA: NEGATIVE ng/mL
BARBITURATES IA: NEGATIVE ng/mL
Creatinine: 173 mg/dL
ETHYL ALCOHOL Enzymatic: NEGATIVE g/dL
OPIATE CLASS IA: NEGATIVE ng/mL
OXYCODONE CLASS IA: NEGATIVE ng/mL
PHENCYCLIDINE IA: NEGATIVE ng/mL
TAPENTADOL, IA: NEGATIVE ng/mL
TRAMADOL IA: NEGATIVE ng/mL

## 2023-05-07 LAB — COCAINE AND MTB, MS, UR RFX
Benzoylecgonine: >2890 ng/mg{creat}
Cocaethylene: NOT DETECTED ng/mg{creat}
Cocaine Confirmation: POSITIVE
Cocaine: >2890 ng/mg{creat}

## 2023-05-07 LAB — CANNABINOIDS, MS, UR RFX
Cannabinoids Confirmation: POSITIVE
Carboxy-THC: 66 ng/mg{creat}

## 2023-05-07 LAB — METHADONE, MS, UR RFX
EDDP (Methadone Mtb): 931 ng/mg{creat}
Methadone Confirmation: POSITIVE
Methadone: 94 ng/mg{creat}

## 2023-05-15 ENCOUNTER — Other Ambulatory Visit (HOSPITAL_COMMUNITY): Payer: Self-pay

## 2023-05-16 ENCOUNTER — Other Ambulatory Visit (HOSPITAL_COMMUNITY): Payer: Self-pay

## 2023-07-01 ENCOUNTER — Other Ambulatory Visit: Payer: Self-pay | Admitting: *Deleted

## 2023-07-01 DIAGNOSIS — M7989 Other specified soft tissue disorders: Secondary | ICD-10-CM

## 2023-07-18 ENCOUNTER — Ambulatory Visit (INDEPENDENT_AMBULATORY_CARE_PROVIDER_SITE_OTHER): Payer: MEDICAID | Admitting: Physician Assistant

## 2023-07-18 ENCOUNTER — Ambulatory Visit (HOSPITAL_COMMUNITY)
Admission: RE | Admit: 2023-07-18 | Discharge: 2023-07-18 | Disposition: A | Discharge status: Home / Self Care | Payer: MEDICAID | Source: Ambulatory Visit | Attending: Vascular Surgery | Admitting: Vascular Surgery

## 2023-07-18 VITALS — BP 134/90 | HR 71 | Temp 97.9°F | Ht 67.0 in | Wt 157.9 lb

## 2023-07-18 DIAGNOSIS — M7989 Other specified soft tissue disorders: Secondary | ICD-10-CM

## 2023-07-18 NOTE — Progress Notes (Signed)
 [facility]           [facility] [place]  History and Physical   Anna Dennis is a 33 y.o. female who presents with BLE swelling.    She states that both of her legs swell and improve with leg elevation.  She does not have hx of DVT.  She does have some skin color changes on both legs distally that she states has been present for a couple of years.  She does not wear compression socks.  She does have family hx of varicose veins with her mother.  She has never had any procedures on her legs.  She does smoke but is trying to quit.   She does have a heart murmur but no other cardiac issues.  Her renal function was normal in April.    She works at [employer] and is on her feet most of the day.    She did have a DVT study May 2024 that was negative.   The pt is not on a statin for cholesterol management.  The pt is not on a daily aspirin.   Other AC:  none The pt is not on medication for hypertension.   The pt is  on medication for diabetes.   Tobacco hx:  current  Pt does not have family hx of AAA.  Past Medical History:  Diagnosis Date   Depression    Diabetes mellitus    Insulin  pump   GERD (gastroesophageal reflux disease)    Hepatitis C    Heroin abuse (HCC)    Iron deficiency anemia    IVDU (intravenous drug user)    OCD (obsessive compulsive disorder)     Past Surgical History:  Procedure Laterality Date   I & D EXTREMITY Left 07/11/2020   Procedure: IRRIGATION AND DEBRIDEMENT EXTREMITY;  Surgeon: Donnamarie Gables, MD;  Location: [facility] OR;  Service: Orthopedics;  Laterality: Left;   I & D EXTREMITY Left 08/27/2020   Procedure: IRRIGATION AND DEBRIDEMENT HAND;  Surgeon: Barb Bonito, MD;  Location: [facility] OR;  Service: Plastics;  Laterality: Left;   SKIN SPLIT GRAFT Left 04/16/2021   Procedure: SKIN GRAFT SPLIT THICKNESS FROM LEFT THIGH TO LEFT UPPER ARM;  Surgeon: Barb Bonito, MD;  Location: [facility] OR;  Service: Plastics;  Laterality: Left;     Social History   Socioeconomic History   Marital status: Single    Spouse name: Not on file   Number of children: Not on file   Years of education: Not on file   Highest education level: Not on file  Occupational History   Not on file  Tobacco Use   Smoking status: Every Day    Types: Cigarettes   Smokeless tobacco: Never  Vaping Use   Vaping status: Never Used  Substance and Sexual Activity   Alcohol use: Yes    Alcohol/week: 0.0 standard drinks of alcohol    Comment: occasional   Drug use: Not Currently    Comment: patient states clean since 03/29/14   Sexual activity: Not Currently  Other Topics Concern   Not on file  Social History Narrative   Not on file   Social Drivers of Health   Financial Resource Strain: Not on file  Food Insecurity: No Food Insecurity (05/02/2023)   Hunger Vital Sign    Worried About Running Out of Food in the Last Year: Never true    Ran Out of Food in  the Last Year: Never true  Transportation Needs: Unmet Transportation Needs (05/02/2023)   PRAPARE - Administrator, Civil Service (Medical): Yes    Lack of Transportation (Non-Medical): Yes  Physical Activity: Not on file  Stress: Not on file  Social Connections: Not on file  Intimate Partner Violence: Not on file     Family History  Problem Relation Age of Onset   Bipolar disorder Mother    Schizophrenia Sister     Current Outpatient Medications  Medication Sig Dispense Refill   amoxicillin -clavulanate (AUGMENTIN ) 875-125 MG tablet Take 1 tablet by mouth every 12 (twelve) hours. (Patient not taking: Reported on 05/02/2023) 14 tablet 0   glucose blood (GLUCOSE METER TEST) test strip Use as instructed (Patient not taking: Reported on 05/02/2023) 100 each 5   Insulin  Glargine (BASAGLAR  KWIKPEN) 100 UNIT/ML Inject 50 Units into the skin at bedtime. 15 mL 0   insulin  lispro (HUMALOG  KWIKPEN) 100 UNIT/ML KwikPen Inject 1-12 units into the skin three times daily before meals per  sliding scale 15 mL 1   Insulin  Pen Needle (MM PEN NEEDLES) 32G X 4 MM MISC To be used with insulin  as instructed (Patient not taking: Reported on 05/02/2023) 100 each 0   insulin  regular (NOVOLIN R) 100 units/mL injection Inject 50 Units into the skin at bedtime.     No current facility-administered medications for this visit.    No Known Allergies  REVIEW OF SYSTEMS:   [X]  denotes positive finding, [ ]  denotes negative finding Cardiac  Comments:  Chest pain or chest pressure:    Shortness of breath upon exertion:    Short of breath when lying flat:    Irregular heart rhythm:        Vascular    Pain in calf, thigh, or hip brought on by ambulation:    Pain in feet at night that wakes you up from your sleep:     Blood clot in your veins:    Leg swelling:  x       Pulmonary    Oxygen at home:    Productive cough:     Wheezing:         Neurologic    Sudden weakness in arms or legs:     Sudden numbness in arms or legs:     Sudden onset of difficulty speaking or slurred speech:    Temporary loss of vision in one eye:     Problems with dizziness:         Gastrointestinal    Blood in stool:     Vomited blood:         Genitourinary    Burning when urinating:     Blood in urine:        Psychiatric    Major depression:         Hematologic    Bleeding problems:    Problems with blood clotting too easily:        Skin    Rashes or ulcers:        Constitutional    Fever or chills:      PHYSICAL EXAMINATION:  Today's Vitals   07/18/23 1032  BP: (!) 134/90  Pulse: 71  Temp: 97.9 F (36.6 C)  TempSrc: Temporal  SpO2: 97%  Weight: 157 lb 14.4 oz (71.6 kg)  Height: 5' 7 (1.702 m)  PainSc: 0-No pain   Body mass index is 24.73 kg/m.   General:  WDWN in NAD; vital signs  documented above Gait: Not observed HENT: WNL, normocephalic Pulmonary: normal non-labored breathing without wheezing Cardiac: regular HR; without carotid bruits Abdomen: soft, NT, aortic  pulse is not palpable Skin: without rashes Vascular Exam/Pulses:  Right Left  Radial 2+ (normal) 2+ (normal)  DP 2+ (normal) 2+ (normal)   Extremities: BLE swelling with skin color changes distally both legs.    Neurologic: A&O X 3;  moving all extremities equally Psychiatric:  The pt has flat affect.   Non-Invasive Vascular Imaging:   Venous duplex on 07/18/2023: Venous Reflux Times  +--------------+---------+------+-----------+------------+--------+  RIGHT        Reflux NoRefluxReflux TimeDiameter cmsComments                          Yes                                   +--------------+---------+------+-----------+------------+--------+  CFV          no                                              +--------------+---------+------+-----------+------------+--------+  FV mid                  yes                                   +--------------+---------+------+-----------+------------+--------+  Popliteal    no                                              +--------------+---------+------+-----------+------------+--------+  GSV at [facility]    no                            0.8               +--------------+---------+------+-----------+------------+--------+  GSV prox thighno                            0.43              +--------------+---------+------+-----------+------------+--------+  GSV mid thigh           yes                 0.32              +--------------+---------+------+-----------+------------+--------+  GSV dist thigh          yes                 0.33              +--------------+---------+------+-----------+------------+--------+  GSV at knee   no                            0.34              +--------------+---------+------+-----------+------------+--------+  GSV prox calf no  0.26              +--------------+---------+------+-----------+------------+--------+  GSV mid  calf  no                            0.28              +--------------+---------+------+-----------+------------+--------+  SSV Pop Fossa no                            0.17              +--------------+---------+------+-----------+------------+--------+  SSV prox calf           yes                 0.16              +--------------+---------+------+-----------+------------+--------+   Summary:  Right:  - No evidence of deep vein thrombosis seen in the right lower extremity,  from the common femoral through the popliteal veins.  - No evidence of superficial venous thrombosis in the right lower extremity.  - Venous reflux is noted in the right greater saphenous vein in the thigh.  - Venous reflux is noted in the right femoral vein.  - Venous reflux is noted in the right short saphenous vein.     Aiyah Scarpelli is a 33 y.o. female who presents with: BLE swelling    -pt has easily palpable DP pedal pulses bilaterally -pt does not have evidence of DVT.  Pt does have venous reflux in the right deep femoral vein and GSV in the mid and distal thigh as well as the SSV in the calf.  She does not have reflux at the [facility] and the vein is not adequate size for laser ablation.   -discussed with pt about wearing knee high 15-20 mmHg compression stockings and pt was measured for these today.   Discussed putting these on in the morning before getting out of bed and taking them off at night.   -discussed the importance of leg elevation and how to elevate properly - pt is advised to elevate their legs and a diagram is given to them to demonstrate for pt to lay flat on their back with knees elevated and slightly bent with their feet higher than their knees, which puts their feet higher than their heart for 15 minutes per day.  If pt cannot lay flat, advised to lay as flat as possible.  -pt is advised to continue as much walking as possible and avoid sitting or standing for long periods of time.   -discussed importance of exercise and that water aerobics would also be beneficial.  -handout with recommendations given -pt will f/u as needed.  She will try the recommendations above to see if she gets some relief.  If she does not, we can see her back to evaluate the left leg.  She is in agreement with this plan.     Maryanna Smart, [facility] [phone]  Clinic MD:  Susi Eric

## 2023-08-06 ENCOUNTER — Ambulatory Visit: Payer: Self-pay | Admitting: Nurse Practitioner

## 2023-08-11 ENCOUNTER — Other Ambulatory Visit: Payer: Self-pay | Admitting: Nurse Practitioner

## 2023-08-11 DIAGNOSIS — E1065 Type 1 diabetes mellitus with hyperglycemia: Secondary | ICD-10-CM

## 2023-08-11 MED ORDER — INSULIN LISPRO (1 UNIT DIAL) 100 UNIT/ML (KWIKPEN): PEN_INJECTOR | SUBCUTANEOUS | 1 refills | Status: DC

## 2023-08-11 MED ORDER — BASAGLAR KWIKPEN 100 UNIT/ML ~~LOC~~ SOPN: 50.0000 [IU] | PEN_INJECTOR | Freq: Every day | SUBCUTANEOUS | 0 refills | Status: DC

## 2023-08-11 NOTE — Telephone Encounter (Signed)
 Copied from CRM [id number]. Topic: Clinical - Medication Refill >> Aug 11, 2023 10:20 AM Turkey B wrote: Medication: insulin  lispro (HUMALOG  KWIKPEN) 100 UNIT/ML KwikPen/Insulin  Glargine (BASAGLAR  KWIKPEN) 100 UNIT/ML  Has the patient contacted their pharmacy? yes (Agent: If yes, when and what did the pharmacy advise?)contact pcp  This is the patient's preferred pharmacy:    [id number] [place], [place] - [address] AT [facility] [address] [facility] [address] [place] [address] Phone: [phone] Fax: [phone]  Is this the correct pharmacy for this prescription? yes Has the prescription been filled recently? no  Is the patient out of the medication? yes  Has the patient been seen for an appointment in the last year OR does the patient have an upcoming appointment? yes  Can we respond through MyChart? yes  Agent: Please be advised that Rx refills may take up to 3 business days. We ask that you follow-up with your pharmacy.

## 2023-08-25 ENCOUNTER — Ambulatory Visit: Payer: Self-pay | Admitting: Nurse Practitioner

## 2023-10-02 ENCOUNTER — Ambulatory Visit: Payer: Self-pay | Admitting: Nurse Practitioner

## 2023-10-24 ENCOUNTER — Ambulatory Visit (HOSPITAL_COMMUNITY)
Admission: EM | Admit: 2023-10-24 | Discharge: 2023-10-24 | Disposition: A | Discharge status: Home / Self Care | Payer: MEDICAID

## 2023-10-24 ENCOUNTER — Encounter (HOSPITAL_COMMUNITY): Payer: Self-pay | Admitting: *Deleted

## 2023-10-24 ENCOUNTER — Inpatient Hospital Stay (HOSPITAL_COMMUNITY)
Admission: EM | Admit: 2023-10-24 | Discharge: 2023-10-29 | DRG: 603 | Disposition: A | Discharge status: Rehabilitation Facility | Payer: MEDICAID | Attending: Family Medicine | Admitting: Family Medicine

## 2023-10-24 ENCOUNTER — Emergency Department (HOSPITAL_COMMUNITY): Payer: MEDICAID

## 2023-10-24 ENCOUNTER — Other Ambulatory Visit: Payer: Self-pay

## 2023-10-24 DIAGNOSIS — F1910 Other psychoactive substance abuse, uncomplicated: Secondary | ICD-10-CM | POA: Diagnosis present

## 2023-10-24 DIAGNOSIS — Z7151 Drug abuse counseling and surveillance of drug abuser: Secondary | ICD-10-CM

## 2023-10-24 DIAGNOSIS — D72829 Elevated white blood cell count, unspecified: Secondary | ICD-10-CM | POA: Diagnosis present

## 2023-10-24 DIAGNOSIS — F17210 Nicotine dependence, cigarettes, uncomplicated: Secondary | ICD-10-CM | POA: Diagnosis present

## 2023-10-24 DIAGNOSIS — Z5982 Transportation insecurity: Secondary | ICD-10-CM

## 2023-10-24 DIAGNOSIS — E1069 Type 1 diabetes mellitus with other specified complication: Secondary | ICD-10-CM | POA: Diagnosis not present

## 2023-10-24 DIAGNOSIS — R000 Tachycardia, unspecified: Secondary | ICD-10-CM | POA: Diagnosis present

## 2023-10-24 DIAGNOSIS — L03113 Cellulitis of right upper limb: Secondary | ICD-10-CM

## 2023-10-24 DIAGNOSIS — Z794 Long term (current) use of insulin: Secondary | ICD-10-CM

## 2023-10-24 DIAGNOSIS — F32A Depression, unspecified: Secondary | ICD-10-CM | POA: Diagnosis present

## 2023-10-24 DIAGNOSIS — Z9641 Presence of insulin pump (external) (internal): Secondary | ICD-10-CM | POA: Diagnosis present

## 2023-10-24 DIAGNOSIS — Z79899 Other long term (current) drug therapy: Secondary | ICD-10-CM

## 2023-10-24 DIAGNOSIS — N179 Acute kidney failure, unspecified: Secondary | ICD-10-CM | POA: Diagnosis present

## 2023-10-24 DIAGNOSIS — M25431 Effusion, right wrist: Secondary | ICD-10-CM | POA: Diagnosis present

## 2023-10-24 DIAGNOSIS — E1065 Type 1 diabetes mellitus with hyperglycemia: Secondary | ICD-10-CM | POA: Diagnosis present

## 2023-10-24 DIAGNOSIS — F1990 Other psychoactive substance use, unspecified, uncomplicated: Secondary | ICD-10-CM | POA: Diagnosis present

## 2023-10-24 DIAGNOSIS — F429 Obsessive-compulsive disorder, unspecified: Secondary | ICD-10-CM | POA: Diagnosis present

## 2023-10-24 LAB — COMPREHENSIVE METABOLIC PANEL WITH GFR
ALT: 58 U/L — ABNORMAL HIGH (ref 0–44)
AST: 39 U/L (ref 15–41)
Albumin: 3.2 g/dL — ABNORMAL LOW (ref 3.5–5.0)
Alkaline Phosphatase: 152 U/L — ABNORMAL HIGH (ref 38–126)
Anion gap: 14 (ref 5–15)
BUN: 18 mg/dL (ref 6–20)
CO2: 22 mmol/L (ref 22–32)
Calcium: 8.6 mg/dL — ABNORMAL LOW (ref 8.9–10.3)
Chloride: 98 mmol/L (ref 98–111)
Creatinine, Ser: 1.34 mg/dL — ABNORMAL HIGH (ref 0.44–1.00)
GFR, Estimated: 53 mL/min — ABNORMAL LOW (ref >60)
Glucose, Bld: 341 mg/dL — ABNORMAL HIGH (ref 70–99)
Potassium: 4.1 mmol/L (ref 3.5–5.1)
Sodium: 134 mmol/L — ABNORMAL LOW (ref 135–145)
Total Bilirubin: 0.6 mg/dL (ref 0.0–1.2)
Total Protein: 6.8 g/dL (ref 6.5–8.1)

## 2023-10-24 LAB — CBC WITH DIFFERENTIAL/PLATELET
Abs Immature Granulocytes: 0.04 K/uL (ref 0.00–0.07)
Basophils Absolute: 0 K/uL (ref 0.0–0.1)
Basophils Relative: 0 %
Eosinophils Absolute: 0.1 K/uL (ref 0.0–0.5)
Eosinophils Relative: 1 %
HCT: 37.9 % (ref 36.0–46.0)
Hemoglobin: 12.1 g/dL (ref 12.0–15.0)
Immature Granulocytes: 0 %
Lymphocytes Relative: 17 %
Lymphs Abs: 1.9 K/uL (ref 0.7–4.0)
MCH: 29.4 pg (ref 26.0–34.0)
MCHC: 31.9 g/dL (ref 30.0–36.0)
MCV: 92.2 fL (ref 80.0–100.0)
Monocytes Absolute: 0.5 K/uL (ref 0.1–1.0)
Monocytes Relative: 5 %
Neutro Abs: 8.5 K/uL — ABNORMAL HIGH (ref 1.7–7.7)
Neutrophils Relative %: 77 %
Platelets: 308 K/uL (ref 150–400)
RBC: 4.11 MIL/uL (ref 3.87–5.11)
RDW: 13.4 % (ref 11.5–15.5)
WBC: 11.1 K/uL — ABNORMAL HIGH (ref 4.0–10.5)
nRBC: 0 % (ref 0.0–0.2)

## 2023-10-24 NOTE — Discharge Instructions (Signed)
 You were seen today for concerns of significant swelling of your right hand and forearm following injection of cocaine  into that area.  At this time I am concerned that you have a severe infection that requires IV antibiotics.  This infection plus your pre-existing history of diabetes can cause issues with appropriate healing so I recommend that you be seen in the emergency room for management and monitoring.  Please go directly to the emergency room.  Please

## 2023-10-24 NOTE — ED Provider Notes (Signed)
 [facility]    CSN: [id number] Arrival date & time: 10/24/23  1653      History   Chief Complaint No chief complaint on file.   HPI Anna Dennis is a 33 y.o. female.  has a past medical history of Depression, Diabetes mellitus, GERD (gastroesophageal reflux disease), Hepatitis C, Heroin abuse (HCC), Iron deficiency anemia, IVDU (intravenous drug user), and OCD (obsessive compulsive disorder).   HPI The patient, with type 1 diabetes, presents with concerns for  rightwrist abscess. She has had swelling of  her right  wrist for a few days, which is swollen and painful. The pain persists even without movement. She injected cocaine  into her wrist, which she believes may have caused the abscess. She has type 1 diabetes, which tends to exacerbate infections. She is currently using insulin  for diabetes management but did not provide a recent blood sugar reading. During the review of symptoms, the patient reports that movement of the wrist and fingers is painful.   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   Hyperkalemia 09/05/2015   Syncope 09/05/2015   Acute kidney injury (nontraumatic)    Heroin abuse (HCC)    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) 08/10/2014   Dehydration 02/20/2012   Sinus tachycardia 02/20/2012   Leukocytosis 02/19/2012   GERD (gastroesophageal reflux disease) 02/19/2012   DKA (diabetic ketoacidosis) (HCC) 01/19/2012   Type 1  diabetes mellitus with hyperosmolarity without nonketotic hyperglycemic hyperosmolar coma (HCC) 03/14/2009   Depression 03/14/2009    Past Surgical History:  Procedure Laterality Date   I & D EXTREMITY Left 07/11/2020   Procedure: IRRIGATION AND DEBRIDEMENT EXTREMITY;  Surgeon: Elsa Lonni SAUNDERS, MD;  Location: [facility] OR;  Service: Orthopedics;  Laterality: Left;   I & D EXTREMITY Left 08/27/2020   Procedure: IRRIGATION AND DEBRIDEMENT HAND;  Surgeon: Elisabeth Craig RAMAN, MD;  Location: [facility] OR;  Service: Plastics;  Laterality: Left;   SKIN SPLIT GRAFT Left 04/16/2021   Procedure: SKIN GRAFT SPLIT THICKNESS FROM LEFT THIGH TO LEFT UPPER ARM;  Surgeon: Elisabeth Craig RAMAN, MD;  Location: [facility] OR;  Service: Plastics;  Laterality: Left;    OB History   No obstetric history on file.      Home Medications    Prior to Admission medications   Medication Sig Start Date End Date Taking? Authorizing Provider  glucose blood (GLUCOSE METER TEST) test strip Use as instructed 07/14/20  Yes Lue Elsie BROCKS, MD  insulin  lispro (HUMALOG  KWIKPEN) 100 UNIT/ML KwikPen Inject 1-12 units into the skin three times daily before meals per sliding scale 08/11/23  Yes Oley Bascom RAMAN, NP  Insulin  Pen Needle (MM PEN NEEDLES) 32G X 4 MM MISC To be used with insulin  as instructed 01/11/22  Yes Ellouise, Victoria K, DO  amoxicillin -clavulanate (AUGMENTIN ) 875-125 MG tablet Take 1 tablet by mouth every 12 (twelve) hours. 03/16/23   Johnie Rumaldo LABOR,  NP  Insulin  Glargine (BASAGLAR  KWIKPEN) 100 UNIT/ML Inject 50 Units into the skin at bedtime. 08/11/23 09/10/23  Oley Bascom RAMAN, NP  insulin  regular (NOVOLIN R) 100 units/mL injection Inject 50 Units into the skin at bedtime.    [provider]  insulin  aspart (NOVOLOG ) 100 UNIT/ML injection Inject 6 Units into the skin 3 (three) times daily with meals. 02/22/12 02/28/12  Abrol, Nayana, MD  Omeprazole  20 MG TBEC Take 1 tablet (20 mg total) by mouth daily. 02/28/12 03/04/12   Vicci Afton CROME, MD    Family History Family History  Problem Relation Age of Onset   Bipolar disorder Mother    Schizophrenia Sister     Social History Social History   Tobacco Use   Smoking status: Every Day    Types: Cigarettes   Smokeless tobacco: Never  Vaping Use   Vaping status: Never Used  Substance Use Topics   Alcohol use: Yes    Alcohol/week: 0.0 standard drinks of alcohol    Comment: occasional   Drug use: Not Currently    Comment: patient states clean since 03/29/14     Allergies   Patient has no known allergies.   Review of Systems Review of Systems  Skin:  Positive for color change.       Swelling and redness of the right hand and wrist      Physical Exam Triage Vital Signs ED Triage Vitals  Encounter Vitals Group     BP 10/24/23 1714 (!) 165/100     Girls Systolic BP Percentile --      Girls Diastolic BP Percentile --      Boys Systolic BP Percentile --      Boys Diastolic BP Percentile --      Pulse Rate 10/24/23 1714 98     Resp 10/24/23 1714 20     Temp 10/24/23 1714 98.5 F (36.9 C)     Temp src --      SpO2 10/24/23 1714 90 %     Weight --      Height --      Head Circumference --      Peak Flow --      Pain Score 10/24/23 1711 8     Pain Loc --      Pain Education --      Exclude from Growth Chart --    No data found.  Updated Vital Signs BP (!) 165/100   Pulse 98   Temp 98.5 F (36.9 C)   Resp 20   LMP  (LMP Unknown)   SpO2 90%   Visual Acuity Right Eye Distance:   Left Eye Distance:   Bilateral Distance:    Right Eye Near:   Left Eye Near:    Bilateral Near:     Physical Exam Vitals reviewed.  Constitutional:      General: She is not in acute distress.    Appearance: She is underweight. She is not ill-appearing, toxic-appearing or diaphoretic.     Comments: Patient is lethargic but easily aroused during exam.  She is cooperative but frequently inattentive  HENT:     Head: Normocephalic and atraumatic.   Eyes:     General: Lids are normal. Gaze aligned appropriately.     Extraocular Movements: Extraocular movements intact.     Conjunctiva/sclera: Conjunctivae normal.  Cardiovascular:     Rate and Rhythm: Normal rate and regular rhythm.     Heart sounds: Normal heart sounds. No murmur heard.  No friction rub. No gallop.  Pulmonary:     Effort: Pulmonary effort is normal.     Breath sounds: Normal breath sounds. No decreased air movement. No decreased breath sounds, wheezing, rhonchi or rales.  Musculoskeletal:     Right forearm: Swelling and tenderness present. No deformity or lacerations.     Right wrist: Swelling and tenderness present. Decreased range of motion. Normal pulse.     Right hand: Swelling and tenderness present. Decreased range of motion. Normal capillary refill.     Right lower leg: Edema present.     Left lower leg: Edema present.     Comments: Patient has significant swelling of the distal aspect of the right forearm, wrist, hand.  Appears to be swollen to twice the size of the left extremity.  There is redness particularly at the distal portion of the hand and fingers.  Right radial pulse is 2+ and brisk and symmetrical with the left.  She has 2 to 3-second capillary refill at the distal aspect of multiple fingers on the right hand.  She is able to partially flex her fingers and extend but is not able to make a fist.  She is not able to flex or extend her wrist and is keeping it largely straight on her lap.  She is not able to laterally flex her wrist at all.  Neurological:     Mental Status: She is oriented to person, place, and time and easily aroused. She is lethargic.  Psychiatric:        Attention and Perception: Attention and perception normal.        Mood and Affect: Mood and affect normal.        Speech: Speech normal.        Behavior: Behavior normal. Behavior is cooperative.      UC Treatments / Results  Labs (all labs ordered are listed, but only  abnormal results are displayed) Labs Reviewed - No data to display  EKG   Radiology No results found.  Procedures Procedures (including critical care time)  Medications Ordered in UC Medications - No data to display  Initial Impression / Assessment and Plan / UC Course  I have reviewed the triage vital signs and the nursing notes.  Pertinent labs & imaging results that were available during my care of the patient were reviewed by me and considered in my medical decision making (see chart for details).      Final Clinical Impressions(s) / UC Diagnoses   Final diagnoses:  Drug abuse (HCC)  Type 1 diabetes mellitus with other specified complication (HCC)  Cellulitis of right upper extremity   Patient is here today for concerns of right hand and wrist swelling has been ongoing for several days.  She admits to injecting cocaine  into her right wrist and thinks that this is what caused the swelling.  Her mother states that they have plans to send patient to rehab in about a week as methadone  does not seem to be effective at managing patient's drug use.  She reports concerns for an abscess and states that she has a history of type 1 diabetes.  Unsure if she is actively/appropriately managing her diabetes as her last prescription for insulin  was in July.  I am concerned for potential exacerbation of infection due to unmanaged diabetes at this point.  She has significant swelling of the distal aspect of the right forearm, right wrist and right hand and is almost 2 times bigger than the left.  She has decreased range of motion but appears to have intact blood flow with capillary refill intact at the fingers and normal radial pulse.  I reviewed with patient that given her recent intravenous drug use, medical history and concern for severe infection I recommend that she goes to the emergency room for IV antibiotics.  Since patient is lethargic and appears to be in and out of consciousness I asked  her if she can get to the emergency room on her own.  She states that her mother is here with her so her mother was brought into the exam room and informed of the current plan.  Her mother states that she is comfortable taking patient to the emergency room to ensure that she gets appropriate management.  Reviewed that if they are unable to make it to the emergency room they should call 911 for assistance.  Recommend close follow-up with PCP for ongoing diabetes management.    Discharge Instructions      You were seen today for concerns of significant swelling of your right hand and forearm following injection of cocaine  into that area.  At this time I am concerned that you have a severe infection that requires IV antibiotics.  This infection plus your pre-existing history of diabetes can cause issues with appropriate healing so I recommend that you be seen in the emergency room for management and monitoring.  Please go directly to the emergency room.  Please     ED Prescriptions   None    PDMP not reviewed this encounter.   Marylene Rocky BRAVO, PA-C 10/24/23 1812

## 2023-10-24 NOTE — ED Provider Triage Note (Signed)
 Emergency Medicine Provider Triage Evaluation Note  Anna Dennis , a 33 y.o. female  was evaluated in triage.  Pt complains of right wrist and hand pain and swelling after injecting cocaine  on Sunday. She states the pain has gotten worse since Tuesday and is now at the point where she feels that there is an abscess under the skin. She was seen at urgent care and due to her history of Type I Diabetes, she was told to come here for further evaluation and IV antibiotics. She denies fever and systemic symptoms.  Review of Systems  Positive: Right hand pain Negative: fever  Physical Exam  BP (!) 154/103   Pulse 99   Temp 99.6 F (37.6 C)   Resp 18   Ht 5' 7 (1.702 m)   Wt 71.6 kg   LMP 10/10/2023   SpO2 94%   BMI 24.72 kg/m  Gen:   Awake, no distress   Resp:  Normal effort  MSK:   Moves extremities. Right hand and wrist movement restricted due to pain. Obvious swelling and erythema on exam Other:    Medical Decision Making  Medically screening exam initiated at 7:28 PM.  Appropriate orders placed.  [name] was informed that the remainder of the evaluation will be completed by another provider, this initial triage assessment does not replace that evaluation, and the importance of remaining in the ED until their evaluation is complete.     Torrence Marry RAMAN, [place] 10/24/23 1931

## 2023-10-24 NOTE — ED Triage Notes (Signed)
 The pt is an iv drug user she has had pain and swelling in her rt wrist since Monday she takes methadone   no injection in the past 24 hours

## 2023-10-24 NOTE — ED Triage Notes (Addendum)
 PT reports she shot up some Cocaine  this week in the RT arm on Monday or Tuesday. Pt now has swelling to Rt arm . Pt skin is also red and warm to touch. The swelling extends to Rt hand and fingers. PT reports she takes Methadone   Pt is sleepy and having a difficult time answering.questions. Pt falls asleep and has to be woke up .

## 2023-10-25 ENCOUNTER — Emergency Department (HOSPITAL_COMMUNITY): Payer: MEDICAID

## 2023-10-25 DIAGNOSIS — L03113 Cellulitis of right upper limb: Secondary | ICD-10-CM | POA: Diagnosis present

## 2023-10-25 DIAGNOSIS — Z5982 Transportation insecurity: Secondary | ICD-10-CM | POA: Diagnosis not present

## 2023-10-25 DIAGNOSIS — F32A Depression, unspecified: Secondary | ICD-10-CM | POA: Diagnosis present

## 2023-10-25 DIAGNOSIS — F429 Obsessive-compulsive disorder, unspecified: Secondary | ICD-10-CM | POA: Diagnosis present

## 2023-10-25 DIAGNOSIS — Z9641 Presence of insulin pump (external) (internal): Secondary | ICD-10-CM | POA: Diagnosis present

## 2023-10-25 DIAGNOSIS — E1065 Type 1 diabetes mellitus with hyperglycemia: Secondary | ICD-10-CM | POA: Diagnosis present

## 2023-10-25 DIAGNOSIS — F1910 Other psychoactive substance abuse, uncomplicated: Secondary | ICD-10-CM | POA: Diagnosis present

## 2023-10-25 DIAGNOSIS — N179 Acute kidney failure, unspecified: Secondary | ICD-10-CM

## 2023-10-25 DIAGNOSIS — R000 Tachycardia, unspecified: Secondary | ICD-10-CM | POA: Diagnosis present

## 2023-10-25 DIAGNOSIS — Z794 Long term (current) use of insulin: Secondary | ICD-10-CM | POA: Diagnosis not present

## 2023-10-25 DIAGNOSIS — D72829 Elevated white blood cell count, unspecified: Secondary | ICD-10-CM

## 2023-10-25 DIAGNOSIS — Z7151 Drug abuse counseling and surveillance of drug abuser: Secondary | ICD-10-CM | POA: Diagnosis not present

## 2023-10-25 DIAGNOSIS — F1990 Other psychoactive substance use, unspecified, uncomplicated: Secondary | ICD-10-CM | POA: Diagnosis present

## 2023-10-25 DIAGNOSIS — M25431 Effusion, right wrist: Secondary | ICD-10-CM | POA: Diagnosis present

## 2023-10-25 DIAGNOSIS — F17210 Nicotine dependence, cigarettes, uncomplicated: Secondary | ICD-10-CM | POA: Diagnosis present

## 2023-10-25 DIAGNOSIS — Z79899 Other long term (current) drug therapy: Secondary | ICD-10-CM | POA: Diagnosis not present

## 2023-10-25 LAB — URINALYSIS, W/ REFLEX TO CULTURE (INFECTION SUSPECTED)
Bacteria, UA: NONE SEEN
Bilirubin Urine: NEGATIVE
Glucose, UA: >=500 mg/dL — AB
Hgb urine dipstick: NEGATIVE
Ketones, ur: NEGATIVE mg/dL
Leukocytes,Ua: NEGATIVE
Nitrite: NEGATIVE
Protein, ur: NEGATIVE mg/dL
Specific Gravity, Urine: 1.036 — ABNORMAL HIGH (ref 1.005–1.030)
pH: 6 (ref 5.0–8.0)

## 2023-10-25 LAB — HIV ANTIBODY (ROUTINE TESTING W REFLEX): HIV Screen 4th Generation wRfx: NONREACTIVE

## 2023-10-25 LAB — GLUCOSE, CAPILLARY
Glucose-Capillary: 231 mg/dL — ABNORMAL HIGH (ref 70–99)
Glucose-Capillary: 312 mg/dL — ABNORMAL HIGH (ref 70–99)
Glucose-Capillary: 363 mg/dL — ABNORMAL HIGH (ref 70–99)

## 2023-10-25 LAB — SEDIMENTATION RATE: Sed Rate: 52 mm/h — ABNORMAL HIGH (ref 0–22)

## 2023-10-25 LAB — HEMOGLOBIN A1C
Hgb A1c MFr Bld: 11 % — ABNORMAL HIGH (ref 4.8–5.6)
Mean Plasma Glucose: 269 mg/dL

## 2023-10-25 LAB — C-REACTIVE PROTEIN: CRP: 4.9 mg/dL — ABNORMAL HIGH (ref <1.0)

## 2023-10-25 MED ORDER — METHADONE HCL 10 MG PO TABS: 150.0000 mg | ORAL_TABLET | Freq: Every day | ORAL | Status: DC

## 2023-10-25 MED ORDER — SODIUM CHLORIDE 0.9 % IV SOLN
1250.0000 mg | INTRAVENOUS | Status: DC
  Administered 2023-10-26: 1250 mg via INTRAVENOUS
  Filled 2023-10-25: qty 25

## 2023-10-25 MED ORDER — VANCOMYCIN HCL 1.5 G IV SOLR
1500.0000 mg | Freq: Once | INTRAVENOUS | Status: AC
  Administered 2023-10-25: 1500 mg via INTRAVENOUS
  Filled 2023-10-25: qty 30

## 2023-10-25 MED ORDER — NICOTINE 21 MG/24HR TD PT24
21.0000 mg | MEDICATED_PATCH | Freq: Once | TRANSDERMAL | Status: AC
  Administered 2023-10-25: 21 mg via TRANSDERMAL
  Filled 2023-10-25: qty 1

## 2023-10-25 MED ORDER — INSULIN ASPART 100 UNIT/ML IJ SOLN
0.0000 [IU] | Freq: Every day | INTRAMUSCULAR | Status: DC
  Administered 2023-10-25: 5 [IU] via SUBCUTANEOUS
  Administered 2023-10-26: 4 [IU] via SUBCUTANEOUS

## 2023-10-25 MED ORDER — SODIUM CHLORIDE 0.9 % IV SOLN: INTRAVENOUS | Status: DC

## 2023-10-25 MED ORDER — ACETAMINOPHEN 650 MG RE SUPP: 650.0000 mg | Freq: Four times a day (QID) | RECTAL | Status: DC | PRN

## 2023-10-25 MED ORDER — ONDANSETRON HCL 4 MG/2ML IJ SOLN: 4.0000 mg | Freq: Four times a day (QID) | INTRAMUSCULAR | Status: DC | PRN

## 2023-10-25 MED ORDER — INSULIN ASPART 100 UNIT/ML IJ SOLN
0.0000 [IU] | Freq: Three times a day (TID) | INTRAMUSCULAR | Status: DC
  Administered 2023-10-25: 11 [IU] via SUBCUTANEOUS
  Administered 2023-10-25: 5 [IU] via SUBCUTANEOUS
  Administered 2023-10-26: 3 [IU] via SUBCUTANEOUS
  Administered 2023-10-26: 2 [IU] via SUBCUTANEOUS
  Administered 2023-10-27: 11 [IU] via SUBCUTANEOUS
  Administered 2023-10-27 (×2): 3 [IU] via SUBCUTANEOUS
  Administered 2023-10-28: 5 [IU] via SUBCUTANEOUS

## 2023-10-25 MED ORDER — ACETAMINOPHEN 325 MG PO TABS
650.0000 mg | ORAL_TABLET | Freq: Four times a day (QID) | ORAL | Status: DC | PRN
  Administered 2023-10-25 – 2023-10-27 (×3): 650 mg via ORAL
  Filled 2023-10-25 (×3): qty 2

## 2023-10-25 MED ORDER — SODIUM CHLORIDE 0.9% FLUSH
3.0000 mL | Freq: Two times a day (BID) | INTRAVENOUS | Status: DC
  Administered 2023-10-25 – 2023-10-28 (×6): 3 mL via INTRAVENOUS

## 2023-10-25 MED ORDER — ONDANSETRON HCL 4 MG PO TABS: 4.0000 mg | ORAL_TABLET | Freq: Four times a day (QID) | ORAL | Status: DC | PRN

## 2023-10-25 MED ORDER — ALBUTEROL SULFATE (2.5 MG/3ML) 0.083% IN NEBU: 2.5000 mg | INHALATION_SOLUTION | Freq: Four times a day (QID) | RESPIRATORY_TRACT | Status: DC | PRN

## 2023-10-25 MED ORDER — INSULIN GLARGINE 100 UNIT/ML ~~LOC~~ SOLN
35.0000 [IU] | Freq: Every day | SUBCUTANEOUS | Status: DC
  Administered 2023-10-25 – 2023-10-28 (×4): 35 [IU] via SUBCUTANEOUS
  Filled 2023-10-25 (×6): qty 0.35

## 2023-10-25 MED ORDER — PIPERACILLIN-TAZOBACTAM 3.375 G IVPB 30 MIN: 3.3750 g | Freq: Three times a day (TID) | INTRAVENOUS | Status: DC

## 2023-10-25 MED ORDER — PIPERACILLIN-TAZOBACTAM 3.375 G IVPB 30 MIN
3.3750 g | Freq: Once | INTRAVENOUS | Status: AC
  Administered 2023-10-25: 3.375 g via INTRAVENOUS
  Filled 2023-10-25: qty 50

## 2023-10-25 MED ORDER — IOHEXOL 350 MG/ML SOLN
75.0000 mL | Freq: Once | INTRAVENOUS | Status: AC | PRN
Start: 2023-10-25 — End: 2023-10-25
  Administered 2023-10-25: 75 mL via INTRAVENOUS

## 2023-10-25 MED ORDER — ENOXAPARIN SODIUM 40 MG/0.4ML IJ SOSY
40.0000 mg | PREFILLED_SYRINGE | INTRAMUSCULAR | Status: DC
  Administered 2023-10-25 – 2023-10-28 (×4): 40 mg via SUBCUTANEOUS
  Filled 2023-10-25 (×4): qty 0.4

## 2023-10-25 MED ORDER — PIPERACILLIN-TAZOBACTAM 3.375 G IVPB
3.3750 g | Freq: Three times a day (TID) | INTRAVENOUS | Status: DC
Start: 2023-10-25 — End: 2023-10-29
  Administered 2023-10-25 – 2023-10-29 (×12): 3.375 g via INTRAVENOUS
  Filled 2023-10-25 (×12): qty 50

## 2023-10-25 MED ORDER — OXYCODONE HCL 5 MG PO TABS
5.0000 mg | ORAL_TABLET | Freq: Once | ORAL | Status: AC
  Administered 2023-10-25: 5 mg via ORAL
  Filled 2023-10-25: qty 1

## 2023-10-25 NOTE — Progress Notes (Signed)
 Ronal Gab, coordinator with law enforcement, at bedside. States that she is taking Nora to [facility] on Tuesday morning. She is wanting to coordinate with our social worker and doctor in order to figure out a plan that works best for the patient. Her number is [phone].

## 2023-10-25 NOTE — ED Provider Notes (Signed)
 [facility]-EM[facility]ENCY DEPT [facility] Emergency Department Provider Note MRN:  [id number]  Arrival date & time: 10/25/23     Chief Complaint   Joint Swelling   History of Present Illness   Anna Dennis is a 33 y.o. year-old female with a history of IV drug use presenting to the ED with chief complaint of joint swelling.  Pain and swelling to the right wrist and hand for the past few days.  Denies fever.  Review of Systems  A thorough review of systems was obtained and all systems are negative except as noted in the HPI and PMH.   Patient's Health History    Past Medical History:  Diagnosis Date   Depression    Diabetes mellitus    Insulin  pump   GERD (gastroesophageal reflux disease)    Hepatitis C    Heroin abuse (HCC)    Iron deficiency anemia    IVDU (intravenous drug user)    OCD (obsessive compulsive disorder)     Past Surgical History:  Procedure Laterality Date   I & D EXTREMITY Left 07/11/2020   Procedure: IRRIGATION AND DEBRIDEMENT EXTREMITY;  Surgeon: Elsa Lonni SAUNDERS, MD;  Location: [facility] OR;  Service: Orthopedics;  Laterality: Left;   I & D EXTREMITY Left 08/27/2020   Procedure: IRRIGATION AND DEBRIDEMENT HAND;  Surgeon: Elisabeth Craig RAMAN, MD;  Location: [facility] OR;  Service: Plastics;  Laterality: Left;   SKIN SPLIT GRAFT Left 04/16/2021   Procedure: SKIN GRAFT SPLIT THICKNESS FROM LEFT THIGH TO LEFT UPPER ARM;  Surgeon: Elisabeth Craig RAMAN, MD;  Location: [facility] OR;  Service: Plastics;  Laterality: Left;    Family History  Problem Relation Age of Onset   Bipolar disorder Mother    Schizophrenia Sister     Social History   Socioeconomic History   Marital status: Single    Spouse name: Not on file   Number of children: Not on file   Years of education: Not on file   Highest education level: Not on file  Occupational History   Not on file  Tobacco Use   Smoking status: Every Day    Types: Cigarettes   Smokeless tobacco: Never  Vaping Use   Vaping status: Never  Used  Substance and Sexual Activity   Alcohol use: Yes    Alcohol/week: 0.0 standard drinks of alcohol    Comment: occasional   Drug use: Not Currently    Comment: patient states clean since 03/29/14   Sexual activity: Not Currently  Other Topics Concern   Not on file  Social History Narrative   Not on file   Social Drivers of Health   Financial Resource Strain: Not on file  Food Insecurity: No Food Insecurity (05/02/2023)   Hunger Vital Sign    Worried About Running Out of Food in the Last Year: Never true    Ran Out of Food in the Last Year: Never true  Transportation Needs: Unmet Transportation Needs (05/02/2023)   PRAPARE - Administrator, Civil Service (Medical): Yes    Lack of Transportation (Non-Medical): Yes  Physical Activity: Not on file  Stress: Not on file  Social Connections: Not on file  Intimate Partner Violence: Not on file     Physical Exam   Vitals:   10/25/23 0221 10/25/23 0513  BP: 132/83 (!) 141/92  Pulse: 99 (!) 104  Resp: (!) 21 15  Temp: 98.9 F (37.2 C) 98.8 F (37.1 C)  SpO2: 98% 97%    CONSTITUTIONAL: Well-appearing,  NAD NEURO/PSYCH:  Alert and oriented x 3, no focal deficits EYES:  eyes equal and reactive ENT/NECK:  no LAD, no JVD CARDIO: Regular rate, well-perfused, normal S1 and S2 PULM:  CTAB no wheezing or rhonchi GI/GU:  non-distended, non-tender MSK/SPINE:  No gross deformities, no edema SKIN:  no rash, atraumatic   *Additional and/or pertinent findings included in MDM below  Diagnostic and Interventional Summary    EKG Interpretation Date/Time:    Ventricular Rate:    PR Interval:    QRS Duration:    QT Interval:    QTC Calculation:   R Axis:      Text Interpretation:         Labs Reviewed  COMPREHENSIVE METABOLIC PANEL WITH GFR - Abnormal; Notable for the following components:      Result Value   Sodium 134 (*)    Glucose, Bld 341 (*)    Creatinine, Ser 1.34 (*)    Calcium 8.6 (*)    Albumin 3.2 (*)     ALT 58 (*)    Alkaline Phosphatase 152 (*)    GFR, Estimated 53 (*)    All other components within normal limits  CBC WITH DIFFERENTIAL/PLATELET - Abnormal; Notable for the following components:   WBC 11.1 (*)    Neutro Abs 8.5 (*)    All other components within normal limits    DG Wrist Complete Right  Final Result    CT WRIST RIGHT W CONTRAST    (Results Pending)  CT HAND RIGHT W CONTRAST    (Results Pending)    Medications  piperacillin -tazobactam (ZOSYN ) IVPB 3.375 g (3.375 g Intravenous New Bag/Given 10/25/23 0618)  Vancomycin  (VANCOCIN ) 1,500 mg in sodium chloride  0.9 % 500 mL IVPB (has no administration in time range)     Procedures  /  Critical Care Procedures  ED Course and Medical Decision Making  Initial Impression and Ddx Concern for cellulitis versus deeper space infection or abscess of the right wrist/hand.  Prominently swollen and and wrist, increased warmth, erythema.  Obtaining CT, providing empiric antibiotics.  Anticipating need for admission.  Past medical/surgical history that increases complexity of ED encounter: IV drug use  Interpretation of Diagnostics I personally reviewed the Laboratory Testing and my interpretation is as follows: No significant blood count or electrolyte disturbance.  CT pending  Patient Reassessment and Ultimate Disposition/Management     Signed out to oncoming provider at shift change.  Patient management required discussion with the following services or consulting groups:  None  Complexity of Problems Addressed Acute illness or injury that poses threat of life of bodily function  Additional Data Reviewed and Analyzed Further history obtained from: None  Additional Factors Impacting ED Encounter Risk Consideration of hospitalization  [name]. Theadore, MD [facility] Emergency Medicine [facility] [email or web address]  Final Clinical Impressions(s) / ED Diagnoses     ICD-10-CM   1. Cellulitis of  right upper extremity  L03.113       ED Discharge Orders     None        Discharge Instructions Discussed with and Provided to Patient:   Discharge Instructions   None      Theadore [name], MD 10/25/23 [phone]

## 2023-10-25 NOTE — ED Notes (Signed)
 Pt is very sleepy but slowly arouseable.  Pt was similar status prior to going out to parking lot.  Unknown if she took anything while out of the building.

## 2023-10-25 NOTE — Progress Notes (Signed)
 Pharmacy Antibiotic Note  Anna Dennis is a 33 y.o. female for which pharmacy has been consulted for vancomycin  dosing for cellulitis.  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 Patient presenting with swelling of rt wrist.  SCr 1.34 - ~0.7-0.8 baseline WBC 11.1; T 98.1; HR 78; RR 16  Plan: Zosyn  per MD Vancomycin  1500 mg once then 1250 mg q24hr (eAUC 465) unless change in renal function Monitor WBC, fever, renal function, cultures De-escalate when able Levels at steady state  Height: 5' 7 (170.2 cm) Weight: 71.6 kg (157 lb 13.6 oz) IBW/kg (Calculated) : 61.6  Temp (24hrs), Avg:98.8 F (37.1 C), Min:98.5 F (36.9 C), Max:99.6 F (37.6 C)  Recent Labs  Lab 10/24/23 1941  WBC 11.1*  CREATININE 1.34*    Estimated Creatinine Clearance: 57.5 mL/min (A) (by C-G formula based on SCr of 1.34 mg/dL (H)).    No Known Allergies  Microbiology results: Pending  Thank you for allowing pharmacy to be a part of this patient's care.  Dorn Buttner, PharmD, BCPS 10/25/2023 11:38 AM ED Clinical Pharmacist -  [phone]

## 2023-10-25 NOTE — ED Provider Notes (Signed)
  [place] EMERGENCY DEPARTMENT AT [facility] Provider Assume Care Note I assumed care of Anna Dennis on 10/25/2023 at 7 AM from Dr. Theadore.   Briefly, Anna Dennis is a 33 y.o. female who: PMHx: IV substance use disorder P/w swollen, erythematous, warm right hand concerning for infectious etiology Status post vancomycin  and Zosyn  Awaiting CT of the hand to evaluate for overt deep space infection  Plan at the time of handoff: Follow-up CT, consider hand consult if overt deep space infection, otherwise admit to hospitalists   Please refer to the original provider's note for additional information regarding the care of [employer].  Reassessment: I personally reassessed the patient: Patient complained of persistent hand pain.   Vital Signs:  ED Triage Vitals  Encounter Vitals Group     BP 10/24/23 1820 (!) 154/103     Girls Systolic BP Percentile --      Girls Diastolic BP Percentile --      Boys Systolic BP Percentile --      Boys Diastolic BP Percentile --      Pulse Rate 10/24/23 1820 99     Resp 10/24/23 1820 18     Temp 10/24/23 1820 99.6 F (37.6 C)     Temp Source 10/25/23 0513 Oral     SpO2 10/24/23 1820 94 %     Weight 10/24/23 1923 157 lb 13.6 oz (71.6 kg)     Height 10/24/23 1923 5' 7 (1.702 m)     Head Circumference --      Peak Flow --      Pain Score 10/24/23 1923 10     Pain Loc --      Pain Education --      Exclude from Growth Chart --      Hemodynamics:  The patient is hemodynamically stable. Mental Status:  The patient is alert  Additional MDM: Patient reviewed CT of the hand, I do not appreciate focal fluid collection, fat stranding, hyperenhancement.  Radiology interpretation without evidence of deep space infection such as abscess, septic arthritis, osteomyelitis, most consistent with cellulitis on their interpretation.  On reevaluation, patient overall well-appearing, given oxycodone  for pain control, medicine consulted for  admission and accepted after discussion with Dr. Claudene.  Unfortunately blood cultures had not previously been ordered prior to antibiotic administration, therefore blood cultures ordered.  No additional acute events while patient was under my care..  Disposition: ADMIT: I believe the patient requires admission for further care and management. The patient was admitted to hospitalists. Please see inpatient provider note for additional treatment plan details.    FREDRIK CANDIE Later, MD Emergency Medicine    Later Jerilynn RAMAN, MD 10/25/23 [phone]

## 2023-10-25 NOTE — Discharge Instructions (Addendum)
 Advised to follow-up with primary care physician in 1 week. Advised to take Augmentin  875 twice a day for 7 days for cellulitis. Advised to follow-up with methadone  clinic as scheduled. Patient is being discharged to [facility] substance abuse program tomorrow

## 2023-10-25 NOTE — ED Notes (Signed)
 Pt returned to bed.  Per her mother she had gone out to the parking lot to smoke.  Pt is A&O x 4 at this time.  EDP made aware.

## 2023-10-25 NOTE — ED Notes (Signed)
 PT found to be missing from bed.  Staff in [facility] state that she called her mother who came to pick her up b/c pt said she was being DC.  This was not the case.  Pt still has IV in as far as I know.  Attempted to call Mom but went straight to voicemail.  Pt DC as elopement.

## 2023-10-25 NOTE — Progress Notes (Addendum)
 ED Pharmacy Antibiotic Sign Off An antibiotic consult was received from an ED provider for vancomycin  per pharmacy dosing for cellulitis. A chart review was completed to assess appropriateness.   The following one time order(s) were placed:   -Vancomycin  1500mg  IV x1  Further antibiotic and/or antibiotic pharmacy consults should be ordered by the admitting provider if indicated.   Thank you for allowing pharmacy to be a part of this patient's care.   Lynwood Poplar, [facility]  Clinical Pharmacist 10/25/23 5:49 AM

## 2023-10-25 NOTE — H&P (Signed)
 History and Physical    Patient: Anna Dennis FMW:[id number] DOB: [birth year] DOA: 10/24/2023 DOS: the patient was seen and examined on 10/25/2023 PCP: Oley Bascom RAMAN, NP  Patient coming from: Home  Chief Complaint:  Chief Complaint  Patient presents with   Joint Swelling   HPI: Anna Dennis is a 33 y.o. 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  History is somewhat difficult to obtain as the patient is very lethargic.  She has had pain with swelling and redness of her right wrist for the past three days.  Swelling goes into her right hand and extends up her forearm.  Denied having any drainage.  No associated fever.   Any kind of movement of her hand seems to worsen pain.  She was seen at an urgent care yesterday and sent to the hospital for need of IV antibiotics.  She has a history of recent IV drug use, with the last use occurring over the past weekend in the affected wrist.  It was noted that the patient's mother is in the process of trying to get her sent to a treatment facility in [facility] once medically stable for discharge.  In the emergency department patient was noted to be afebrile with noted mild tachycardia, but otherwise vital signs relatively maintained.  Labs significant for WBC 11.1, BUN 18, sodium 134, BUN 18, creatinine 1.34, glucose 341, alkaline phosphatase 152, and ALT 58.  X-rays of the right wrist showed soft tissue swelling but no acute bony abnormality.  CT scan of the right wrist noted a generalized dorsal subcutaneous edema in the wrist and hand suspicious for cellulitis with no evidence of osteomyelitis or septic arthritis but limited due to movement.  Of note it was reported that patient left the emergency department with IV in hand and went to the car to smoke prior to coming back  in.  Patient had been given oxycodone  and antibiotics of Zosyn  and vancomycin  prior to blood cultures being obtained.  Review of Systems: unable to review all systems due to the inability of the patient to answer questions. Past Medical History:  Diagnosis Date   Depression    Diabetes mellitus    Insulin  pump   GERD (gastroesophageal reflux disease)    Hepatitis C    Heroin abuse (HCC)    Iron deficiency anemia    IVDU (intravenous drug user)    OCD (obsessive compulsive disorder)    Past Surgical History:  Procedure Laterality Date   I & D EXTREMITY Left 07/11/2020   Procedure: IRRIGATION AND DEBRIDEMENT EXTREMITY;  Surgeon: Elsa Lonni SAUNDERS, MD;  Location: [facility] OR;  Service: Orthopedics;  Laterality: Left;   I & D EXTREMITY Left 08/27/2020   Procedure: IRRIGATION AND DEBRIDEMENT HAND;  Surgeon: Elisabeth Craig RAMAN, MD;  Location: [facility] OR;  Service: Plastics;  Laterality: Left;   SKIN SPLIT GRAFT Left 04/16/2021   Procedure: SKIN GRAFT SPLIT THICKNESS FROM LEFT THIGH TO LEFT UPPER ARM;  Surgeon: Elisabeth Craig RAMAN, MD;  Location: [facility] OR;  Service: Plastics;  Laterality: Left;   Social History:  reports that she has been smoking cigarettes. She has never used smokeless tobacco. She reports current alcohol use. She reports that she does not currently use drugs.  No Known Allergies  Family History  Problem Relation Age of Onset   Bipolar disorder Mother  Schizophrenia Sister     Prior to Admission medications   Medication Sig Start Date End Date Taking? Authorizing Provider  amoxicillin -clavulanate (AUGMENTIN ) 875-125 MG tablet Take 1 tablet by mouth every 12 (twelve) hours. 03/16/23   Johnie Flaming A, NP  glucose blood (GLUCOSE METER TEST) test strip Use as instructed 07/14/20   Lue Elsie BROCKS, MD  Insulin  Glargine (BASAGLAR  [facility]) 100 UNIT/ML Inject 50 Units into the skin at bedtime. 08/11/23 09/10/23  Oley Bascom RAMAN, NP  insulin  lispro (HUMALOG  KWIKPEN) 100 UNIT/ML KwikPen  Inject 1-12 units into the skin three times daily before meals per sliding scale 08/11/23   Oley Bascom RAMAN, NP  Insulin  Pen Needle (MM PEN NEEDLES) 32G X 4 MM MISC To be used with insulin  as instructed 01/11/22   Ellouise Fine K, DO  insulin  regular (NOVOLIN R) 100 units/mL injection Inject 50 Units into the skin at bedtime.    [provider]  insulin  aspart (NOVOLOG ) 100 UNIT/ML injection Inject 6 Units into the skin 3 (three) times daily with meals. 02/22/12 02/28/12  Abrol, Nayana, MD  Omeprazole  20 MG TBEC Take 1 tablet (20 mg total) by mouth daily. 02/28/12 03/04/12  Vicci Afton CROME, MD    Physical Exam: Vitals:   10/24/23 2239 10/25/23 0221 10/25/23 0513 10/25/23 0846  BP: (!) 144/89 132/83 (!) 141/92 127/85  Pulse: 89 99 (!) 104 98  Resp: 12 (!) 21 15 14   Temp: 98.6 F (37 C) 98.9 F (37.2 C) 98.8 F (37.1 C) 98.6 F (37 C)  TempSrc:   Oral Oral  SpO2: 97% 98% 97% 100%  Weight:      Height:        Constitutional: Female who is lethargic and quickly falls asleep during questioning Eyes: PERRL, lids and conjunctivae normal ENMT: Mucous membranes are moist.  Normal dentition.  Neck: normal, supple, no masses, no thyromegaly Respiratory: clear to auscultation bilaterally, no wheezing, no crackles. Normal respiratory effort. No accessory muscle use.  Cardiovascular: Regular rate and rhythm, no murmurs / rubs / gallops.  2+ pedal pulses. No carotid bruits.  Abdomen: no tenderness, no masses palpated.   Bowel sounds positive.  Musculoskeletal: no clubbing / cyanosis.  Swelling present of the right wrist. Skin: Track marks present with erythema of the right wrist.  Neurologic: CN 2-12 grossly intact.   Strength 5/5 in all 4.  Psychiatric:   Lethargic, oriented to person place.   Data Reviewed:  Reviewed labs, imaging, and pertinent records as documented.  Assessment and Plan:  Cellulitis of the right wrist /hand Acute.  Patient presents with at least 3-day  history of pain, swelling, and erythema of the right wrist after IV drug use.  Initial x-rays revealed soft tissue swelling of the right wrist without bony abnormality.  Patient had follow-up CT scan of the hand with contrast which noted generalized dorsal subcutaneous edema suspicious with cellulitis without evidence of foreign body, soft tissue emphysema, osteomyelitis, or septic arthritis.  Patient had been started on empiric antibiotics of vancomycin  and Zosyn  prior to blood cultures being obtained. - Admitted to a medical telemetry bed - Follow-up blood cultures - Add-on ESR and CRP - Continue empiric antibiotics of vancomycin  and Zosyn .  De-escalate when deemed medically appropriate  Leukocytosis Acute.  Labs noted WBC elevated 11.1.  Secondary to above.  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  Last available  hemoglobin A1c noted to be 10.8 back in 04/2023. - Hypoglycemic protocols - Semglee  reduced to 35 units nightly - CBGs before every meal with moderate SSI - Adjust insulin  regimen as deemed medically appropriate  Acute kidney injury Creatinine noted to be elevated up to 1.34 with BUN 18.  Baseline creatinine previously noted to be around 0.75-0.96. - Check urinalysis - Normal saline IV fluids at 75 mL/h  IV drug abuse Polysubstance abuse Patient reporting last use of IV drugs over the weekend.  Patient's mother reported that she has plans to arrange for patient to go to a treatment facility in [facility] - Safety precautions in place patient should not leave floor with IV in place - Continue methadone  - Transitions of care consulted - Continue to counsel need of cessation of illicit drug use  DVT prophylaxis: Lovenox  Advance Care Planning:   Code Status: Full Code    Consults: None  Family Communication: None Severity of Illness: The  appropriate patient status for this patient is INPATIENT. Inpatient status is judged to be reasonable and necessary in order to provide the required intensity of service to ensure the patient's safety. The patient's presenting symptoms, physical exam findings, and initial radiographic and laboratory data in the context of their chronic comorbidities is felt to place them at high risk for further clinical deterioration. Furthermore, it is not anticipated that the patient will be medically stable for discharge from the hospital within 2 midnights of admission.   * I certify that at the point of admission it is my clinical judgment that the patient will require inpatient hospital care spanning beyond 2 midnights from the point of admission due to high intensity of service, high risk for further deterioration and high frequency of surveillance required.*  Author: Maximino DELENA Sharps, MD 10/25/2023 11:11 AM  For on call review [email or web address].

## 2023-10-26 DIAGNOSIS — L03113 Cellulitis of right upper limb: Secondary | ICD-10-CM | POA: Diagnosis not present

## 2023-10-26 LAB — CBC
HCT: 35.4 % — ABNORMAL LOW (ref 36.0–46.0)
Hemoglobin: 11.6 g/dL — ABNORMAL LOW (ref 12.0–15.0)
MCH: 29.7 pg (ref 26.0–34.0)
MCHC: 32.8 g/dL (ref 30.0–36.0)
MCV: 90.8 fL (ref 80.0–100.0)
Platelets: 249 K/uL (ref 150–400)
RBC: 3.9 MIL/uL (ref 3.87–5.11)
RDW: 13.6 % (ref 11.5–15.5)
WBC: 6.8 K/uL (ref 4.0–10.5)
nRBC: 0 % (ref 0.0–0.2)

## 2023-10-26 LAB — GLUCOSE, CAPILLARY
Glucose-Capillary: 167 mg/dL — ABNORMAL HIGH (ref 70–99)
Glucose-Capillary: 149 mg/dL — ABNORMAL HIGH (ref 70–99)
Glucose-Capillary: 78 mg/dL (ref 70–99)
Glucose-Capillary: 320 mg/dL — ABNORMAL HIGH (ref 70–99)

## 2023-10-26 LAB — BASIC METABOLIC PANEL WITH GFR
Anion gap: 7 (ref 5–15)
BUN: 15 mg/dL (ref 6–20)
CO2: 27 mmol/L (ref 22–32)
Calcium: 8 mg/dL — ABNORMAL LOW (ref 8.9–10.3)
Chloride: 102 mmol/L (ref 98–111)
Creatinine, Ser: 0.71 mg/dL (ref 0.44–1.00)
GFR, Estimated: >60 mL/min (ref >60)
Glucose, Bld: 128 mg/dL — ABNORMAL HIGH (ref 70–99)
Potassium: 3.8 mmol/L (ref 3.5–5.1)
Sodium: 136 mmol/L (ref 135–145)

## 2023-10-26 MED ORDER — VANCOMYCIN HCL IN DEXTROSE 1-5 GM/200ML-% IV SOLN
1000.0000 mg | Freq: Two times a day (BID) | INTRAVENOUS | Status: DC
Start: 2023-10-26 — End: 2023-10-28
  Administered 2023-10-26 – 2023-10-27 (×3): 1000 mg via INTRAVENOUS
  Filled 2023-10-26 (×3): qty 200

## 2023-10-26 MED ORDER — LABETALOL HCL 5 MG/ML IV SOLN
20.0000 mg | Freq: Once | INTRAVENOUS | Status: AC
Start: 2023-10-26 — End: 2023-10-26
  Administered 2023-10-26: 20 mg via INTRAVENOUS
  Filled 2023-10-26: qty 4

## 2023-10-26 MED ORDER — METHADONE HCL 5 MG PO TABS
135.0000 mg | ORAL_TABLET | Freq: Every day | ORAL | Status: DC
  Administered 2023-10-26 – 2023-10-28 (×3): 135 mg via ORAL
  Filled 2023-10-26 (×3): qty 1

## 2023-10-26 NOTE — Progress Notes (Signed)
 Pharmacy Antibiotic Note  Anna Dennis is a 33 y.o. female for which pharmacy has been consulted for vancomycin  dosing for cellulitis.  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 Patient presenting with swelling of rt wrist.  SCr improved close to baseline today at 0.71 WBC 6.8, afebrile.   Plan: Zosyn  per MD Adjust vancomycin  to 1g q12h given improve renal markers >> eAUC 459.8, Cmax 30.5, Cmin 12, Scr 0.71 Monitor WBC, fever, renal function, cultures De-escalate when able Levels at steady state  Height: 5' 7 (170.2 cm) Weight: 71.6 kg (157 lb 13.6 oz) IBW/kg (Calculated) : 61.6  Temp (24hrs), Avg:98.5 F (36.9 C), Min:98.1 F (36.7 C), Max:98.9 F (37.2 C)  Recent Labs  Lab 10/24/23 1941 10/26/23 0512  WBC 11.1* 6.8  CREATININE 1.34* 0.71    Estimated Creatinine Clearance: 96.4 mL/min (by C-G formula based on SCr of 0.71 mg/dL).    No Known Allergies  Microbiology results: 9/27 Bcx = NGTD  Thank you for allowing pharmacy to be a part of this patient's care.  Feliciano Close, PharmD PGY2 Infectious Diseases Pharmacy Resident  10/26/2023 1:47 PM

## 2023-10-26 NOTE — Progress Notes (Addendum)
 One Liquid bottle of Methadone  135 mg from [facility] in [place] with Patient's name, dispense date on bottle 10/25/23 retrieved from patient.  Patient states she brought it in, and reports taking 135 mg.  Pt. states this is the dose she is currently taking, and she brought it in to verify we would be giving the correct dosage, because she was not sure if we carried this dosage.  Medication reviewed with pt. and medication turned into pharmacy for storage per pharmacy directions.  Pt. was unable to sign verifying that we had taken methadone  due to her right hand/wrist being swollen from cellulitis.  Medication was sealed in security bag in the presence of the pt. and charge nurse Mahima,RN.  Medication taken to pharmacy, and copy of paperwork placed in patient's chart.    Pt. continues to be drowsy, urine sample obtained, however no urine tox. screen ordered at this time.

## 2023-10-26 NOTE — Plan of Care (Signed)

## 2023-10-26 NOTE — Progress Notes (Signed)
 PROGRESS NOTE    Anna Dennis  FMW:[id number] DOB: [birth year] DOA: 10/24/2023 PCP: Oley Bascom RAMAN, NP   Brief Narrative:  This 33 y.o. 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  Patient has pain swelling and redness of right wrist for last 3 days.  She was seen at urgent care yesterday and was sent to the hospital for need for IV antibiotics. Patient has history of recent IV drug use, last use occurring over the past weekend in the affected wrist.  Patient's mother is in the process of trying to get her sent to a treatment facility in [facility] once medically stable for discharge.  She has leukocytosis, mild elevation of serum creatinine elevated liver enzymes.  X-ray of the right wrist showed soft tissue swelling but no acute bony abnormality. CT of the right wrist shows findings suspicious for cellulitis with no evidence of osteomyelitis or septic arthritis.  Patient was admitted for further evaluation and was  started on empiric antibiotics Zosyn  and vancomycin .  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   Initial x-rays revealed soft tissue swelling of the right wrist without bony abnormality.   CT Hand showed findings suspicious with cellulitis without evidence of foreign body, soft tissue emphysema, osteomyelitis, or septic arthritis.   Started on empiric antibiotics ( vancomycin  and Zosyn )  Follow-up blood  cultures De escalate antibiotics when deemed medically appropriate.   Leukocytosis: Secondary to above. He was mildly tachycardic Continue Empiric antibiotics.   Uncontrolled diabetes mellitus type 1 with hyperglycemia: Blood glucose noted 341.Last HbA1c noted to be 10.8 back in 04/2023. Hypoglycemic protocols Semglee  reduced to 35 units nightly CBGs before every meal with moderate SSI Adjust insulin  regimen as deemed medically appropriate   Acute kidney injury: Creatinine noted to be elevated up to 1.34 with BUN 18.  Baseline creatinine previously noted to be around 0.75-0.96. Renal function back to normal with IV hydration.   IV drug abuse: Polysubstance abuse: Patient reporting last use of IV drugs over the weekend.   Patient's mother reported that she has plans to arrange for patient to go to a treatment facility in [facility] Safety precautions in place patient should not leave floor with IV in place Continue methadone  Transitions of care consulted Continue to counsel need of cessation of illicit drug use   DVT prophylaxis: Lovenox  Code Status: Full code Family Communication: No family at bed side. Disposition Plan:    Status is: Inpatient Remains inpatient appropriate because: Severity of illness.    Consultants:  None  Procedures: CT hand  Antimicrobials:  Anti-infectives (From admission, onward)    Start     Dose/Rate Route Frequency Ordered Stop   10/26/23 0900  Vancomycin  (VANCOCIN ) 1,250 mg in sodium chloride  0.9 % 250 mL IVPB        1,250 mg 166.7 mL/hr over 90 Minutes Intravenous Every 24 hours 10/25/23 1300     10/25/23 1400  piperacillin -tazobactam (  ZOSYN ) IVPB 3.375 g  Status:  Discontinued        3.375 g 100 mL/hr over 30 Minutes Intravenous Every 8 hours 10/25/23 1135 10/25/23 1140   10/25/23 1300  piperacillin -tazobactam (ZOSYN ) IVPB 3.375 g        3.375 g 12.5 mL/hr over 240 Minutes Intravenous Every 8 hours 10/25/23 1140     10/25/23  0600  piperacillin -tazobactam (ZOSYN ) IVPB 3.375 g        3.375 g 100 mL/hr over 30 Minutes Intravenous  Once 10/25/23 0546 10/25/23 0656   10/25/23 0600  Vancomycin  (VANCOCIN ) 1,500 mg in sodium chloride  0.9 % 500 mL IVPB        1,500 mg 250 mL/hr over 120 Minutes Intravenous  Once 10/25/23 0547 10/25/23 1211      Subjective: Patient was seen and examined at bedside.  Overnight events noted. She continues to have significantly swollen right hand with erythema and tenderness.  Objective: Vitals:   10/25/23 1213 10/25/23 1319 10/26/23 0400 10/26/23 0817  BP: 120/73 (!) 129/95 (!) 186/114 (!) 131/92  Pulse: 78 82 79 87  Resp: 16 18 20 16   Temp: 98.1 F (36.7 C) 97.8 F (36.6 C) 98.1 F (36.7 C) 98.9 F (37.2 C)  TempSrc: Oral  Oral   SpO2: 98% 99% 97% 99%  Weight:      Height:        Intake/Output Summary (Last 24 hours) at 10/26/2023 1329 Last data filed at 10/25/2023 1603 Gross per 24 hour  Intake 240 ml  Output --  Net 240 ml   Filed Weights   10/24/23 1923  Weight: 71.6 kg    Examination:  General exam: Appears calm and comfortable, not in any acute distress.  Respiratory system: Clear to auscultation. Respiratory effort normal.  RR1 4 Cardiovascular system: S1 & S2 heard, RRR. No JVD, murmurs, rubs, gallops or clicks.  Gastrointestinal system: Abdomen is non distended, soft and non tender.  Normal bowel sounds heard. Central nervous system: Alert and oriented x 3. No focal neurological deficits. Extremities: Right hand swollen, erythematous, warm, tender Skin: No rashes, lesions or ulcers Psychiatry: Judgement and insight appear normal. Mood & affect appropriate.     Data Reviewed: I have personally reviewed following labs and imaging studies  CBC: Recent Labs  Lab 10/24/23 1941 10/26/23 0512  WBC 11.1* 6.8  NEUTROABS 8.5*  --   HGB 12.1 11.6*  HCT 37.9 35.4*  MCV 92.2 90.8  PLT 308 249   Basic Metabolic Panel: Recent Labs  Lab 10/24/23 1941  10/26/23 0512  NA 134* 136  K 4.1 3.8  CL 98 102  CO2 22 27  GLUCOSE 341* 128*  BUN 18 15  CREATININE 1.34* 0.71  CALCIUM 8.6* 8.0*   GFR: Estimated Creatinine Clearance: 96.4 mL/min (by C-G formula based on SCr of 0.71 mg/dL). Liver Function Tests: Recent Labs  Lab 10/24/23 1941  AST 39  ALT 58*  ALKPHOS 152*  BILITOT 0.6  PROT 6.8  ALBUMIN 3.2*   No results for input(s): LIPASE, AMYLASE in the last 168 hours. No results for input(s): AMMONIA in the last 168 hours. Coagulation Profile: No results for input(s): INR, PROTIME in the last 168 hours. Cardiac Enzymes: No results for input(s): CKTOTAL, CKMB, CKMBINDEX, TROPONINI in the last 168 hours. BNP (last 3 results) No results for input(s): PROBNP in the last 8760 hours. HbA1C: Recent Labs    10/25/23 1535  HGBA1C 11.0*   CBG: Recent Labs  Lab 10/25/23 1318  10/25/23 1704 10/25/23 2202 10/26/23 0636 10/26/23 1144  GLUCAP 231* 312* 363* 167* 149*   Lipid Profile: No results for input(s): CHOL, HDL, LDLCALC, TRIG, CHOLHDL, LDLDIRECT in the last 72 hours. Thyroid Function Tests: No results for input(s): TSH, T4TOTAL, FREET4, T3FREE, THYROIDAB in the last 72 hours. Anemia Panel: No results for input(s): VITAMINB12, FOLATE, FERRITIN, TIBC, IRON, RETICCTPCT in the last 72 hours. Sepsis Labs: No results for input(s): PROCALCITON, LATICACIDVEN in the last 168 hours.  Recent Results (from the past 240 hours)  Blood culture (routine x 2)     Status: None (Preliminary result)   Collection Time: 10/25/23  3:35 PM   Specimen: BLOOD RIGHT ARM  Result Value Ref Range Status   Specimen Description BLOOD RIGHT ARM  Final   Special Requests   Final    BOTTLES DRAWN AEROBIC AND ANAEROBIC Blood Culture adequate volume   Culture   Final    NO GROWTH < 24 HOURS Performed at [facility], [address] [place], [place] [address]    Report Status PENDING   Incomplete  Blood culture (routine x 2)     Status: None (Preliminary result)   Collection Time: 10/25/23  3:50 PM   Specimen: BLOOD RIGHT HAND  Result Value Ref Range Status   Specimen Description BLOOD RIGHT HAND  Final   Special Requests   Final    BOTTLES DRAWN AEROBIC AND ANAEROBIC Blood Culture adequate volume   Culture   Final    NO GROWTH < 24 HOURS Performed at [facility], [address] [place], [place] [address]    Report Status PENDING  Incomplete    Radiology Studies: CT HAND RIGHT W CONTRAST Result Date: 10/25/2023 CLINICAL DATA:  Osteomyelitis suspected, hand, xray done Right arm pain and swelling after intravenous drug use. EXAM: CT OF THE UPPER RIGHT EXTREMITY WITH CONTRAST TECHNIQUE: Multidetector CT imaging of the right hand and wrist was performed according to the standard protocol following intravenous contrast administration. RADIATION DOSE REDUCTION: This exam was performed according to the departmental dose-optimization program which includes automated exposure control, adjustment of the mA and/or kV according to patient size and/or use of iterative reconstruction technique. CONTRAST:  75mL OMNIPAQUE  IOHEXOL  350 MG/ML SOLN COMPARISON:  Radiographs 10/24/2023 and 01/10/2022 FINDINGS: Technical note: Despite efforts by the technologist and patient, moderate motion artifact is present on today's exam and could not be eliminated. This reduces exam sensitivity and specificity. Some images were repeated. Bones/Joint/Cartilage Allowing for the artifact and as correlated with recent radiographs, there is no evidence of acute fracture, dislocation or osteomyelitis. The motion artifact is quite severe on the images through the metacarpals and distal forearm. The joint spaces appear preserved. No large joint effusions are identified. Ligaments Suboptimally assessed by CT. Muscles and Tendons Generalized dorsal subcutaneous edema. Difficult to exclude extensor tenosynovitis on  this motion-degraded, noncontrast study. No intramuscular fluid collections are identified. Soft tissues Generalized dorsal subcutaneous edema in the wrist and hand. No evidence of foreign body or soft tissue emphysema. No focal fluid collection identified on this motion-degraded, noncontrast study. IMPRESSION: 1. Generalized dorsal subcutaneous edema in the wrist and hand, suspicious for cellulitis. No evidence of foreign body or soft tissue emphysema. 2. No evidence of osteomyelitis or septic arthritis. 3. Technically limited examination due to motion artifact and lack of intravenous contrast. No organized fluid collections identified. Electronically Signed   By: Elsie Perone M.D.   On: 10/25/2023 09:14   DG Wrist Complete Right  Result Date: 10/24/2023 CLINICAL DATA:  Swelling to the right arm after cocaine  use. Soft tissue redness and warmth. EXAM: RIGHT WRIST - COMPLETE 3+ VIEW COMPARISON:  01/10/2022 FINDINGS: Soft tissue swelling about the right wrist most prominent along the volar aspect and along the dorsal aspect of the metacarpal region. No radiopaque soft tissue foreign bodies or soft tissue gas. Bones appear intact. No focal bone lesion or bone destruction. Joint spaces are normal. No acute fracture or dislocation. IMPRESSION: Soft tissue swelling in the right wrist. No acute bony abnormalities. The Electronically Signed   By: Elsie Gravely M.D.   On: 10/24/2023 19:59   Scheduled Meds:  enoxaparin  (LOVENOX ) injection  40 mg Subcutaneous Q24H   insulin  aspart  0-15 Units Subcutaneous TID WC   insulin  aspart  0-5 Units Subcutaneous QHS   insulin  glargine  35 Units Subcutaneous Q2200   methadone   135 mg Oral Q1200   sodium chloride  flush  3 mL Intravenous Q12H   Continuous Infusions:  sodium chloride  75 mL/hr at 10/25/23 2251   piperacillin -tazobactam 3.375 g (10/26/23 1259)   vancomycin  1,250 mg (10/26/23 0950)     LOS: 1 day    Time spent: 50 mins    Darcel Dawley,  MD [facility]   If 7PM-7AM, please contact night-coverage

## 2023-10-27 ENCOUNTER — Telehealth (HOSPITAL_COMMUNITY): Payer: Self-pay | Admitting: Pharmacy Technician

## 2023-10-27 ENCOUNTER — Other Ambulatory Visit (HOSPITAL_COMMUNITY): Payer: Self-pay

## 2023-10-27 DIAGNOSIS — L03113 Cellulitis of right upper limb: Secondary | ICD-10-CM | POA: Diagnosis not present

## 2023-10-27 LAB — GLUCOSE, CAPILLARY
Glucose-Capillary: 302 mg/dL — ABNORMAL HIGH (ref 70–99)
Glucose-Capillary: 154 mg/dL — ABNORMAL HIGH (ref 70–99)
Glucose-Capillary: 163 mg/dL — ABNORMAL HIGH (ref 70–99)
Glucose-Capillary: 49 mg/dL — ABNORMAL LOW (ref 70–99)
Glucose-Capillary: 104 mg/dL — ABNORMAL HIGH (ref 70–99)

## 2023-10-27 MED ORDER — INSULIN ASPART 100 UNIT/ML IJ SOLN
3.0000 [IU] | Freq: Three times a day (TID) | INTRAMUSCULAR | Status: DC
  Administered 2023-10-27 – 2023-10-29 (×6): 3 [IU] via SUBCUTANEOUS

## 2023-10-27 MED ORDER — OXYCODONE HCL 5 MG PO TABS
5.0000 mg | ORAL_TABLET | Freq: Four times a day (QID) | ORAL | Status: DC | PRN
Start: 2023-10-27 — End: 2023-10-29
  Administered 2023-10-27 – 2023-10-29 (×5): 5 mg via ORAL
  Filled 2023-10-27 (×5): qty 1

## 2023-10-27 MED ORDER — HYDRALAZINE HCL 25 MG PO TABS
25.0000 mg | ORAL_TABLET | Freq: Three times a day (TID) | ORAL | Status: DC
  Administered 2023-10-27 – 2023-10-28 (×5): 25 mg via ORAL
  Filled 2023-10-27 (×6): qty 1

## 2023-10-27 MED ORDER — NICOTINE 14 MG/24HR TD PT24
14.0000 mg | MEDICATED_PATCH | Freq: Every day | TRANSDERMAL | Status: DC
  Administered 2023-10-27 – 2023-10-28 (×2): 14 mg via TRANSDERMAL
  Filled 2023-10-27 (×3): qty 1

## 2023-10-27 NOTE — Inpatient Diabetes Management (Signed)
 Inpatient Diabetes Program Recommendations  AACE/ADA: New Consensus Statement on Inpatient Glycemic Control (2015)  Target Ranges:  Prepandial:   less than 140 mg/dL      Peak postprandial:   less than 180 mg/dL (1-2 hours)      Critically ill patients:  140 - 180 mg/dL   Lab Results  Component Value Date   GLUCAP 302 (H) 10/27/2023   HGBA1C 11.0 (H) 10/25/2023    Review of Glycemic Control  Latest Reference Range & Units 10/26/23 06:36 10/26/23 11:44 10/26/23 16:58 10/26/23 21:26 10/27/23 06:37  Glucose-Capillary 70 - 99 mg/dL 832 (H) 850 (H) 78 679 (H) 302 (H)  (H): Data is abnormally high   Diabetes history: DM1(does not make insulin .  Needs correction, basal and meal coverage)  Outpatient Diabetes medications:  Basaglar  40 units every day Humalog  4-30 units TID  Current orders for Inpatient glycemic control:  Lantus  35 units every day Novolog  0-15 units TID and 0-5 units QHS  Inpatient Diabetes Program Recommendations:    Novolog  3 units TID with meals if she consumes at least 50%.  Met with patient and RN at bedside. She is groggy.  CBG was 302 mg/dL this am at 93:62.  RN states it was reported to her that patient has been snacking overnight.  She has T1DM and does not make insulin , therefore requires insulin  with the intake of carbohydrates.    She confirms above home medications.  Missed her last PCP appt. And is currently out of refills.  She does not use an insulin  carb ratio.  She checks her CBG's with a glucometer.  Has never used a CGM before but is interested.  She has an Aeronautical engineer phone.  Will check compatibility when she is more alert.  Will ask or TOC pharmacy for a benefit check on the CGM.    Reviewed patient's current A1c of 11%. Explained what a A1c is and what it measures. Also reviewed goal A1c with patient, importance of good glucose control @ home, and blood sugar goals. Will continue to follow.    Thank you, Wyvonna Pinal, MSN, CDCES Diabetes  Coordinator Inpatient Diabetes Program [phone] (team pager from 8a-5p)

## 2023-10-27 NOTE — Progress Notes (Signed)
 PROGRESS NOTE    Anna Dennis  FMW:[id number] DOB: 20-Sep-1990 DOA: 10/24/2023 PCP: Oley Bascom RAMAN, NP   Brief Narrative:  This 33 y.o. 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  Patient has pain swelling and redness of right wrist for last 3 days.  She was seen at urgent care yesterday and was sent to the hospital for need for IV antibiotics. Patient has history of recent IV drug use, last use occurring over the past weekend in the affected wrist.  Patient's mother is in the process of trying to get her sent to a treatment facility in [facility] once medically stable for discharge.  She has leukocytosis, mild elevation of serum creatinine elevated liver enzymes.  X-ray of the right wrist showed soft tissue swelling but no acute bony abnormality. CT of the right wrist shows findings suspicious for cellulitis with no evidence of osteomyelitis or septic arthritis.  Patient was admitted for further evaluation and was  started on empiric antibiotics Zosyn  and vancomycin .  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   Initial x-rays revealed soft tissue swelling of the right wrist without bony abnormality.   CT Hand showed findings suspicious with cellulitis without evidence of foreign body, soft tissue emphysema, osteomyelitis, or septic arthritis.   Started on empiric antibiotics ( vancomycin  and Zosyn )  Blood cultures >  No  growth so far. De escalate antibiotics when deemed medically appropriate.   Leukocytosis: > Resolved. Secondary to above. He was mildly tachycardic Continue Empiric antibiotics.   Uncontrolled diabetes mellitus type 1 with hyperglycemia: Blood glucose noted 341.Last HbA1c noted to be 10.8 back in 04/2023. Hypoglycemic protocols Continue Semglee  35 units nightly CBGs before every meal with moderate SSI. Adjust insulin  regimen as deemed medically appropriate.   Acute kidney injury: Creatinine noted to be elevated up to 1.34 with BUN 18.   Baseline creatinine previously noted to be around 0.75-0.96. Renal function back to normal with IV hydration.   IV drug abuse: Polysubstance abuse: Patient reporting last use of IV drugs over the weekend.   Patient's mother reported that she has plans to arrange for patient to go to a treatment facility in [facility]. Safety precautions in place patient should not leave floor with IV in place Continue methadone  Transitions of care consulted Continue to counsel need of cessation of illicit drug use   DVT prophylaxis: Lovenox  Code Status: Full code Family Communication: No family at bed side. Disposition Plan:    Status is: Inpatient Remains inpatient appropriate because: Severity of illness. Plan: Discharge to rehab facility in [facility] 1 to 2 days.    Consultants:  None  Procedures: CT hand  Antimicrobials:  Anti-infectives (From admission, onward)    Start     Dose/Rate Route Frequency Ordered Stop   10/26/23 2200  vancomycin  (VANCOCIN ) IVPB 1000 mg/200 mL premix        1,000 mg 200 mL/hr over 60 Minutes  Intravenous Every 12 hours 10/26/23 1348     10/26/23 0900  Vancomycin  (VANCOCIN ) 1,250 mg in sodium chloride  0.9 % 250 mL IVPB  Status:  Discontinued        1,250 mg 166.7 mL/hr over 90 Minutes Intravenous Every 24 hours 10/25/23 1300 10/26/23 1348   10/25/23 1400  piperacillin -tazobactam (ZOSYN ) IVPB 3.375 g   Status:  Discontinued        3.375 g 100 mL/hr over 30 Minutes Intravenous Every 8 hours 10/25/23 1135 10/25/23 1140   10/25/23 1300  piperacillin -tazobactam (ZOSYN ) IVPB 3.375 g        3.375 g 12.5 mL/hr over 240 Minutes Intravenous Every 8 hours 10/25/23 1140     10/25/23 0600  piperacillin -tazobactam (ZOSYN ) IVPB 3.375 g        3.375 g 100 mL/hr over 30 Minutes Intravenous  Once 10/25/23 0546 10/25/23 0656   10/25/23 0600  Vancomycin  (VANCOCIN ) 1,500 mg in sodium chloride  0.9 % 500 mL IVPB        1,500 mg 250 mL/hr over 120 Minutes Intravenous  Once 10/25/23 0547 10/25/23 1211      Subjective: Patient was seen and examined at bedside.  Overnight events noted. She still have significantly swollen right hand with erythema and tenderness.  Objective: Vitals:   10/26/23 0817 10/26/23 2200 10/27/23 0449 10/27/23 0900  BP: (!) 131/92 124/82 139/87 (!) 148/104  Pulse: 87 82 88 77  Resp: 16 18 15 16   Temp: 98.9 F (37.2 C) 98.6 F (37 C)  98 F (36.7 C)  TempSrc:  Oral  Oral  SpO2: 99% 99% 97% 99%  Weight:      Height:        Intake/Output Summary (Last 24 hours) at 10/27/2023 1326 Last data filed at 10/26/2023 2223 Gross per 24 hour  Intake 360 ml  Output --  Net 360 ml   Filed Weights   10/24/23 1923  Weight: 71.6 kg    Examination:  General exam: Appears calm and comfortable, not in any acute distress.  Respiratory system: CTA Bilaterally. Respiratory effort normal.  RR 13 Cardiovascular system: S1 & S2 heard, RRR. No JVD, murmurs, rubs, gallops or clicks.  Gastrointestinal system: Abdomen is non distended, soft and non tender.  Normal bowel sounds heard. Central nervous system: Alert and oriented x 3. No focal neurological deficits. Extremities: Right hand swollen, erythematous, warm, tender Skin: No rashes, lesions or ulcers Psychiatry: Judgement and insight appear normal. Mood & affect appropriate.     Data Reviewed: I have personally reviewed following labs  and imaging studies  CBC: Recent Labs  Lab 10/24/23 1941 10/26/23 0512  WBC 11.1* 6.8  NEUTROABS 8.5*  --   HGB 12.1 11.6*  HCT 37.9 35.4*  MCV 92.2 90.8  PLT 308 249   Basic Metabolic Panel: Recent Labs  Lab 10/24/23 1941 10/26/23 0512  NA 134* 136  K 4.1 3.8  CL 98 102  CO2 22 27  GLUCOSE 341* 128*  BUN 18 15  CREATININE 1.34* 0.71  CALCIUM 8.6* 8.0*   GFR: Estimated Creatinine Clearance: 96.4 mL/min (by C-G formula based on SCr of 0.71 mg/dL). Liver Function Tests: Recent Labs  Lab 10/24/23 1941  AST 39  ALT 58*  ALKPHOS 152*  BILITOT 0.6  PROT 6.8  ALBUMIN 3.2*   No results for input(s): LIPASE, AMYLASE in the last 168 hours. No results for input(s): AMMONIA in the last 168 hours. Coagulation Profile: No results for input(s): INR, PROTIME in the last  168 hours. Cardiac Enzymes: No results for input(s): CKTOTAL, CKMB, CKMBINDEX, TROPONINI in the last 168 hours. BNP (last 3 results) No results for input(s): PROBNP in the last 8760 hours. HbA1C: Recent Labs    10/25/23 1535  HGBA1C 11.0*   CBG: Recent Labs  Lab 10/26/23 1144 10/26/23 1658 10/26/23 2126 10/27/23 0637 10/27/23 1136  GLUCAP 149* 78 320* 302* 154*   Lipid Profile: No results for input(s): CHOL, HDL, LDLCALC, TRIG, CHOLHDL, LDLDIRECT in the last 72 hours. Thyroid Function Tests: No results for input(s): TSH, T4TOTAL, FREET4, T3FREE, THYROIDAB in the last 72 hours. Anemia Panel: No results for input(s): VITAMINB12, FOLATE, FERRITIN, TIBC, IRON, RETICCTPCT in the last 72 hours. Sepsis Labs: No results for input(s): PROCALCITON, LATICACIDVEN in the last 168 hours.  Recent Results (from the past 240 hours)  Blood culture (routine x 2)     Status: None (Preliminary result)   Collection Time: 10/25/23  3:35 PM   Specimen: BLOOD RIGHT ARM  Result Value Ref Range Status   Specimen Description BLOOD RIGHT ARM  Final   Special  Requests   Final    BOTTLES DRAWN AEROBIC AND ANAEROBIC Blood Culture adequate volume   Culture   Final    NO GROWTH 2 DAYS Performed at [facility], [address] [place], [place] [address]    Report Status PENDING  Incomplete  Blood culture (routine x 2)     Status: None (Preliminary result)   Collection Time: 10/25/23  3:50 PM   Specimen: BLOOD RIGHT HAND  Result Value Ref Range Status   Specimen Description BLOOD RIGHT HAND  Final   Special Requests   Final    BOTTLES DRAWN AEROBIC AND ANAEROBIC Blood Culture adequate volume   Culture   Final    NO GROWTH 2 DAYS Performed at [facility], [address] [place], [place] [address]    Report Status PENDING  Incomplete    Radiology Studies: No results found.  Scheduled Meds:  enoxaparin  (LOVENOX ) injection  40 mg Subcutaneous Q24H   insulin  aspart  0-15 Units Subcutaneous TID WC   insulin  aspart  0-5 Units Subcutaneous QHS   insulin  aspart  3 Units Subcutaneous TID WC   insulin  glargine  35 Units Subcutaneous Q2200   methadone   135 mg Oral Q1200   nicotine   14 mg Transdermal Daily   sodium chloride  flush  3 mL Intravenous Q12H   Continuous Infusions:  sodium chloride  75 mL/hr at 10/26/23 1730   piperacillin -tazobactam 3.375 g (10/27/23 0446)   vancomycin  1,000 mg (10/27/23 1029)     LOS: 2 days    Time spent: 35 mins    Darcel Dawley, MD [facility]   If 7PM-7AM, please contact night-coverage

## 2023-10-27 NOTE — Telephone Encounter (Signed)
 Pharmacy Patient Advocate Encounter   Received notification from Inpatient Request that prior authorization for [employer] is required/requested.   Insurance verification completed.   The patient is insured through [facility] MEDICAID .   Per test claim: PA required; PA submitted to above mentioned insurance via Latent Key/confirmation #/EOC [id number] Status is pending

## 2023-10-27 NOTE — Telephone Encounter (Signed)
 Patient Product/process development scientist completed.    The patient is insured through [place].     Ran test claim for Dexcom G7 Sensor and Requires Prior Authorization  Ran test claim for [employer] 3 Plus Sensor and Requires Prior Authorization  This test claim was processed through [employer]- copay amounts may vary at other pharmacies due to [employer], or as the patient moves through the different stages of their insurance plan.     Reyes Sharps, CPHT Pharmacy Technician III Certified Patient Advocate [facility] Patient Advocate Team Direct Number: [phone]  Fax: [phone]

## 2023-10-27 NOTE — Telephone Encounter (Signed)
 Pharmacy Patient Advocate Encounter  Received notification from [employer] [place] [employer] that Prior Authorization for [employer] 3 Plus Sensors  has been APPROVED from 10/27/2023 to 04/25/2024. Ran test claim, Copay is $0.00. This test claim was processed through [facility]- copay amounts may vary at other pharmacies due to pharmacy/plan contracts, or as the patient moves through the different stages of their insurance plan.   PA #/Case ID/Reference #: [id number]

## 2023-10-28 ENCOUNTER — Other Ambulatory Visit (HOSPITAL_COMMUNITY): Payer: Self-pay

## 2023-10-28 DIAGNOSIS — L03113 Cellulitis of right upper limb: Secondary | ICD-10-CM | POA: Diagnosis not present

## 2023-10-28 LAB — GLUCOSE, CAPILLARY
Glucose-Capillary: 234 mg/dL — ABNORMAL HIGH (ref 70–99)
Glucose-Capillary: 275 mg/dL — ABNORMAL HIGH (ref 70–99)
Glucose-Capillary: 181 mg/dL — ABNORMAL HIGH (ref 70–99)
Glucose-Capillary: 176 mg/dL — ABNORMAL HIGH (ref 70–99)

## 2023-10-28 MED ORDER — INSULIN ASPART 100 UNIT/ML IJ SOLN: 0.0000 [IU] | Freq: Every day | INTRAMUSCULAR | Status: DC

## 2023-10-28 MED ORDER — VANCOMYCIN HCL IN DEXTROSE 1-5 GM/200ML-% IV SOLN
1000.0000 mg | Freq: Two times a day (BID) | INTRAVENOUS | Status: DC
  Administered 2023-10-28 (×2): 1000 mg via INTRAVENOUS
  Filled 2023-10-28 (×3): qty 200

## 2023-10-28 MED ORDER — AMOXICILLIN-POT CLAVULANATE 875-125 MG PO TABS
1.0000 | ORAL_TABLET | Freq: Two times a day (BID) | ORAL | 0 refills | Status: AC
  Filled 2023-10-28 (×3): qty 14, 7d supply, fill #0

## 2023-10-28 MED ORDER — HYDRALAZINE HCL 25 MG PO TABS
25.0000 mg | ORAL_TABLET | Freq: Three times a day (TID) | ORAL | 0 refills | Status: AC
  Filled 2023-10-28 (×3): qty 90, 30d supply, fill #0

## 2023-10-28 MED ORDER — INSULIN ASPART 100 UNIT/ML IJ SOLN
0.0000 [IU] | Freq: Three times a day (TID) | INTRAMUSCULAR | Status: DC
  Administered 2023-10-28: 2 [IU] via SUBCUTANEOUS
  Administered 2023-10-28: 5 [IU] via SUBCUTANEOUS
  Administered 2023-10-29: 7 [IU] via SUBCUTANEOUS

## 2023-10-28 NOTE — Discharge Summary (Signed)
 Physician Discharge Summary  Anna Dennis FMW:[id number] DOB: 1990-10-31 DOA: 10/24/2023  PCP: Oley Bascom RAMAN, NP  Admit date: 10/24/2023  Discharge date: 10/28/2023  Admitted From: Home.  Disposition:  [facility].  Recommendations for Outpatient Follow-up:  Follow up with PCP in 1-2 weeks. Please obtain BMP/CBC in one week. Advised to take Augmentin  875 twice a day for 7 days for cellulitis. Advised to follow-up with methadone  clinic as scheduled. Patient is being discharged to [facility] substance abuse program tomorrow  Home Health:None Equipment/Devices:None  Discharge Condition: Stable CODE STATUS:Full code Diet recommendation: Carb Modified   Brief Summary/ Hospital Course: This 33 y.o. 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  Patient has pain,  swelling and redness of right wrist for last 3 days.  She was seen at urgent care yesterday and was sent to the hospital for need for IV antibiotics. Patient has history of recent IV drug use, last use occurring over the past weekend in the affected wrist.  Patient's mother is in the process of trying to get her sent to a treatment facility in [facility] once medically stable for discharge.  She has leukocytosis, mild elevation of serum creatinine,  elevated liver enzymes.  X-ray of the right wrist showed soft tissue swelling but no acute bony abnormality. CT of the right wrist shows findings suspicious for cellulitis with no evidence of osteomyelitis or septic arthritis.  Patient was admitted for further evaluation and was started on empiric antibiotics Zosyn  and vancomycin .  Patient has made significant improvement,  swelling has improved.  Blood cultures negative for 3 days.  Antibiotic changed to Augmentin  875 twice a day for 7  days.  Patient has to follow-up in methadone  clinic in the morning and then she has to go to [facility] substance abuse program tomorrow.  Discharge Diagnoses:  Principal Problem:   Cellulitis of right hand Active Problems:   Leukocytosis   Uncontrolled type 1 diabetes mellitus with hyperglycemia, with long-term current use of insulin  (HCC)   AKI (acute kidney injury)   IVDU (intravenous drug user)   Polysubstance abuse (HCC)  Cellulitis of the right wrist / Hand:  Patient presented with pain, swelling, and erythema of the right wrist after IV drug use for three days.   Initial x-rays revealed soft tissue swelling of the right wrist without bony abnormality.   CT Hand showed findings suspicious with cellulitis without evidence of foreign body, soft tissue emphysema, osteomyelitis, or septic arthritis.   Started on empiric antibiotics ( vancomycin  and Zosyn )  Blood cultures >  No growth so far. Antibiotics changed to Augmentin .  Advised arm elevation.   Leukocytosis: > Resolved. Secondary to above. He was mildly tachycardic Continue Empiric antibiotics.   Uncontrolled diabetes mellitus type 1 with hyperglycemia: Blood glucose noted 341.Last HbA1c noted to be 10.8 back in 04/2023. Hypoglycemic protocols Continue Semglee  35 units nightly CBGs before every meal with moderate SSI. Adjust insulin  regimen as deemed medically appropriate.   Acute kidney injury: Creatinine noted to be elevated up to 1.34 with BUN 18.   Baseline creatinine previously noted to be around 0.75-0.96. Renal function back to normal with IV hydration.   IV drug abuse: Polysubstance abuse: Patient reporting last use of IV drugs over the weekend.   Patient's mother reported that she has plans to arrange for patient  to go to a treatment facility in [facility]. Safety precautions in place patient should not leave floor with IV in place Continue methadone  Transitions of care consulted Continue to  counsel need of cessation of illicit drug use.  Discharge Instructions  Discharge Instructions     Call MD for:  difficulty breathing, headache or visual disturbances   Complete by: As directed    Call MD for:  persistant dizziness or light-headedness   Complete by: As directed    Call MD for:  persistant nausea and vomiting   Complete by: As directed    Diet - low sodium heart healthy   Complete by: As directed    Diet Carb Modified   Complete by: As directed    Discharge instructions   Complete by: As directed    Advised to follow-up with primary care physician in 1 week. Advised to take Augmentin  875 twice a day for 7 days for cellulitis. Advised to follow-up with methadone  clinic as scheduled. Patient is being discharged to [facility] substance abuse program tomorrow   Increase activity slowly   Complete by: As directed       Allergies as of 10/28/2023   No Known Allergies      Medication List     TAKE these medications    amoxicillin -clavulanate 875-125 MG tablet Commonly known as: AUGMENTIN  Take 1 tablet by mouth 2 (two) times daily for 7 days.   Basaglar  KwikPen 100 UNIT/ML Inject 50 Units into the skin at bedtime. What changed: how much to take   Glucose Meter Test test strip Generic drug: glucose blood Use as instructed   hydrALAZINE 25 MG tablet Commonly known as: APRESOLINE Take 1 tablet (25 mg total) by mouth 3 (three) times daily.   insulin  lispro 100 UNIT/ML KwikPen Commonly known as: HumaLOG  KwikPen Inject 1-12 units into the skin three times daily before meals per sliding scale What changed:  how much to take how to take this when to take this additional instructions   methadone  10 MG/5ML solution Commonly known as: DOLOPHINE  Take 150 mg by mouth daily at 12 noon.   TechLite Pen Needles 32G X 4 MM Misc Generic drug: Insulin  Pen Needle To be used with insulin  as instructed        Follow-up Information     Oley Bascom RAMAN,  NP Follow up in 1 week(s).   Specialties: Pulmonary Disease, Endocrinology Contact information: [address] [place] [address] [phone]                No Known Allergies  Consultations: None   Procedures/Studies: CT HAND RIGHT W CONTRAST Result Date: 10/25/2023 CLINICAL DATA:  Osteomyelitis suspected, hand, xray done Right arm pain and swelling after intravenous drug use. EXAM: CT OF THE UPPER RIGHT EXTREMITY WITH CONTRAST TECHNIQUE: Multidetector CT imaging of the right hand and wrist was performed according to the standard protocol following intravenous contrast administration. RADIATION DOSE REDUCTION: This exam was performed according to the departmental dose-optimization program which includes automated exposure control, adjustment of the mA and/or kV according to patient size and/or use of iterative reconstruction technique. CONTRAST:  75mL OMNIPAQUE  IOHEXOL  350 MG/ML SOLN COMPARISON:  Radiographs 10/24/2023 and 01/10/2022 FINDINGS: Technical note: Despite efforts by the technologist and patient, moderate motion artifact is present on today's exam and could not be eliminated. This reduces exam sensitivity and specificity. Some images were repeated. Bones/Joint/Cartilage Allowing for the artifact and as correlated with recent radiographs, there is  no evidence of acute fracture, dislocation or osteomyelitis. The motion artifact is quite severe on the images through the metacarpals and distal forearm. The joint spaces appear preserved. No large joint effusions are identified. Ligaments Suboptimally assessed by CT. Muscles and Tendons Generalized dorsal subcutaneous edema. Difficult to exclude extensor tenosynovitis on this motion-degraded, noncontrast study. No intramuscular fluid collections are identified. Soft tissues Generalized dorsal subcutaneous edema in the wrist and hand. No evidence of foreign body or soft tissue emphysema. No focal fluid collection identified  on this motion-degraded, noncontrast study. IMPRESSION: 1. Generalized dorsal subcutaneous edema in the wrist and hand, suspicious for cellulitis. No evidence of foreign body or soft tissue emphysema. 2. No evidence of osteomyelitis or septic arthritis. 3. Technically limited examination due to motion artifact and lack of intravenous contrast. No organized fluid collections identified. Electronically Signed   By: Elsie Perone M.D.   On: 10/25/2023 09:14   DG Wrist Complete Right Result Date: 10/24/2023 CLINICAL DATA:  Swelling to the right arm after cocaine  use. Soft tissue redness and warmth. EXAM: RIGHT WRIST - COMPLETE 3+ VIEW COMPARISON:  01/10/2022 FINDINGS: Soft tissue swelling about the right wrist most prominent along the volar aspect and along the dorsal aspect of the metacarpal region. No radiopaque soft tissue foreign bodies or soft tissue gas. Bones appear intact. No focal bone lesion or bone destruction. Joint spaces are normal. No acute fracture or dislocation. IMPRESSION: Soft tissue swelling in the right wrist. No acute bony abnormalities. The Electronically Signed   By: Elsie Gravely M.D.   On: 10/24/2023 19:59   Subjective: Patient seen and examined at bedside.  Overnight events noted. Patient reports feeling better,  redness and swelling has improved.   She wants to be discharged early in the morning to go to her methadone  clinic and then go to [facility].  Discharge Exam: Vitals:   10/28/23 0354 10/28/23 0803  BP: 115/87 (!) 148/100  Pulse: 71 73  Resp: 15 17  Temp:  98.2 F (36.8 C)  SpO2: 96% 99%   Vitals:   10/27/23 1700 10/27/23 2104 10/28/23 0354 10/28/23 0803  BP: (!) 149/104 (!) 146/108 115/87 (!) 148/100  Pulse: 77 76 71 73  Resp: 16 15 15 17   Temp: 98.2 F (36.8 C)   98.2 F (36.8 C)  TempSrc: Oral     SpO2: 99% 99% 96% 99%  Weight:      Height:        General: Pt is alert, awake, not in acute distress Cardiovascular: RRR, S1/S2 +, no rubs, no  gallops Respiratory: CTA bilaterally, no wheezing, no rhonchi Abdominal: Soft, NT, ND, bowel sounds + Extremities: no edema, no cyanosis, right arm swelling, erythema improved    The results of significant diagnostics from this hospitalization (including imaging, microbiology, ancillary and laboratory) are listed below for reference.     Microbiology: Recent Results (from the past 240 hours)  Blood culture (routine x 2)     Status: None (Preliminary result)   Collection Time: 10/25/23  3:35 PM   Specimen: BLOOD RIGHT ARM  Result Value Ref Range Status   Specimen Description BLOOD RIGHT ARM  Final   Special Requests   Final    BOTTLES DRAWN AEROBIC AND ANAEROBIC Blood Culture adequate volume   Culture   Final    NO GROWTH 3 DAYS Performed at [facility], [address] [place], [place] [address]    Report Status PENDING  Incomplete  Blood culture (routine x  2)     Status: None (Preliminary result)   Collection Time: 10/25/23  3:50 PM   Specimen: BLOOD RIGHT HAND  Result Value Ref Range Status   Specimen Description BLOOD RIGHT HAND  Final   Special Requests   Final    BOTTLES DRAWN AEROBIC AND ANAEROBIC Blood Culture adequate volume   Culture   Final    NO GROWTH 3 DAYS Performed at [facility], [address] [place], [place] [address]    Report Status PENDING  Incomplete     Labs: BNP (last 3 results) No results for input(s): BNP in the last 8760 hours. Basic Metabolic Panel: Recent Labs  Lab 10/24/23 1941 10/26/23 0512  NA 134* 136  K 4.1 3.8  CL 98 102  CO2 22 27  GLUCOSE 341* 128*  BUN 18 15  CREATININE 1.34* 0.71  CALCIUM 8.6* 8.0*   Liver Function Tests: Recent Labs  Lab 10/24/23 1941  AST 39  ALT 58*  ALKPHOS 152*  BILITOT 0.6  PROT 6.8  ALBUMIN 3.2*   No results for input(s): LIPASE, AMYLASE in the last 168 hours. No results for input(s): AMMONIA in the last 168 hours. CBC: Recent Labs  Lab 10/24/23 1941  10/26/23 0512  WBC 11.1* 6.8  NEUTROABS 8.5*  --   HGB 12.1 11.6*  HCT 37.9 35.4*  MCV 92.2 90.8  PLT 308 249   Cardiac Enzymes: No results for input(s): CKTOTAL, CKMB, CKMBINDEX, TROPONINI in the last 168 hours. BNP: Invalid input(s): POCBNP CBG: Recent Labs  Lab 10/27/23 1622 10/27/23 2052 10/27/23 2146 10/28/23 0620 10/28/23 1148  GLUCAP 163* 49* 104* 234* 275*   D-Dimer No results for input(s): DDIMER in the last 72 hours. Hgb A1c Recent Labs    10/25/23 1535  HGBA1C 11.0*   Lipid Profile No results for input(s): CHOL, HDL, LDLCALC, TRIG, CHOLHDL, LDLDIRECT in the last 72 hours. Thyroid function studies No results for input(s): TSH, T4TOTAL, T3FREE, THYROIDAB in the last 72 hours.  Invalid input(s): FREET3 Anemia work up No results for input(s): VITAMINB12, FOLATE, FERRITIN, TIBC, IRON, RETICCTPCT in the last 72 hours. Urinalysis    Component Value Date/Time   COLORURINE YELLOW 10/25/2023 2230   APPEARANCEUR CLEAR 10/25/2023 2230   LABSPEC 1.036 (H) 10/25/2023 2230   PHURINE 6.0 10/25/2023 2230   GLUCOSEU >=500 (A) 10/25/2023 2230   HGBUR NEGATIVE 10/25/2023 2230   BILIRUBINUR NEGATIVE 10/25/2023 2230   KETONESUR NEGATIVE 10/25/2023 2230   PROTEINUR NEGATIVE 10/25/2023 2230   UROBILINOGEN 1.0 03/16/2015 1713   NITRITE NEGATIVE 10/25/2023 2230   LEUKOCYTESUR NEGATIVE 10/25/2023 2230   Sepsis Labs Recent Labs  Lab 10/24/23 1941 10/26/23 0512  WBC 11.1* 6.8   Microbiology Recent Results (from the past 240 hours)  Blood culture (routine x 2)     Status: None (Preliminary result)   Collection Time: 10/25/23  3:35 PM   Specimen: BLOOD RIGHT ARM  Result Value Ref Range Status   Specimen Description BLOOD RIGHT ARM  Final   Special Requests   Final    BOTTLES DRAWN AEROBIC AND ANAEROBIC Blood Culture adequate volume   Culture   Final    NO GROWTH 3 DAYS Performed at [facility], [address] [place], [place] [address]    Report Status PENDING  Incomplete  Blood culture (routine x 2)     Status: None (Preliminary result)   Collection Time: 10/25/23  3:50 PM   Specimen: BLOOD RIGHT HAND  Result  Value Ref Range Status   Specimen Description BLOOD RIGHT HAND  Final   Special Requests   Final    BOTTLES DRAWN AEROBIC AND ANAEROBIC Blood Culture adequate volume   Culture   Final    NO GROWTH 3 DAYS Performed at [facility], [address], [place], [place] [address]    Report Status PENDING  Incomplete     Time coordinating discharge: Over 30 minutes  SIGNED:   Darcel Dawley, MD  [facility] 10/28/2023, 2:44 PM Pager   If 7PM-7AM, please contact night-coverage

## 2023-10-28 NOTE — Progress Notes (Signed)
 CSW met with pt regarding SDOH: transportation.  Pt reports she lives with her mother, who can provide some transportation help.  She also walks and uses the bus.  Discussed [employer] transportation.  Pt reports she has used [employer] transport in the past, sometimes it worked well, she also had some problems when she was getting daily methadone  dose and they did not reliably show up.  She is willing to try again for future needs. Cathlyn Ferry, MSW, LCSW 9/30/20253:21 PM

## 2023-10-28 NOTE — Inpatient Diabetes Management (Signed)
 Inpatient Diabetes Program Recommendations  AACE/ADA: New Consensus Statement on Inpatient Glycemic Control (2015)  Target Ranges:  Prepandial:   less than 140 mg/dL      Peak postprandial:   less than 180 mg/dL (1-2 hours)      Critically ill patients:  140 - 180 mg/dL   Lab Results  Component Value Date   GLUCAP 234 (H) 10/28/2023   HGBA1C 11.0 (H) 10/25/2023    Review of Glycemic Control  Latest Reference Range & Units 10/27/23 06:37 10/27/23 11:36 10/27/23 16:22 10/27/23 20:52 10/27/23 21:46 10/28/23 06:20  Glucose-Capillary 70 - 99 mg/dL 697 (H) 845 (H) 836 (H) 49 (L) 104 (H) 234 (H)  (H): Data is abnormally high (L): Data is abnormally low  Inpatient Diabetes Program Recommendations:    Please consider decreasing correction to Novolog  0-9 units TID.  Thank you, Wyvonna Pinal, MSN, CDCES Diabetes Coordinator Inpatient Diabetes Program [phone] (team pager from 8a-5p)

## 2023-10-28 NOTE — Progress Notes (Signed)
 CSW spoke with pt in room, Ronal Gab also visiting pt.  CSW informed that Ronal is with [place] [facility] [place] diversion program, assisting pt with getting into substance abuse treatment at [facility].  Admission arranged for Tuesday.  Dr Leotis joined us , pt still on IV abx, may or may not be ready for DC tomorrow.  Plan agreed upon by all, Ronal will check with [facility] about delaying admission until Wednesday. Cathlyn Ferry, MSW, LCSW 9/30/20258:34 AM

## 2023-10-28 NOTE — TOC Progression Note (Addendum)
 Transition of Care [facility]) - Progression Note    Patient Details  Name: Anna Dennis MRN: [id number] Date of Birth: 08-Mar-1990  Transition of Care [facility]) CM/SW Contact  Bridget Cordella Simmonds, LCSW Phone Number: 10/28/2023, 10:55 AM  Clinical Narrative:   CSW left message with Ronal Gab, [phone], to confirm plan for pt to admit to [facility] tomorrow.   1150: TC Mary Houser: she is planning to pick pt up tomorrow morning.  Pt has to dose at the methanone clinic before 930am and then be to [facility] by noon.    MD informed to see what can be done to facilitate early discharge.                     Expected Discharge Plan and Services                                               Social Drivers of Health (SDOH) Interventions SDOH Screenings   Food Insecurity: No Food Insecurity (10/25/2023)  Housing: Low Risk  (10/25/2023)  Transportation Needs: Unmet Transportation Needs (10/25/2023)  Utilities: Not At Risk (10/25/2023)  Depression (PHQ2-9): High Risk (05/02/2023)  Tobacco Use: High Risk (10/24/2023)    Readmission Risk Interventions     No data to display

## 2023-10-28 NOTE — Progress Notes (Signed)
 Discharge summary (AVS)  plus TOC meds( hydralazine & augmentin ) provided with  instructions. Pt verbalized understanding of instructions. NO complaints.

## 2023-10-28 NOTE — Plan of Care (Signed)
   Problem: Education: Goal: Knowledge of General Education information will improve Description: Including pain rating scale, medication(s)/side effects and non-pharmacologic comfort measures Outcome: Progressing   Problem: Clinical Measurements: Goal: Will remain free from infection Outcome: Progressing

## 2023-10-29 ENCOUNTER — Other Ambulatory Visit (HOSPITAL_COMMUNITY): Payer: Self-pay

## 2023-10-29 LAB — VANCOMYCIN, PEAK: Vancomycin Pk: 26 ug/mL — ABNORMAL LOW (ref 30–40)

## 2023-10-29 LAB — GLUCOSE, CAPILLARY: Glucose-Capillary: 319 mg/dL — ABNORMAL HIGH (ref 70–99)

## 2023-10-29 MED ORDER — MM PEN NEEDLES 32G X 4 MM MISC: 0 refills | Status: AC

## 2023-10-29 MED ORDER — INSULIN LISPRO (1 UNIT DIAL) 100 UNIT/ML (KWIKPEN): 4.0000 [IU] | PEN_INJECTOR | Freq: Three times a day (TID) | SUBCUTANEOUS | 1 refills | Status: DC

## 2023-10-29 MED ORDER — BASAGLAR KWIKPEN 100 UNIT/ML ~~LOC~~ SOPN: 40.0000 [IU] | PEN_INJECTOR | Freq: Every day | SUBCUTANEOUS | 0 refills | Status: DC

## 2023-10-29 NOTE — Progress Notes (Addendum)
 Reprinted new discharge summary(AVS) provided to pt with instructions. Pt verbalized understanding of instructions. NO complaints. Pt d/c to [facility] as ordered, [employer] [facility] [place] to transport.TOC meds provided yesterday & home meds returned & signed by pt,  PIV from left arm removed prior to discharged.NO complaints.

## 2023-10-29 NOTE — Plan of Care (Signed)
  Problem: Education: Goal: Knowledge of General Education information will improve Description: Including pain rating scale, medication(s)/side effects and non-pharmacologic comfort measures Outcome: Progressing   Problem: Clinical Measurements: Goal: Ability to maintain clinical measurements within normal limits will improve Outcome: Progressing   Problem: Activity: Goal: Risk for activity intolerance will decrease Outcome: Progressing   Problem: Nutrition: Goal: Adequate nutrition will be maintained Outcome: Progressing   Problem: Pain Managment: Goal: General experience of comfort will improve and/or be controlled Outcome: Progressing   Problem: Clinical Measurements: Goal: Ability to avoid or minimize complications of infection will improve Outcome: Progressing

## 2023-10-29 NOTE — Plan of Care (Signed)
  Problem: Education: Goal: Knowledge of General Education information will improve Description: Including pain rating scale, medication(s)/side effects and non-pharmacologic comfort measures Outcome: Adequate for Discharge   Problem: Clinical Measurements: Goal: Will remain free from infection Outcome: Adequate for Discharge   Problem: Activity: Goal: Risk for activity intolerance will decrease Outcome: Adequate for Discharge   Problem: Pain Managment: Goal: General experience of comfort will improve and/or be controlled Outcome: Adequate for Discharge

## 2023-10-29 NOTE — TOC Transition Note (Signed)
 Transition of Care [facility]) - Discharge Note   Patient Details  Name: Anna Dennis MRN: [id number] Date of Birth: April 16, 1990  Transition of Care [facility]) CM/SW Contact:  Bridget Cordella Simmonds, LCSW Phone Number: 10/29/2023, 8:48 AM   Clinical Narrative:   Pt discharging to [facility] residential substance abuse treatment center.  [facility] Hous[facility] [place] to transport.  No RN report needed.     Final next level of care: Other (comment) (residential substance abuse treatment at [facility]) Barriers to Discharge: Barriers Resolved   Patient Goals and CMS Choice            Discharge Placement                Patient to be transferred to facility by: Ronal Gab      Discharge Plan and Services Additional resources added to the After Visit Summary for                                       Social Drivers of Health (SDOH) Interventions SDOH Screenings   Food Insecurity: No Food Insecurity (10/25/2023)  Housing: Low Risk  (10/25/2023)  Transportation Needs: Unmet Transportation Needs (10/25/2023)  Utilities: Not At Risk (10/25/2023)  Depression (PHQ2-9): High Risk (05/02/2023)  Tobacco Use: High Risk (10/24/2023)     Readmission Risk Interventions     No data to display

## 2023-10-30 LAB — CULTURE, BLOOD (ROUTINE X 2)
Culture: NO GROWTH
Culture: NO GROWTH
Special Requests: ADEQUATE
Special Requests: ADEQUATE

## 2023-11-26 ENCOUNTER — Ambulatory Visit: Payer: Self-pay | Admitting: Nurse Practitioner

## 2023-11-30 ENCOUNTER — Inpatient Hospital Stay (HOSPITAL_COMMUNITY): Payer: MEDICAID | Admitting: Anesthesiology

## 2023-11-30 ENCOUNTER — Emergency Department (HOSPITAL_COMMUNITY): Payer: MEDICAID

## 2023-11-30 ENCOUNTER — Encounter (HOSPITAL_COMMUNITY): Payer: Self-pay

## 2023-11-30 ENCOUNTER — Encounter (HOSPITAL_COMMUNITY): Admission: EM | Disposition: A | Payer: Self-pay | Source: Home / Self Care | Attending: Hospitalist

## 2023-11-30 ENCOUNTER — Other Ambulatory Visit: Payer: Self-pay

## 2023-11-30 ENCOUNTER — Inpatient Hospital Stay (HOSPITAL_COMMUNITY)
Admission: EM | Admit: 2023-11-30 | Discharge: 2023-12-05 | DRG: 580 | Disposition: A | Discharge status: Home / Self Care | Payer: MEDICAID | Attending: Internal Medicine | Admitting: Internal Medicine

## 2023-11-30 ENCOUNTER — Inpatient Hospital Stay (HOSPITAL_COMMUNITY): Payer: MEDICAID

## 2023-11-30 DIAGNOSIS — F1110 Opioid abuse, uncomplicated: Secondary | ICD-10-CM | POA: Diagnosis present

## 2023-11-30 DIAGNOSIS — I872 Venous insufficiency (chronic) (peripheral): Secondary | ICD-10-CM | POA: Diagnosis present

## 2023-11-30 DIAGNOSIS — L02415 Cutaneous abscess of right lower limb: Secondary | ICD-10-CM | POA: Diagnosis not present

## 2023-11-30 DIAGNOSIS — F17210 Nicotine dependence, cigarettes, uncomplicated: Secondary | ICD-10-CM | POA: Diagnosis present

## 2023-11-30 DIAGNOSIS — L0889 Other specified local infections of the skin and subcutaneous tissue: Secondary | ICD-10-CM | POA: Diagnosis not present

## 2023-11-30 DIAGNOSIS — Z79899 Other long term (current) drug therapy: Secondary | ICD-10-CM | POA: Diagnosis not present

## 2023-11-30 DIAGNOSIS — Z5982 Transportation insecurity: Secondary | ICD-10-CM | POA: Diagnosis not present

## 2023-11-30 DIAGNOSIS — E1065 Type 1 diabetes mellitus with hyperglycemia: Secondary | ICD-10-CM | POA: Diagnosis present

## 2023-11-30 DIAGNOSIS — B954 Other streptococcus as the cause of diseases classified elsewhere: Secondary | ICD-10-CM | POA: Diagnosis present

## 2023-11-30 DIAGNOSIS — K219 Gastro-esophageal reflux disease without esophagitis: Secondary | ICD-10-CM | POA: Diagnosis present

## 2023-11-30 DIAGNOSIS — Z818 Family history of other mental and behavioral disorders: Secondary | ICD-10-CM | POA: Diagnosis not present

## 2023-11-30 DIAGNOSIS — I10 Essential (primary) hypertension: Secondary | ICD-10-CM | POA: Diagnosis not present

## 2023-11-30 DIAGNOSIS — Z794 Long term (current) use of insulin: Secondary | ICD-10-CM

## 2023-11-30 DIAGNOSIS — B1920 Unspecified viral hepatitis C without hepatic coma: Secondary | ICD-10-CM | POA: Diagnosis present

## 2023-11-30 DIAGNOSIS — M65131 Other infective (teno)synovitis, right wrist: Secondary | ICD-10-CM | POA: Diagnosis not present

## 2023-11-30 DIAGNOSIS — B955 Unspecified streptococcus as the cause of diseases classified elsewhere: Secondary | ICD-10-CM | POA: Diagnosis not present

## 2023-11-30 DIAGNOSIS — Z9641 Presence of insulin pump (external) (internal): Secondary | ICD-10-CM | POA: Diagnosis present

## 2023-11-30 DIAGNOSIS — L0291 Cutaneous abscess, unspecified: Principal | ICD-10-CM

## 2023-11-30 DIAGNOSIS — D649 Anemia, unspecified: Secondary | ICD-10-CM | POA: Diagnosis present

## 2023-11-30 DIAGNOSIS — F32A Depression, unspecified: Secondary | ICD-10-CM | POA: Diagnosis present

## 2023-11-30 DIAGNOSIS — L02413 Cutaneous abscess of right upper limb: Principal | ICD-10-CM | POA: Diagnosis present

## 2023-11-30 DIAGNOSIS — E1052 Type 1 diabetes mellitus with diabetic peripheral angiopathy with gangrene: Secondary | ICD-10-CM | POA: Diagnosis present

## 2023-11-30 DIAGNOSIS — E871 Hypo-osmolality and hyponatremia: Secondary | ICD-10-CM | POA: Diagnosis present

## 2023-11-30 DIAGNOSIS — F429 Obsessive-compulsive disorder, unspecified: Secondary | ICD-10-CM | POA: Diagnosis present

## 2023-11-30 HISTORY — PX: INCISION AND DRAINAGE ABSCESS: SHX5864

## 2023-11-30 LAB — CBC WITH DIFFERENTIAL/PLATELET
Abs Immature Granulocytes: 0.02 K/uL (ref 0.00–0.07)
Basophils Absolute: 0.1 K/uL (ref 0.0–0.1)
Basophils Relative: 1 %
Eosinophils Absolute: 0.2 K/uL (ref 0.0–0.5)
Eosinophils Relative: 3 %
HCT: 35.3 % — ABNORMAL LOW (ref 36.0–46.0)
Hemoglobin: 11.6 g/dL — ABNORMAL LOW (ref 12.0–15.0)
Immature Granulocytes: 0 %
Lymphocytes Relative: 26 %
Lymphs Abs: 1.8 K/uL (ref 0.7–4.0)
MCH: 29.4 pg (ref 26.0–34.0)
MCHC: 32.9 g/dL (ref 30.0–36.0)
MCV: 89.4 fL (ref 80.0–100.0)
Monocytes Absolute: 0.4 K/uL (ref 0.1–1.0)
Monocytes Relative: 6 %
Neutro Abs: 4.3 K/uL (ref 1.7–7.7)
Neutrophils Relative %: 64 %
Platelets: 284 K/uL (ref 150–400)
RBC: 3.95 MIL/uL (ref 3.87–5.11)
RDW: 12.5 % (ref 11.5–15.5)
WBC: 6.7 K/uL (ref 4.0–10.5)
nRBC: 0 % (ref 0.0–0.2)

## 2023-11-30 LAB — GLUCOSE, CAPILLARY
Glucose-Capillary: 530 mg/dL (ref 70–99)
Glucose-Capillary: 330 mg/dL — ABNORMAL HIGH (ref 70–99)
Glucose-Capillary: 97 mg/dL (ref 70–99)
Glucose-Capillary: 105 mg/dL — ABNORMAL HIGH (ref 70–99)
Glucose-Capillary: 113 mg/dL — ABNORMAL HIGH (ref 70–99)

## 2023-11-30 LAB — BASIC METABOLIC PANEL WITH GFR
Anion gap: 11 (ref 5–15)
BUN: 17 mg/dL (ref 6–20)
CO2: 25 mmol/L (ref 22–32)
Calcium: 8.3 mg/dL — ABNORMAL LOW (ref 8.9–10.3)
Chloride: 94 mmol/L — ABNORMAL LOW (ref 98–111)
Creatinine, Ser: 0.91 mg/dL (ref 0.44–1.00)
GFR, Estimated: >60 mL/min (ref >60)
Glucose, Bld: 606 mg/dL (ref 70–99)
Potassium: 4.3 mmol/L (ref 3.5–5.1)
Sodium: 130 mmol/L — ABNORMAL LOW (ref 135–145)

## 2023-11-30 LAB — C-REACTIVE PROTEIN: CRP: 0.8 mg/dL (ref <1.0)

## 2023-11-30 LAB — CBG MONITORING, ED
Glucose-Capillary: 400 mg/dL — ABNORMAL HIGH (ref 70–99)
Glucose-Capillary: 549 mg/dL (ref 70–99)

## 2023-11-30 LAB — HEMOGLOBIN A1C
Hgb A1c MFr Bld: 10.6 % — ABNORMAL HIGH (ref 4.8–5.6)
Mean Plasma Glucose: 257.52 mg/dL

## 2023-11-30 LAB — PREGNANCY, URINE: Preg Test, Ur: NEGATIVE

## 2023-11-30 LAB — SEDIMENTATION RATE: Sed Rate: 7 mm/h (ref 0–22)

## 2023-11-30 SURGERY — INCISION AND DRAINAGE, ABSCESS
Anesthesia: General | Laterality: Right

## 2023-11-30 MED ORDER — VANCOMYCIN HCL IN DEXTROSE 1-5 GM/200ML-% IV SOLN
1000.0000 mg | Freq: Two times a day (BID) | INTRAVENOUS | Status: DC
  Administered 2023-12-01: 1000 mg via INTRAVENOUS
  Filled 2023-11-30: qty 200

## 2023-11-30 MED ORDER — SODIUM CHLORIDE 0.9 % IV SOLN
2.0000 g | Freq: Three times a day (TID) | INTRAVENOUS | Status: DC
  Administered 2023-11-30: 2 g via INTRAVENOUS
  Filled 2023-11-30: qty 12.5

## 2023-11-30 MED ORDER — HYDROMORPHONE HCL 1 MG/ML IJ SOLN
INTRAMUSCULAR | Status: AC
  Filled 2023-11-30: qty 1

## 2023-11-30 MED ORDER — ORAL CARE MOUTH RINSE: 15.0000 mL | Freq: Once | OROMUCOSAL | Status: AC

## 2023-11-30 MED ORDER — KETAMINE HCL 50 MG/5ML IJ SOSY
PREFILLED_SYRINGE | INTRAMUSCULAR | Status: AC
  Filled 2023-11-30: qty 5

## 2023-11-30 MED ORDER — MORPHINE SULFATE (PF) 4 MG/ML IV SOLN
4.0000 mg | Freq: Once | INTRAVENOUS | Status: AC
  Administered 2023-11-30: 4 mg via INTRAVENOUS
  Filled 2023-11-30: qty 1

## 2023-11-30 MED ORDER — MEPERIDINE HCL 25 MG/ML IJ SOLN: 6.2500 mg | INTRAMUSCULAR | Status: DC | PRN

## 2023-11-30 MED ORDER — OXYCODONE HCL 5 MG PO TABS: 5.0000 mg | ORAL_TABLET | Freq: Once | ORAL | Status: DC | PRN

## 2023-11-30 MED ORDER — ONDANSETRON HCL 4 MG/2ML IJ SOLN
INTRAMUSCULAR | Status: DC | PRN
  Administered 2023-11-30: 4 mg via INTRAVENOUS

## 2023-11-30 MED ORDER — GABAPENTIN 300 MG PO CAPS
300.0000 mg | ORAL_CAPSULE | Freq: Three times a day (TID) | ORAL | Status: DC
  Administered 2023-11-30 – 2023-12-05 (×14): 300 mg via ORAL
  Filled 2023-11-30 (×14): qty 1

## 2023-11-30 MED ORDER — HYDROMORPHONE HCL 1 MG/ML IJ SOLN
INTRAMUSCULAR | Status: DC | PRN
  Administered 2023-11-30 (×2): .25 mg via INTRAVENOUS

## 2023-11-30 MED ORDER — INSULIN ASPART 100 UNIT/ML IJ SOLN: 0.0000 [IU] | INTRAMUSCULAR | Status: DC

## 2023-11-30 MED ORDER — CHLORHEXIDINE GLUCONATE 0.12 % MT SOLN
OROMUCOSAL | Status: AC
  Filled 2023-11-30: qty 15

## 2023-11-30 MED ORDER — CHLORHEXIDINE GLUCONATE 0.12 % MT SOLN
OROMUCOSAL | Status: AC
Start: 2023-11-30 — End: 2023-11-30
  Filled 2023-11-30: qty 15

## 2023-11-30 MED ORDER — METHADONE HCL 10 MG PO TABS
150.0000 mg | ORAL_TABLET | Freq: Every day | ORAL | Status: DC
  Administered 2023-11-30: 150 mg via ORAL
  Filled 2023-11-30: qty 15

## 2023-11-30 MED ORDER — ACETAMINOPHEN 500 MG PO TABS
1000.0000 mg | ORAL_TABLET | Freq: Three times a day (TID) | ORAL | Status: DC
  Administered 2023-11-30 – 2023-12-05 (×12): 1000 mg via ORAL
  Filled 2023-11-30 (×12): qty 2

## 2023-11-30 MED ORDER — PHENYLEPHRINE 80 MCG/ML (10ML) SYRINGE FOR IV PUSH (FOR BLOOD PRESSURE SUPPORT)
PREFILLED_SYRINGE | INTRAVENOUS | Status: DC | PRN
  Administered 2023-11-30: 80 ug via INTRAVENOUS

## 2023-11-30 MED ORDER — VANCOMYCIN HCL IN DEXTROSE 1-5 GM/200ML-% IV SOLN: 1000.0000 mg | Freq: Two times a day (BID) | INTRAVENOUS | Status: DC

## 2023-11-30 MED ORDER — IOHEXOL 350 MG/ML SOLN
75.0000 mL | Freq: Once | INTRAVENOUS | Status: AC | PRN
  Administered 2023-11-30: 75 mL via INTRAVENOUS

## 2023-11-30 MED ORDER — INSULIN ASPART 100 UNIT/ML IJ SOLN
0.0000 [IU] | INTRAMUSCULAR | Status: DC | PRN
  Administered 2023-11-30: 10 [IU] via SUBCUTANEOUS

## 2023-11-30 MED ORDER — FENTANYL CITRATE (PF) 250 MCG/5ML IJ SOLN
INTRAMUSCULAR | Status: DC | PRN
  Administered 2023-11-30: 100 ug via INTRAVENOUS

## 2023-11-30 MED ORDER — ACETAMINOPHEN 325 MG PO TABS
650.0000 mg | ORAL_TABLET | Freq: Once | ORAL | Status: AC
  Administered 2023-11-30: 650 mg via ORAL
  Filled 2023-11-30: qty 2

## 2023-11-30 MED ORDER — INSULIN ASPART 100 UNIT/ML IJ SOLN
INTRAMUSCULAR | Status: AC
  Filled 2023-11-30: qty 10

## 2023-11-30 MED ORDER — OXYCODONE HCL 5 MG/5ML PO SOLN: 5.0000 mg | Freq: Once | ORAL | Status: DC | PRN

## 2023-11-30 MED ORDER — SUCCINYLCHOLINE CHLORIDE 200 MG/10ML IV SOSY
PREFILLED_SYRINGE | INTRAVENOUS | Status: DC | PRN
  Administered 2023-11-30: 100 mg via INTRAVENOUS

## 2023-11-30 MED ORDER — HYDROMORPHONE HCL 1 MG/ML IJ SOLN
INTRAMUSCULAR | Status: AC
  Filled 2023-11-30: qty 0.5

## 2023-11-30 MED ORDER — CHLORHEXIDINE GLUCONATE 0.12 % MT SOLN
15.0000 mL | Freq: Once | OROMUCOSAL | Status: AC
  Administered 2023-11-30: 15 mL via OROMUCOSAL

## 2023-11-30 MED ORDER — VANCOMYCIN HCL 1.5 G IV SOLR
1500.0000 mg | Freq: Once | INTRAVENOUS | Status: DC
  Filled 2023-11-30: qty 30

## 2023-11-30 MED ORDER — 0.9 % SODIUM CHLORIDE (POUR BTL) OPTIME
TOPICAL | Status: DC | PRN
  Administered 2023-11-30: 1000 mL

## 2023-11-30 MED ORDER — MIDAZOLAM HCL (PF) 2 MG/2ML IJ SOLN
0.5000 mg | Freq: Once | INTRAMUSCULAR | Status: AC | PRN
  Administered 2023-11-30: 1 mg via INTRAVENOUS

## 2023-11-30 MED ORDER — INSULIN ASPART 100 UNIT/ML IJ SOLN
0.0000 [IU] | Freq: Three times a day (TID) | INTRAMUSCULAR | Status: DC
  Administered 2023-12-01: 8 [IU] via SUBCUTANEOUS

## 2023-11-30 MED ORDER — MIDAZOLAM HCL (PF) 2 MG/2ML IJ SOLN
INTRAMUSCULAR | Status: DC | PRN
  Administered 2023-11-30 (×2): 1 mg via INTRAVENOUS

## 2023-11-30 MED ORDER — MIDAZOLAM HCL 2 MG/2ML IJ SOLN
INTRAMUSCULAR | Status: AC
Start: 2023-11-30 — End: 2023-11-30
  Filled 2023-11-30: qty 2

## 2023-11-30 MED ORDER — LACTATED RINGERS IV BOLUS
1000.0000 mL | Freq: Once | INTRAVENOUS | Status: AC
  Administered 2023-11-30: 1000 mL via INTRAVENOUS

## 2023-11-30 MED ORDER — SODIUM CHLORIDE 0.9 % IR SOLN
Status: DC | PRN
  Administered 2023-11-30: 6000 mL

## 2023-11-30 MED ORDER — VANCOMYCIN HCL 1500 MG/300ML IV SOLN
1500.0000 mg | Freq: Once | INTRAVENOUS | Status: AC
  Administered 2023-11-30: 1500 mg via INTRAVENOUS
  Filled 2023-11-30: qty 300

## 2023-11-30 MED ORDER — PROPOFOL 10 MG/ML IV BOLUS
INTRAVENOUS | Status: AC
Start: 2023-11-30 — End: 2023-11-30
  Filled 2023-11-30: qty 20

## 2023-11-30 MED ORDER — INSULIN PUMP
SUBCUTANEOUS | Status: DC
Start: 2023-11-30 — End: 2023-11-30
  Filled 2023-11-30: qty 1

## 2023-11-30 MED ORDER — HYDROMORPHONE HCL 1 MG/ML IJ SOLN
0.2500 mg | INTRAMUSCULAR | Status: DC | PRN
  Administered 2023-11-30 (×6): 0.5 mg via INTRAVENOUS

## 2023-11-30 MED ORDER — LIDOCAINE 2% (20 MG/ML) 5 ML SYRINGE
INTRAMUSCULAR | Status: DC | PRN
  Administered 2023-11-30: 60 mg via INTRAVENOUS

## 2023-11-30 MED ORDER — MIDAZOLAM HCL 2 MG/2ML IJ SOLN
INTRAMUSCULAR | Status: AC
  Filled 2023-11-30: qty 2

## 2023-11-30 MED ORDER — PROPOFOL 10 MG/ML IV BOLUS
INTRAVENOUS | Status: DC | PRN
  Administered 2023-11-30: 200 mg via INTRAVENOUS

## 2023-11-30 MED ORDER — ONDANSETRON HCL 4 MG/2ML IJ SOLN
4.0000 mg | Freq: Once | INTRAMUSCULAR | Status: AC
  Administered 2023-11-30: 4 mg via INTRAVENOUS
  Filled 2023-11-30: qty 2

## 2023-11-30 MED ORDER — KETAMINE HCL 50 MG/5ML IJ SOSY
PREFILLED_SYRINGE | INTRAMUSCULAR | Status: DC | PRN
  Administered 2023-11-30 (×2): 10 mg via INTRAVENOUS

## 2023-11-30 MED ORDER — VANCOMYCIN HCL IN DEXTROSE 1-5 GM/200ML-% IV SOLN
INTRAVENOUS | Status: AC
  Filled 2023-11-30: qty 200

## 2023-11-30 MED ORDER — PIPERACILLIN-TAZOBACTAM 3.375 G IVPB 30 MIN
3.3750 g | Freq: Once | INTRAVENOUS | Status: DC
  Filled 2023-11-30: qty 50

## 2023-11-30 MED ORDER — METRONIDAZOLE 500 MG PO TABS
500.0000 mg | ORAL_TABLET | Freq: Two times a day (BID) | ORAL | Status: DC
  Administered 2023-11-30: 500 mg via ORAL
  Filled 2023-11-30 (×2): qty 1

## 2023-11-30 MED ORDER — OXYCODONE HCL 5 MG PO TABS
10.0000 mg | ORAL_TABLET | ORAL | Status: DC | PRN
  Administered 2023-11-30 – 2023-12-04 (×14): 10 mg via ORAL
  Filled 2023-11-30 (×14): qty 2

## 2023-11-30 MED ORDER — OXYCODONE HCL 5 MG PO TABS
5.0000 mg | ORAL_TABLET | ORAL | Status: DC | PRN
  Administered 2023-12-01 – 2023-12-04 (×2): 5 mg via ORAL
  Filled 2023-11-30 (×2): qty 1

## 2023-11-30 MED ORDER — LACTATED RINGERS IV SOLN: INTRAVENOUS | Status: DC

## 2023-11-30 MED ORDER — HYDROMORPHONE HCL 1 MG/ML IJ SOLN: 0.5000 mg | INTRAMUSCULAR | Status: DC | PRN

## 2023-11-30 MED ORDER — FENTANYL CITRATE (PF) 100 MCG/2ML IJ SOLN
INTRAMUSCULAR | Status: AC
  Filled 2023-11-30: qty 2

## 2023-11-30 MED ORDER — NICOTINE 14 MG/24HR TD PT24
14.0000 mg | MEDICATED_PATCH | Freq: Once | TRANSDERMAL | Status: AC
  Administered 2023-11-30: 14 mg via TRANSDERMAL
  Filled 2023-11-30: qty 1

## 2023-11-30 MED ORDER — GADOBUTROL 1 MMOL/ML IV SOLN
7.0000 mL | Freq: Once | INTRAVENOUS | Status: AC | PRN
  Administered 2023-11-30: 7 mL via INTRAVENOUS

## 2023-11-30 MED ORDER — METHOCARBAMOL 500 MG PO TABS
500.0000 mg | ORAL_TABLET | Freq: Three times a day (TID) | ORAL | Status: DC
  Administered 2023-11-30 – 2023-12-05 (×14): 500 mg via ORAL
  Filled 2023-11-30 (×14): qty 1

## 2023-11-30 MED ORDER — ENOXAPARIN SODIUM 40 MG/0.4ML IJ SOSY
40.0000 mg | PREFILLED_SYRINGE | INTRAMUSCULAR | Status: DC
  Administered 2023-12-01 – 2023-12-05 (×5): 40 mg via SUBCUTANEOUS
  Filled 2023-11-30 (×5): qty 0.4

## 2023-11-30 MED ORDER — VANCOMYCIN HCL IN DEXTROSE 1-5 GM/200ML-% IV SOLN
1000.0000 mg | Freq: Two times a day (BID) | INTRAVENOUS | Status: DC
Start: 2023-11-30 — End: 2023-11-30

## 2023-11-30 SURGICAL SUPPLY — 28 items
BAG COUNTER SPONGE SURGICOUNT (BAG) ×1 IMPLANT
BNDG COMPR ESMARK 6X3 LF (GAUZE/BANDAGES/DRESSINGS) ×1 IMPLANT
BNDG ELASTIC 4INX 5YD STR LF (GAUZE/BANDAGES/DRESSINGS) IMPLANT
BNDG GAUZE DERMACEA FLUFF 4 (GAUZE/BANDAGES/DRESSINGS) ×1 IMPLANT
COVER SURGICAL LIGHT HANDLE (MISCELLANEOUS) ×1 IMPLANT
CUFF TOURN SGL QUICK 18X4 (TOURNIQUET CUFF) IMPLANT
CUFF TRNQT CYL 34X4.125X (TOURNIQUET CUFF) IMPLANT
DURAPREP 26ML APPLICATOR (WOUND CARE) ×1 IMPLANT
GAUZE PAD ABD 8X10 STRL (GAUZE/BANDAGES/DRESSINGS) ×1 IMPLANT
GAUZE SPONGE 4X4 12PLY STRL (GAUZE/BANDAGES/DRESSINGS) ×1 IMPLANT
GAUZE XEROFORM 1X8 LF (GAUZE/BANDAGES/DRESSINGS) IMPLANT
GLOVE BIOGEL M 7.0 STRL (GLOVE) ×1 IMPLANT
GLOVE BIOGEL M STRL SZ7.5 (GLOVE) IMPLANT
GLOVE BIOGEL PI IND STRL 7.5 (GLOVE) ×2 IMPLANT
GLOVE BIOGEL PI IND STRL 8.5 (GLOVE) ×1 IMPLANT
GLOVE ECLIPSE 8.0 STRL XLNG CF (GLOVE) IMPLANT
GLOVE ORTHO TXT STRL SZ7.5 (GLOVE) ×2 IMPLANT
GLOVE SURG ORTHO 8.5 STRL (GLOVE) ×3 IMPLANT
GOWN STRL REUS W/ TWL LRG LVL3 (GOWN DISPOSABLE) ×1 IMPLANT
GOWN STRL REUS W/ TWL XL LVL3 (GOWN DISPOSABLE) ×3 IMPLANT
KIT BASIN OR (CUSTOM PROCEDURE TRAY) ×1 IMPLANT
MANIFOLD NEPTUNE II (INSTRUMENTS) ×1 IMPLANT
PACK ORTHO EXTREMITY (CUSTOM PROCEDURE TRAY) ×1 IMPLANT
PAD CAST 4YDX4 CTTN HI CHSV (CAST SUPPLIES) ×1 IMPLANT
SET HNDPC FAN SPRY TIP SCT (DISPOSABLE) ×1 IMPLANT
SPLINT FIBERGLASS 3X35 (CAST SUPPLIES) IMPLANT
SYR CONTROL 10ML LL (SYRINGE) ×1 IMPLANT
TOWEL GREEN STERILE (TOWEL DISPOSABLE) ×2 IMPLANT

## 2023-11-30 NOTE — Plan of Care (Signed)
  Problem: Metabolic: Goal: Ability to maintain appropriate glucose levels will improve Outcome: Progressing   Problem: Nutritional: Goal: Maintenance of adequate nutrition will improve Outcome: Progressing   Problem: Skin Integrity: Goal: Risk for impaired skin integrity will decrease Outcome: Progressing   Problem: Tissue Perfusion: Goal: Adequacy of tissue perfusion will improve Outcome: Progressing   

## 2023-11-30 NOTE — ED Provider Notes (Signed)
 [facility] Provider Note   CSN: [id number] Arrival date & time: 11/30/23  9674     Patient presents with: Abscess   Pascale Dennis is a 33 y.o. female with PMHx T1DM, GERD, IVDU with associated wrist abscess/infection who presents to ED concerned for abscess of right wrist. Symptoms started a couple days prior to initial hospital admission on 9/27 with pain and erythema of prior IVDU injection site. Patient was admitted for IV ABX and was eventually discharged. Patient stating that abscess reoccurred a couple weeks ago while they were at a [facility]stance Abuse [facility]hab program, and a provider there drained the abscess 2 separate times. Patient was then placed back onto clindamycin . The wrist infection was getting better after the drainage and ABX. Patient stating that infection quickly started getting worse again the day after the ABX course had ended. She was placed back onto clindamycin  10/30 and erythema, pain, and draining purulence continues to worsen.  Patient denies any other infectious symptoms such as fever, nausea, vomiting, diarrhea.     Abscess      Prior to Admission medications   Medication Sig Start Date End Date Taking? Authorizing Provider  glucose blood (GLUCOSE METER TEST) test strip Use as instructed 07/14/20   Lue Elsie BROCKS, MD  hydrALAZINE (APRESOLINE) 25 MG tablet Take 1 tablet (25 mg total) by mouth 3 (three) times daily. 10/28/23 11/27/23  Leotis Bogus, MD  Insulin  Glargine (BASAGLAR  KWIKPEN) 100 UNIT/ML Inject 40 Units into the skin at bedtime. 10/29/23 11/28/23  Fausto Burnard LABOR, DO  insulin  lispro (HUMALOG  KWIKPEN) 100 UNIT/ML KwikPen Inject 4-30 Units into the skin 3 (three) times daily before meals. 10/29/23   Fausto Burnard LABOR, DO  Insulin  Pen Needle (MM PEN NEEDLES) 32G X 4 MM MISC To be used with insulin  as instructed 10/29/23   Fausto Burnard A, DO  methadone  (DOLOPHINE ) 10 MG/5ML solution Take 150 mg by  mouth daily at 12 noon.    [provider]  insulin  aspart (NOVOLOG ) 100 UNIT/ML injection Inject 6 Units into the skin 3 (three) times daily with meals. 02/22/12 02/28/12  Abrol, Nayana, MD  Omeprazole  20 MG TBEC Take 1 tablet (20 mg total) by mouth daily. 02/28/12 03/04/12  Vicci Afton CROME, MD    Allergies: Patient has no known allergies.    Review of Systems  Skin:        abscess    Updated Vital Signs BP 127/86 (BP Location: Right Arm)   Pulse 88   Temp 97.6 F (36.4 C) (Oral)   Resp 17   Ht 5' 7 (1.702 m)   Wt 72.6 kg   SpO2 100%   BMI 25.06 kg/m   Physical Exam Vitals and nursing note reviewed.  Constitutional:      General: She is not in acute distress.    Appearance: She is not ill-appearing or toxic-appearing.  HENT:     Head: Normocephalic and atraumatic.     Mouth/Throat:     Mouth: Mucous membranes are moist.  Eyes:     General: No scleral icterus.       Right eye: No discharge.        Left eye: No discharge.     Conjunctiva/sclera: Conjunctivae normal.  Cardiovascular:     Rate and Rhythm: Normal rate and regular rhythm.     Pulses: Normal pulses.     Heart sounds: Normal heart sounds. No murmur heard. Pulmonary:     Effort: Pulmonary effort  is normal. No respiratory distress.     Breath sounds: Normal breath sounds. No wheezing, rhonchi or rales.  Abdominal:     General: Abdomen is flat.  Musculoskeletal:     Right lower leg: No edema.     Left lower leg: No edema.  Skin:    General: Skin is warm and dry.     Findings: No rash.     Comments: Draining abscess of right wrist. Spreading erythema surrounding. +2 radial pulse. Sensation to light touch intact. Area non-tense. Brisk capillary refill.  Active ROM mildly/moderately restricted d/t pain.  Neurological:     General: No focal deficit present.     Mental Status: She is alert and oriented to person, place, and time. Mental status is at baseline.  Psychiatric:        Mood and Affect:  Mood normal.     (all labs ordered are listed, but only abnormal results are displayed) Labs Reviewed  CBC WITH DIFFERENTIAL/PLATELET - Abnormal; Notable for the following components:      Result Value   Hemoglobin 11.6 (*)    HCT 35.3 (*)    All other components within normal limits  BASIC METABOLIC PANEL WITH GFR - Abnormal; Notable for the following components:   Sodium 130 (*)    Chloride 94 (*)    Glucose, Bld 606 (*)    Calcium 8.3 (*)    All other components within normal limits  CBG MONITORING, ED - Abnormal; Notable for the following components:   Glucose-Capillary 400 (*)    All other components within normal limits  CULTURE, BLOOD (ROUTINE X 2)  CULTURE, BLOOD (ROUTINE X 2)  AEROBIC/ANAEROBIC CULTURE W GRAM STAIN (SURGICAL/DEEP WOUND)  PREGNANCY, URINE  SEDIMENTATION RATE  C-REACTIVE PROTEIN  HEMOGLOBIN A1C    EKG: None  Radiology: DG Wrist Complete Right Result Date: 11/30/2023 EXAM: 3 OR MORE VIEW(S) XRAY OF THE RIGHT WRIST 11/30/2023 08:11:00 AM COMPARISON: Right wrist series dated 10/24/2023. CLINICAL HISTORY: abscess abscess FINDINGS: BONES AND JOINTS: No acute fracture. No focal osseous lesion. No joint dislocation. No foreign bodies are evident. SOFT TISSUES: There is moderate diffuse soft tissue swelling. IMPRESSION: 1. Moderate diffuse soft tissue swelling, possibly related to the reported abscess. No foreign bodies identified. 2. No acute osseous abnormality. Electronically signed by: Evalene Coho MD 11/30/2023 08:43 AM EST RP Workstation: [id number]     .Ultrasound ED Peripheral IV (Provider)  Date/Time: 11/30/2023 8:50 AM  Performed by: Hoy Nidia FALCON, PA-C Authorized by: Hoy Nidia FALCON, PA-C   Procedure details:    Indications: hydration     Indications comment:  IV ABX   Skin Prep: chlorhexidine  gluconate     Location:  Right forearm   Angiocath:  20 G   Bedside Ultrasound Guided: Yes     Patient tolerated procedure without  complications: Yes     Dressing applied: Yes   .Critical Care  Performed by: Hoy Nidia FALCON, PA-C Authorized by: Hoy Nidia FALCON, PA-C   Critical care provider statement:    Critical care time (minutes):  30   Critical care was necessary to treat or prevent imminent or life-threatening deterioration of the following conditions: recurring abscess.   Critical care was time spent personally by me on the following activities:  Development of treatment plan with patient or surrogate, discussions with consultants, evaluation of patient's response to treatment, examination of patient, ordering and review of laboratory studies, ordering and review of radiographic studies, ordering and performing treatments and interventions, pulse  oximetry, re-evaluation of patient's condition and review of old charts   Care discussed with: admitting provider      Medications Ordered in the ED  nicotine  (NICODERM CQ  - dosed in mg/24 hours) patch 14 mg (14 mg Transdermal Patch Applied 11/30/23 0835)  enoxaparin  (LOVENOX ) injection 40 mg (has no administration in time range)  insulin  pump (has no administration in time range)  metroNIDAZOLE  (FLAGYL ) tablet 500 mg (has no administration in time range)  methadone  (DOLOPHINE ) tablet 150 mg (has no administration in time range)  acetaminophen  (TYLENOL ) tablet 1,000 mg (has no administration in time range)  methocarbamol (ROBAXIN) tablet 500 mg (has no administration in time range)  gabapentin (NEURONTIN) capsule 300 mg (has no administration in time range)  Vancomycin  (VANCOCIN ) 1,500 mg in sodium chloride  0.9 % 500 mL IVPB (has no administration in time range)  ceFEPIme (MAXIPIME) 2 g in sodium chloride  0.9 % 100 mL IVPB (has no administration in time range)  lactated ringers  infusion (has no administration in time range)  chlorhexidine  (PERIDEX ) 0.12 % solution 15 mL (has no administration in time range)    Or  Oral care mouth rinse (has no administration in  time range)  morphine  (PF) 4 MG/ML injection 4 mg (4 mg Intravenous Given 11/30/23 0853)  ondansetron  (ZOFRAN ) injection 4 mg (4 mg Intravenous Given 11/30/23 0852)  acetaminophen  (TYLENOL ) tablet 650 mg (650 mg Oral Given 11/30/23 0834)  lactated ringers  bolus 1,000 mL (0 mLs Intravenous Stopped 11/30/23 1024)  iohexol  (OMNIPAQUE ) 350 MG/ML injection 75 mL (75 mLs Intravenous Contrast Given 11/30/23 1027)                                    Medical Decision Making Amount and/or Complexity of Data Reviewed Labs: ordered. Radiology: ordered.  Risk OTC drugs. Prescription drug management. Decision regarding hospitalization.    This patient presents to the ED for concern of abscess, this involves an extensive number of treatment options, and is a complaint that carries with it a high risk of complications and morbidity.  The differential diagnosis includes cellulitis, abscess, sepsis, folliculitis, necrotizing fasciitis, impetigo   Co morbidities that complicate the patient evaluation   T1DM, GERD, IVDU with associated wrist abscess/infection   Additional history obtained:  Additional history obtained from 9/30 discharge summary: patient discharged on Augmentin  and sent to [facility] substance abuse program.  Paperwork provided from patient showing that she was prescribed Clindamycin  while inpatient there. Last clindamycin  course started 10/30.    Problem List / ED Course / Critical interventions / Medication management  Patient presents to ED concerned for right wrist abscess. Symptoms have been intermittent since 9/27, but worsening this week despite being on Clindamycin . Patient without other infectious symptoms. Patient initially with mild tachycardia at 109 and low grade fever at 99.6. patient otherwise NAD and rest of vitals are stable. There is a draining abscess to right wrist.  I Ordered, and personally interpreted labs.  UPT negative.  CBC without leukocytosis.  There is  mild anemia with hemoglobin 11.6.  BMP with mild hyponatremia at 130.  Chloride is also low at 94.  There is hyperglycemia at 606 which downtrended to 400 on repeat CBG monitoring.  Wound culture sent to lab. I ordered imaging studies including right wrist xray. I independently visualized and interpreted imaging which showed soft tissue swelling. I agree with the radiologist interpretation I requested consultation with the hand surgeon  on-call Dr. Teresa,  and discussed lab and imaging findings as well as pertinent plan - they recommend: CT scan, hold ABX, NPO, hospital admission. Consulted with Dr. Georgina who agrees to admit patient.  I have reviewed the patients home medicines and have made adjustments as needed.   Social Determinants of Health:  IVDU      Final diagnoses:  Abscess    ED Discharge Orders     None          Hoy Nidia FALCON, [place] 11/30/23 1039    Patsey Lot, MD 12/01/23 [phone]

## 2023-11-30 NOTE — ED Notes (Signed)
 This paramedic received call from front desk that patient went out side to smoke a cigarette and is now asking to come back to the room.

## 2023-11-30 NOTE — Anesthesia Postprocedure Evaluation (Signed)
 Anesthesia Post Note  Patient: [facility]  Procedure(s) Performed: INCISION AND DRAINAGE, ABSCESS Right wrist (Right)     Patient location during evaluation: PACU Anesthesia Type: General Level of consciousness: oriented, patient cooperative and sedated Pain control: pt finally improving, pt able to relax. Vital Signs Assessment: post-procedure vital signs reviewed and stable Respiratory status: spontaneous breathing, nonlabored ventilation and respiratory function stable Cardiovascular status: blood pressure returned to baseline and stable Postop Assessment: no apparent nausea or vomiting Anesthetic complications: no   No notable events documented.  Last Vitals:  Vitals:   11/30/23 1415 11/30/23 1430  BP: (!) 179/102 (!) 182/106  Pulse:  79  Resp:  18  Temp:    SpO2:  97%    Last Pain:  Vitals:   11/30/23 1433  TempSrc:   PainSc: 10-Worst pain ever                 Aynsley Fleet,E. Vylet Maffia

## 2023-11-30 NOTE — ED Notes (Signed)
 ABX stopped, pt sent OR. Will resume after

## 2023-11-30 NOTE — Anesthesia Preprocedure Evaluation (Addendum)
 Anesthesia Evaluation  Patient identified by MRN, date of birth, ID band Patient awake    Reviewed: Allergy & Precautions, NPO status , Patient's Chart, lab work & pertinent test results  History of Anesthesia Complications Negative for: history of anesthetic complications  Airway Mallampati: I  TM Distance: >3 FB Neck ROM: Full    Dental  (+) Chipped, Missing, Dental Advisory Given   Pulmonary Current SmokerPatient did not abstain from smoking.   breath sounds clear to auscultation       Cardiovascular hypertension, Pt. on medications  Rhythm:Regular Rate:Normal  '22 ECHO: EF 65 to 70%.  1. The LV has normal function, no regional wall motion abnormalities. Left ventricular diastolic parameters were normal.   2. RVF is normal. The right ventricular size is normal.   3. The mitral valve is normal in structure. No evidence of mitral valve regurgitation.   4. The aortic valve is tricuspid. Aortic valve regurgitation is not visualized. No aortic stenosis is present.     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, Hepatitis -, C  Endo/Other  diabetes (glu 530), Insulin  Dependent    Renal/GU negative Renal ROS     Musculoskeletal  (+)  narcotic dependent  Abdominal   Peds  Hematology Hb 11.6, plt 284k   Anesthesia Other Findings   Reproductive/Obstetrics                              Anesthesia Physical Anesthesia Plan  ASA: 4  Anesthesia Plan: General   Post-op Pain Management: Tylenol  PO (pre-op)*   Induction: Intravenous  PONV Risk Score and Plan: 2 and Ondansetron  and Dexamethasone   Airway Management Planned: Oral ETT  Additional Equipment: None  Intra-op Plan:   Post-operative Plan: Extubation in OR  Informed Consent: I have reviewed the  patients History and Physical, chart, labs and discussed the procedure including the risks, benefits and alternatives for the proposed anesthesia with the patient or authorized representative who has indicated his/her understanding and acceptance.     Dental advisory given  Plan Discussed with: CRNA and Surgeon  Anesthesia Plan Comments: (Team suspects drug use today)         Anesthesia Quick Evaluation

## 2023-11-30 NOTE — Transfer of Care (Signed)
 Immediate Anesthesia Transfer of Care Note  Patient: [facility] Dennis  Procedure(s) Performed: INCISION AND DRAINAGE, ABSCESS Right wrist (Right)  Patient Location: PACU  Anesthesia Type:General  Level of Consciousness: drowsy  Airway & Oxygen Therapy: Patient Spontanous Breathing and Patient connected to face mask oxygen  Post-op Assessment: Report given to RN and Post -op Vital signs reviewed and stable  Post vital signs: Reviewed and stable  Last Vitals:  Vitals Value Taken Time  BP 181/110 11/30/23 13:32  Temp    Pulse 77 11/30/23 13:33  Resp 10 11/30/23 13:33  SpO2 100 % 11/30/23 13:33  Vitals shown include unfiled device data.  Last Pain:  Vitals:   11/30/23 1129  TempSrc: Oral  PainSc: 3       Patients Stated Pain Goal: 1 (11/30/23 1129)  Complications: No notable events documented.

## 2023-11-30 NOTE — Progress Notes (Signed)
 Pharmacy Antibiotic Note  Anna Dennis is a 33 y.o. female admitted on 11/30/2023 with wound infection.  Pharmacy has been consulted for vancomycin  dosing.  Plan: Vancomycin  1500 mg IV x 1, then 1g IV q 12h (eAUC 512) Monitor renal function, Cx and clinical progression to narrow Vancomycin  levels as indicated  Height: 5' 7 (170.2 cm) Weight: 72.6 kg (160 lb) IBW/kg (Calculated) : 61.6  Temp (24hrs), Avg:98.6 F (37 C), Min:97.6 F (36.4 C), Max:99.6 F (37.6 C)  Recent Labs  Lab 11/30/23 0743  WBC 6.7  CREATININE 0.91    Estimated Creatinine Clearance: 84.7 mL/min (by C-G formula based on SCr of 0.91 mg/dL).    No Known Allergies  Dorn Poot, PharmD, [facility] Clinical Pharmacist ED Pharmacist Phone # [phone] 11/30/2023 10:36 AM

## 2023-11-30 NOTE — Progress Notes (Signed)
 Pts mother took pts back pack with her. Items left are pts clothing, shoes, jacket, and phone and charger.

## 2023-11-30 NOTE — ED Notes (Signed)
 Unable to get blood work CHARITY FUNDRAISER aware

## 2023-11-30 NOTE — ED Notes (Signed)
 PT hard to arouse and seems more drowsy than before.

## 2023-11-30 NOTE — ED Notes (Signed)
Pt called for room, no response. 

## 2023-11-30 NOTE — Progress Notes (Signed)
 Pts Kpad at bedside. Pt asleep at this time with extremity elevated. Will pass on to night shift regarding kpad set up.

## 2023-11-30 NOTE — Consult Note (Signed)
 ORTHOPAEDIC CONSULTATION  REQUESTING PHYSICIAN: Patsey Lot, MD  PCP:  Oley Bascom RAMAN, NP  Chief Complaint: Right wrist infection with abscess x 1 month  HPI: Anna Dennis is a 33 y.o. female with history of IVDU who complains of  1 month history of right wrist abscess x 1 month.  The patient stated that the infection began 10/25/23 after IV drug use and she was treated for cellulitis with keflex , but had worsening erythema and was switched to PO clindamycin .  She was subsequently in a drug rehab where she underwent two I&D's of the right wrist and initialing was improving in her symptoms until she came off the PO clinda on 10/30 and then began to have worsening pain and drainage from her right wrist.  She has a history of hepatitis C, DM last A1C of 11 on 10/25/2023, and hepatitis C. She uses heroin, marijuana and cocaine . She has had similar abscesses of the left upper extremity which required multiple debridements. The patient is a current smoker 1 pack per day. She is RHD  and works at [employer]  Past Medical History:  Diagnosis Date   Depression    Diabetes mellitus    Insulin  pump   GERD (gastroesophageal reflux disease)    Hepatitis C    Heroin abuse (HCC)    Iron deficiency anemia    IVDU (intravenous drug user)    OCD (obsessive compulsive disorder)    Past Surgical History:  Procedure Laterality Date   I & D EXTREMITY Left 07/11/2020   Procedure: IRRIGATION AND DEBRIDEMENT EXTREMITY;  Surgeon: Elsa Lonni SAUNDERS, MD;  Location: [facility] OR;  Service: Orthopedics;  Laterality: Left;   I & D EXTREMITY Left 08/27/2020   Procedure: IRRIGATION AND DEBRIDEMENT HAND;  Surgeon: Elisabeth Craig RAMAN, MD;  Location: [facility] OR;  Service: Plastics;  Laterality: Left;   SKIN SPLIT GRAFT Left 04/16/2021   Procedure: SKIN GRAFT SPLIT THICKNESS FROM LEFT THIGH TO LEFT UPPER ARM;  Surgeon: Elisabeth Craig RAMAN, MD;  Location: [facility] OR;  Service: Plastics;  Laterality: Left;   Social History    Socioeconomic History   Marital status: Single    Spouse name: Not on file   Number of children: Not on file   Years of education: Not on file   Highest education level: Not on file  Occupational History   Not on file  Tobacco Use   Smoking status: Every Day    Types: Cigarettes   Smokeless tobacco: Never  Vaping Use   Vaping status: Never Used  Substance and Sexual Activity   Alcohol use: Yes    Alcohol/week: 0.0 standard drinks of alcohol    Comment: occasional   Drug use: Not Currently    Comment: patient states clean since 03/29/14   Sexual activity: Not Currently  Other Topics Concern   Not on file  Social History Narrative   Not on file   Social Drivers of Health   Financial Resource Strain: Not on file  Food Insecurity: No Food Insecurity (10/25/2023)   Hunger Vital Sign    Worried About Running Out of Food in the Last Year: Never true    Ran Out of Food in the Last Year: Never true  Transportation Needs: Unmet Transportation Needs (10/25/2023)   PRAPARE - Administrator, Civil Service (Medical): Yes    Lack of Transportation (Non-Medical): Yes  Physical Activity: Not on file  Stress: Not on file  Social Connections: Not on file   Family  History  Problem Relation Age of Onset   Bipolar disorder Mother    Schizophrenia Sister    No Known Allergies Prior to Admission medications   Medication Sig Start Date End Date Taking? Authorizing Provider  glucose blood (GLUCOSE METER TEST) test strip Use as instructed 07/14/20   Lue Elsie BROCKS, MD  hydrALAZINE (APRESOLINE) 25 MG tablet Take 1 tablet (25 mg total) by mouth 3 (three) times daily. 10/28/23 11/27/23  Leotis Bogus, MD  Insulin  Glargine (BASAGLAR  KWIKPEN) 100 UNIT/ML Inject 40 Units into the skin at bedtime. 10/29/23 11/28/23  Fausto Sor A, DO  insulin  lispro (HUMALOG  KWIKPEN) 100 UNIT/ML KwikPen Inject 4-30 Units into the skin 3 (three) times daily before meals. 10/29/23   Fausto Sor LABOR, DO  Insulin  Pen Needle (MM PEN NEEDLES) 32G X 4 MM MISC To be used with insulin  as instructed 10/29/23   Fausto Sor A, DO  methadone  (DOLOPHINE ) 10 MG/5ML solution Take 150 mg by mouth daily at 12 noon.    [provider]  insulin  aspart (NOVOLOG ) 100 UNIT/ML injection Inject 6 Units into the skin 3 (three) times daily with meals. 02/22/12 02/28/12  Abrol, Nayana, MD  Omeprazole  20 MG TBEC Take 1 tablet (20 mg total) by mouth daily. 02/28/12 03/04/12  Vicci Afton CROME, MD   DG Wrist Complete Right Result Date: 11/30/2023 EXAM: 3 OR MORE VIEW(S) XRAY OF THE RIGHT WRIST 11/30/2023 08:11:00 AM COMPARISON: Right wrist series dated 10/24/2023. CLINICAL HISTORY: abscess abscess FINDINGS: BONES AND JOINTS: No acute fracture. No focal osseous lesion. No joint dislocation. No foreign bodies are evident. SOFT TISSUES: There is moderate diffuse soft tissue swelling. IMPRESSION: 1. Moderate diffuse soft tissue swelling, possibly related to the reported abscess. No foreign bodies identified. 2. No acute osseous abnormality. Electronically signed by: Evalene Coho MD 11/30/2023 08:43 AM EST RP Workstation: [id number]    Positive ROS: All other systems have been reviewed and were otherwise negative with the exception of those mentioned in the HPI and as above.  Physical Exam: General: Alert, no acute distress Cardiovascular: No pedal edema Respiratory: No cyanosis, no use of accessory musculature GI: No organomegaly, abdomen is soft and non-tender Skin: No lesions in the area of chief complaint Neurologic: Sensation intact distally Psychiatric: Patient is competent for consent with normal mood and affect Lymphatic: No axillary or cervical lymphadenopathy  MUSCULOSKELETAL:  Right upper extremity - skin with purulent drainage - tenderness and fluctuance of the volar wrist abscess measuring 1 by 4 cm - Painful ROM of the wrist - intact motor m/r/u nerve distributions - Sensation intact to  light touch m/r/u nerve distributions - 2+ radial pulse  Imaging:  X-rays: right wrist 3 views  were obtained and reviewed.  My independent interpretation is as follows: evidence of soft tissue swelling volarly without underlying changes to the osseous structures of the wrist.  CT of the right forearm was obtained and reviewed. My independent interpretation is as follows: Area of volar fluid collection measuring approximately 1 x 1 x 4 cm with associated induration localized within the deep tissues of the volar wrist.  Assessment: Right volar wrist abscess recalcitrant to I&D and IV antibiotics IVDU DM, uncontrolled with last A1C 104  Plan: 33 year old female with 1 month history of abscess and volar right wrist infection recalcitrant to bedside I&D x 2 and PO antibiotics now presenting with worsening pain, drainage and evidence of abscess on imaging and exam.  The patient is indicated to undergo a formal I&D  of the right wrist to decompress the abscess and then be admitted for IV antibiotics. We will hold additional abx for cultures to be obtained intra operatively and then plan for serial exam and ESR and CRP to be drawn q48 hours to evaluate for improvement. Infectious disease consult will also be placed post operatively. Informed consent was obtained from the patient including discussion of risks with included, but are not limited to persistent infection, need for additional procedures, damage to surrounding structures such as damage to nerves, arteries and veins and disability of the extremity.  The patient understands that her infection is unlikely to clear with surgical intervention and that her drug use and uncontrolled diabetes are significant risk factors for worsening infection.   Keep NPO Plan to proceed with I&D right wrist with admission for IV abx Medical clearance per primary Tight glucose control ID consult IV vanc and zosyn  after cultures obtained Pre-op labs with baseline  ESR and CRP obtained Dispo: pending improvement in exam s/p OR    Rankin LELON Pizza, MD    11/30/2023 9:35 AM

## 2023-11-30 NOTE — Progress Notes (Signed)
 Dr. Leonce aware of elevated CBG and orders received for insulin  dose.  Dr. Leonce also aware of patient eating jolly ranchers and gummies at 0800 today.

## 2023-11-30 NOTE — Progress Notes (Signed)
 ED Pharmacy Antibiotic Sign Off An antibiotic consult was received from an ED provider for Vancomycin   per pharmacy dosing for cellulitis. A chart review was completed to assess appropriateness.   The following one time order(s) were placed:  Vancomycin  1500 mg IV   Further antibiotic and/or antibiotic pharmacy consults should be ordered by the admitting provider if indicated.    Dail Cordella Misty, [facility]  Clinical Pharmacist 11/30/23 8:10 AM

## 2023-11-30 NOTE — ED Notes (Signed)
 Patient transported to CT

## 2023-11-30 NOTE — ED Notes (Signed)
 EDPA attempting IVUS. Pt advised she can not leave the room to go smoke with IV in place.

## 2023-11-30 NOTE — Anesthesia Procedure Notes (Signed)
 Procedure Name: Intubation Date/Time: 11/30/2023 12:18 PM  Performed by: Jadah Bobak J, CRNAPre-anesthesia Checklist: Patient identified, Emergency Drugs available, Suction available and Patient being monitored Patient Re-evaluated:Patient Re-evaluated prior to induction Oxygen Delivery Method: Circle System Utilized Preoxygenation: Pre-oxygenation with 100% oxygen Induction Type: IV induction, Rapid sequence and Cricoid Pressure applied Laryngoscope Size: Miller and 3 Grade View: Grade I Tube type: Oral Tube size: 7.0 mm Number of attempts: 1 Airway Equipment and Method: Stylet and Oral airway Placement Confirmation: ETT inserted through vocal cords under direct vision, positive ETCO2 and breath sounds checked- equal and bilateral Secured at: 21 cm Tube secured with: Tape Dental Injury: Teeth and Oropharynx as per pre-operative assessment

## 2023-11-30 NOTE — H&P (Signed)
 History and Physical    Patient: Anna Dennis FMW:[id number] DOB: 1990/12/24 DOA: 11/30/2023 DOS: the patient was seen and examined on 11/30/2023 PCP: Oley Bascom RAMAN, NP  Patient coming from: Home  Chief Complaint:  Chief Complaint  Patient presents with   Abscess   HPI: Anna Dennis is a 33 y.o. 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  Pt presented with pain in the R wrist. Per her report, the pain has been present since she initially presented with this issue on 08/28/2023 and went for emergent debridement; pt was eventually discharged from the hospital on cefdinir /flagyl  x14 days per ID and entered a rehab facility and underwent 2 rounds of attempted I&D followed by oral antibiotics only to have increasing pain since being discharge from rehab and finishing a recent abx course; as such, pt presented to the ED.   In the ED, pt AFVSS. Labs notable for Na 130, and glucose 600 (after eating Jolly Ranchers all night per EDP). XR R wrist showed moderate diffuse soft tissue swelling, possibly related to the reported abscess, no foreign bodies, and no acute osseous abnormality. EDP consulted hand surgery and requested medicine admission.   Review of Systems: As mentioned in the history of present illness. All other systems reviewed and are negative. Past Medical History:  Diagnosis Date   Depression    Diabetes mellitus    Insulin  pump   GERD (gastroesophageal reflux disease)    Hepatitis C    Heroin abuse (HCC)    Iron deficiency anemia    IVDU (intravenous drug user)    OCD (obsessive compulsive disorder)    Past Surgical History:  Procedure Laterality Date   I & D EXTREMITY Left 07/11/2020   Procedure: IRRIGATION AND DEBRIDEMENT EXTREMITY;  Surgeon: Elsa Lonni SAUNDERS, MD;  Location: [facility] OR;  Service: Orthopedics;  Laterality: Left;   I & D  EXTREMITY Left 08/27/2020   Procedure: IRRIGATION AND DEBRIDEMENT HAND;  Surgeon: Elisabeth Craig RAMAN, MD;  Location: [facility] OR;  Service: Plastics;  Laterality: Left;   SKIN SPLIT GRAFT Left 04/16/2021   Procedure: SKIN GRAFT SPLIT THICKNESS FROM LEFT THIGH TO LEFT UPPER ARM;  Surgeon: Elisabeth Craig RAMAN, MD;  Location: [facility] OR;  Service: Plastics;  Laterality: Left;   Social History:  reports that she has been smoking cigarettes. She has never used smokeless tobacco. She reports current alcohol use. She reports that she does not currently use drugs.  No Known Allergies  Family History  Problem Relation Age of Onset   Bipolar disorder Mother    Schizophrenia Sister     Prior to Admission medications   Medication Sig Start Date End Date Taking? Authorizing Provider  glucose blood (GLUCOSE METER TEST) test strip Use as instructed 07/14/20   Lue Elsie BROCKS, MD  hydrALAZINE (APRESOLINE) 25 MG tablet Take 1 tablet (25 mg total) by mouth 3 (three) times daily. 10/28/23 11/27/23  Leotis Bogus, MD  Insulin  Glargine (BASAGLAR  KWIKPEN) 100 UNIT/ML Inject 40 Units into the skin at bedtime. 10/29/23 11/28/23  Fausto Sor A, DO  insulin  lispro (HUMALOG  KWIKPEN) 100 UNIT/ML KwikPen Inject 4-30 Units into the skin 3 (three) times daily before meals. 10/29/23   Fausto Sor LABOR, DO  Insulin  Pen Needle (MM PEN NEEDLES) 32G X 4 MM MISC To be used with insulin  as instructed 10/29/23   Fausto Sor A, DO  methadone  (DOLOPHINE ) 10  MG/5ML solution Take 150 mg by mouth daily at 12 noon.    [provider]  insulin  aspart (NOVOLOG ) 100 UNIT/ML injection Inject 6 Units into the skin 3 (three) times daily with meals. 02/22/12 02/28/12  Abrol, Nayana, MD  Omeprazole  20 MG TBEC Take 1 tablet (20 mg total) by mouth daily. 02/28/12 03/04/12  Vicci Afton CROME, MD    Physical Exam: Vitals:   11/30/23 0331 11/30/23 0338 11/30/23 0836  BP: 122/71  127/86  Pulse: (!) 109  88  Resp: 18  17  Temp: 99.6 F (37.6 C)   97.6 F (36.4 C)  TempSrc: Oral  Oral  SpO2: 99%  100%  Weight:  72.6 kg   Height:  5' 7 (1.702 m)    General: Alert, oriented x3, resting comfortably in no acute distress Respiratory: Lungs clear to auscultation bilaterally with normal respiratory effort; no w/r/r Cardiovascular: Regular rate and rhythm w/o m/r/g MSK: R wrist draining abscess    Data Reviewed:  Lab Results  Component Value Date   WBC 6.7 11/30/2023   HGB 11.6 (L) 11/30/2023   HCT 35.3 (L) 11/30/2023   MCV 89.4 11/30/2023   PLT 284 11/30/2023   Lab Results  Component Value Date   GLUCOSE 606 (HH) 11/30/2023   CALCIUM 8.3 (L) 11/30/2023   NA 130 (L) 11/30/2023   K 4.3 11/30/2023   CO2 25 11/30/2023   CL 94 (L) 11/30/2023   BUN 17 11/30/2023   CREATININE 0.91 11/30/2023   Lab Results  Component Value Date   ALT 58 (H) 10/24/2023   AST 39 10/24/2023   ALKPHOS 152 (H) 10/24/2023   BILITOT 0.6 10/24/2023   Lab Results  Component Value Date   INR 1.4 (H) 07/11/2020   INR 0.95 07/05/2014   Radiology: DG Wrist Complete Right Result Date: 11/30/2023 EXAM: 3 OR MORE VIEW(S) XRAY OF THE RIGHT WRIST 11/30/2023 08:11:00 AM COMPARISON: Right wrist series dated 10/24/2023. CLINICAL HISTORY: abscess abscess FINDINGS: BONES AND JOINTS: No acute fracture. No focal osseous lesion. No joint dislocation. No foreign bodies are evident. SOFT TISSUES: There is moderate diffuse soft tissue swelling. IMPRESSION: 1. Moderate diffuse soft tissue swelling, possibly related to the reported abscess. No foreign bodies identified. 2. No acute osseous abnormality. Electronically signed by: Evalene Coho MD 11/30/2023 08:43 AM EST RP Workstation: [id number]    Assessment and Plan: 57F h/o DM1 (on insulin  pump), IVDU (heroine), GERD, IDA, and OCD/depression p/w open/draining R wrist abscess at site of IV drug injection.  R wrist abscess -Hand surgery consulted; apprec eval/recs -Defer IV abx until operative wound cultures  obtained, then start IV vanc/cefepime per pharmacy protocol as well as PO metronidazole  500mg  TID -Multimodal pain control PO tylenol  TID, robaxin TID, and gabapentin TID following surgery -F/u operative cultures and consider ID consult  DM1 -Insulin  Pump protocol ordered -CBG q4h while NPO  H/o IVDU -Methadone  150mg  daily   Advance Care Planning:   Code Status: Full Code   Consults: Hand surgery  Family Communication: N/A  Severity of Illness: The appropriate patient status for this patient is INPATIENT. Inpatient status is judged to be reasonable and necessary in order to provide the required intensity of service to ensure the patient's safety. The patient's presenting symptoms, physical exam findings, and initial radiographic and laboratory data in the context of their chronic comorbidities is felt to place them at high risk for further clinical deterioration. Furthermore, it is not anticipated that the patient will be medically stable for discharge  from the hospital within 2 midnights of admission.   * I certify that at the point of admission it is my clinical judgment that the patient will require inpatient hospital care spanning beyond 2 midnights from the point of admission due to high intensity of service, high risk for further deterioration and high frequency of surveillance required.*   ------- I spent 55 minutes reviewing previous notes, at the bedside counseling/discussing the treatment plan, and performing clinical documentation.  Author: Marsha Ada, MD 11/30/2023 10:15 AM  For on call review [email or web address].

## 2023-11-30 NOTE — Op Note (Signed)
 DATE OF SURGERY: 11/30/2023  PREOPERATIVE DIAGNOSES:  1. .Right volar wrist abscess, multi loculated   POSTOPERATIVE DIAGNOSES:  1. The same  PROCEDURES:  1. Right wrist complex I&D of volar forearm abscess of the deep tissues  SURGEON: Surgeons and Role:    * Wonder Donaway, Rankin ORN, MD - Primary  ANESTHESIA: General  TOURNIQUET TIME: None  BLOOD LOSS: Minimal.   COMPLICATIONS: None.   PATHOLOGY: Aerobic and anaerobic cultures sent x 2  FINDINGS: large multi loculated abscess measuring approximately 1 x 1 x 4 cm in dimension surrounding the volar compartment tendons  TIME OUT: A time out was performed before the procedure started.   INDICATIONS: The patient is a 33 y.o. female who presented with a right volar forearm abscess indicated for surgery due to failure of response to multiple courses of oral antibiotics and 2 bedside I&D's at an outside institution with evidence of draining abscess both on exam and CT of the right forearm.  The patient was seen in the preop holding room, the operative site, consent form and indications were reviewed with the patient.  The extremity was examined and found to have 1 x 4 cm area of induration and fluctuance with expressible purulence from the wound along with intact motor and intact sensation to light touch to median, radial, and ulnar nerve distributions as well as 2+ radial pulse. Opportunity was provided the patient for final questions and all questions were answered to their satisfaction. The operative extremity was marked by me.    DESCRIPTION OF PROCEDURE: The patient was brought back to the operating room and transferred to the OR table where they underwent general anesthesia per anesthesia protocol. The patient was placed in the supine position on a hand table. Prophylactic antibiotics were held until cultures were obtained  A well padded tourniquet was inflated.   The extremity was prepped and draped in standard sterile fashion.  A time out was  performed identifying the patient, procedure, extremity with laterality, antibiotics given, and all present agreed.   A longitudinal incision was made over the volar abscess with the sinus tract being excised. A large volume of purulence was encountered and cultures were taken at this time and antibiotics were initiated.  The abscess was debrided with a combination of blunt and sharp dissection/ debridement. It was found to extend into the deep tissues around the volar forearm tendons. Once satisfied with the debridement, the wound was irrigated with 6 liters of NS.  The wound was then packed with 1/4 inch iodoform and dressed with xeroform, 4x4, abd, webril a volar slab splint and an ace wrap. The patient was awakened per anesthesia protocol and transferred to a hospital bed. The patient was transferred to recovery room in stable condition after all counts were correct. There were no complications.  POSTOPERATIVE PLAN: The patient will require serial examinations with admittance to the hospital for IV antibiotics.  ID consult will be placed and the patient started on IV vanc and zosyn .  She will have her dressings taken down in 48 hours and serial ESR and CRP ordered. The patient is to have strict glucose control to assist with her wound healing and clearance of infection

## 2023-11-30 NOTE — ED Triage Notes (Signed)
 PT arrived POV from home c/o having an abscess to the right wrist. Pt states it was drained and she just finished antibiotics last night but wants it looked at

## 2023-11-30 NOTE — ED Notes (Signed)
 Patient transported to X-ray

## 2023-12-01 ENCOUNTER — Encounter (HOSPITAL_COMMUNITY): Payer: Self-pay

## 2023-12-01 DIAGNOSIS — L02413 Cutaneous abscess of right upper limb: Secondary | ICD-10-CM | POA: Diagnosis not present

## 2023-12-01 LAB — BASIC METABOLIC PANEL WITH GFR
Anion gap: 10 (ref 5–15)
BUN: 10 mg/dL (ref 6–20)
CO2: 25 mmol/L (ref 22–32)
Calcium: 7.9 mg/dL — ABNORMAL LOW (ref 8.9–10.3)
Chloride: 100 mmol/L (ref 98–111)
Creatinine, Ser: 0.74 mg/dL (ref 0.44–1.00)
GFR, Estimated: >60 mL/min (ref >60)
Glucose, Bld: 221 mg/dL — ABNORMAL HIGH (ref 70–99)
Potassium: 3.8 mmol/L (ref 3.5–5.1)
Sodium: 135 mmol/L (ref 135–145)

## 2023-12-01 LAB — GLUCOSE, CAPILLARY
Glucose-Capillary: 122 mg/dL — ABNORMAL HIGH (ref 70–99)
Glucose-Capillary: 147 mg/dL — ABNORMAL HIGH (ref 70–99)
Glucose-Capillary: 170 mg/dL — ABNORMAL HIGH (ref 70–99)
Glucose-Capillary: 257 mg/dL — ABNORMAL HIGH (ref 70–99)
Glucose-Capillary: 283 mg/dL — ABNORMAL HIGH (ref 70–99)
Glucose-Capillary: 304 mg/dL — ABNORMAL HIGH (ref 70–99)
Glucose-Capillary: 71 mg/dL (ref 70–99)

## 2023-12-01 LAB — CBC WITH DIFFERENTIAL/PLATELET
Abs Immature Granulocytes: 0.03 K/uL (ref 0.00–0.07)
Basophils Absolute: 0 K/uL (ref 0.0–0.1)
Basophils Relative: 0 %
Eosinophils Absolute: 0.2 K/uL (ref 0.0–0.5)
Eosinophils Relative: 3 %
HCT: 35.2 % — ABNORMAL LOW (ref 36.0–46.0)
Hemoglobin: 11.9 g/dL — ABNORMAL LOW (ref 12.0–15.0)
Immature Granulocytes: 0 %
Lymphocytes Relative: 23 %
Lymphs Abs: 1.7 K/uL (ref 0.7–4.0)
MCH: 30.1 pg (ref 26.0–34.0)
MCHC: 33.8 g/dL (ref 30.0–36.0)
MCV: 89.1 fL (ref 80.0–100.0)
Monocytes Absolute: 0.4 K/uL (ref 0.1–1.0)
Monocytes Relative: 5 %
Neutro Abs: 5 K/uL (ref 1.7–7.7)
Neutrophils Relative %: 69 %
Platelets: 281 K/uL (ref 150–400)
RBC: 3.95 MIL/uL (ref 3.87–5.11)
RDW: 12.5 % (ref 11.5–15.5)
WBC: 7.4 K/uL (ref 4.0–10.5)
nRBC: 0 % (ref 0.0–0.2)

## 2023-12-01 MED ORDER — INSULIN ASPART 100 UNIT/ML IJ SOLN: 0.0000 [IU] | Freq: Every day | INTRAMUSCULAR | Status: DC

## 2023-12-01 MED ORDER — ESCITALOPRAM OXALATE 20 MG PO TABS
20.0000 mg | ORAL_TABLET | Freq: Every day | ORAL | Status: DC
  Administered 2023-12-01 – 2023-12-04 (×4): 20 mg via ORAL
  Filled 2023-12-01 (×4): qty 1

## 2023-12-01 MED ORDER — INSULIN ASPART 100 UNIT/ML IJ SOLN: 0.0000 [IU] | Freq: Three times a day (TID) | INTRAMUSCULAR | Status: DC

## 2023-12-01 MED ORDER — SODIUM CHLORIDE 0.9 % IV SOLN
2.0000 g | INTRAVENOUS | Status: DC
  Administered 2023-12-01 – 2023-12-02 (×2): 2 g via INTRAVENOUS
  Filled 2023-12-01 (×2): qty 20

## 2023-12-01 MED ORDER — METHADONE HCL 10 MG PO TABS
70.0000 mg | ORAL_TABLET | Freq: Every morning | ORAL | Status: DC
Start: 2023-12-01 — End: 2023-12-05
  Administered 2023-12-01 – 2023-12-05 (×5): 70 mg via ORAL
  Filled 2023-12-01 (×5): qty 7

## 2023-12-01 MED ORDER — GUAIFENESIN 100 MG/5ML PO LIQD: 5.0000 mL | ORAL | Status: DC | PRN

## 2023-12-01 MED ORDER — ONDANSETRON HCL 4 MG/2ML IJ SOLN: 4.0000 mg | Freq: Four times a day (QID) | INTRAMUSCULAR | Status: DC | PRN

## 2023-12-01 MED ORDER — METOPROLOL TARTRATE 5 MG/5ML IV SOLN: 5.0000 mg | INTRAVENOUS | Status: DC | PRN

## 2023-12-01 MED ORDER — HYDRALAZINE HCL 20 MG/ML IJ SOLN: 10.0000 mg | INTRAMUSCULAR | Status: DC | PRN

## 2023-12-01 MED ORDER — GLUCAGON HCL RDNA (DIAGNOSTIC) 1 MG IJ SOLR: 1.0000 mg | INTRAMUSCULAR | Status: DC | PRN

## 2023-12-01 MED ORDER — INSULIN ASPART 100 UNIT/ML IJ SOLN
3.0000 [IU] | Freq: Three times a day (TID) | INTRAMUSCULAR | Status: DC
  Administered 2023-12-01 – 2023-12-02 (×3): 3 [IU] via SUBCUTANEOUS

## 2023-12-01 MED ORDER — IPRATROPIUM-ALBUTEROL 0.5-2.5 (3) MG/3ML IN SOLN: 3.0000 mL | RESPIRATORY_TRACT | Status: DC | PRN

## 2023-12-01 MED ORDER — INSULIN ASPART 100 UNIT/ML IJ SOLN
0.0000 [IU] | Freq: Three times a day (TID) | INTRAMUSCULAR | Status: DC
  Administered 2023-12-01: 2 [IU] via SUBCUTANEOUS
  Administered 2023-12-01: 7 [IU] via SUBCUTANEOUS
  Administered 2023-12-02 (×2): 3 [IU] via SUBCUTANEOUS
  Administered 2023-12-02 – 2023-12-03 (×3): 9 [IU] via SUBCUTANEOUS
  Administered 2023-12-04: 1 [IU] via SUBCUTANEOUS
  Administered 2023-12-04: 3 [IU] via SUBCUTANEOUS
  Administered 2023-12-04: 9 [IU] via SUBCUTANEOUS
  Filled 2023-12-01 (×2): qty 3
  Filled 2023-12-01: qty 1
  Filled 2023-12-01: qty 9
  Filled 2023-12-01: qty 1
  Filled 2023-12-01 (×2): qty 9

## 2023-12-01 MED ORDER — NICOTINE 7 MG/24HR TD PT24
7.0000 mg | MEDICATED_PATCH | Freq: Every day | TRANSDERMAL | Status: AC
  Administered 2023-12-01 – 2023-12-02 (×2): 7 mg via TRANSDERMAL
  Filled 2023-12-01 (×2): qty 1

## 2023-12-01 MED ORDER — INSULIN ASPART 100 UNIT/ML IJ SOLN: 3.0000 [IU] | Freq: Three times a day (TID) | INTRAMUSCULAR | Status: DC

## 2023-12-01 MED ORDER — METHADONE HCL 10 MG PO TABS
40.0000 mg | ORAL_TABLET | Freq: Every evening | ORAL | Status: DC
  Administered 2023-12-01 – 2023-12-04 (×4): 40 mg via ORAL
  Filled 2023-12-01 (×4): qty 4

## 2023-12-01 MED ORDER — INSULIN GLARGINE-YFGN 100 UNIT/ML ~~LOC~~ SOLN
30.0000 [IU] | Freq: Every day | SUBCUTANEOUS | Status: DC
  Administered 2023-12-01: 30 [IU] via SUBCUTANEOUS
  Filled 2023-12-01 (×2): qty 0.3

## 2023-12-01 NOTE — TOC Initial Note (Signed)
 Transition of Care [facility]) - Initial/Assessment Note    Patient Details  Name: Anna Dennis MRN: [id number] Date of Birth: 12/08/1990  Transition of Care [facility]) CM/SW Contact:    Jeoffrey LITTIE Moose, [place] Phone Number: 12/01/2023, 12:49 PM  Clinical Narrative:                 Pt admitted from home due to right wrist abscess. CSW spoke with pt regarding SDOH needs and offered resources. Pt accepted and CSW left community resource packet at bedside.   Expected Discharge Plan: Home/Self Care Barriers to Discharge: Continued Medical Work up   Patient Goals and CMS Choice Patient states their goals for this hospitalization and ongoing recovery are:: return home          Expected Discharge Plan and Services       Living arrangements for the past 2 months: Single Family Home                                      Prior Living Arrangements/Services Living arrangements for the past 2 months: Single Family Home Lives with:: Self Patient language and need for interpreter reviewed:: Yes Do you feel safe going back to the place where you live?: Yes      Need for Family Participation in Patient Care: No (Comment) Care giver support system in place?: Yes (comment)   Criminal Activity/Legal Involvement Pertinent to Current Situation/Hospitalization: No - Comment as needed  Activities of Daily Living   ADL Screening (condition at time of admission) Independently performs ADLs?: Yes (appropriate for developmental age) Is the patient deaf or have difficulty hearing?: No Does the patient have difficulty seeing, even when wearing glasses/contacts?: No Does the patient have difficulty concentrating, remembering, or making decisions?: No  Permission Sought/Granted Permission sought to share information with : Family Supports    Share Information with NAME: Hart     Permission granted to share info w Relationship: Mother  Permission granted to share info w Contact Information:  [phone] [id number]  Emotional Assessment Appearance:: Appears stated age Attitude/Demeanor/Rapport: Engaged Affect (typically observed): Accepting Orientation: : Oriented to Self, Oriented to Place, Oriented to  Time, Oriented to Situation Alcohol / Substance Use: Not Applicable Psych Involvement: No (comment)  Admission diagnosis:  Abscess [L02.91] Abscess of skin of right wrist [L02.413] Patient Active Problem List   Diagnosis Date Noted   Abscess of skin of right wrist 11/30/2023   Cellulitis of right hand 10/25/2023   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   Polysubstance abuse (HCC) 10/25/2023   Cellulitis of left upper extremity    Uncontrolled type 1 diabetes mellitus with hyperglycemia, with long-term current use of insulin  (HCC) 08/27/2020   Sepsis (HCC) 08/27/2020   Cellulitis of arm, left 08/27/2020   Necrotizing fasciitis (HCC) 07/11/2020   Hyperkalemia 09/05/2015   Syncope 09/05/2015   AKI (acute kidney injury)    Heroin abuse (HCC)    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 [id number] - , [place] - [address] AT [facility] [address] [place] [address] Phone: [phone] Fax: [phone]  [place] [facility] [address], [address] [place] [address] Phone: [phone] Fax: [phone]  [facility] #[id number] [place], [place] - [address] AT [facility] [address] & [facility] [address] [place] [address] Phone: [phone] Fax:  [phone]     Social Drivers of Health (SDOH) Social History: SDOH Screenings   Food Insecurity: Food Insecurity Present (12/01/2023)  Housing: High Risk (12/01/2023)  Transportation Needs: Unmet Transportation Needs (12/01/2023)  Utilities: Not At Risk (12/01/2023)  Depression (PHQ2-9): High Risk (05/02/2023)  Tobacco Use: High Risk (11/30/2023)   SDOH Interventions:     Readmission Risk Interventions     No data to display

## 2023-12-01 NOTE — Progress Notes (Signed)
 PROGRESS NOTE    Anna Dennis  FMW:[id number] DOB: [birth year] DOA: 11/30/2023 PCP: Oley Bascom RAMAN, NP    Brief Narrative:  33 y.o. 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  Upon admission noted to have right wrist complex abscess requiring I&D on 11/2.  Hospital course also complicated by hyperglycemia.   Assessment & Plan:   R wrist abscess -This is in the setting of IV drug abuse.  Status post I&D by orthopedic on 11/2.  MRI of the right wrist reviewed.  Continue IV vancomycin , Zosyn .  Consulted ID for further input.  Will follow culture data.   DM1, uncontrolled due to hyperglycemia -Currently on insulin  pump.  Accu-Cheks.  Consult diabetic coordinator - A1c 10.6   H/o IVDU -Methadone  150mg  daily.  Counseled to quit using this  OCD/depression - Reports she is on daily Lexapro  DVT prophylaxis: enoxaparin  (LOVENOX ) injection 40 mg Start: 12/01/23 1000 SCDs Start: 11/30/23 1015      Code Status: Full Code Family Communication:   Status is: Inpatient Remains inpatient appropriate because: Continue hospital stay   PT Follow up Recs:   Subjective:  Seen at bedside does not any complaints at this time besides right-sided surgical site pain Reports she is also on daily Lexapro 20 milligrams.  Examination:  General exam: Appears calm and comfortable  Respiratory system: Clear to auscultation. Respiratory effort normal. Cardiovascular system: S1 & S2 heard, RRR. No JVD, murmurs, rubs, gallops or clicks. No pedal edema. Gastrointestinal system: Abdomen is nondistended, soft and nontender. No organomegaly or masses felt. Normal bowel sounds heard. Central nervous system: Alert and oriented. No focal neurological deficits. Extremities: Symmetric 5 x 5 power. Skin: No rashes, lesions or ulcers Psychiatry: Judgement and insight appear  normal. Mood & affect appropriate.                Diet Orders (From admission, onward)     Start     Ordered   11/30/23 1648  Diet Carb Modified Fluid consistency: Thin; Room service appropriate? Yes  Diet effective now       Question Answer Comment  Diet-HS Snack? Nothing   Calorie Level Medium 1600-2000   Fluid consistency: Thin   Room service appropriate? Yes      11/30/23 1648            Objective: Vitals:   11/30/23 2135 12/01/23 0226 12/01/23 0635 12/01/23 1119  BP: (!) 151/100 (!) 100/57 119/78 120/78  Pulse: 70 75 75 80  Resp: 18 16 17 18   Temp: 98 F (36.7 C) 98.1 F (36.7 C) 98 F (36.7 C) 98.6 F (37 C)  TempSrc: Oral Oral Oral Oral  SpO2: 100% 99% 96% 97%  Weight:      Height:        Intake/Output Summary (Last 24 hours) at 12/01/2023 1225 Last data filed at 12/01/2023 1118 Gross per 24 hour  Intake 840 ml  Output 5 ml  Net 835 ml   Filed Weights   11/30/23 0338 11/30/23 1129  Weight: 72.6 kg 72.6 kg    Scheduled Meds:  acetaminophen   1,000 mg Oral TID   enoxaparin  (LOVENOX ) injection  40 mg Subcutaneous Q24H   gabapentin  300 mg Oral TID   insulin  aspart  0-5 Units Subcutaneous QHS   insulin  aspart  0-9 Units Subcutaneous TID WC   insulin  aspart  3 Units Subcutaneous  TID WC   insulin  glargine-yfgn  30 Units Subcutaneous Daily   methadone   70 mg Oral q AM   And   methadone   40 mg Oral QPM   methocarbamol  500 mg Oral TID   Continuous Infusions:  cefTRIAXone  (ROCEPHIN )  IV      Nutritional status     Body mass index is 25.06 kg/m.  Data Reviewed:   CBC: Recent Labs  Lab 11/30/23 0743 12/01/23 0403  WBC 6.7 7.4  NEUTROABS 4.3 5.0  HGB 11.6* 11.9*  HCT 35.3* 35.2*  MCV 89.4 89.1  PLT 284 281   Basic Metabolic Panel: Recent Labs  Lab 11/30/23 0743 12/01/23 0626  NA 130* 135  K 4.3 3.8  CL 94* 100  CO2 25 25  GLUCOSE 606* 221*  BUN 17 10  CREATININE 0.91 0.74  CALCIUM 8.3* 7.9*   GFR: Estimated  Creatinine Clearance: 96.4 mL/min (by C-G formula based on SCr of 0.74 mg/dL). Liver Function Tests: No results for input(s): AST, ALT, ALKPHOS, BILITOT, PROT, ALBUMIN in the last 168 hours. No results for input(s): LIPASE, AMYLASE in the last 168 hours. No results for input(s): AMMONIA in the last 168 hours. Coagulation Profile: No results for input(s): INR, PROTIME in the last 168 hours. Cardiac Enzymes: No results for input(s): CKTOTAL, CKMB, CKMBINDEX, TROPONINI in the last 168 hours. BNP (last 3 results) No results for input(s): PROBNP in the last 8760 hours. HbA1C: Recent Labs    11/30/23 1014  HGBA1C 10.6*   CBG: Recent Labs  Lab 11/30/23 2034 12/01/23 0021 12/01/23 0407 12/01/23 0756 12/01/23 1208  GLUCAP 113* 122* 147* 257* 304*   Lipid Profile: No results for input(s): CHOL, HDL, LDLCALC, TRIG, CHOLHDL, LDLDIRECT in the last 72 hours. Thyroid Function Tests: No results for input(s): TSH, T4TOTAL, FREET4, T3FREE, THYROIDAB in the last 72 hours. Anemia Panel: No results for input(s): VITAMINB12, FOLATE, FERRITIN, TIBC, IRON, RETICCTPCT in the last 72 hours. Sepsis Labs: No results for input(s): PROCALCITON, LATICACIDVEN in the last 168 hours.  Recent Results (from the past 240 hours)  Blood culture (routine x 2)     Status: None (Preliminary result)   Collection Time: 11/30/23  7:43 AM   Specimen: BLOOD RIGHT FOREARM  Result Value Ref Range Status   Specimen Description BLOOD RIGHT FOREARM  Final   Special Requests   Final    BOTTLES DRAWN AEROBIC AND ANAEROBIC Blood Culture results may not be optimal due to an inadequate volume of blood received in culture bottles   Culture   Final    NO GROWTH < 24 HOURS Performed at [facility] Lab, [address], [place], [place] [address]    Report Status PENDING  Incomplete  Aerobic Culture w Gram Stain (superficial specimen)     Status: None  (Preliminary result)   Collection Time: 11/30/23  8:55 AM   Specimen: Wound  Result Value Ref Range Status   Specimen Description WOUND  Final   Special Requests NONE  Final   Gram Stain RARE WBC SEEN MODERATE GRAM POSITIVE COCCI   Final   Culture   Final    MODERATE STREPTOCOCCUS CONSTELLATUS SUSCEPTIBILITIES TO FOLLOW Performed at [facility], [address], [place], [place] [address]    Report Status PENDING  Incomplete  Aerobic/Anaerobic Culture w Gram Stain (surgical/deep wound)     Status: None (Preliminary result)   Collection Time: 11/30/23 12:38 PM   Specimen: Path fluid; Body Fluid  Result Value Ref  Range Status   Specimen Description FLUID  Final   Special Requests RIGHT FOREARM ABSCESS  Final   Gram Stain   Final    FEW WBC PRESENT, PREDOMINANTLY PMN MODERATE GRAM POSITIVE COCCI Performed at [facility], [address], [place], [place] [address]    Culture MODERATE STREPTOCOCCUS CONSTELLATUS  Final   Report Status PENDING  Incomplete  Blood culture (routine x 2)     Status: None (Preliminary result)   Collection Time: 11/30/23  4:37 PM   Specimen: BLOOD LEFT HAND  Result Value Ref Range Status   Specimen Description BLOOD LEFT HAND  Final   Special Requests   Final    BOTTLES DRAWN AEROBIC ONLY Blood Culture results may not be optimal due to an inadequate volume of blood received in culture bottles   Culture   Final    NO GROWTH < 24 HOURS Performed at [facility], [address] [place], [place] [address]    Report Status PENDING  Incomplete         Radiology Studies: MR WRIST RIGHT W WO CONTRAST Result Date: 12/01/2023 EXAM: MRI OF THE RIGHT WRIST WITH AND WITHOUT INTRAVENOUS CONTRAST 11/30/2023 10:29:00 PM TECHNIQUE: Multiplanar multisequence MRI of the right wrist was performed with and without the administration of intravenous contrast. 7 mL gadobutrol (GADAVIST) 1 MMOL/ML injection. COMPARISON: None available. CLINICAL HISTORY:  Postoperative or volar wrist abscess. FINDINGS: LIMITATIONS/ARTIFACTS: Severe motion artifact prominently lowers diagnostic sensitivity and specificity. INTRINSIC LIGAMENTS: Grossly intact scapholunate and lunotriquetral ligaments without interval widening. No significant periligamentous edema. EXTRINSIC LIGAMENTS: The volar extrinsic ligaments including the radio-scapho-capitate and radio-luno-triquetral ligaments are grossly intact. TFCC: No gross abnormality. EXTENSOR TENDONS: Intact extensor tendons. CARPAL TUNNEL AND FLEXOR TENDONS: Poor visualization of the flexor carpi radialis tendon along the wound, believed to be along the deep margin of the wound. Involvement of the tendon sheath is likely. Other flexor tendons are intact. The flexor retinaculum is intact. Normal MRI appearance of the median nerve. Intact ulnar nerve in Guyon's canal. JOINT SPACES: No significant joint effusion. Unremarkable alignment. Abnormal marrow edema in the scaphoid and trapezium primarily at the volar portion of the articulation as on image 24 series 41 presumably due to arthropathy with infection as a differential diagnostic consideration particularly given the proximity to the flexor carpi radialis tendon. BONE MARROW: Abnormal marrow edema in the scaphoid and trapezium primarily at the volar portion of the articulation as on image 24 series 41 presumably due to arthropathy with infection as a differential diagnostic consideration particularly given the proximity to the flexor carpi radialis tendon. SOFT TISSUE: Volar wound and nonenhancing cavity presumably representing drained abscess cavity in the vicinity of the flexor carpi radialis tendon at the level of the distal radius with associated cavity measuring 2.1 x 1.1 x 1.6 cm as on image 20 series 44 and image 28 series 41. Surrounding infiltrative edema in the soft tissues with obscuration of soft tissue planes likely reflecting local cellulitis. . IMPRESSION: 1. Severe  motion artifact significantly limits diagnostic sensitivity and specificity. 2. Volar wound with nonenhancing cavity consistent with a drained abscess cavity adjacent to/along the flexor carpi radialis tendon at the distal radius, with surrounding cellulitis and likely involvement of the tendon sheath. 3. Abnormal marrow edema in the scaphoid and trapezium, infection/osteomyelitis not excluded given proximity to the flexor carpi radialis tendon. Electronically signed by: Ryan Salvage MD 12/01/2023 08:24 AM EST RP Workstation: [id number]   CT FOREARM RIGHT W CONTRAST Result  Date: 11/30/2023 EXAM: CT RIGHT FOREARM, WITH IV CONTRAST TECHNIQUE: Axial images were acquired through the right forearm with IV contrast. Reformatted images were reviewed. Automated exposure control, iterative reconstruction, and/or weight based adjustment of the mA/kV was utilized to reduce the radiation dose to as low as reasonably achievable. 75 mL of iohexol  (OMNIPAQUE ) 350 MG/ML injection was administered intravenously. COMPARISON: None available. CLINICAL HISTORY: Soft tissue mass, forearm, deep. FINDINGS: BONES AND JOINTS: No acute fracture or focal osseous lesion. No bony destructive findings characteristic of osteomyelitis. No dislocation. The joint spaces are normal. SOFT TISSUES: Severe fusiform expansion and indistinctness of the flexor carpi radialis tendon starting about 5.4 cm proximal to the radiocarpal joint with severe expansion and indistinctness of the distal tendon which is probably torn. Substantial subcutaneous edema and edema along fascial planes surrounding the severely expanded and likely discontinuous distal flexor carpi radialis tendon potentially from inflammation or cellulitis. This tracks into the volar radial wrist region. 0.7 cm metal clip near the right radial artery centered 5.2 cm proximal to the radiocarpal joint. IMPRESSION: 1. Severe fusiform expansion and indistinctness of the flexor carpi radialis  tendon, likely torn distally, with surrounding subcutaneous and fascial edema suggestive of associated soft tissue inflammation or cellulitis. I don't see a well-defined drainable abscess although incipient abscess or spontaneous draining abscess might be considered given the degree of inflammation. 2. No bony destructive findings to suggest osteomyelitis. 3. Incidental 0.7 cm metallic clip near the right radial artery approximately 5.2 cm proximal to the radiocarpal joint. Electronically signed by: Ryan Salvage MD 11/30/2023 11:42 AM EST RP Workstation: [id number]   DG Wrist Complete Right Result Date: 11/30/2023 EXAM: 3 OR MORE VIEW(S) XRAY OF THE RIGHT WRIST 11/30/2023 08:11:00 AM COMPARISON: Right wrist series dated 10/24/2023. CLINICAL HISTORY: abscess abscess FINDINGS: BONES AND JOINTS: No acute fracture. No focal osseous lesion. No joint dislocation. No foreign bodies are evident. SOFT TISSUES: There is moderate diffuse soft tissue swelling. IMPRESSION: 1. Moderate diffuse soft tissue swelling, possibly related to the reported abscess. No foreign bodies identified. 2. No acute osseous abnormality. Electronically signed by: Evalene Coho MD 11/30/2023 08:43 AM EST RP Workstation: [id number]           LOS: 1 day   Time spent= 35 mins    Burgess JAYSON Dare, MD [facility]  If 7PM-7AM, please contact night-coverage  12/01/2023, 12:25 PM

## 2023-12-01 NOTE — Hospital Course (Addendum)
 Brief Narrative:  33 y.o. 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  Upon admission noted to have right wrist complex abscess requiring I&D on 11/2.  Hospital course also complicated by hyperglycemia.   Assessment & Plan:   R wrist abscess -This is in the setting of IV drug abuse.  Status post I&D by orthopedic on 11/2.  MRI of the right wrist reviewed.  Continue IV vancomycin , Zosyn .  Consulted ID for further input.  Will follow culture data.   DM1, uncontrolled due to hyperglycemia -Currently on insulin  pump.  Accu-Cheks.  Consult diabetic coordinator - A1c 10.6   H/o IVDU -Methadone  150mg  daily.  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 Respiratory effort normal. Cardiovascular system: S1 & S2 heard, RRR. No JVD, murmurs, rubs, gallops or clicks. No pedal edema. Gastrointestinal system: Abdomen is nondistended, soft and nontender. No organomegaly or masses felt. Normal bowel sounds heard. Central nervous system: Alert and oriented. No focal neurological deficits. Extremities: Symmetric 5 x 5 power. Skin: No rashes, lesions or ulcers Psychiatry: Judgement and insight appear normal. Mood & affect appropriate.

## 2023-12-01 NOTE — Progress Notes (Signed)
   Subjective:  33 y.o. female now POD 1 from right forearm abscess I&D. Patient reports pain around her incision that is being controlled with her pain medications.  Otherwise, no acute events overnight. No numbness or tingling to the extremity.  Objective:   VITALS:   Vitals:   11/30/23 2135 12/01/23 0226 12/01/23 0635 12/01/23 1119  BP: (!) 151/100 (!) 100/57 119/78 120/78  Pulse: 70 75 75 80  Resp: 18 16 17 18   Temp: 98 F (36.7 C) 98.1 F (36.7 C) 98 F (36.7 C) 98.6 F (37 C)  TempSrc: Oral Oral Oral Oral  SpO2: 100% 99% 96% 97%  Weight:      Height:        Physical Exam: General: Alert, no acute distress Cardiovascular: No pedal edema Respiratory: No cyanosis, no use of accessory musculature GI: No organomegaly, abdomen is soft and non-tender Skin: No lesions in the area of chief complaint Neurologic: Sensation intact distally Psychiatric: Patient is competent for consent with normal mood and affect Lymphatic: No axillary or cervical lymphadenopathy  MUSCULOSKELETAL: Right upper extremity - Dressings c/d/i - TTP right wrist - Motor intact to median, radial, ulnar nerve distributions - Sensation intact to light touch median, radial, ulnar nerve distributions - < 2 sec cap refill  Lab Results  Component Value Date   WBC 7.4 12/01/2023   HGB 11.9 (L) 12/01/2023   HCT 35.2 (L) 12/01/2023   MCV 89.1 12/01/2023   PLT 281 12/01/2023   BMET    Component Value Date/Time   NA 135 12/01/2023 0626   NA 138 05/02/2023 1510   K 3.8 12/01/2023 0626   CL 100 12/01/2023 0626   CO2 25 12/01/2023 0626   GLUCOSE 221 (H) 12/01/2023 0626   BUN 10 12/01/2023 0626   BUN 14 05/02/2023 1510   CREATININE 0.74 12/01/2023 0626   CREATININE 0.77 12/29/2014 1421   CALCIUM 7.9 (L) 12/01/2023 0626   EGFR 107 05/02/2023 1510   GFRNONAA >60 12/01/2023 0626   GFRNONAA >89 12/29/2014 1421     Assessment/Plan: 33 y.o. female  now 1 Day Post-Op from Principal Problem:   Abscess  of skin of right wrist .  Patient is doing well.  Plan to remove packing tomorrow and perform skin check.  Continue to trend CRP q 48 hours. Continue IV abx, appreciate ID recs  Weight bearing: NWB RUE in splint Pain control: oxy PT/OT DVT ppx: None Bowel regimen: docusate Cx: demonstrating strep constellatus Continue IV abx, changed to ceftriaxone  per ID,  appreciate ID recs Continue tight blood glucose control with goal blood sugars < 150 Dispo: pending cx sensitivity and wound recheck   Rankin LELON Pizza 12/01/2023, 6:13 PM

## 2023-12-01 NOTE — Progress Notes (Signed)
 Pt assisted to and from restroom in room. Pt returned back to bed. Bed alarm on. Bed in lowest position. Call light in reach. All needs met at this time.

## 2023-12-01 NOTE — Inpatient Diabetes Management (Signed)
 Inpatient Diabetes Program Recommendations  AACE/ADA: New Consensus Statement on Inpatient Glycemic Control (2015)  Target Ranges:  Prepandial:   less than 140 mg/dL      Peak postprandial:   less than 180 mg/dL (1-2 hours)      Critically ill patients:  140 - 180 mg/dL   Lab Results  Component Value Date   GLUCAP 257 (H) 12/01/2023   HGBA1C 10.6 (H) 11/30/2023    Latest Reference Range & Units 11/30/23 10:39 11/30/23 11:31 11/30/23 13:28 11/30/23 16:35 11/30/23 18:18 11/30/23 20:34 12/01/23 00:21 12/01/23 04:07 12/01/23 07:56  Glucose-Capillary 70 - 99 mg/dL 450 (HH) 469 (HH) 669 (H) 97 105 (H) 113 (H) 122 (H) 147 (H) 257 (H)  (HH): Data is critically high (H): Data is abnormally high  Diabetes history: DM1(does not make insulin .  Needs correction, basal and meal coverage)   Outpatient Diabetes medications:  Basaglar  35 units every day Humalog  4-30 units TID   Current orders for Inpatient glycemic control:  Novolog  0-15 units TID   Inpatient Diabetes Program Recommendations:   Patient has type 1 diabetes. Patient has received total Novolog  18 units since arrived to the hospital but has not received any basal insulin . Please consider: -Lantus /Semglee  30 units now and daily -Add Novolog  3 units tid meal coverage -Change Novolog  correction to 0-9 units q 4 hrs.  Spoke with patient regarding diabetes management. Reviewed A1c with patient. Patient has only been taking her basal insulin  most of the time. Patient shared that she is currently waiting to check in to a drug rehab program. Patient does not currently have an endocrinologist but would like to have a referral to see one in the near future. She has seen Dr. Hershal over 10 years ago. Had an insulin  pump in the past but has been on injections for quite some time.   Thank you, Illias Pantano E. Kingsten Enfield, RN, MSN, CNS, CDCES  Diabetes Coordinator Inpatient Glycemic Control Team Team Pager [phone] (8am-5pm) 12/01/2023 11:18  AM

## 2023-12-01 NOTE — Consult Note (Signed)
 [facility]    Date of Admission:  11/30/2023      Total days of antibiotics 2  Ceftriaxone                 Reason for Consult: Forearm/wrist abscess    Referring Provider: Caleen Primary Care Provider: Oley Bascom RAMAN, NP   Assessment: Anna Dennis is a 33 y.o. female admitted with:   Rt Wrist Abscess -  Cultures growing out moderate streptococcus constellatus. In the setting of injection drug use. No systemic symptoms but waxing and waning with abx use outpatient that always worsened following stopping them.  Surgical notes reviewed - multiloculated abscess measuring 1 x 1 x 4 cm surrounding the compartment tendons. She probably needs 2-3 weeks of antibiotics given extension to deeper soft tissue structures. Initial CRP normal at 0.8.  - Continue ceftriaxone  until susceptibilities are back.   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 On methadone  150 mg every day.  Hep C RNA not detected last 3 years ago  - re-screen hep C rna, Hep B sAg/cAb/sAb   Plan: - Continue ceftriaxone  until susceptibilities are back.  - Hep C RNA, Hep B sAg/cAb/sAb     Principal Problem:   Abscess of skin of right wrist    acetaminophen   1,000 mg Oral TID   enoxaparin  (LOVENOX ) injection  40 mg Subcutaneous Q24H   gabapentin  300 mg Oral TID   insulin  aspart  0-5 Units Subcutaneous QHS   insulin  aspart  0-9 Units Subcutaneous TID WC   insulin  aspart  3 Units Subcutaneous TID WC   insulin  glargine-yfgn  30 Units Subcutaneous Daily   methadone   70 mg Oral q AM   And   methadone   40 mg Oral QPM   methocarbamol  500 mg Oral TID    HPI: Anna Dennis is a 33 y.o. female readmitted for management of right wrist/forearm abscess.   Initial problem started on 10/25/23.  Was on treatment for this problem and treated with  clindamycin  and 2 I&Ds the last month.  She was in the process of rehab at [facility] - waiting on discussions with methadone  clinic and SW team  She did not have any fevers or chills during the illness. Just the pain, especially when the antibiotics were stopped. She had some IV antibiotics in the hospital setting.   She has trouble with b/l LE swelling.    Review of Systems: Review of Systems  Constitutional:  Negative for chills and fever.  Respiratory: Negative.    Cardiovascular: Negative.   Gastrointestinal:  Negative for abdominal pain, nausea and vomiting.  Musculoskeletal:  Positive for joint pain.  Skin:  Negative for rash.  Neurological:  Negative for dizziness, tingling and headaches.     Past Medical History:  Diagnosis Date   Depression    Diabetes mellitus    Insulin  pump   GERD (gastroesophageal reflux disease)    Hepatitis C    Heroin abuse (HCC)    Iron deficiency anemia    IVDU (intravenous drug user)    OCD (obsessive compulsive disorder)     Social History   Tobacco Use   Smoking status: Every Day    Types: Cigarettes   Smokeless tobacco: Never  Vaping Use   Vaping status: Never  Used  Substance Use Topics   Alcohol use: Yes    Alcohol/week: 0.0 standard drinks of alcohol    Comment: occasional   Drug use: Not Currently    Comment: patient states clean since 03/29/14    Family History  Problem Relation Age of Onset   Bipolar disorder Mother    Schizophrenia Sister    No Known Allergies  OBJECTIVE: Blood pressure 120/78, pulse 80, temperature 98.6 F (37 C), temperature source Oral, resp. rate 18, height 5' 7 (1.702 m), weight 72.6 kg, SpO2 97%.  Physical Exam Constitutional:      Appearance: Normal appearance. She is not ill-appearing.  Cardiovascular:     Rate and Rhythm: Normal rate and regular rhythm.  Neurological:     Mental Status: She is alert.     Lab Results Lab Results  Component Value Date   WBC 7.4 12/01/2023    HGB 11.9 (L) 12/01/2023   HCT 35.2 (L) 12/01/2023   MCV 89.1 12/01/2023   PLT 281 12/01/2023    Lab Results  Component Value Date   CREATININE 0.74 12/01/2023   BUN 10 12/01/2023   NA 135 12/01/2023   K 3.8 12/01/2023   CL 100 12/01/2023   CO2 25 12/01/2023    Lab Results  Component Value Date   ALT 58 (H) 10/24/2023   AST 39 10/24/2023   ALKPHOS 152 (H) 10/24/2023   BILITOT 0.6 10/24/2023     Microbiology: Recent Results (from the past 240 hours)  Blood culture (routine x 2)     Status: None (Preliminary result)   Collection Time: 11/30/23  7:43 AM   Specimen: BLOOD RIGHT FOREARM  Result Value Ref Range Status   Specimen Description BLOOD RIGHT FOREARM  Final   Special Requests   Final    BOTTLES DRAWN AEROBIC AND ANAEROBIC Blood Culture results may not be optimal due to an inadequate volume of blood received in culture bottles   Culture   Final    NO GROWTH < 24 HOURS Performed at [facility], [address], [place], [place] [address]    Report Status PENDING  Incomplete  Aerobic Culture w Gram Stain (superficial specimen)     Status: None (Preliminary result)   Collection Time: 11/30/23  8:55 AM   Specimen: Wound  Result Value Ref Range Status   Specimen Description WOUND  Final   Special Requests NONE  Final   Gram Stain RARE WBC SEEN MODERATE GRAM POSITIVE COCCI   Final   Culture   Final    MODERATE STREPTOCOCCUS CONSTELLATUS SUSCEPTIBILITIES TO FOLLOW Performed at [facility], [address] [place], [place] [address]    Report Status PENDING  Incomplete  Aerobic/Anaerobic Culture w Gram Stain (surgical/deep wound)     Status: None (Preliminary result)   Collection Time: 11/30/23 12:38 PM   Specimen: Path fluid; Body Fluid  Result Value Ref Range Status   Specimen Description FLUID  Final   Special Requests RIGHT FOREARM ABSCESS  Final   Gram Stain   Final    FEW WBC PRESENT, PREDOMINANTLY PMN MODERATE GRAM POSITIVE COCCI Performed  at [facility], [address], [place], [place] [address]    Culture MODERATE STREPTOCOCCUS CONSTELLATUS  Final   Report Status PENDING  Incomplete  Blood culture (routine x 2)     Status: None (Preliminary result)   Collection Time: 11/30/23  4:37 PM   Specimen: BLOOD LEFT HAND  Result Value Ref Range  Status   Specimen Description BLOOD LEFT HAND  Final   Special Requests   Final    BOTTLES DRAWN AEROBIC ONLY Blood Culture results may not be optimal due to an inadequate volume of blood received in culture bottles   Culture   Final    NO GROWTH < 24 HOURS Performed at [facility], [address], [place], [place] [address]    Report Status PENDING  Incomplete    Corean Fireman, MSN, NP-C [facility] Pager: [phone]  12/01/2023 11:58 AM    Total Encounter Time: 15 m

## 2023-12-02 DIAGNOSIS — L02413 Cutaneous abscess of right upper limb: Secondary | ICD-10-CM | POA: Diagnosis not present

## 2023-12-02 LAB — CBC
HCT: 34.4 % — ABNORMAL LOW (ref 36.0–46.0)
Hemoglobin: 11.4 g/dL — ABNORMAL LOW (ref 12.0–15.0)
MCH: 29.4 pg (ref 26.0–34.0)
MCHC: 33.1 g/dL (ref 30.0–36.0)
MCV: 88.7 fL (ref 80.0–100.0)
Platelets: 268 K/uL (ref 150–400)
RBC: 3.88 MIL/uL (ref 3.87–5.11)
RDW: 12.6 % (ref 11.5–15.5)
WBC: 5 K/uL (ref 4.0–10.5)
nRBC: 0 % (ref 0.0–0.2)

## 2023-12-02 LAB — GLUCOSE, CAPILLARY
Glucose-Capillary: 138 mg/dL — ABNORMAL HIGH (ref 70–99)
Glucose-Capillary: 206 mg/dL — ABNORMAL HIGH (ref 70–99)
Glucose-Capillary: 227 mg/dL — ABNORMAL HIGH (ref 70–99)
Glucose-Capillary: 232 mg/dL — ABNORMAL HIGH (ref 70–99)
Glucose-Capillary: 336 mg/dL — ABNORMAL HIGH (ref 70–99)
Glucose-Capillary: 345 mg/dL — ABNORMAL HIGH (ref 70–99)

## 2023-12-02 LAB — AEROBIC CULTURE W GRAM STAIN (SUPERFICIAL SPECIMEN)

## 2023-12-02 LAB — BASIC METABOLIC PANEL WITH GFR
Anion gap: 8 (ref 5–15)
BUN: 16 mg/dL (ref 6–20)
CO2: 27 mmol/L (ref 22–32)
Calcium: 8.4 mg/dL — ABNORMAL LOW (ref 8.9–10.3)
Chloride: 99 mmol/L (ref 98–111)
Creatinine, Ser: 0.85 mg/dL (ref 0.44–1.00)
GFR, Estimated: >60 mL/min (ref >60)
Glucose, Bld: 248 mg/dL — ABNORMAL HIGH (ref 70–99)
Potassium: 4 mmol/L (ref 3.5–5.1)
Sodium: 134 mmol/L — ABNORMAL LOW (ref 135–145)

## 2023-12-02 LAB — HEPATITIS B SURFACE ANTIBODY,QUALITATIVE: Hep B S Ab: REACTIVE — AB

## 2023-12-02 LAB — C-REACTIVE PROTEIN: CRP: 4.8 mg/dL — ABNORMAL HIGH (ref <1.0)

## 2023-12-02 LAB — HEPATITIS B SURFACE ANTIGEN: Hepatitis B Surface Ag: NONREACTIVE

## 2023-12-02 LAB — MAGNESIUM: Magnesium: 1.9 mg/dL (ref 1.7–2.4)

## 2023-12-02 LAB — SEDIMENTATION RATE: Sed Rate: 24 mm/h — ABNORMAL HIGH (ref 0–22)

## 2023-12-02 MED ORDER — INSULIN ASPART 100 UNIT/ML IJ SOLN
5.0000 [IU] | Freq: Three times a day (TID) | INTRAMUSCULAR | Status: DC
  Administered 2023-12-02 – 2023-12-03 (×4): 5 [IU] via SUBCUTANEOUS
  Filled 2023-12-02 (×4): qty 5

## 2023-12-02 MED ORDER — INSULIN GLARGINE-YFGN 100 UNIT/ML ~~LOC~~ SOLN
35.0000 [IU] | Freq: Every day | SUBCUTANEOUS | Status: DC
  Administered 2023-12-02: 35 [IU] via SUBCUTANEOUS
  Filled 2023-12-02 (×2): qty 0.35

## 2023-12-02 NOTE — Progress Notes (Signed)
 Brief ID Note -   Still waiting for final cultures - strep constellatus PCN MIC < 0.06, CTX active.   Final recs for treatment pending.  Ortho note reviewed - plan to change packing again in 2 days (Thursday).    Corean Fireman, MSN, NP-C [facility]  [email or web address] Pager: [phone] Office: [phone] [facility] Main Line: [phone] *Secure Chat Communication Welcome

## 2023-12-02 NOTE — Progress Notes (Signed)
   Subjective:  33 y.o. female now POD 2 from right forearm abscess I&D. Patient reports she has some pain, but it has been controlled with medications.  Otherwise, no acute events overnight. No numbness or tingling to the extremity. She has had no fevers or chills.  Objective:   VITALS:   Vitals:   12/01/23 1818 12/01/23 2156 12/02/23 0541 12/02/23 0949  BP: 104/67 113/75 (!) 136/90 112/78  Pulse: 73 78 75 78  Resp: 17 18 18 17   Temp: 98 F (36.7 C) 98.4 F (36.9 C) 98.5 F (36.9 C) 98.3 F (36.8 C)  TempSrc: Oral Oral Oral Oral  SpO2: 96% 96% 98% 97%  Weight:      Height:        Physical Exam: General: Alert, no acute distress Cardiovascular: No pedal edema Respiratory: No cyanosis, no use of accessory musculature GI: No organomegaly, abdomen is soft and non-tender Skin: No lesions in the area of chief complaint Neurologic: Sensation intact distally Psychiatric: Patient is competent for consent with normal mood and affect Lymphatic: No axillary or cervical lymphadenopathy  MUSCULOSKELETAL:  Right upper extremity - dressings removed.  Small area of necrosis of the skin at site of prior sinus tract.  No erythema of the skin, scant purulence present in the wound. - TTP to the volar distal forearm - no pain with ROM of the wrist - Motor intact to median/radial/ ulnar distributions nerve distributions - Sensation intact to light touch median, radial, ulnar nerve distributions - 2+ radial pulse  Lab Results  Component Value Date   WBC 5.0 12/02/2023   HGB 11.4 (L) 12/02/2023   HCT 34.4 (L) 12/02/2023   MCV 88.7 12/02/2023   PLT 268 12/02/2023   BMET    Component Value Date/Time   NA 134 (L) 12/02/2023 0611   NA 138 05/02/2023 1510   K 4.0 12/02/2023 0611   CL 99 12/02/2023 0611   CO2 27 12/02/2023 0611   GLUCOSE 248 (H) 12/02/2023 0611   BUN 16 12/02/2023 0611   BUN 14 05/02/2023 1510   CREATININE 0.85 12/02/2023 0611   CREATININE 0.77 12/29/2014 1421    CALCIUM 8.4 (L) 12/02/2023 0611   EGFR 107 05/02/2023 1510   GFRNONAA >60 12/02/2023 0611   GFRNONAA >89 12/29/2014 1421     Assessment/Plan: 33 y.o. female  now 2 Days Post-Op from Principal Problem:   Abscess of skin of right wrist Patient now 2 days post op from right wrist I&D.  Packing changed today and new iodoform packing placed. There is a 2 x 1 cm area of skin necrosis where her prior sinus tract had been extending ulnarly.  This will be treated with healing by secondary intention and serial debridements with iodoform packing.  Weight bearing: NWB RUE in volar slab splint Pain control: tylenol  and oxy Continue IV abx per ID, appreciate ID recs Cultures growing strep constellatus Plan to change packing in 2 days. Continue tight blood glucose control to assist in wound healing Dispo: pending re evaluation of the wound and abx  plan   Rankin LELON Pizza 12/02/2023, 12:44 PM

## 2023-12-02 NOTE — Plan of Care (Signed)

## 2023-12-02 NOTE — Progress Notes (Signed)
 PROGRESS NOTE    Anna Dennis  FMW:[id number] DOB: [birth year] DOA: 11/30/2023 PCP: Oley Bascom RAMAN, NP    Brief Narrative:  33 y.o. 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  Upon admission noted to have right wrist complex abscess requiring I&D on 11/2.  Hospital course also complicated by hyperglycemia.   Assessment & Plan:   R wrist abscess -This is in the setting of IV drug abuse.  Status post I&D by orthopedic on 11/2.  MRI of the right wrist reviewed.  Continue IV vancomycin , Zosyn .  Consulted ID for further input.  Will follow culture data.   DM1, uncontrolled due to hyperglycemia -Currently on insulin  pump.  Accu-Cheks.  Consult diabetic coordinator - A1c 10.6   H/o IVDU -Methadone  150mg  daily.  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 Respiratory effort normal. Cardiovascular system: S1 & S2 heard, RRR. No JVD, murmurs, rubs, gallops or clicks. No pedal edema. Gastrointestinal system: Abdomen is nondistended, soft and nontender. No organomegaly or masses felt. Normal bowel sounds heard. Central nervous system: Alert and oriented. No focal neurological deficits. Extremities: Symmetric 5 x 5 power. Skin: No rashes, lesions or ulcers Psychiatry: Judgement and insight appear normal. Mood & affect appropriate.                Diet Orders (From  admission, onward)     Start     Ordered   11/30/23 1648  Diet Carb Modified Fluid consistency: Thin; Room service appropriate? Yes  Diet effective now       Question Answer Comment  Diet-HS Snack? Nothing   Calorie Level Medium 1600-2000   Fluid consistency: Thin   Room service appropriate? Yes      11/30/23 1648            Objective: Vitals:   12/01/23 1818 12/01/23 2156 12/02/23 0541 12/02/23 0949  BP: 104/67 113/75 (!) 136/90 112/78  Pulse: 73 78 75 78  Resp: 17 18 18 17   Temp: 98 F (36.7 C) 98.4 F (36.9 C) 98.5 F (36.9 C) 98.3 F (36.8 C)  TempSrc: Oral Oral Oral Oral  SpO2: 96% 96% 98% 97%  Weight:      Height:        Intake/Output Summary (Last 24 hours) at 12/02/2023 1218 Last data filed at 12/02/2023 0541 Gross per 24 hour  Intake 720 ml  Output --  Net 720 ml   Filed Weights   11/30/23 0338 11/30/23 1129  Weight: 72.6 kg 72.6 kg    Scheduled Meds:  acetaminophen   1,000 mg Oral TID   enoxaparin  (LOVENOX ) injection  40 mg Subcutaneous Q24H   escitalopram  20 mg Oral Daily   gabapentin  300 mg Oral TID   insulin  aspart  0-5 Units Subcutaneous QHS   insulin  aspart  0-9 Units Subcutaneous TID WC   insulin  aspart  5 Units Subcutaneous TID WC   insulin  glargine-yfgn  35 Units  Subcutaneous Daily   methadone   70 mg Oral q AM   And   methadone   40 mg Oral QPM   methocarbamol  500 mg Oral TID   nicotine   7 mg Transdermal Daily   Continuous Infusions:  cefTRIAXone  (ROCEPHIN )  IV 2 g (12/01/23 1229)    Nutritional status     Body mass index is 25.06 kg/m.  Data Reviewed:   CBC: Recent Labs  Lab 11/30/23 0743 12/01/23 0403 12/02/23 0611  WBC 6.7 7.4 5.0  NEUTROABS 4.3 5.0  --   HGB 11.6* 11.9* 11.4*  HCT 35.3* 35.2* 34.4*  MCV 89.4 89.1 88.7  PLT 284 281 268   Basic Metabolic Panel: Recent Labs  Lab 11/30/23 0743 12/01/23 0626 12/01/23 2341 12/02/23 0611  NA 130* 135  --  134*  K 4.3 3.8  --  4.0  CL 94* 100  --  99  CO2 25  25  --  27  GLUCOSE 606* 221*  --  248*  BUN 17 10  --  16  CREATININE 0.91 0.74  --  0.85  CALCIUM 8.3* 7.9*  --  8.4*  MG  --   --  1.9  --    GFR: Estimated Creatinine Clearance: 90.7 mL/min (by C-G formula based on SCr of 0.85 mg/dL). Liver Function Tests: No results for input(s): AST, ALT, ALKPHOS, BILITOT, PROT, ALBUMIN in the last 168 hours. No results for input(s): LIPASE, AMYLASE in the last 168 hours. No results for input(s): AMMONIA in the last 168 hours. Coagulation Profile: No results for input(s): INR, PROTIME in the last 168 hours. Cardiac Enzymes: No results for input(s): CKTOTAL, CKMB, CKMBINDEX, TROPONINI in the last 168 hours. BNP (last 3 results) No results for input(s): PROBNP in the last 8760 hours. HbA1C: Recent Labs    11/30/23 1014  HGBA1C 10.6*   CBG: Recent Labs  Lab 12/01/23 1957 12/01/23 2322 12/02/23 0332 12/02/23 0744 12/02/23 1145  GLUCAP 71 283* 232* 227* 336*   Lipid Profile: No results for input(s): CHOL, HDL, LDLCALC, TRIG, CHOLHDL, LDLDIRECT in the last 72 hours. Thyroid Function Tests: No results for input(s): TSH, T4TOTAL, FREET4, T3FREE, THYROIDAB in the last 72 hours. Anemia Panel: No results for input(s): VITAMINB12, FOLATE, FERRITIN, TIBC, IRON, RETICCTPCT in the last 72 hours. Sepsis Labs: No results for input(s): PROCALCITON, LATICACIDVEN in the last 168 hours.  Recent Results (from the past 240 hours)  Blood culture (routine x 2)     Status: None (Preliminary result)   Collection Time: 11/30/23  7:43 AM   Specimen: BLOOD RIGHT FOREARM  Result Value Ref Range Status   Specimen Description BLOOD RIGHT FOREARM  Final   Special Requests   Final    BOTTLES DRAWN AEROBIC AND ANAEROBIC Blood Culture results may not be optimal due to an inadequate volume of blood received in culture bottles   Culture   Final    NO GROWTH 2 DAYS Performed at [facility], [address] [place], [place] [address]    Report Status PENDING  Incomplete  Aerobic Culture w Gram Stain (superficial specimen)     Status: None   Collection Time: 11/30/23  8:55 AM   Specimen: Wound  Result Value Ref Range Status   Specimen Description WOUND  Final   Special Requests NONE  Final   Gram Stain   Final    RARE WBC SEEN MODERATE GRAM POSITIVE COCCI Performed at [facility], [address] [place],  [place] [address]    Culture MODERATE STREPTOCOCCUS CONSTELLATUS  Final   Report Status 12/02/2023 FINAL  Final   Organism ID, Bacteria STREPTOCOCCUS CONSTELLATUS  Final      Susceptibility   Streptococcus constellatus - MIC*    PENICILLIN <=0.06 SENSITIVE Sensitive     CEFTRIAXONE  0.25 SENSITIVE Sensitive     ERYTHROMYCIN >=8 RESISTANT Resistant     LEVOFLOXACIN <=0.25 SENSITIVE Sensitive     VANCOMYCIN  0.5 SENSITIVE Sensitive     * MODERATE STREPTOCOCCUS CONSTELLATUS  Aerobic/Anaerobic Culture w Gram Stain (surgical/deep wound)     Status: None   Collection Time: 11/30/23 12:38 PM   Specimen: Path fluid; Body Fluid  Result Value Ref Range Status   Specimen Description FLUID  Final   Special Requests RIGHT FOREARM ABSCESS  Final   Gram Stain   Final    FEW WBC PRESENT, PREDOMINANTLY PMN MODERATE GRAM POSITIVE COCCI    Culture   Final    MODERATE STREPTOCOCCUS CONSTELLATUS SUSCEPTIBILITIES PERFORMED ON PREVIOUS CULTURE WITHIN THE LAST 5 DAYS. Performed at [facility], [address] [place], [place] [address]    Report Status 12/02/2023 FINAL  Final  Blood culture (routine x 2)     Status: None (Preliminary result)   Collection Time: 11/30/23  4:37 PM   Specimen: BLOOD LEFT HAND  Result Value Ref Range Status   Specimen Description BLOOD LEFT HAND  Final   Special Requests   Final    BOTTLES DRAWN AEROBIC ONLY Blood Culture results may not be optimal due to an inadequate volume of blood received in culture bottles   Culture   Final     NO GROWTH 2 DAYS Performed at [facility], [address] [place], [place] [address]    Report Status PENDING  Incomplete         Radiology Studies: MR WRIST RIGHT W WO CONTRAST Result Date: 12/01/2023 EXAM: MRI OF THE RIGHT WRIST WITH AND WITHOUT INTRAVENOUS CONTRAST 11/30/2023 10:29:00 PM TECHNIQUE: Multiplanar multisequence MRI of the right wrist was performed with and without the administration of intravenous contrast. 7 mL gadobutrol (GADAVIST) 1 MMOL/ML injection. COMPARISON: None available. CLINICAL HISTORY: Postoperative or volar wrist abscess. FINDINGS: LIMITATIONS/ARTIFACTS: Severe motion artifact prominently lowers diagnostic sensitivity and specificity. INTRINSIC LIGAMENTS: Grossly intact scapholunate and lunotriquetral ligaments without interval widening. No significant periligamentous edema. EXTRINSIC LIGAMENTS: The volar extrinsic ligaments including the radio-scapho-capitate and radio-luno-triquetral ligaments are grossly intact. TFCC: No gross abnormality. EXTENSOR TENDONS: Intact extensor tendons. CARPAL TUNNEL AND FLEXOR TENDONS: Poor visualization of the flexor carpi radialis tendon along the wound, believed to be along the deep margin of the wound. Involvement of the tendon sheath is likely. Other flexor tendons are intact. The flexor retinaculum is intact. Normal MRI appearance of the median nerve. Intact ulnar nerve in Guyon's canal. JOINT SPACES: No significant joint effusion. Unremarkable alignment. Abnormal marrow edema in the scaphoid and trapezium primarily at the volar portion of the articulation as on image 24 series 41 presumably due to arthropathy with infection as a differential diagnostic consideration particularly given the proximity to the flexor carpi radialis tendon. BONE MARROW: Abnormal marrow edema in the scaphoid and trapezium primarily at the volar portion of the articulation as on image 24 series 41 presumably due to arthropathy with infection as a  differential diagnostic consideration particularly given the proximity to the flexor carpi radialis tendon. SOFT TISSUE: Volar wound and nonenhancing cavity presumably representing drained abscess cavity in the vicinity of the flexor  carpi radialis tendon at the level of the distal radius with associated cavity measuring 2.1 x 1.1 x 1.6 cm as on image 20 series 44 and image 28 series 41. Surrounding infiltrative edema in the soft tissues with obscuration of soft tissue planes likely reflecting local cellulitis. . IMPRESSION: 1. Severe motion artifact significantly limits diagnostic sensitivity and specificity. 2. Volar wound with nonenhancing cavity consistent with a drained abscess cavity adjacent to/along the flexor carpi radialis tendon at the distal radius, with surrounding cellulitis and likely involvement of the tendon sheath. 3. Abnormal marrow edema in the scaphoid and trapezium, infection/osteomyelitis not excluded given proximity to the flexor carpi radialis tendon. Electronically signed by: Ryan Salvage MD 12/01/2023 08:24 AM EST RP Workstation: [id number]           LOS: 2 days   Time spent= 35 mins    Burgess JAYSON Dare, MD [facility]  If 7PM-7AM, please contact night-coverage  12/02/2023, 12:18 PM

## 2023-12-03 ENCOUNTER — Inpatient Hospital Stay: Payer: Self-pay | Admitting: Nurse Practitioner

## 2023-12-03 DIAGNOSIS — M65131 Other infective (teno)synovitis, right wrist: Secondary | ICD-10-CM | POA: Diagnosis not present

## 2023-12-03 DIAGNOSIS — B955 Unspecified streptococcus as the cause of diseases classified elsewhere: Secondary | ICD-10-CM | POA: Diagnosis not present

## 2023-12-03 DIAGNOSIS — L0889 Other specified local infections of the skin and subcutaneous tissue: Secondary | ICD-10-CM

## 2023-12-03 DIAGNOSIS — L02413 Cutaneous abscess of right upper limb: Secondary | ICD-10-CM | POA: Diagnosis not present

## 2023-12-03 LAB — BASIC METABOLIC PANEL WITH GFR
Anion gap: 18 — ABNORMAL HIGH (ref 5–15)
BUN: 17 mg/dL (ref 6–20)
CO2: 20 mmol/L — ABNORMAL LOW (ref 22–32)
Calcium: 8.9 mg/dL (ref 8.9–10.3)
Chloride: 95 mmol/L — ABNORMAL LOW (ref 98–111)
Creatinine, Ser: 0.83 mg/dL (ref 0.44–1.00)
GFR, Estimated: >60 mL/min (ref >60)
Glucose, Bld: 334 mg/dL — ABNORMAL HIGH (ref 70–99)
Potassium: 4.2 mmol/L (ref 3.5–5.1)
Sodium: 133 mmol/L — ABNORMAL LOW (ref 135–145)

## 2023-12-03 LAB — CBC
HCT: 41.7 % (ref 36.0–46.0)
Hemoglobin: 13.8 g/dL (ref 12.0–15.0)
MCH: 29.2 pg (ref 26.0–34.0)
MCHC: 33.1 g/dL (ref 30.0–36.0)
MCV: 88.2 fL (ref 80.0–100.0)
Platelets: 267 K/uL (ref 150–400)
RBC: 4.73 MIL/uL (ref 3.87–5.11)
RDW: 12.3 % (ref 11.5–15.5)
WBC: 7.6 K/uL (ref 4.0–10.5)
nRBC: 0 % (ref 0.0–0.2)

## 2023-12-03 LAB — GLUCOSE, CAPILLARY
Glucose-Capillary: 367 mg/dL — ABNORMAL HIGH (ref 70–99)
Glucose-Capillary: 338 mg/dL — ABNORMAL HIGH (ref 70–99)
Glucose-Capillary: 267 mg/dL — ABNORMAL HIGH (ref 70–99)
Glucose-Capillary: 356 mg/dL — ABNORMAL HIGH (ref 70–99)
Glucose-Capillary: 70 mg/dL (ref 70–99)
Glucose-Capillary: 106 mg/dL — ABNORMAL HIGH (ref 70–99)

## 2023-12-03 LAB — HIV ANTIBODY (ROUTINE TESTING W REFLEX): HIV Screen 4th Generation wRfx: NONREACTIVE

## 2023-12-03 LAB — HCV RNA QUANT: HCV Quantitative: NOT DETECTED [IU]/mL (ref >50)

## 2023-12-03 MED ORDER — INSULIN GLARGINE-YFGN 100 UNIT/ML ~~LOC~~ SOLN
45.0000 [IU] | Freq: Every day | SUBCUTANEOUS | Status: DC
  Administered 2023-12-03: 45 [IU] via SUBCUTANEOUS
  Filled 2023-12-03 (×2): qty 0.45

## 2023-12-03 MED ORDER — LOSARTAN POTASSIUM 25 MG PO TABS
25.0000 mg | ORAL_TABLET | Freq: Every day | ORAL | Status: DC
  Administered 2023-12-03 – 2023-12-05 (×3): 25 mg via ORAL
  Filled 2023-12-03 (×3): qty 1

## 2023-12-03 MED ORDER — HYDRALAZINE HCL 20 MG/ML IJ SOLN: 10.0000 mg | INTRAMUSCULAR | Status: DC | PRN

## 2023-12-03 MED ORDER — AMOXICILLIN 500 MG PO CAPS
1000.0000 mg | ORAL_CAPSULE | Freq: Three times a day (TID) | ORAL | Status: DC
  Administered 2023-12-03 – 2023-12-05 (×6): 1000 mg via ORAL
  Filled 2023-12-03 (×8): qty 2

## 2023-12-03 NOTE — Progress Notes (Signed)
   12/03/23 0742  Vitals  BP (!) 174/103  BP Location Right Arm  BP Method Automatic  Patient Position (if appropriate) Lying  Pulse Rate 99  Pulse Rate Source Dinamap  Resp 17  Level of Consciousness  Level of Consciousness Alert  MEWS COLOR  MEWS Score Color Green  Oxygen Therapy  SpO2 100 %  O2 Device Room Air  Pain Assessment  Pain Scale 0-10  Pain Score 7  Pain Type Acute pain  Pain Location Wrist  Pain Orientation Right  Pain Descriptors / Indicators Aching  Pain Intervention(s) Medication (See eMAR)  MEWS Score  MEWS Temp 0  MEWS Systolic 0  MEWS Pulse 0  MEWS RR 0  MEWS LOC 0  MEWS Score 0

## 2023-12-03 NOTE — Progress Notes (Signed)
 Contacted provider regarding the MEWS, vitals and CBG. See new orders.

## 2023-12-03 NOTE — Progress Notes (Signed)
   12/03/23 1300  Vitals  BP (!) 137/90  BP Location Right Arm  BP Method Automatic  Patient Position (if appropriate) Sitting  Pulse Rate 96  Pulse Rate Source Dinamap  Level of Consciousness  Level of Consciousness Alert  MEWS COLOR  MEWS Score Color Green  Oxygen Therapy  SpO2 100 %  O2 Device Room Air  MEWS Score  MEWS Temp 0  MEWS Systolic 0  MEWS Pulse 0  MEWS RR 0  MEWS LOC 0  MEWS Score 0

## 2023-12-03 NOTE — Progress Notes (Signed)
 Pt Started on losartan  .

## 2023-12-03 NOTE — Plan of Care (Signed)

## 2023-12-03 NOTE — Progress Notes (Signed)
 PROGRESS NOTE    Anna Dennis  FMW:[id number] DOB: 1990-05-04 DOA: 11/30/2023 PCP: Oley Bascom RAMAN, NP    Brief Narrative:  33 y.o. 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  Upon admission noted to have right wrist complex abscess requiring I&D on 11/2.  Hospital course also complicated by hyperglycemia.  Currently followed by infectious disease team.   Assessment & Plan:   R wrist abscess, Streptococcus -This is in the setting of IV drug abuse.  Status post I&D by orthopedic on 11/2.  MRI of the right wrist reviewed.  Seen by ID, follow culture data and continue antibiotic treatment per ID recommendations   DM1, uncontrolled due to hyperglycemia - A1c 10.6.  Seen by diabetic coordinator.  Will adjust long-acting and Premeal insulin  as needed.  Continue sliding scale.  Uncontrolled blood pressure - Start losartan  25 mg.  IV as needed   H/o IVDU -Methadone  150mg  daily.  Counseled to quit using this  OCD/depression - Reports she is on daily Lexapro  DVT prophylaxis: Lovenox     Code Status: Full Code Family Communication:   Status is: Inpatient Remains inpatient appropriate because: Continue hospital stay   PT Follow up Recs:   Subjective:  No complaints feeling well  Examination:  General exam: Appears calm and comfortable  Respiratory system: Clear to auscultation. Respiratory effort normal. Cardiovascular system: S1 & S2 heard, RRR. No JVD, murmurs, rubs, gallops or clicks. No pedal edema. Gastrointestinal system: Abdomen is nondistended, soft and nontender. No organomegaly or masses felt. Normal bowel sounds heard. Central nervous system: Alert and oriented. No focal neurological deficits. Extremities: Symmetric 5 x 5 power. Skin: No rashes, lesions or ulcers Psychiatry: Judgement and insight appear normal. Mood & affect  appropriate.                Diet Orders (From admission, onward)     Start     Ordered   11/30/23 1648  Diet Carb Modified Fluid consistency: Thin; Room service appropriate? Yes  Diet effective now       Question Answer Comment  Diet-HS Snack? Nothing   Calorie Level Medium 1600-2000   Fluid consistency: Thin   Room service appropriate? Yes      11/30/23 1648            Objective: Vitals:   12/02/23 2019 12/03/23 0551 12/03/23 0742 12/03/23 1059  BP: (!) 156/89 (!) 168/98 (!) 174/103 (!) 126/92  Pulse: (!) 102 (!) 122 99 (!) 125  Resp: 18 18 17 17   Temp: 98.2 F (36.8 C) 98.1 F (36.7 C)  97.7 F (36.5 C)  TempSrc: Oral Oral  Oral  SpO2: 100% 100% 100% 99%  Weight:      Height:        Intake/Output Summary (Last 24 hours) at 12/03/2023 1220 Last data filed at 12/03/2023 1059 Gross per 24 hour  Intake 936.05 ml  Output --  Net 936.05 ml   Filed Weights   11/30/23 0338 11/30/23 1129  Weight: 72.6 kg 72.6 kg    Scheduled Meds:  acetaminophen   1,000 mg Oral TID   amoxicillin   1,000 mg Oral Q8H   enoxaparin  (LOVENOX ) injection  40 mg Subcutaneous Q24H   escitalopram  20 mg Oral Daily   gabapentin  300 mg Oral TID   insulin  aspart  0-5 Units Subcutaneous QHS   insulin  aspart  0-9 Units  Subcutaneous TID WC   insulin  aspart  5 Units Subcutaneous TID WC   insulin  glargine-yfgn  45 Units Subcutaneous Daily   losartan   25 mg Oral Daily   methadone   70 mg Oral q AM   And   methadone   40 mg Oral QPM   methocarbamol  500 mg Oral TID   Continuous Infusions:  Nutritional status     Body mass index is 25.06 kg/m.  Data Reviewed:   CBC: Recent Labs  Lab 11/30/23 0743 12/01/23 0403 12/02/23 0611 12/03/23 0520  WBC 6.7 7.4 5.0 7.6  NEUTROABS 4.3 5.0  --   --   HGB 11.6* 11.9* 11.4* 13.8  HCT 35.3* 35.2* 34.4* 41.7  MCV 89.4 89.1 88.7 88.2  PLT 284 281 268 267   Basic Metabolic Panel: Recent Labs  Lab 11/30/23 0743 12/01/23 0626  12/01/23 2341 12/02/23 0611 12/03/23 0520  NA 130* 135  --  134* 133*  K 4.3 3.8  --  4.0 4.2  CL 94* 100  --  99 95*  CO2 25 25  --  27 20*  GLUCOSE 606* 221*  --  248* 334*  BUN 17 10  --  16 17  CREATININE 0.91 0.74  --  0.85 0.83  CALCIUM 8.3* 7.9*  --  8.4* 8.9  MG  --   --  1.9  --   --    GFR: Estimated Creatinine Clearance: 92.9 mL/min (by C-G formula based on SCr of 0.83 mg/dL). Liver Function Tests: No results for input(s): AST, ALT, ALKPHOS, BILITOT, PROT, ALBUMIN in the last 168 hours. No results for input(s): LIPASE, AMYLASE in the last 168 hours. No results for input(s): AMMONIA in the last 168 hours. Coagulation Profile: No results for input(s): INR, PROTIME in the last 168 hours. Cardiac Enzymes: No results for input(s): CKTOTAL, CKMB, CKMBINDEX, TROPONINI in the last 168 hours. BNP (last 3 results) No results for input(s): PROBNP in the last 8760 hours. HbA1C: No results for input(s): HGBA1C in the last 72 hours. CBG: Recent Labs  Lab 12/02/23 1958 12/02/23 2334 12/03/23 0337 12/03/23 0726 12/03/23 0828  GLUCAP 138* 345* 367* 338* 267*   Lipid Profile: No results for input(s): CHOL, HDL, LDLCALC, TRIG, CHOLHDL, LDLDIRECT in the last 72 hours. Thyroid Function Tests: No results for input(s): TSH, T4TOTAL, FREET4, T3FREE, THYROIDAB in the last 72 hours. Anemia Panel: No results for input(s): VITAMINB12, FOLATE, FERRITIN, TIBC, IRON, RETICCTPCT in the last 72 hours. Sepsis Labs: No results for input(s): PROCALCITON, LATICACIDVEN in the last 168 hours.  Recent Results (from the past 240 hours)  Blood culture (routine x 2)     Status: None (Preliminary result)   Collection Time: 11/30/23  7:43 AM   Specimen: BLOOD RIGHT FOREARM  Result Value Ref Range Status   Specimen Description BLOOD RIGHT FOREARM  Final   Special Requests   Final    BOTTLES DRAWN AEROBIC AND ANAEROBIC Blood  Culture results may not be optimal due to an inadequate volume of blood received in culture bottles   Culture   Final    NO GROWTH 3 DAYS Performed at [facility], [address], [place], [place] [address]    Report Status PENDING  Incomplete  Aerobic Culture w Gram Stain (superficial specimen)     Status: None   Collection Time: 11/30/23  8:55 AM   Specimen: Wound  Result Value Ref Range Status   Specimen Description WOUND  Final   Special Requests NONE  Final   Gram Stain   Final    RARE WBC SEEN MODERATE GRAM POSITIVE COCCI Performed at [facility], [address] [place], [place] [address]    Culture MODERATE STREPTOCOCCUS CONSTELLATUS  Final   Report Status 12/02/2023 FINAL  Final   Organism ID, Bacteria STREPTOCOCCUS CONSTELLATUS  Final      Susceptibility   Streptococcus constellatus - MIC*    PENICILLIN <=0.06 SENSITIVE Sensitive     CEFTRIAXONE  0.25 SENSITIVE Sensitive     ERYTHROMYCIN >=8 RESISTANT Resistant     LEVOFLOXACIN <=0.25 SENSITIVE Sensitive     VANCOMYCIN  0.5 SENSITIVE Sensitive     * MODERATE STREPTOCOCCUS CONSTELLATUS  Aerobic/Anaerobic Culture w Gram Stain (surgical/deep wound)     Status: None (Preliminary result)   Collection Time: 11/30/23 12:38 PM   Specimen: Path fluid; Body Fluid  Result Value Ref Range Status   Specimen Description FLUID  Final   Special Requests RIGHT FOREARM ABSCESS  Final   Gram Stain   Final    FEW WBC PRESENT, PREDOMINANTLY PMN MODERATE GRAM POSITIVE COCCI Performed at [facility], [address], [place], [place] [address]    Culture   Final    MODERATE STREPTOCOCCUS CONSTELLATUS SUSCEPTIBILITIES PERFORMED ON PREVIOUS CULTURE WITHIN THE LAST 5 DAYS. NO ANAEROBES ISOLATED; CULTURE IN PROGRESS FOR 5 DAYS    Report Status PENDING  Incomplete  Blood culture (routine x 2)     Status: None (Preliminary result)   Collection Time: 11/30/23  4:37 PM   Specimen: BLOOD LEFT HAND  Result Value Ref Range  Status   Specimen Description BLOOD LEFT HAND  Final   Special Requests   Final    BOTTLES DRAWN AEROBIC ONLY Blood Culture results may not be optimal due to an inadequate volume of blood received in culture bottles   Culture   Final    NO GROWTH 3 DAYS Performed at [facility], [address], [place], [place] [address]    Report Status PENDING  Incomplete         Radiology Studies: No results found.         LOS: 3 days   Time spent= 35 mins    Burgess JAYSON Dare, MD [facility]  If 7PM-7AM, please contact night-coverage  12/03/2023, 12:20 PM

## 2023-12-03 NOTE — Progress Notes (Signed)
   12/03/23 1059  Assess: MEWS Score  Temp 97.7 F (36.5 C)  BP (!) 126/92  MAP (mmHg) 104  Pulse Rate (!) 125  Resp 17  Level of Consciousness Alert  SpO2 99 %  Assess: MEWS Score  MEWS Temp 0  MEWS Systolic 0  MEWS Pulse 2  MEWS RR 0  MEWS LOC 0  MEWS Score 2  MEWS Score Color Yellow  Assess: if the MEWS score is Yellow or Red  Were vital signs accurate and taken at a resting state? Yes  Does the patient meet 2 or more of the SIRS criteria? No  MEWS guidelines implemented  Yes, yellow  Treat  MEWS Interventions Considered administering scheduled or prn medications/treatments as ordered  Take Vital Signs  Increase Vital Sign Frequency  Yellow: Q2hr x1, continue Q4hrs until patient remains green for 12hrs  Escalate  MEWS: Escalate Yellow: Discuss with charge nurse and consider notifying provider and/or RRT  Notify: Charge Nurse/RN  Name of Charge Nurse/RN Notified Ben,RN  Provider Notification  Provider Name/Title Amin,MD  Date Provider Notified 12/03/23  Time Provider Notified 1108  Method of Notification Page  Notification Reason Change in status (yellow mews)  Provider response Other (Comment) (waiting new orders)  Date of Provider Response 12/03/23  Assess: SIRS CRITERIA  SIRS Temperature  0  SIRS Respirations  0  SIRS Pulse 1  SIRS WBC 0  SIRS Score Sum  1

## 2023-12-03 NOTE — Progress Notes (Signed)
 Amin,MD made aware pt blood pressure is 174/103. Waiting for orders

## 2023-12-03 NOTE — Progress Notes (Signed)
    [facility]    Date of Admission:  11/30/2023   Total days of antibiotics 4   ID: Anna Dennis is a 33 y.o. female with   Principal Problem:   Abscess of skin of right wrist    Subjective: Afebrile.   Medications:   acetaminophen   1,000 mg Oral TID   amoxicillin   1,000 mg Oral Q8H   enoxaparin  (LOVENOX ) injection  40 mg Subcutaneous Q24H   escitalopram  20 mg Oral Daily   gabapentin  300 mg Oral TID   insulin  aspart  0-5 Units Subcutaneous QHS   insulin  aspart  0-9 Units Subcutaneous TID WC   insulin  aspart  5 Units Subcutaneous TID WC   insulin  glargine-yfgn  45 Units Subcutaneous Daily   losartan   25 mg Oral Daily   methadone   70 mg Oral q AM   And   methadone   40 mg Oral QPM   methocarbamol  500 mg Oral TID    Objective: Vital signs in last 24 hours: Temp:  [97.7 F (36.5 C)-98.2 F (36.8 C)] 97.7 F (36.5 C) (11/05 1059) Pulse Rate:  [86-125] 96 (11/05 1300) Resp:  [17-18] 17 (11/05 1059) BP: (126-174)/(89-103) 137/90 (11/05 1300) SpO2:  [98 %-100 %] 100 % (11/05 1300)  Physical Exam  Constitutional:  oriented to person, place, and time. appears well-developed and well-nourished. No distress.  HENT: [place]/AT, PERRLA, no scleral icterus Mouth/Throat: Oropharynx is clear and moist. No oropharyngeal exudate.  Cardiovascular: Normal rate, regular rhythm and normal heart sounds. Exam reveals no gallop and no friction rub.  No murmur heard.  Pulmonary/Chest: Effort normal and breath sounds normal. No respiratory distress.  has no wheezes.  Neck = supple, no nuchal rigidity Zku:mphyu warm wrapped Skin: Skin is warm and dry. No rash noted. No erythema.  Psychiatric: a normal mood and affect.  behavior is normal. Slow speech   Lab Results Recent Labs    12/02/23 0611 12/03/23 0520  WBC 5.0 7.6  HGB 11.4* 13.8  HCT 34.4* 41.7  NA 134* 133*  K 4.0 4.2  CL 99 95*  CO2 27 20*  BUN 16 17  CREATININE 0.85 0.83    Sedimentation  Rate Recent Labs    12/02/23 0611  ESRSEDRATE 24*   C-Reactive Protein Recent Labs    12/02/23 0611  CRP 4.8*    Microbiology: Streptococcus constellatus      MIC    CEFTRIAXONE  0.25 SENSIT... Sensitive    ERYTHROMYCIN >=8 RESISTANT Resistant    LEVOFLOXACIN <=0.25 SENS... Sensitive    PENICILLIN <=0.06 SENS... Sensitive    VANCOMYCIN  0.5 SENSITIVE Sensitive    Studies/Results: No results found.   Assessment/Plan: Streptococcal deep tissue infection and tendonitis s/p I xD of right wrist= strep isolate sensitive to penicillin. Will switch to high dose amoxicillin . Plan to start amox 1gm TID for a total of 3 weeks  Continue with wound care recommendations and follow up with hand surgery  Pain management = continue to manage per primary team. Also she is on methadone  as a chronic medication  Health maintenance = will check rpr  Will sign off.  evaluation of this patient requires complex antimicrobial therapy evaluation and counseling and isolation needs for disease transmission risk assessment and mitigation.    [facility] Pager: [phone]  12/03/2023, 2:52 PM

## 2023-12-04 DIAGNOSIS — L02413 Cutaneous abscess of right upper limb: Secondary | ICD-10-CM | POA: Diagnosis not present

## 2023-12-04 LAB — CBC
HCT: 41.4 % (ref 36.0–46.0)
Hemoglobin: 13.6 g/dL (ref 12.0–15.0)
MCH: 28.9 pg (ref 26.0–34.0)
MCHC: 32.9 g/dL (ref 30.0–36.0)
MCV: 87.9 fL (ref 80.0–100.0)
Platelets: 491 K/uL — ABNORMAL HIGH (ref 150–400)
RBC: 4.71 MIL/uL (ref 3.87–5.11)
RDW: 12.5 % (ref 11.5–15.5)
WBC: 7.8 K/uL (ref 4.0–10.5)
nRBC: 0 % (ref 0.0–0.2)

## 2023-12-04 LAB — BASIC METABOLIC PANEL WITH GFR
Anion gap: 16 — ABNORMAL HIGH (ref 5–15)
BUN: 18 mg/dL (ref 6–20)
CO2: 16 mmol/L — ABNORMAL LOW (ref 22–32)
Calcium: 9.2 mg/dL (ref 8.9–10.3)
Chloride: 98 mmol/L (ref 98–111)
Creatinine, Ser: 0.99 mg/dL (ref 0.44–1.00)
GFR, Estimated: >60 mL/min (ref >60)
Glucose, Bld: 391 mg/dL — ABNORMAL HIGH (ref 70–99)
Potassium: 5.5 mmol/L — ABNORMAL HIGH (ref 3.5–5.1)
Sodium: 130 mmol/L — ABNORMAL LOW (ref 135–145)

## 2023-12-04 LAB — GLUCOSE, CAPILLARY
Glucose-Capillary: 145 mg/dL — ABNORMAL HIGH (ref 70–99)
Glucose-Capillary: 218 mg/dL — ABNORMAL HIGH (ref 70–99)
Glucose-Capillary: 276 mg/dL — ABNORMAL HIGH (ref 70–99)
Glucose-Capillary: 363 mg/dL — ABNORMAL HIGH (ref 70–99)
Glucose-Capillary: 88 mg/dL (ref 70–99)

## 2023-12-04 LAB — RPR: RPR Ser Ql: NONREACTIVE

## 2023-12-04 LAB — HEPATITIS B CORE ANTIBODY, TOTAL: HEP B CORE AB: NEGATIVE

## 2023-12-04 MED ORDER — ESCITALOPRAM OXALATE 20 MG PO TABS
30.0000 mg | ORAL_TABLET | Freq: Every day | ORAL | Status: DC
  Administered 2023-12-05: 30 mg via ORAL
  Filled 2023-12-04: qty 1

## 2023-12-04 MED ORDER — INSULIN ASPART 100 UNIT/ML IJ SOLN
7.0000 [IU] | Freq: Three times a day (TID) | INTRAMUSCULAR | Status: DC
  Filled 2023-12-04: qty 7

## 2023-12-04 MED ORDER — SODIUM ZIRCONIUM CYCLOSILICATE 10 G PO PACK
10.0000 g | PACK | Freq: Three times a day (TID) | ORAL | Status: AC
  Administered 2023-12-04 (×3): 10 g via ORAL
  Filled 2023-12-04 (×3): qty 1

## 2023-12-04 MED ORDER — NICOTINE 21 MG/24HR TD PT24
21.0000 mg | MEDICATED_PATCH | Freq: Every day | TRANSDERMAL | Status: DC
  Administered 2023-12-04 – 2023-12-05 (×2): 21 mg via TRANSDERMAL
  Filled 2023-12-04 (×2): qty 1

## 2023-12-04 MED ORDER — INSULIN GLARGINE-YFGN 100 UNIT/ML ~~LOC~~ SOLN
50.0000 [IU] | Freq: Every day | SUBCUTANEOUS | Status: DC
  Administered 2023-12-04 – 2023-12-05 (×2): 50 [IU] via SUBCUTANEOUS
  Filled 2023-12-04 (×2): qty 0.5

## 2023-12-04 NOTE — Progress Notes (Signed)
 PROGRESS NOTE    Anna Dennis  FMW:[id number] DOB: Feb 12, 1990 DOA: 11/30/2023 PCP: Oley Bascom RAMAN, NP    Brief Narrative:  33 y.o. 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  Upon admission noted to have right wrist complex abscess requiring I&D on 11/2.  Hospital course also complicated by hyperglycemia.  Currently followed by infectious disease team.   Assessment & Plan:   R wrist abscess, Streptococcus -This is in the setting of IV drug abuse.  Status post I&D by orthopedic on 11/2.  MRI of the right wrist reviewed.  Seen by ID, culture data reviewed and recommending amoxicillin  1 g 3 times daily for 3 weeks. Ortho recommends dressing changes for now and will eval tomorrow.    DM1, uncontrolled due to hyperglycemia - A1c 10.6.  Seen by diabetic coordinator.  Will adjust long-acting and Premeal insulin  as needed.  Continue sliding scale.Will need outptn Endocrine referral.   Uncontrolled blood pressure - Start losartan  25 mg.  IV as needed   H/o IVDU -Methadone  150mg  daily.  Counseled to quit using this  OCD/depression - Reports she is on daily Lexapro  DVT prophylaxis: Lovenox     Code Status: Full Code Family Communication:   Status is: Inpatient Remains inpatient appropriate because: Discharge when cleared by orthopedic; likely tomorrow per Dr Teresa PT Follow up Recs:   Subjective: Doing ok. No new complaints.    Examination:  General exam: Appears calm and comfortable  Respiratory system: Clear to auscultation. Respiratory effort normal. Cardiovascular system: S1 & S2 heard, RRR. No JVD, murmurs, rubs, gallops or clicks. No pedal edema. Gastrointestinal system: Abdomen is nondistended, soft and nontender. No organomegaly or masses felt. Normal bowel sounds heard. Central nervous system: Alert and oriented. No focal neurological  deficits. Extremities: Symmetric 5 x 5 power. Skin: No rashes, lesions or ulcers Psychiatry: Judgement and insight appear normal. Mood & affect appropriate.                Diet Orders (From admission, onward)     Start     Ordered   11/30/23 1648  Diet Carb Modified Fluid consistency: Thin; Room service appropriate? Yes  Diet effective now       Question Answer Comment  Diet-HS Snack? Nothing   Calorie Level Medium 1600-2000   Fluid consistency: Thin   Room service appropriate? Yes      11/30/23 1648            Objective: Vitals:   12/03/23 1838 12/03/23 2127 12/04/23 0610 12/04/23 0821  BP: (!) 154/94 (!) 124/98 (!) 156/94 (!) 139/97  Pulse: (!) 110 (!) 108 (!) 109 (!) 102  Resp: 17 17 17 16   Temp: 98 F (36.7 C) 98.2 F (36.8 C) 97.7 F (36.5 C) 98.4 F (36.9 C)  TempSrc: Oral Oral Oral Oral  SpO2: 100% 98% 99% 100%  Weight:      Height:        Intake/Output Summary (Last 24 hours) at 12/04/2023 1047 Last data filed at 12/03/2023 1530 Gross per 24 hour  Intake 480 ml  Output --  Net 480 ml   Filed Weights   11/30/23 0338 11/30/23 1129  Weight: 72.6 kg 72.6 kg    Scheduled Meds:  acetaminophen   1,000 mg Oral TID   amoxicillin   1,000 mg Oral Q8H   enoxaparin  (LOVENOX ) injection  40 mg Subcutaneous Q24H   [  START ON 12/05/2023] escitalopram  30 mg Oral Daily   gabapentin  300 mg Oral TID   insulin  aspart  0-5 Units Subcutaneous QHS   insulin  aspart  0-9 Units Subcutaneous TID WC   insulin  aspart  7 Units Subcutaneous TID WC   insulin  glargine-yfgn  50 Units Subcutaneous Daily   losartan   25 mg Oral Daily   methadone   70 mg Oral q AM   And   methadone   40 mg Oral QPM   methocarbamol  500 mg Oral TID   nicotine   21 mg Transdermal Daily   sodium zirconium cyclosilicate  10 g Oral TID   Continuous Infusions:  Nutritional status     Body mass index is 25.06 kg/m.  Data Reviewed:   CBC: Recent Labs  Lab 11/30/23 0743 12/01/23 0403  12/02/23 0611 12/03/23 0520 12/04/23 0542  WBC 6.7 7.4 5.0 7.6 7.8  NEUTROABS 4.3 5.0  --   --   --   HGB 11.6* 11.9* 11.4* 13.8 13.6  HCT 35.3* 35.2* 34.4* 41.7 41.4  MCV 89.4 89.1 88.7 88.2 87.9  PLT 284 281 268 267 491*   Basic Metabolic Panel: Recent Labs  Lab 11/30/23 0743 12/01/23 0626 12/01/23 2341 12/02/23 0611 12/03/23 0520 12/04/23 0426  NA 130* 135  --  134* 133* 130*  K 4.3 3.8  --  4.0 4.2 5.5*  CL 94* 100  --  99 95* 98  CO2 25 25  --  27 20* 16*  GLUCOSE 606* 221*  --  248* 334* 391*  BUN 17 10  --  16 17 18   CREATININE 0.91 0.74  --  0.85 0.83 0.99  CALCIUM 8.3* 7.9*  --  8.4* 8.9 9.2  MG  --   --  1.9  --   --   --    GFR: Estimated Creatinine Clearance: 77.9 mL/min (by C-G formula based on SCr of 0.99 mg/dL). Liver Function Tests: No results for input(s): AST, ALT, ALKPHOS, BILITOT, PROT, ALBUMIN in the last 168 hours. No results for input(s): LIPASE, AMYLASE in the last 168 hours. No results for input(s): AMMONIA in the last 168 hours. Coagulation Profile: No results for input(s): INR, PROTIME in the last 168 hours. Cardiac Enzymes: No results for input(s): CKTOTAL, CKMB, CKMBINDEX, TROPONINI in the last 168 hours. BNP (last 3 results) No results for input(s): PROBNP in the last 8760 hours. HbA1C: No results for input(s): HGBA1C in the last 72 hours. CBG: Recent Labs  Lab 12/03/23 1221 12/03/23 1658 12/03/23 2033 12/04/23 0003 12/04/23 0820  GLUCAP 356* 70 106* 276* 363*   Lipid Profile: No results for input(s): CHOL, HDL, LDLCALC, TRIG, CHOLHDL, LDLDIRECT in the last 72 hours. Thyroid Function Tests: No results for input(s): TSH, T4TOTAL, FREET4, T3FREE, THYROIDAB in the last 72 hours. Anemia Panel: No results for input(s): VITAMINB12, FOLATE, FERRITIN, TIBC, IRON, RETICCTPCT in the last 72 hours. Sepsis Labs: No results for input(s): PROCALCITON, LATICACIDVEN in  the last 168 hours.  Recent Results (from the past 240 hours)  Blood culture (routine x 2)     Status: None (Preliminary result)   Collection Time: 11/30/23  7:43 AM   Specimen: BLOOD RIGHT FOREARM  Result Value Ref Range Status   Specimen Description BLOOD RIGHT FOREARM  Final   Special Requests   Final    BOTTLES DRAWN AEROBIC AND ANAEROBIC Blood Culture results may not be optimal due to an inadequate volume of blood received in culture bottles   Culture  Final    NO GROWTH 4 DAYS Performed at [facility], [address] [place], [place] [address]    Report Status PENDING  Incomplete  Aerobic Culture w Gram Stain (superficial specimen)     Status: None   Collection Time: 11/30/23  8:55 AM   Specimen: Wound  Result Value Ref Range Status   Specimen Description WOUND  Final   Special Requests NONE  Final   Gram Stain   Final    RARE WBC SEEN MODERATE GRAM POSITIVE COCCI Performed at [facility], [address] [place], [place] [address]    Culture MODERATE STREPTOCOCCUS CONSTELLATUS  Final   Report Status 12/02/2023 FINAL  Final   Organism ID, Bacteria STREPTOCOCCUS CONSTELLATUS  Final      Susceptibility   Streptococcus constellatus - MIC*    PENICILLIN <=0.06 SENSITIVE Sensitive     CEFTRIAXONE  0.25 SENSITIVE Sensitive     ERYTHROMYCIN >=8 RESISTANT Resistant     LEVOFLOXACIN <=0.25 SENSITIVE Sensitive     VANCOMYCIN  0.5 SENSITIVE Sensitive     * MODERATE STREPTOCOCCUS CONSTELLATUS  Aerobic/Anaerobic Culture w Gram Stain (surgical/deep wound)     Status: None (Preliminary result)   Collection Time: 11/30/23 12:38 PM   Specimen: Path fluid; Body Fluid  Result Value Ref Range Status   Specimen Description FLUID  Final   Special Requests RIGHT FOREARM ABSCESS  Final   Gram Stain   Final    FEW WBC PRESENT, PREDOMINANTLY PMN MODERATE GRAM POSITIVE COCCI Performed at [facility], [address], [place], [place] [address]    Culture   Final     MODERATE STREPTOCOCCUS CONSTELLATUS SUSCEPTIBILITIES PERFORMED ON PREVIOUS CULTURE WITHIN THE LAST 5 DAYS. NO ANAEROBES ISOLATED; CULTURE IN PROGRESS FOR 5 DAYS    Report Status PENDING  Incomplete  Blood culture (routine x 2)     Status: None (Preliminary result)   Collection Time: 11/30/23  4:37 PM   Specimen: BLOOD LEFT HAND  Result Value Ref Range Status   Specimen Description BLOOD LEFT HAND  Final   Special Requests   Final    BOTTLES DRAWN AEROBIC ONLY Blood Culture results may not be optimal due to an inadequate volume of blood received in culture bottles   Culture   Final    NO GROWTH 4 DAYS Performed at [facility], [address], [place], [place] [address]    Report Status PENDING  Incomplete         Radiology Studies: No results found.         LOS: 4 days   Time spent= 35 mins    Burgess JAYSON Dare, MD [facility]  If 7PM-7AM, please contact night-coverage  12/04/2023, 10:47 AM

## 2023-12-04 NOTE — Plan of Care (Signed)
  Problem: Education: Goal: Ability to describe self-care measures that may prevent or decrease complications (Diabetes Survival Skills Education) will improve Outcome: Progressing   Problem: Coping: Goal: Ability to adjust to condition or change in health will improve Outcome: Progressing   Problem: Nutritional: Goal: Maintenance of adequate nutrition will improve Outcome: Progressing   Problem: Skin Integrity: Goal: Risk for impaired skin integrity will decrease Outcome: Progressing   Problem: Education: Goal: Knowledge of General Education information will improve Description: Including pain rating scale, medication(s)/side effects and non-pharmacologic comfort measures Outcome: Progressing   

## 2023-12-04 NOTE — Progress Notes (Signed)
 RN making patient rounds, found patient asleep, unable to answer questions while sitting on the recliner. Vital signs checked, MD notified to order telesitter.  Telesitter in place, Security called to search the room and belongings.

## 2023-12-04 NOTE — Plan of Care (Signed)
  Problem: Coping: Goal: Ability to adjust to condition or change in health will improve Outcome: Progressing   Problem: Nutritional: Goal: Maintenance of adequate nutrition will improve Outcome: Progressing   Problem: Activity: Goal: Risk for activity intolerance will decrease Outcome: Progressing   Problem: Metabolic: Goal: Ability to maintain appropriate glucose levels will improve Outcome: Not Progressing

## 2023-12-04 NOTE — Consult Note (Signed)
 WOC Nurse Consult Note:  WOC consult performed remotely utilizing imaging and chart review Reason for Consult: Per Ortho, Dr Teresa, Wet to dry dressings BID while here  Wound type: full thickness wound to R wrist post I & D for abscess Pressure Injury POA: NA Measurement: see nursing flow sheets Dressing procedure/placement/frequency:  Per attending MD, ortho is requesting a wet to dry dressing change BID while patient is in hospital.  Dicussed that this dressing is performed by bedside nursing.  MD to place appropriate orders.  WOC team will not consult at this time, please re consult if new needs arise.   Thank you,  Doyal Polite, MSN, RN, [facility] WOC Team [phone] (Available Mon-Fri 0700-1500)

## 2023-12-04 NOTE — Progress Notes (Signed)
   Subjective:  33 y.o. female now POD 4 from I&D of the right forearm abscess. Patient reports that her pain is controlled.  Otherwise, no acute events overnight. No numbness or tingling to the extremity. No fevers or chills  Objective:   VITALS:   Vitals:   12/04/23 0821 12/04/23 1604 12/04/23 1707 12/04/23 2128  BP: (!) 139/97 118/75 (!) 127/97 130/88  Pulse: (!) 102 86 88 87  Resp: 16 18 16 17   Temp: 98.4 F (36.9 C) (!) 97.5 F (36.4 C) 97.7 F (36.5 C) 98 F (36.7 C)  TempSrc: Oral Oral Oral Oral  SpO2: 100%  100% 100%  Weight:      Height:        Physical Exam: General: Alert, no acute distress Cardiovascular: No pedal edema Respiratory: No cyanosis, no use of accessory musculature GI: No organomegaly, abdomen is soft and non-tender Skin: No lesions in the area of chief complaint Neurologic: Sensation intact distally Psychiatric: Patient is competent for consent with normal mood and affect Lymphatic: No axillary or cervical lymphadenopathy  MUSCULOSKELETAL: Right upper extremity - area of skin necrosis now with fibrinous tissue in the wound bed - No frank purulence from wound - TTP volar right wrist - painless wrist ROM - Motor intact to median, radial and ulnar nerve distributions - Sensation intact to light touch median, radial, and ulnar nerve distributions - 2+ radial pulse  Lab Results  Component Value Date   WBC 7.8 12/04/2023   HGB 13.6 12/04/2023   HCT 41.4 12/04/2023   MCV 87.9 12/04/2023   PLT 491 (H) 12/04/2023   BMET    Component Value Date/Time   NA 130 (L) 12/04/2023 0426   NA 138 05/02/2023 1510   K 5.5 (H) 12/04/2023 0426   CL 98 12/04/2023 0426   CO2 16 (L) 12/04/2023 0426   GLUCOSE 391 (H) 12/04/2023 0426   BUN 18 12/04/2023 0426   BUN 14 05/02/2023 1510   CREATININE 0.99 12/04/2023 0426   CREATININE 0.77 12/29/2014 1421   CALCIUM 9.2 12/04/2023 0426   EGFR 107 05/02/2023 1510   GFRNONAA >60 12/04/2023 0426   GFRNONAA >89  12/29/2014 1421     Assessment/Plan: 33 y.o. female  now 4 Days Post-Op from Principal Problem:   Abscess of skin of right wrist .  Patient is doing well, she has been transitioned to PO amoxicillin  per ID to treat her strep constellatus infection. The patient's packing was removed today and she should have at least BID wet to dry packing changes with wound care and will require home health for wound care wet to dry dressing changes on discharge. She is to follow up with one of my hand partners in 1 week from discharge for evaluation of her forearm wound.  At this time, she is planned to be treated with healing by second intention, but possible future skin graft cannot be excluded.  Strict blood glucose control and avoiding drug use was stressed with the patient to prevent worsening of her infection.  Weight bearing: NWB RUE in soft dressings Pain control: per primary Daily wound care outpatient Continue amoxicillin  PO x 3 weeks per ID Dispo: Ok for discharge in the am with follow up with my hand partner, Prentice Pagan MD   [place] [facility] 12/04/2023, 10:08 PM

## 2023-12-04 NOTE — Progress Notes (Signed)
 RN unable to give wound care education to patient, due to patient being drowsy.

## 2023-12-05 ENCOUNTER — Other Ambulatory Visit (HOSPITAL_COMMUNITY): Payer: Self-pay

## 2023-12-05 DIAGNOSIS — L02413 Cutaneous abscess of right upper limb: Secondary | ICD-10-CM | POA: Diagnosis not present

## 2023-12-05 LAB — BASIC METABOLIC PANEL WITH GFR
Anion gap: 14 (ref 5–15)
BUN: 17 mg/dL (ref 6–20)
CO2: 25 mmol/L (ref 22–32)
Calcium: 9.1 mg/dL (ref 8.9–10.3)
Chloride: 99 mmol/L (ref 98–111)
Creatinine, Ser: 0.82 mg/dL (ref 0.44–1.00)
GFR, Estimated: >60 mL/min (ref >60)
Glucose, Bld: 57 mg/dL — ABNORMAL LOW (ref 70–99)
Potassium: 3.9 mmol/L (ref 3.5–5.1)
Sodium: 138 mmol/L (ref 135–145)

## 2023-12-05 LAB — CULTURE, BLOOD (ROUTINE X 2)
Culture: NO GROWTH
Culture: NO GROWTH

## 2023-12-05 LAB — AEROBIC/ANAEROBIC CULTURE W GRAM STAIN (SURGICAL/DEEP WOUND)

## 2023-12-05 LAB — CBC
HCT: 40.5 % (ref 36.0–46.0)
Hemoglobin: 13.2 g/dL (ref 12.0–15.0)
MCH: 28.8 pg (ref 26.0–34.0)
MCHC: 32.6 g/dL (ref 30.0–36.0)
MCV: 88.4 fL (ref 80.0–100.0)
Platelets: 438 K/uL — ABNORMAL HIGH (ref 150–400)
RBC: 4.58 MIL/uL (ref 3.87–5.11)
RDW: 12.4 % (ref 11.5–15.5)
WBC: 5.2 K/uL (ref 4.0–10.5)
nRBC: 0 % (ref 0.0–0.2)

## 2023-12-05 LAB — MAGNESIUM: Magnesium: 2 mg/dL (ref 1.7–2.4)

## 2023-12-05 LAB — GLUCOSE, CAPILLARY: Glucose-Capillary: 70 mg/dL (ref 70–99)

## 2023-12-05 MED ORDER — AMOXICILLIN 500 MG PO CAPS
1000.0000 mg | ORAL_CAPSULE | Freq: Three times a day (TID) | ORAL | 0 refills | Status: AC
  Filled 2023-12-05: qty 102, 17d supply, fill #0

## 2023-12-05 MED ORDER — NICOTINE 21 MG/24HR TD PT24
21.0000 mg | MEDICATED_PATCH | Freq: Every day | TRANSDERMAL | 0 refills | Status: AC
  Filled 2023-12-05: qty 28, 28d supply, fill #0

## 2023-12-05 MED ORDER — MAGIC MOUTHWASH W/LIDOCAINE: 5.0000 mL | Freq: Four times a day (QID) | ORAL | Status: DC | PRN

## 2023-12-05 MED ORDER — INSULIN PEN NEEDLE 32G X 4 MM MISC
1.0000 | Freq: Every day | 0 refills | Status: DC
  Filled 2023-12-05: qty 100, 90d supply, fill #0

## 2023-12-05 MED ORDER — LOSARTAN POTASSIUM 25 MG PO TABS
25.0000 mg | ORAL_TABLET | Freq: Every day | ORAL | 0 refills | Status: DC
  Filled 2023-12-05: qty 90, 90d supply, fill #0

## 2023-12-05 MED ORDER — BASAGLAR KWIKPEN 100 UNIT/ML ~~LOC~~ SOPN
50.0000 [IU] | PEN_INJECTOR | Freq: Every day | SUBCUTANEOUS | 0 refills | Status: DC
  Filled 2023-12-05: qty 15, 30d supply, fill #0

## 2023-12-05 MED ORDER — SENNA 8.6 MG PO TABS
2.0000 | ORAL_TABLET | Freq: Two times a day (BID) | ORAL | 0 refills | Status: AC | PRN
  Filled 2023-12-05: qty 120, 30d supply, fill #0

## 2023-12-05 MED ORDER — ESCITALOPRAM OXALATE 10 MG PO TABS
30.0000 mg | ORAL_TABLET | Freq: Every day | ORAL | 0 refills | Status: DC
  Filled 2023-12-05: qty 30, 10d supply, fill #0
  Filled 2023-12-05: qty 90, 30d supply, fill #0

## 2023-12-05 MED ORDER — GABAPENTIN 300 MG PO CAPS
300.0000 mg | ORAL_CAPSULE | Freq: Three times a day (TID) | ORAL | 0 refills | Status: AC
  Filled 2023-12-05: qty 90, 30d supply, fill #0

## 2023-12-05 MED ORDER — OXYCODONE HCL 5 MG PO TABS
5.0000 mg | ORAL_TABLET | Freq: Three times a day (TID) | ORAL | 0 refills | Status: AC | PRN
  Filled 2023-12-05: qty 15, 5d supply, fill #0

## 2023-12-05 NOTE — Plan of Care (Signed)
  Problem: Education: Goal: Ability to describe self-care measures that may prevent or decrease complications (Diabetes Survival Skills Education) will improve Outcome: Progressing   Problem: Coping: Goal: Ability to adjust to condition or change in health will improve Outcome: Progressing

## 2023-12-05 NOTE — Discharge Summary (Signed)
 Physician Discharge Summary  Anna Dennis FMW:[id number] DOB: [birth year] DOA: 11/30/2023  PCP: Oley Bascom RAMAN, NP  Admit date: 11/30/2023 Discharge date: 12/05/2023  Admitted From: Home Disposition: Home  Recommendations for Outpatient Follow-up:  Follow up with PCP in 1-2 weeks Please obtain BMP/CBC in one week your next doctors visit.  Amoxicillin  1 g 3 times daily for 17 more days Lexapro 30 mg orally daily.  Follow-up outpatient Pain medication, gabapentin prescribed with pain regimen Outpatient follow-up with hand surgery/orthopedic in 1 week to be arranged by their service Nicotine  patch prescribed Counseled to quit using illicit drugs  Discharge Condition: Stable CODE STATUS: Full code Diet recommendation: Diabetic  Brief/Interim Summary: Brief Narrative:  33 y.o. 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  Upon admission noted to have right wrist complex abscess requiring I&D on 11/2.  Hospital course also complicated by hyperglycemia.  Currently followed by infectious disease team. Overall patient is doing well today therefore will need to complete 3 weeks of total amoxicillin  1 g 3 times daily.  Outpatient follow-up with orthopedic to be arranged by their service.  Recommend dailyWet-to-dry dressing per orthopedic.   Assessment & Plan:   R wrist abscess, Streptococcus -This is in the setting of IV drug abuse.  Status post I&D by orthopedic on 11/2.  MRI of the right wrist reviewed.  Seen by ID, culture data reviewed and recommending amoxicillin  1 g 3 times daily for 3 weeks. Ortho recommends dressing changes for now and will eval tomorrow.    DM1, uncontrolled due to hyperglycemia - A1c 10.6.  Seen by diabetic coordinator.  Will adjust long-acting and Premeal insulin  as needed.  Continue sliding scale.Will need outptn Endocrine referral.    Uncontrolled blood pressure - Start losartan  25 mg.  Low-salt diet, follow-up outpatient   H/o IVDU -Methadone  150mg  daily.  Counseled to quit using this  OCD/depression - Reports she is on daily Lexapro  DVT prophylaxis: Lovenox     Code Status: Full Code Family Communication:   Status is: Inpatient Discharge PT Follow up Recs:   Subjective: Doing ok. No new complaints.  Yesterday there was concern about possible illicit activity in her room when she had a visitor therefore had to be placed on telemetry sitter.  Examination:  General exam: Appears calm and comfortable  Respiratory system: Clear to auscultation. Respiratory effort normal. Cardiovascular system: S1 & S2 heard, RRR. No JVD, murmurs, rubs, gallops or clicks. No pedal edema. Gastrointestinal system: Abdomen is nondistended, soft and nontender. No organomegaly or masses felt. Normal bowel sounds heard. Central nervous system: Alert and oriented. No focal neurological deficits. Extremities: Symmetric 5 x 5 power. Skin: No rashes, lesions or ulcers Psychiatry: Judgement and insight appear normal. Mood & affect appropriate.    Discharge Diagnoses:  Principal Problem:   Abscess of skin of right wrist      Discharge Exam: Vitals:   12/05/23 0508 12/05/23 0950  BP: 123/79 (!) 144/101  Pulse: 84 90  Resp: 17 16  Temp: 97.8 F (36.6 C)   SpO2: 98% 100%   Vitals:   12/04/23 1707 12/04/23 2128 12/05/23 0508 12/05/23 0950  BP: (!) 127/97 130/88 123/79 (!) 144/101  Pulse: 88 87 84 90  Resp: 16 17 17 16   Temp: 97.7 F (36.5 C) 98 F (36.7 C) 97.8 F (36.6 C)   TempSrc: Oral Oral Oral   SpO2: 100% 100%  98% 100%  Weight:      Height:          Discharge Instructions   Allergies as of 12/05/2023   No Known Allergies      Medication List     TAKE these medications    acetaminophen  500 MG tablet Commonly known as: TYLENOL  Take 1,000 mg by mouth every 6 (six) hours as needed (pain).    amoxicillin  500 MG capsule Commonly known as: AMOXIL  Take 2 capsules (1,000 mg total) by mouth every 8 (eight) hours for 17 days.   Basaglar  KwikPen 100 UNIT/ML Inject 50 Units into the skin at bedtime. What changed: how much to take   escitalopram 10 MG tablet Commonly known as: LEXAPRO Take 3 tablets (30 mg total) by mouth daily.   gabapentin 300 MG capsule Commonly known as: NEURONTIN Take 1 capsule (300 mg total) by mouth 3 (three) times daily.   hydrALAZINE 25 MG tablet Commonly known as: APRESOLINE Take 1 tablet (25 mg total) by mouth 3 (three) times daily.   ibuprofen  200 MG tablet Commonly known as: ADVIL  Take 400-800 mg by mouth every 6 (six) hours as needed (pain).   insulin  lispro 100 UNIT/ML KwikPen Commonly known as: HumaLOG  KwikPen Inject 4-30 Units into the skin 3 (three) times daily before meals.   losartan  25 MG tablet Commonly known as: COZAAR  Take 1 tablet (25 mg total) by mouth daily.   methadone  10 MG/ML solution Commonly known as: DOLOPHINE  Take 40-70 mg by mouth See admin instructions. Total daily dose of 110mg . Take 7mL (70mg ) by mouth every morning and take 4mL (40mg ) every afternoon.   MM Pen Needles 32G X 4 MM Misc Generic drug: Insulin  Pen Needle To be used with insulin  as instructed   nicotine  21 mg/24hr patch Commonly known as: NICODERM CQ  - dosed in mg/24 hours Place 1 patch (21 mg total) onto the skin daily.   oxyCODONE  5 MG immediate release tablet Commonly known as: Oxy IR/ROXICODONE  Take 1 tablet (5 mg total) by mouth every 8 (eight) hours as needed for severe pain (pain score 7-10).   senna 8.6 MG Tabs tablet Commonly known as: SENOKOT Take 2 tablets (17.2 mg total) by mouth 2 (two) times daily as needed for moderate constipation.        Follow-up Information     [facility] [place]. [facility] Follow up.   Specialty: Infectious Diseases Why: 12/19/2023 9:30 AM follow up to  check on antibiotic management with Dr. Luiz Pass information: [address] [place], [address] [place]  [address] [phone]        Shari Easter, MD Follow up in 1 week(s).   Specialty: Orthopedic Surgery Contact information: [address] [place] [address] [place] [address] [phone]                No Known Allergies  You were cared for by a hospitalist during your hospital stay. If you have any questions about your discharge medications or the care you received while you were in the hospital after you are discharged, you can call the unit and asked to speak with the hospitalist on call if the hospitalist that took care of you is not available. Once you are discharged, your primary care physician will handle any further medical issues. Please note that no refills for any discharge medications will be authorized once you are discharged, as it is imperative that you return to your  primary care physician (or establish a relationship with a primary care physician if you do not have one) for your aftercare needs so that they can reassess your need for medications and monitor your lab values.  You were cared for by a hospitalist during your hospital stay. If you have any questions about your discharge medications or the care you received while you were in the hospital after you are discharged, you can call the unit and asked to speak with the hospitalist on call if the hospitalist that took care of you is not available. Once you are discharged, your primary care physician will handle any further medical issues. Please note that NO REFILLS for any discharge medications will be authorized once you are discharged, as it is imperative that you return to your primary care physician (or establish a relationship with a primary care physician if you do not have one) for your aftercare needs so that they can reassess your need for medications and monitor your lab  values.  Please request your Prim.MD to go over all Hospital Tests and Procedure/Radiological results at the follow up, please get all Hospital records sent to your Prim MD by signing hospital release before you go home.  Get CBC, CMP, 2 view Chest X ray checked  by Primary MD during your next visit or SNF MD in 5-7 days ( we routinely change or add medications that can affect your baseline labs and fluid status, therefore we recommend that you get the mentioned basic workup next visit with your PCP, your PCP may decide not to get them or add new tests based on their clinical decision)  On your next visit with your primary care physician please Get Medicines reviewed and adjusted.  If you experience worsening of your admission symptoms, develop shortness of breath, life threatening emergency, suicidal or homicidal thoughts you must seek medical attention immediately by calling 911 or calling your MD immediately  if symptoms less severe.  You Must read complete instructions/literature along with all the possible adverse reactions/side effects for all the Medicines you take and that have been prescribed to you. Take any new Medicines after you have completely understood and accpet all the possible adverse reactions/side effects.   Do not drive, operate heavy machinery, perform activities at heights, swimming or participation in water activities or provide baby sitting services if your were admitted for syncope or siezures until you have seen by Primary MD or a Neurologist and advised to do so again.  Do not drive when taking Pain medications.   Procedures/Studies: MR WRIST RIGHT W WO CONTRAST Result Date: 12/01/2023 EXAM: MRI OF THE RIGHT WRIST WITH AND WITHOUT INTRAVENOUS CONTRAST 11/30/2023 10:29:00 PM TECHNIQUE: Multiplanar multisequence MRI of the right wrist was performed with and without the administration of intravenous contrast. 7 mL gadobutrol (GADAVIST) 1 MMOL/ML injection. COMPARISON:  None available. CLINICAL HISTORY: Postoperative or volar wrist abscess. FINDINGS: LIMITATIONS/ARTIFACTS: Severe motion artifact prominently lowers diagnostic sensitivity and specificity. INTRINSIC LIGAMENTS: Grossly intact scapholunate and lunotriquetral ligaments without interval widening. No significant periligamentous edema. EXTRINSIC LIGAMENTS: The volar extrinsic ligaments including the radio-scapho-capitate and radio-luno-triquetral ligaments are grossly intact. TFCC: No gross abnormality. EXTENSOR TENDONS: Intact extensor tendons. CARPAL TUNNEL AND FLEXOR TENDONS: Poor visualization of the flexor carpi radialis tendon along the wound, believed to be along the deep margin of the wound. Involvement of the tendon sheath is likely. Other flexor tendons are intact. The flexor retinaculum is intact. Normal MRI appearance of the median nerve. Intact ulnar nerve  in Guyon's canal. JOINT SPACES: No significant joint effusion. Unremarkable alignment. Abnormal marrow edema in the scaphoid and trapezium primarily at the volar portion of the articulation as on image 24 series 41 presumably due to arthropathy with infection as a differential diagnostic consideration particularly given the proximity to the flexor carpi radialis tendon. BONE MARROW: Abnormal marrow edema in the scaphoid and trapezium primarily at the volar portion of the articulation as on image 24 series 41 presumably due to arthropathy with infection as a differential diagnostic consideration particularly given the proximity to the flexor carpi radialis tendon. SOFT TISSUE: Volar wound and nonenhancing cavity presumably representing drained abscess cavity in the vicinity of the flexor carpi radialis tendon at the level of the distal radius with associated cavity measuring 2.1 x 1.1 x 1.6 cm as on image 20 series 44 and image 28 series 41. Surrounding infiltrative edema in the soft tissues with obscuration of soft tissue planes likely reflecting local  cellulitis. . IMPRESSION: 1. Severe motion artifact significantly limits diagnostic sensitivity and specificity. 2. Volar wound with nonenhancing cavity consistent with a drained abscess cavity adjacent to/along the flexor carpi radialis tendon at the distal radius, with surrounding cellulitis and likely involvement of the tendon sheath. 3. Abnormal marrow edema in the scaphoid and trapezium, infection/osteomyelitis not excluded given proximity to the flexor carpi radialis tendon. Electronically signed by: Ryan Salvage MD 12/01/2023 08:24 AM EST RP Workstation: [id number]   CT FOREARM RIGHT W CONTRAST Result Date: 11/30/2023 EXAM: CT RIGHT FOREARM, WITH IV CONTRAST TECHNIQUE: Axial images were acquired through the right forearm with IV contrast. Reformatted images were reviewed. Automated exposure control, iterative reconstruction, and/or weight based adjustment of the mA/kV was utilized to reduce the radiation dose to as low as reasonably achievable. 75 mL of iohexol  (OMNIPAQUE ) 350 MG/ML injection was administered intravenously. COMPARISON: None available. CLINICAL HISTORY: Soft tissue mass, forearm, deep. FINDINGS: BONES AND JOINTS: No acute fracture or focal osseous lesion. No bony destructive findings characteristic of osteomyelitis. No dislocation. The joint spaces are normal. SOFT TISSUES: Severe fusiform expansion and indistinctness of the flexor carpi radialis tendon starting about 5.4 cm proximal to the radiocarpal joint with severe expansion and indistinctness of the distal tendon which is probably torn. Substantial subcutaneous edema and edema along fascial planes surrounding the severely expanded and likely discontinuous distal flexor carpi radialis tendon potentially from inflammation or cellulitis. This tracks into the volar radial wrist region. 0.7 cm metal clip near the right radial artery centered 5.2 cm proximal to the radiocarpal joint. IMPRESSION: 1. Severe fusiform expansion and  indistinctness of the flexor carpi radialis tendon, likely torn distally, with surrounding subcutaneous and fascial edema suggestive of associated soft tissue inflammation or cellulitis. I don't see a well-defined drainable abscess although incipient abscess or spontaneous draining abscess might be considered given the degree of inflammation. 2. No bony destructive findings to suggest osteomyelitis. 3. Incidental 0.7 cm metallic clip near the right radial artery approximately 5.2 cm proximal to the radiocarpal joint. Electronically signed by: Ryan Salvage MD 11/30/2023 11:42 AM EST RP Workstation: [id number]   DG Wrist Complete Right Result Date: 11/30/2023 EXAM: 3 OR MORE VIEW(S) XRAY OF THE RIGHT WRIST 11/30/2023 08:11:00 AM COMPARISON: Right wrist series dated 10/24/2023. CLINICAL HISTORY: abscess abscess FINDINGS: BONES AND JOINTS: No acute fracture. No focal osseous lesion. No joint dislocation. No foreign bodies are evident. SOFT TISSUES: There is moderate diffuse soft tissue swelling. IMPRESSION: 1. Moderate diffuse soft tissue swelling, possibly related to the reported abscess. No  foreign bodies identified. 2. No acute osseous abnormality. Electronically signed by: Evalene Coho MD 11/30/2023 08:43 AM EST RP Workstation: [id number]     The results of significant diagnostics from this hospitalization (including imaging, microbiology, ancillary and laboratory) are listed below for reference.     Microbiology: Recent Results (from the past 240 hours)  Blood culture (routine x 2)     Status: None   Collection Time: 11/30/23  7:43 AM   Specimen: BLOOD RIGHT FOREARM  Result Value Ref Range Status   Specimen Description BLOOD RIGHT FOREARM  Final   Special Requests   Final    BOTTLES DRAWN AEROBIC AND ANAEROBIC Blood Culture results may not be optimal due to an inadequate volume of blood received in culture bottles   Culture   Final    NO GROWTH 5 DAYS Performed at [facility]  Lab, [address], [place], [place] [address]    Report Status 12/05/2023 FINAL  Final  Aerobic Culture w Gram Stain (superficial specimen)     Status: None   Collection Time: 11/30/23  8:55 AM   Specimen: Wound  Result Value Ref Range Status   Specimen Description WOUND  Final   Special Requests NONE  Final   Gram Stain   Final    RARE WBC SEEN MODERATE GRAM POSITIVE COCCI Performed at [facility], [address], [place], [place] [address]    Culture MODERATE STREPTOCOCCUS CONSTELLATUS  Final   Report Status 12/02/2023 FINAL  Final   Organism ID, Bacteria STREPTOCOCCUS CONSTELLATUS  Final      Susceptibility   Streptococcus constellatus - MIC*    PENICILLIN <=0.06 SENSITIVE Sensitive     CEFTRIAXONE  0.25 SENSITIVE Sensitive     ERYTHROMYCIN >=8 RESISTANT Resistant     LEVOFLOXACIN <=0.25 SENSITIVE Sensitive     VANCOMYCIN  0.5 SENSITIVE Sensitive     * MODERATE STREPTOCOCCUS CONSTELLATUS  Aerobic/Anaerobic Culture w Gram Stain (surgical/deep wound)     Status: None   Collection Time: 11/30/23 12:38 PM   Specimen: Path fluid; Body Fluid  Result Value Ref Range Status   Specimen Description FLUID  Final   Special Requests RIGHT FOREARM ABSCESS  Final   Gram Stain   Final    FEW WBC PRESENT, PREDOMINANTLY PMN MODERATE GRAM POSITIVE COCCI    Culture   Final    MODERATE STREPTOCOCCUS CONSTELLATUS SUSCEPTIBILITIES PERFORMED ON PREVIOUS CULTURE WITHIN THE LAST 5 DAYS. NO ANAEROBES ISOLATED Performed at [facility] Lab, [address], [place], [place] [address]    Report Status 12/05/2023 FINAL  Final  Blood culture (routine x 2)     Status: None   Collection Time: 11/30/23  4:37 PM   Specimen: BLOOD LEFT HAND  Result Value Ref Range Status   Specimen Description BLOOD LEFT HAND  Final   Special Requests   Final    BOTTLES DRAWN AEROBIC ONLY Blood Culture results may not be optimal due to an inadequate volume of blood received in culture bottles   Culture   Final     NO GROWTH 5 DAYS Performed at [facility] Lab, [address] [place], [place] [address]    Report Status 12/05/2023 FINAL  Final     Labs: BNP (last 3 results) No results for input(s): BNP in the last 8760 hours. Basic Metabolic Panel: Recent Labs  Lab 12/01/23 0626 12/01/23 2341 12/02/23 0611 12/03/23 0520 12/04/23 0426 12/05/23 0040 12/05/23 0041  NA 135  --  134*  133* 130*  --  138  K 3.8  --  4.0 4.2 5.5*  --  3.9  CL 100  --  99 95* 98  --  99  CO2 25  --  27 20* 16*  --  25  GLUCOSE 221*  --  248* 334* 391*  --  57*  BUN 10  --  16 17 18   --  17  CREATININE 0.74  --  0.85 0.83 0.99  --  0.82  CALCIUM 7.9*  --  8.4* 8.9 9.2  --  9.1  MG  --  1.9  --   --   --  2.0  --    Liver Function Tests: No results for input(s): AST, ALT, ALKPHOS, BILITOT, PROT, ALBUMIN in the last 168 hours. No results for input(s): LIPASE, AMYLASE in the last 168 hours. No results for input(s): AMMONIA in the last 168 hours. CBC: Recent Labs  Lab 11/30/23 0743 12/01/23 0403 12/02/23 0611 12/03/23 0520 12/04/23 0542 12/05/23 0041  WBC 6.7 7.4 5.0 7.6 7.8 5.2  NEUTROABS 4.3 5.0  --   --   --   --   HGB 11.6* 11.9* 11.4* 13.8 13.6 13.2  HCT 35.3* 35.2* 34.4* 41.7 41.4 40.5  MCV 89.4 89.1 88.7 88.2 87.9 88.4  PLT 284 281 268 267 491* 438*   Cardiac Enzymes: No results for input(s): CKTOTAL, CKMB, CKMBINDEX, TROPONINI in the last 168 hours. BNP: Invalid input(s): POCBNP CBG: Recent Labs  Lab 12/04/23 0820 12/04/23 1243 12/04/23 1702 12/04/23 2113 12/05/23 0831  GLUCAP 363* 218* 145* 88 70   D-Dimer No results for input(s): DDIMER in the last 72 hours. Hgb A1c No results for input(s): HGBA1C in the last 72 hours. Lipid Profile No results for input(s): CHOL, HDL, LDLCALC, TRIG, CHOLHDL, LDLDIRECT in the last 72 hours. Thyroid function studies No results for input(s): TSH, T4TOTAL, T3FREE, THYROIDAB in the last 72  hours.  Invalid input(s): FREET3 Anemia work up No results for input(s): VITAMINB12, FOLATE, FERRITIN, TIBC, IRON, RETICCTPCT in the last 72 hours. Urinalysis    Component Value Date/Time   COLORURINE YELLOW 10/25/2023 2230   APPEARANCEUR CLEAR 10/25/2023 2230   LABSPEC 1.036 (H) 10/25/2023 2230   PHURINE 6.0 10/25/2023 2230   GLUCOSEU >=500 (A) 10/25/2023 2230   HGBUR NEGATIVE 10/25/2023 2230   BILIRUBINUR NEGATIVE 10/25/2023 2230   KETONESUR NEGATIVE 10/25/2023 2230   PROTEINUR NEGATIVE 10/25/2023 2230   UROBILINOGEN 1.0 03/16/2015 1713   NITRITE NEGATIVE 10/25/2023 2230   LEUKOCYTESUR NEGATIVE 10/25/2023 2230   Sepsis Labs Recent Labs  Lab 12/02/23 0611 12/03/23 0520 12/04/23 0542 12/05/23 0041  WBC 5.0 7.6 7.8 5.2   Microbiology Recent Results (from the past 240 hours)  Blood culture (routine x 2)     Status: None   Collection Time: 11/30/23  7:43 AM   Specimen: BLOOD RIGHT FOREARM  Result Value Ref Range Status   Specimen Description BLOOD RIGHT FOREARM  Final   Special Requests   Final    BOTTLES DRAWN AEROBIC AND ANAEROBIC Blood Culture results may not be optimal due to an inadequate volume of blood received in culture bottles   Culture   Final    NO GROWTH 5 DAYS Performed at [facility] Lab, [address] [place], [place] [address]    Report Status 12/05/2023 FINAL  Final  Aerobic Culture w Gram Stain (superficial specimen)     Status: None   Collection Time: 11/30/23  8:55 AM  Specimen: Wound  Result Value Ref Range Status   Specimen Description WOUND  Final   Special Requests NONE  Final   Gram Stain   Final    RARE WBC SEEN MODERATE GRAM POSITIVE COCCI Performed at [facility], [address] [place], [place] [address]    Culture MODERATE STREPTOCOCCUS CONSTELLATUS  Final   Report Status 12/02/2023 FINAL  Final   Organism ID, Bacteria STREPTOCOCCUS CONSTELLATUS  Final      Susceptibility   Streptococcus constellatus -  MIC*    PENICILLIN <=0.06 SENSITIVE Sensitive     CEFTRIAXONE  0.25 SENSITIVE Sensitive     ERYTHROMYCIN >=8 RESISTANT Resistant     LEVOFLOXACIN <=0.25 SENSITIVE Sensitive     VANCOMYCIN  0.5 SENSITIVE Sensitive     * MODERATE STREPTOCOCCUS CONSTELLATUS  Aerobic/Anaerobic Culture w Gram Stain (surgical/deep wound)     Status: None   Collection Time: 11/30/23 12:38 PM   Specimen: Path fluid; Body Fluid  Result Value Ref Range Status   Specimen Description FLUID  Final   Special Requests RIGHT FOREARM ABSCESS  Final   Gram Stain   Final    FEW WBC PRESENT, PREDOMINANTLY PMN MODERATE GRAM POSITIVE COCCI    Culture   Final    MODERATE STREPTOCOCCUS CONSTELLATUS SUSCEPTIBILITIES PERFORMED ON PREVIOUS CULTURE WITHIN THE LAST 5 DAYS. NO ANAEROBES ISOLATED Performed at [facility] Lab, [address] [place], [place] [address]    Report Status 12/05/2023 FINAL  Final  Blood culture (routine x 2)     Status: None   Collection Time: 11/30/23  4:37 PM   Specimen: BLOOD LEFT HAND  Result Value Ref Range Status   Specimen Description BLOOD LEFT HAND  Final   Special Requests   Final    BOTTLES DRAWN AEROBIC ONLY Blood Culture results may not be optimal due to an inadequate volume of blood received in culture bottles   Culture   Final    NO GROWTH 5 DAYS Performed at [facility], [address], [place], [place] [address]    Report Status 12/05/2023 FINAL  Final     Time coordinating discharge:  I have spent 35 minutes face to face with the patient and on the ward discussing the patients care, assessment, plan and disposition with other care givers. >50% of the time was devoted counseling the patient about the risks and benefits of treatment/Discharge disposition and coordinating care.   SIGNED:   Burgess JAYSON Dare, MD  [facility] 12/05/2023, 12:33 PM   If 7PM-7AM, please contact night-coverage

## 2023-12-05 NOTE — Progress Notes (Signed)
 Discharge information reviewed with patient by discharge nurse. TOC medications picked up. Patient leaving via private auto.

## 2023-12-05 NOTE — Progress Notes (Signed)
 Patient's dressing changed and she did assist with it. May need some further education.

## 2023-12-08 ENCOUNTER — Telehealth: Payer: Self-pay

## 2023-12-08 NOTE — Transitions of Care (Post Inpatient/ED Visit) (Signed)
   12/08/2023  Name: Anna Dennis MRN: [id number] DOB: [birth year]  Today's TOC FU Call Status: Today's TOC FU Call Status:: Unsuccessful Call (1st Attempt) Unsuccessful Call (1st Attempt) Date: 12/08/23  Attempted to reach the patient regarding the most recent Inpatient/ED visit.  Follow Up Plan: Additional outreach attempts will be made to reach the patient to complete the Transitions of Care (Post Inpatient/ED visit) call.   Arvin Seip RN, BSN, CCM [employer], Population Health Case Manager Phone: [phone]

## 2023-12-09 ENCOUNTER — Telehealth: Payer: Self-pay | Admitting: *Deleted

## 2023-12-09 NOTE — Transitions of Care (Post Inpatient/ED Visit) (Signed)
 12/09/2023  Name: Anna Dennis MRN: [id number] DOB: 13-Sep-1990  Today's TOC FU Call Status: Today's TOC FU Call Status:: Successful TOC FU Call Completed TOC FU Call Complete Date: 12/09/23  Patient's Name and Date of Birth confirmed. Name, DOB  Transition Care Management Follow-up Telephone Call Date of Discharge: 12/05/23 Discharge Facility: [facility]) Type of Discharge: Inpatient Admission Primary Inpatient Discharge Diagnosis:: Abscess of skin of right wrist How have you been since you were released from the hospital?: Better Any questions or concerns?: No  Items Reviewed: Did you receive and understand the discharge instructions provided?: Yes Medications obtained,verified, and reconciled?: Yes (Medications Reviewed) Any new allergies since your discharge?: No Dietary orders reviewed?: No Do you have support at home?: Yes People in Home [RPT]: parent(s) Name of Support/Comfort Primary Source: janelle  Medications Reviewed Today: Medications Reviewed Today     Reviewed by Kennieth Cathlean DEL, RN (Case Manager) on 12/09/23 at 1143  Med List Status: <None>   Medication Order Taking? Sig Documenting Provider Last Dose Status Informant  acetaminophen  (TYLENOL ) 500 MG tablet [id number] Yes Take 1,000 mg by mouth every 6 (six) hours as needed (pain). [provider]  Active Pharmacy Records, Self  amoxicillin  (AMOXIL ) 500 MG capsule [id number] Yes Take 2 capsules (1,000 mg total) by mouth every 8 (eight) hours for 17 days. Caleen Burgess BROCKS, MD  Active   escitalopram (LEXAPRO) 10 MG tablet [id number] Yes Take 3 tablets (30 mg total) by mouth daily. Caleen Burgess BROCKS, MD  Active   gabapentin (NEURONTIN) 300 MG capsule [id number] Yes Take 1 capsule (300 mg total) by mouth 3 (three) times daily. Caleen Burgess BROCKS, MD  Active   hydrALAZINE (APRESOLINE) 25 MG tablet [id number]  Take 1 tablet (25 mg total) by mouth 3 (three) times daily.  Patient not taking: Reported on 12/09/2023    Leotis Bogus, MD  Active Pharmacy Records, Self           Med Note (COFFELL, JON CHRISTELLA Kitchens Dec 01, 2023  7:56 AM) Patient states she went into rehab not long after starting hydralazine. She states she was changed to a different BP medication (she thinks was losartan ) while in rehab. She has not restarted hydralazine since rehab discharge - she was not given any of the other BP medication after rehab discharge.  ibuprofen  (ADVIL ) 200 MG tablet [id number] Yes Take 400-800 mg by mouth every 6 (six) hours as needed (pain). [provider]  Active Pharmacy Records, Self    Discontinued 02/28/12 1405   Insulin  Glargine (BASAGLAR  KWIKPEN) 100 UNIT/ML [id number] Yes Inject 50 Units into the skin at bedtime. Caleen Burgess BROCKS, MD  Active   insulin  lispro (HUMALOG  KWIKPEN) 100 UNIT/ML KwikPen [id number] Yes Inject 4-30 Units into the skin 3 (three) times daily before meals. Fausto Burnard LABOR, DO  Active Pharmacy Records, Self           Med Note (COFFELL, JON CHRISTELLA   Mon Dec 01, 2023  7:53 AM) Patient unable to provide exact information of current sliding scale.  Insulin  Pen Needle (MM PEN NEEDLES) 32G X 4 MM MISC [id number] Yes To be used with insulin  as instructed Fausto Burnard LABOR, DO  Active Pharmacy Records, Self  losartan  (COZAAR ) 25 MG tablet [id number] Yes Take 1 tablet (25 mg total) by mouth daily. Caleen Burgess BROCKS, MD  Active   methadone  (DOLOPHINE ) 10 MG/ML solution [id number] Yes Take 40-70 mg by mouth See admin instructions. Total daily dose of 110mg . Take  7mL (70mg ) by mouth every morning and take 4mL (40mg ) every afternoon. [provider]  Active Self, Pharmacy Records           Med Note (COFFELL, JON CHRISTELLA Kitchens Dec 01, 2023  8:38 AM) Dose confirmed per [place] ([facility] dosing department). Patient last dosed in-clinic on 11/1 with 70mg  (7mL) and receiving 3 take home doses to cover through Sunday (11/2), expected back in clinic on 11/3 - take home doses received : afternoon dose of 4mL (40mg )  for 11/1, 7mL (70mg ) dose for AM 11/2 and 4mL (40mg ) dose for PM 11/2.  nicotine  (NICODERM CQ  - DOSED IN MG/24 HOURS) 21 mg/24hr patch [id number] Yes Place 1 patch (21 mg total) onto the skin daily. Caleen Burgess BROCKS, MD  Active     Discontinued 03/04/12 1921   oxyCODONE  (OXY IR/ROXICODONE ) 5 MG immediate release tablet [id number] Yes Take 1 tablet (5 mg total) by mouth every 8 (eight) hours as needed for severe pain (pain score 7-10). Caleen Burgess BROCKS, MD  Active   senna (SENOKOT) 8.6 MG TABS tablet [id number] Yes Take 2 tablets (17.2 mg total) by mouth 2 (two) times daily as needed for moderate constipation. Caleen Burgess BROCKS, MD  Active   Med List Note Geri Angela M, CPhT 12/01/23 9251): [facility] (methadone ) - [phone]            Home Care and Equipment/Supplies: Were Home Health Services Ordered?: NA Any new equipment or medical supplies ordered?: NA  Functional Questionnaire: Do you need assistance with bathing/showering or dressing?: No Do you need assistance with meal preparation?: No Do you need assistance with eating?: No Do you have difficulty maintaining continence: No Do you need assistance with getting out of bed/getting out of a chair/moving?: No Do you have difficulty managing or taking your medications?: No  Follow up appointments reviewed: PCP Follow-up appointment confirmed?: No MD Provider Line Number:[phone] Given: Yes (Per patient she is going to call for an appointment) Specialist Hospital Follow-up appointment confirmed?: Yes Date of Specialist follow-up appointment?: 12/19/23 Follow-Up Specialty Provider:: Abscess of skin of right wrist Do you need transportation to your follow-up appointment?: No Do you understand care options if your condition(s) worsen?: Yes-patient verbalized understanding  SDOH Interventions Today    Flowsheet Row Most Recent Value  SDOH Interventions   Food Insecurity Interventions Inpatient TOC  [inpatien TOC  gave resource information]  Housing Interventions Intervention Not Indicated  Transportation Interventions Inpatient TOC, Community Resources Provided  Utilities Interventions Intervention Not Indicated  Depression Interventions/Treatment  Medication   Discussed and offered 30 day TOC program.  Patient   declined.  The patient has been provided with contact information for the care management team and has been advised to call with any health -related questions or concerns.  The patient verbalized understanding with current plan of care.  The patient is directed to their insurance card regarding availability of benefits coverage    Cathlean Headland BSN RN [facility] Care Management Coordinator [email or web address] Direct Dial : [phone]  Fax: [phone] Website: [email or web address]

## 2023-12-17 ENCOUNTER — Ambulatory Visit: Payer: Self-pay | Admitting: Nurse Practitioner

## 2023-12-19 ENCOUNTER — Ambulatory Visit: Payer: MEDICAID | Admitting: Internal Medicine

## 2024-02-12 ENCOUNTER — Encounter: Payer: Self-pay | Admitting: Nurse Practitioner

## 2024-02-12 ENCOUNTER — Ambulatory Visit: Payer: MEDICAID | Admitting: Nurse Practitioner

## 2024-02-12 VITALS — BP 131/88 | HR 82 | Temp 97.8°F | Wt 156.0 lb

## 2024-02-12 DIAGNOSIS — E1065 Type 1 diabetes mellitus with hyperglycemia: Secondary | ICD-10-CM | POA: Diagnosis not present

## 2024-02-12 LAB — POCT GLYCOSYLATED HEMOGLOBIN (HGB A1C): Hemoglobin A1C: 12.8 % — AB (ref 4.0–5.6)

## 2024-02-12 MED ORDER — FLUCONAZOLE 150 MG PO TABS: 150.0000 mg | ORAL_TABLET | Freq: Once | ORAL | 0 refills | Status: AC

## 2024-02-12 MED ORDER — LOSARTAN POTASSIUM 25 MG PO TABS: 25.0000 mg | ORAL_TABLET | Freq: Every day | ORAL | 0 refills | Status: AC

## 2024-02-12 MED ORDER — INSULIN LISPRO (1 UNIT DIAL) 100 UNIT/ML (KWIKPEN): 4.0000 [IU] | PEN_INJECTOR | Freq: Three times a day (TID) | SUBCUTANEOUS | 1 refills | Status: AC

## 2024-02-12 MED ORDER — ESCITALOPRAM OXALATE 10 MG PO TABS: 30.0000 mg | ORAL_TABLET | Freq: Every day | ORAL | 0 refills | Status: AC

## 2024-02-12 MED ORDER — BASAGLAR KWIKPEN 100 UNIT/ML ~~LOC~~ SOPN: 50.0000 [IU] | PEN_INJECTOR | Freq: Every day | SUBCUTANEOUS | 0 refills | Status: AC

## 2024-02-12 NOTE — Progress Notes (Signed)
 "  Subjective   Patient ID: Anna Dennis, female    DOB: Sep 12, 1990, 34 y.o.   MRN: [id number]  Chief Complaint  Patient presents with   Diabetes    Need med refills until seen with endocrinologist. Need something for yeast infection due to taking abx for abscess.     Referring provider: Oley Bascom RAMAN, NP  Anna Dennis is a 34 y.o. 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 Referral placed to pharmacy for medication management. Awaiting appointment with endocrinology for further evaluation and treatment.  She does need a refill on her Lantus  and short acting insulin .  Will place refills today.   Trying to get into getting into a drug rehab center.  She is very sleepy and slurring her speech in the office today.  Denies f/c/s, n/v/d, hemoptysis, PND, leg swelling Denies chest pain or edema.  Note: Patient states that she does need Diflucan  for current yeast infection due to recently taking antibiotics for abscess to her right wrist.  Allergies[1]  Immunization History  Administered Date(s) Administered   Hepatitis B, ADULT 10/25/2014, 12/29/2014, 05/01/2015   Influenza Split 02/22/2012   Influenza,inj,Quad PF,6+ Mos 10/25/2014   Influenza,inj,Quad PF,6-35 Mos 12/02/2017   PFIZER(Purple Top)SARS-COV-2 Vaccination 11/23/2019, 12/14/2019   Pneumococcal Polysaccharide-23 12/02/2017    Tobacco History: Tobacco Use History[2] Ready to quit: Not Answered Counseling given: Not Answered   Outpatient Encounter Medications as of 02/12/2024  Medication Sig   acetaminophen  (TYLENOL ) 500 MG tablet Take 1,000 mg by mouth  every 6 (six) hours as needed (pain).   fluconazole  (DIFLUCAN ) 150 MG tablet Take 1 tablet (150 mg total) by mouth once for 1 dose.   ibuprofen  (ADVIL ) 200 MG tablet Take 400-800 mg by mouth every 6 (six) hours as needed (pain).   methadone  (DOLOPHINE ) 10 MG/ML solution Take 40-70 mg by mouth See admin instructions. Total daily dose of 110mg . Take 7mL (70mg ) by mouth every morning and take 4mL (40mg ) every afternoon.   escitalopram  (LEXAPRO ) 10 MG tablet Take 3 tablets (30 mg total) by mouth daily.   gabapentin  (NEURONTIN ) 300 MG capsule Take 1 capsule (300 mg total) by mouth 3 (three) times daily. (Patient not taking: Reported on 02/12/2024)   hydrALAZINE  (APRESOLINE ) 25 MG tablet Take 1 tablet (25 mg total) by mouth 3 (three) times daily. (Patient not taking: Reported on 02/12/2024)   Insulin  Glargine (BASAGLAR  KWIKPEN) 100 UNIT/ML Inject 50 Units into the skin at bedtime.   insulin  lispro (HUMALOG  KWIKPEN) 100 UNIT/ML KwikPen Inject 4-30 Units into the skin 3 (three) times daily before meals.   Insulin  Pen Needle (MM PEN NEEDLES) 32G X 4 MM MISC To be used with insulin  as instructed (Patient not taking: Reported on 02/12/2024)   losartan  (COZAAR ) 25 MG tablet Take 1 tablet (25 mg total) by mouth daily.   nicotine  (NICODERM CQ  - DOSED IN MG/24 HOURS) 21 mg/24hr patch Place 1 patch (21 mg total) onto the skin daily. (Patient not taking: Reported on 02/12/2024)   oxyCODONE  (OXY IR/ROXICODONE ) 5 MG immediate release tablet Take 1 tablet (5 mg total) by mouth every 8 (eight) hours as needed  for severe pain (pain score 7-10). (Patient not taking: Reported on 02/12/2024)   senna (SENOKOT) 8.6 MG TABS tablet Take 2 tablets (17.2 mg total) by mouth 2 (two) times daily as needed for moderate constipation. (Patient not taking: Reported on 02/12/2024)   [DISCONTINUED] escitalopram  (LEXAPRO ) 10 MG tablet Take 3 tablets (30 mg total) by mouth daily. (Patient not taking: Reported on 02/12/2024)   [DISCONTINUED] insulin   aspart (NOVOLOG ) 100 UNIT/ML injection Inject 6 Units into the skin 3 (three) times daily with meals.   [DISCONTINUED] Insulin  Glargine (BASAGLAR  KWIKPEN) 100 UNIT/ML Inject 50 Units into the skin at bedtime.   [DISCONTINUED] insulin  lispro (HUMALOG  KWIKPEN) 100 UNIT/ML KwikPen Inject 4-30 Units into the skin 3 (three) times daily before meals.   [DISCONTINUED] losartan  (COZAAR ) 25 MG tablet Take 1 tablet (25 mg total) by mouth daily. (Patient not taking: Reported on 02/12/2024)   [DISCONTINUED] Omeprazole  20 MG TBEC Take 1 tablet (20 mg total) by mouth daily.   No facility-administered encounter medications on file as of 02/12/2024.    Review of Systems  Review of Systems  Constitutional: Negative.   HENT: Negative.    Cardiovascular: Negative.   Gastrointestinal: Negative.   Allergic/Immunologic: Negative.   Neurological: Negative.   Psychiatric/Behavioral: Negative.       Objective:   BP 131/88   Pulse 82   Temp 97.8 F (36.6 C) (Temporal)   Wt 156 lb (70.8 kg)   SpO2 100%   BMI 24.43 kg/m   Wt Readings from Last 5 Encounters:  02/12/24 156 lb (70.8 kg)  11/30/23 160 lb (72.6 kg)  10/24/23 157 lb 13.6 oz (71.6 kg)  07/18/23 157 lb 14.4 oz (71.6 kg)  05/02/23 158 lb 3.2 oz (71.8 kg)     Physical Exam Vitals and nursing note reviewed.  Constitutional:      General: She is not in acute distress.    Appearance: She is well-developed.  Cardiovascular:     Rate and Rhythm: Normal rate and regular rhythm.  Pulmonary:     Effort: Pulmonary effort is normal.     Breath sounds: Normal breath sounds.  Neurological:     Mental Status: She is alert and oriented to person, place, and time.       Assessment & Plan:   Type 1 diabetes mellitus with hyperglycemia (HCC) -     POCT glycosylated hemoglobin (Hb A1C) -     Basaglar  KwikPen; Inject 50 Units into the skin at bedtime.  Dispense: 15 mL; Refill: 0 -     Insulin  Lispro (1 Unit Dial ); Inject 4-30 Units into the skin  3 (three) times daily before meals.  Dispense: 15 mL; Refill: 1 -     AMB Referral [facility] Care Management -     Fluconazole ; Take 1 tablet (150 mg total) by mouth once for 1 dose.  Dispense: 1 tablet; Refill: 0  Other orders -     Losartan  Potassium; Take 1 tablet (25 mg total) by mouth daily.  Dispense: 90 tablet; Refill: 0 -     Escitalopram  Oxalate; Take 3 tablets (30 mg total) by mouth daily.  Dispense: 90 tablet; Refill: 0     Return in about 3 months (around 05/12/2024).     Bascom GORMAN Borer, NP 02/12/2024     [1] No Known Allergies [2]  Social History Tobacco Use  Smoking Status Every Day   Types: Cigarettes  Smokeless Tobacco Never   "

## 2024-02-13 ENCOUNTER — Other Ambulatory Visit: Payer: Self-pay

## 2024-02-13 MED ORDER — ESCITALOPRAM OXALATE 20 MG PO TABS: 20.0000 mg | ORAL_TABLET | Freq: Every day | ORAL | 2 refills | Status: AC

## 2024-02-13 MED ORDER — ESCITALOPRAM OXALATE 10 MG PO TABS: 10.0000 mg | ORAL_TABLET | Freq: Every day | ORAL | 2 refills | Status: AC

## 2024-02-13 NOTE — Telephone Encounter (Signed)
 Copied from CRM [id number]. Topic: Clinical - Prescription Issue >> Feb 13, 2024 11:57 AM Sophia H wrote: Reason for CRM: Patient states Insurance won't fill her RX for escitalopram  (LEXAPRO ) 10MG .   States she was advised provider needs to write the prescription differently, Separate since patient is supposed to take 30MG  per day /  RX for 20MG  & RX for 10MG .    [id number] - [place], [place] - [address]  Please advise.

## 2024-02-18 ENCOUNTER — Encounter: Payer: Self-pay | Admitting: Nurse Practitioner

## 2024-02-25 ENCOUNTER — Other Ambulatory Visit: Payer: MEDICAID

## 2024-02-25 NOTE — Patient Instructions (Signed)
 Visit Information  Thank you for taking time to visit with me today. Please don't hesitate to contact me if I can be of assistance to you before our next scheduled appointment.  Our next appointment is by telephone on 02/26/24 at 10:30am Please call the care guide team at [phone] if you need to cancel or reschedule your appointment.    Please call the Suicide and Crisis Lifeline: 988 call the [place]  National Suicide Prevention Lifeline: (365)881-9113 or TTY: 763-404-4749 TTY 682-831-4889) to talk to a trained counselor call 1-800-273-TALK (toll free, 24 hour hotline) go to [facility] [address], [place] [phone]) call 911 if you are experiencing a Mental Health or Behavioral Health Crisis or need someone to talk to.  Patient verbalized understanding of Care plan and visit instructions communicated this visit  Orlean Fey, BSW [facility]  [facility] Social Worker, [facility] [phone]

## 2024-02-25 NOTE — Patient Outreach (Signed)
 BSW outreached patient to connect them with their Futures trader and [employer]. BSW submitted a referral prior to call to get information of care manager from [place], [place] did not receive information till after the scheduled call with patient. Patient stated that she has not yet been in contact with care manager. BSW will follow up with patient on 1/29 with information of care manager.  Orlean Fey, BSW [place]  [facility] Social Worker, [facility] [phone]

## 2024-02-26 ENCOUNTER — Other Ambulatory Visit: Payer: MEDICAID

## 2024-02-26 NOTE — Patient Instructions (Signed)

## 2024-02-26 NOTE — Patient Outreach (Signed)
 BSW outreached patient today to follow up on care management services with [facility]. BSW received an email indicating that hospital liaison Burnard Britain will be following up regarding the referral, as the patient had not yet provided consent at the time of referral. [facility] will be following up with the patient to obtain consent. BSW informed the patient of this update, provided the [facility] contact number, and advised the patient to be on the lookout for a call from [facility]. BSW confirmed that the patient has been connected with the agency. BSW informed the patient to reach out if any questions or concerns arise. BSW will close the case at this time.  Orlean Fey, BSW [place]  [facility] Social Worker, [facility] [phone]

## 2024-03-11 ENCOUNTER — Other Ambulatory Visit: Payer: Self-pay

## 2024-04-13 ENCOUNTER — Ambulatory Visit: Payer: Self-pay

## 2024-05-12 ENCOUNTER — Ambulatory Visit: Payer: Self-pay | Admitting: Nurse Practitioner

## 2024-05-17 ENCOUNTER — Ambulatory Visit: Payer: MEDICAID | Admitting: Nurse Practitioner

## 2024-06-07 ENCOUNTER — Ambulatory Visit: Payer: MEDICAID | Admitting: Endocrinology
